# Patient Record
Sex: Female | Born: 1950
Health system: Southern US, Community
[De-identification: ages and names within clinical notes are randomized; demographics above are authoritative.]

## PROBLEM LIST (undated history)

## (undated) DIAGNOSIS — D126 Benign neoplasm of colon, unspecified: Secondary | ICD-10-CM

## (undated) DIAGNOSIS — K449 Diaphragmatic hernia without obstruction or gangrene: Secondary | ICD-10-CM

## (undated) DIAGNOSIS — R200 Anesthesia of skin: Secondary | ICD-10-CM

## (undated) DIAGNOSIS — M25559 Pain in unspecified hip: Secondary | ICD-10-CM

## (undated) DIAGNOSIS — M797 Fibromyalgia: Secondary | ICD-10-CM

## (undated) DIAGNOSIS — M79606 Pain in leg, unspecified: Secondary | ICD-10-CM

## (undated) DIAGNOSIS — G4730 Sleep apnea, unspecified: Secondary | ICD-10-CM

## (undated) DIAGNOSIS — I4891 Unspecified atrial fibrillation: Secondary | ICD-10-CM

## (undated) DIAGNOSIS — K5900 Constipation, unspecified: Secondary | ICD-10-CM

## (undated) DIAGNOSIS — K219 Gastro-esophageal reflux disease without esophagitis: Secondary | ICD-10-CM

## (undated) DIAGNOSIS — K829 Disease of gallbladder, unspecified: Secondary | ICD-10-CM

## (undated) DIAGNOSIS — D509 Iron deficiency anemia, unspecified: Principal | ICD-10-CM

## (undated) DIAGNOSIS — K5790 Diverticulosis of intestine, part unspecified, without perforation or abscess without bleeding: Secondary | ICD-10-CM

## (undated) DIAGNOSIS — H401290 Low-tension glaucoma, unspecified eye, stage unspecified: Secondary | ICD-10-CM

## (undated) DIAGNOSIS — N83209 Unspecified ovarian cyst, unspecified side: Secondary | ICD-10-CM

## (undated) DIAGNOSIS — K644 Residual hemorrhoidal skin tags: Secondary | ICD-10-CM

## (undated) DIAGNOSIS — R7303 Prediabetes: Secondary | ICD-10-CM

## (undated) DIAGNOSIS — K649 Unspecified hemorrhoids: Secondary | ICD-10-CM

## (undated) DIAGNOSIS — M1990 Unspecified osteoarthritis, unspecified site: Secondary | ICD-10-CM

## (undated) DIAGNOSIS — G5790 Unspecified mononeuropathy of unspecified lower limb: Secondary | ICD-10-CM

## (undated) DIAGNOSIS — H409 Unspecified glaucoma: Secondary | ICD-10-CM

## (undated) DIAGNOSIS — M549 Dorsalgia, unspecified: Secondary | ICD-10-CM

## (undated) DIAGNOSIS — I499 Cardiac arrhythmia, unspecified: Secondary | ICD-10-CM

## (undated) DIAGNOSIS — K589 Irritable bowel syndrome without diarrhea: Secondary | ICD-10-CM

## (undated) DIAGNOSIS — R600 Localized edema: Secondary | ICD-10-CM

## (undated) HISTORY — DX: Unspecified ovarian cyst, unspecified side: N83.209

## (undated) HISTORY — PX: PARTIAL HYSTERECTOMY: SHX80

## (undated) HISTORY — DX: Unspecified hemorrhoids: K64.9

## (undated) HISTORY — DX: Constipation, unspecified: K59.00

## (undated) HISTORY — DX: Iron deficiency anemia, unspecified: D50.9

## (undated) HISTORY — DX: Unspecified mononeuropathy of unspecified lower limb: G57.90

## (undated) HISTORY — DX: Disease of gallbladder, unspecified: K82.9

## (undated) HISTORY — PX: OTHER SURGICAL HISTORY: SHX169

## (undated) HISTORY — PX: OOPHORECTOMY: SHX86

## (undated) HISTORY — DX: Cardiac arrhythmia, unspecified: I49.9

## (undated) HISTORY — DX: Dorsalgia, unspecified: M54.9

## (undated) HISTORY — DX: Diverticulosis of intestine, part unspecified, without perforation or abscess without bleeding: K57.90

## (undated) HISTORY — PX: KNEE SURGERY: SHX244

## (undated) HISTORY — DX: Pain in unspecified hip: M25.559

## (undated) HISTORY — DX: Pain in leg, unspecified: M79.606

## (undated) HISTORY — DX: Gastro-esophageal reflux disease without esophagitis: K21.9

## (undated) HISTORY — DX: Unspecified atrial fibrillation: I48.91

## (undated) HISTORY — DX: Diaphragmatic hernia without obstruction or gangrene: K44.9

## (undated) HISTORY — DX: Benign neoplasm of colon, unspecified: D12.6

## (undated) HISTORY — DX: Irritable bowel syndrome, unspecified: K58.9

## (undated) HISTORY — DX: Unspecified osteoarthritis, unspecified site: M19.90

## (undated) HISTORY — DX: Localized edema: R60.0

## (undated) HISTORY — DX: Anesthesia of skin: R20.0

## (undated) HISTORY — DX: Prediabetes: R73.03

## (undated) HISTORY — PX: CARPAL TUNNEL RELEASE: SHX101

## (undated) HISTORY — PX: CHOLECYSTECTOMY: SHX55

## (undated) HISTORY — DX: Unspecified glaucoma: H40.9

## (undated) HISTORY — DX: Residual hemorrhoidal skin tags: K64.4

## (undated) HISTORY — DX: Fibromyalgia: M79.7

## (undated) HISTORY — PX: CATARACT EXTRACTION: SUR2

---

## 1997-07-11 ENCOUNTER — Ambulatory Visit (HOSPITAL_COMMUNITY): Admission: RE | Admit: 1997-07-11 | Discharge: 1997-07-11 | Payer: Self-pay | Admitting: Neurosurgery

## 1997-10-05 ENCOUNTER — Ambulatory Visit (HOSPITAL_COMMUNITY): Admission: RE | Admit: 1997-10-05 | Discharge: 1997-10-05 | Payer: Self-pay | Admitting: Neurosurgery

## 2000-08-24 ENCOUNTER — Ambulatory Visit (HOSPITAL_COMMUNITY): Admission: RE | Admit: 2000-08-24 | Discharge: 2000-08-24 | Payer: Self-pay | Admitting: Internal Medicine

## 2000-11-19 ENCOUNTER — Emergency Department (HOSPITAL_COMMUNITY): Admission: EM | Admit: 2000-11-19 | Discharge: 2000-11-20 | Payer: Self-pay | Admitting: Emergency Medicine

## 2000-11-20 ENCOUNTER — Encounter: Payer: Self-pay | Admitting: Emergency Medicine

## 2000-11-20 DIAGNOSIS — N83209 Unspecified ovarian cyst, unspecified side: Secondary | ICD-10-CM

## 2000-11-20 HISTORY — DX: Unspecified ovarian cyst, unspecified side: N83.209

## 2001-03-06 ENCOUNTER — Ambulatory Visit: Admission: RE | Admit: 2001-03-06 | Discharge: 2001-03-06 | Payer: Self-pay | Admitting: Gynecology

## 2001-03-08 ENCOUNTER — Encounter: Payer: Self-pay | Admitting: Gynecology

## 2001-03-12 ENCOUNTER — Ambulatory Visit (HOSPITAL_COMMUNITY): Admission: RE | Admit: 2001-03-12 | Discharge: 2001-03-12 | Payer: Self-pay | Admitting: Gynecology

## 2001-03-12 ENCOUNTER — Encounter (INDEPENDENT_AMBULATORY_CARE_PROVIDER_SITE_OTHER): Payer: Self-pay | Admitting: Specialist

## 2001-08-15 ENCOUNTER — Encounter: Payer: Self-pay | Admitting: Internal Medicine

## 2001-08-15 ENCOUNTER — Ambulatory Visit (HOSPITAL_COMMUNITY): Admission: RE | Admit: 2001-08-15 | Discharge: 2001-08-15 | Payer: Self-pay | Admitting: Internal Medicine

## 2001-08-28 ENCOUNTER — Ambulatory Visit: Admission: RE | Admit: 2001-08-28 | Discharge: 2001-08-28 | Payer: Self-pay | Admitting: Internal Medicine

## 2001-12-02 ENCOUNTER — Ambulatory Visit: Admission: RE | Admit: 2001-12-02 | Discharge: 2001-12-02 | Payer: Self-pay | Admitting: Internal Medicine

## 2003-02-28 HISTORY — PX: COLONOSCOPY: SHX174

## 2003-03-13 ENCOUNTER — Ambulatory Visit (HOSPITAL_COMMUNITY): Admission: RE | Admit: 2003-03-13 | Discharge: 2003-03-13 | Payer: Self-pay | Admitting: Internal Medicine

## 2003-03-17 ENCOUNTER — Ambulatory Visit (HOSPITAL_COMMUNITY): Admission: RE | Admit: 2003-03-17 | Discharge: 2003-03-17 | Payer: Self-pay | Admitting: Otolaryngology

## 2003-06-26 ENCOUNTER — Emergency Department (HOSPITAL_COMMUNITY): Admission: EM | Admit: 2003-06-26 | Discharge: 2003-06-27 | Payer: Self-pay | Admitting: *Deleted

## 2003-08-14 ENCOUNTER — Ambulatory Visit (HOSPITAL_COMMUNITY)
Admission: RE | Admit: 2003-08-14 | Discharge: 2003-08-14 | Payer: Self-pay | Admitting: Physical Medicine and Rehabilitation

## 2003-10-21 ENCOUNTER — Ambulatory Visit (HOSPITAL_COMMUNITY): Admission: RE | Admit: 2003-10-21 | Discharge: 2003-10-21 | Payer: Self-pay | Admitting: Internal Medicine

## 2004-10-13 ENCOUNTER — Other Ambulatory Visit: Admission: RE | Admit: 2004-10-13 | Discharge: 2004-10-13 | Payer: Self-pay | Admitting: Dermatology

## 2005-01-16 ENCOUNTER — Other Ambulatory Visit: Admission: RE | Admit: 2005-01-16 | Discharge: 2005-01-16 | Payer: Self-pay | Admitting: Dermatology

## 2005-07-21 ENCOUNTER — Ambulatory Visit (HOSPITAL_COMMUNITY): Admission: RE | Admit: 2005-07-21 | Discharge: 2005-07-21 | Payer: Self-pay | Admitting: Internal Medicine

## 2005-10-09 ENCOUNTER — Ambulatory Visit (HOSPITAL_COMMUNITY): Admission: RE | Admit: 2005-10-09 | Discharge: 2005-10-09 | Payer: Self-pay | Admitting: *Deleted

## 2006-02-06 ENCOUNTER — Ambulatory Visit (HOSPITAL_COMMUNITY): Admission: RE | Admit: 2006-02-06 | Discharge: 2006-02-06 | Payer: Self-pay | Admitting: Internal Medicine

## 2006-04-16 ENCOUNTER — Ambulatory Visit (HOSPITAL_COMMUNITY): Admission: RE | Admit: 2006-04-16 | Discharge: 2006-04-16 | Payer: Self-pay | Admitting: Neurology

## 2008-02-28 HISTORY — PX: ESOPHAGOGASTRODUODENOSCOPY: SHX1529

## 2008-02-28 HISTORY — PX: COLONOSCOPY: SHX174

## 2008-03-04 ENCOUNTER — Ambulatory Visit (HOSPITAL_COMMUNITY): Admission: RE | Admit: 2008-03-04 | Discharge: 2008-03-04 | Payer: Self-pay | Admitting: Internal Medicine

## 2008-09-17 ENCOUNTER — Ambulatory Visit (HOSPITAL_COMMUNITY): Admission: RE | Admit: 2008-09-17 | Discharge: 2008-09-17 | Payer: Self-pay | Admitting: Internal Medicine

## 2008-12-25 DIAGNOSIS — Z8711 Personal history of peptic ulcer disease: Secondary | ICD-10-CM | POA: Insufficient documentation

## 2008-12-25 DIAGNOSIS — M129 Arthropathy, unspecified: Secondary | ICD-10-CM | POA: Insufficient documentation

## 2008-12-25 DIAGNOSIS — K219 Gastro-esophageal reflux disease without esophagitis: Secondary | ICD-10-CM | POA: Insufficient documentation

## 2008-12-25 DIAGNOSIS — M545 Low back pain, unspecified: Secondary | ICD-10-CM | POA: Insufficient documentation

## 2008-12-25 DIAGNOSIS — R1310 Dysphagia, unspecified: Secondary | ICD-10-CM | POA: Insufficient documentation

## 2008-12-28 ENCOUNTER — Ambulatory Visit: Payer: Self-pay | Admitting: Gastroenterology

## 2008-12-28 ENCOUNTER — Encounter: Payer: Self-pay | Admitting: Internal Medicine

## 2008-12-28 DIAGNOSIS — Z860100 Personal history of colon polyps, unspecified: Secondary | ICD-10-CM | POA: Insufficient documentation

## 2008-12-28 DIAGNOSIS — K921 Melena: Secondary | ICD-10-CM | POA: Insufficient documentation

## 2008-12-28 DIAGNOSIS — R1032 Left lower quadrant pain: Secondary | ICD-10-CM | POA: Insufficient documentation

## 2009-01-08 ENCOUNTER — Ambulatory Visit: Payer: Self-pay | Admitting: Internal Medicine

## 2009-01-08 ENCOUNTER — Ambulatory Visit (HOSPITAL_COMMUNITY): Admission: RE | Admit: 2009-01-08 | Discharge: 2009-01-08 | Payer: Self-pay | Admitting: Internal Medicine

## 2009-01-11 ENCOUNTER — Ambulatory Visit (HOSPITAL_COMMUNITY): Admission: RE | Admit: 2009-01-11 | Discharge: 2009-01-11 | Payer: Self-pay | Admitting: Internal Medicine

## 2009-03-03 ENCOUNTER — Encounter: Payer: Self-pay | Admitting: Internal Medicine

## 2009-10-21 ENCOUNTER — Ambulatory Visit: Payer: Self-pay | Admitting: Otolaryngology

## 2009-12-02 ENCOUNTER — Ambulatory Visit: Payer: Self-pay | Admitting: Otolaryngology

## 2010-03-20 ENCOUNTER — Encounter: Payer: Self-pay | Admitting: Internal Medicine

## 2010-03-29 NOTE — Miscellaneous (Signed)
Summary: Orders Update  Clinical Lists Changes  Problems: Added new problem of SCREENING FOR GENETIC DISEASE CARRIER STATUS (ICD-V82.71) Orders: Added new Test order of T-Hemochromatosis [phone]) - Signed  Appended Document: Orders Update Pt aware, order faxed to [facility].

## 2010-04-12 ENCOUNTER — Encounter (HOSPITAL_COMMUNITY): Payer: 59

## 2010-04-12 ENCOUNTER — Other Ambulatory Visit: Payer: Self-pay | Admitting: Ophthalmology

## 2010-04-12 DIAGNOSIS — Z01812 Encounter for preprocedural laboratory examination: Secondary | ICD-10-CM | POA: Insufficient documentation

## 2010-04-12 DIAGNOSIS — Z01810 Encounter for preprocedural cardiovascular examination: Secondary | ICD-10-CM | POA: Insufficient documentation

## 2010-04-12 LAB — BASIC METABOLIC PANEL
BUN: 11 mg/dL (ref 6–23)
CO2: 28 mEq/L (ref 19–32)
Chloride: 102 mEq/L (ref 96–112)
Creatinine, Ser: 0.55 mg/dL (ref 0.4–1.2)

## 2010-04-12 LAB — HEMOGLOBIN AND HEMATOCRIT, BLOOD: HCT: 39.5 % (ref 36.0–46.0)

## 2010-04-18 ENCOUNTER — Ambulatory Visit (HOSPITAL_COMMUNITY)
Admission: RE | Admit: 2010-04-18 | Discharge: 2010-04-18 | Disposition: A | Discharge status: Home / Self Care | Payer: 59 | Source: Ambulatory Visit | Attending: Ophthalmology | Admitting: Ophthalmology

## 2010-04-18 DIAGNOSIS — H2510 Age-related nuclear cataract, unspecified eye: Secondary | ICD-10-CM | POA: Insufficient documentation

## 2010-04-18 DIAGNOSIS — E119 Type 2 diabetes mellitus without complications: Secondary | ICD-10-CM | POA: Insufficient documentation

## 2010-04-18 DIAGNOSIS — Z01812 Encounter for preprocedural laboratory examination: Secondary | ICD-10-CM | POA: Insufficient documentation

## 2010-04-18 LAB — GLUCOSE, CAPILLARY: Glucose-Capillary: 112 mg/dL — ABNORMAL HIGH (ref 70–99)

## 2010-06-01 LAB — POCT I-STAT, CHEM 8
BUN: 9 mg/dL (ref 6–23)
Calcium, Ion: 0.86 mmol/L — ABNORMAL LOW (ref 1.12–1.32)
Creatinine, Ser: 0.7 mg/dL (ref 0.4–1.2)
TCO2: 20 mmol/L (ref 0–100)

## 2010-06-28 ENCOUNTER — Encounter (HOSPITAL_COMMUNITY): Payer: 59

## 2010-06-28 ENCOUNTER — Other Ambulatory Visit: Payer: Self-pay | Admitting: Ophthalmology

## 2010-06-28 LAB — BASIC METABOLIC PANEL
BUN: 12 mg/dL (ref 6–23)
Chloride: 104 mEq/L (ref 96–112)
Glucose, Bld: 125 mg/dL — ABNORMAL HIGH (ref 70–99)
Potassium: 4.1 mEq/L (ref 3.5–5.1)

## 2010-07-04 ENCOUNTER — Ambulatory Visit (HOSPITAL_COMMUNITY)
Admission: RE | Admit: 2010-07-04 | Discharge: 2010-07-04 | Disposition: A | Discharge status: Home / Self Care | Payer: 59 | Source: Ambulatory Visit | Attending: Ophthalmology | Admitting: Ophthalmology

## 2010-07-04 DIAGNOSIS — H2510 Age-related nuclear cataract, unspecified eye: Secondary | ICD-10-CM | POA: Insufficient documentation

## 2010-07-04 DIAGNOSIS — E119 Type 2 diabetes mellitus without complications: Secondary | ICD-10-CM | POA: Insufficient documentation

## 2010-07-15 NOTE — Op Note (Signed)
[facility]  Patient:    Debra Harris, Debra Harris Visit Number: [id number] MRN: [id number]          Service Type: DSU Location: DAY Attending Physician:  Jeannette Corpus Dictated by:   Rande Brunt. Clarke-Pearson, M.D. Proc. Date: 03/12/01 Admit Date:  03/12/2001   CC:         Telford Nab, R.N.  Roseanna Rainbow, M.D.  Christin Bach, M.D., [address], [address], [place], [place]   Operative Report  PREOPERATIVE DIAGNOSIS:  Complex left adnexal mass.  POSTOPERATIVE DIAGNOSIS:  Benign ovarian cyst.  SURGICAL PROCEDURE:  Laparoscopic bilateral salpingo-oophorectomy.  SURGEON:  Daniel L. Clarke-Pearson, M.D.  ASSISTANT:  Roseanna Rainbow, M.D.  ANESTHESIA:  General with orotracheal tube.  ESTIMATED BLOOD LOSS:  10 cc.  SURGICAL FINDINGS:  At diagnostic laparoscopic, the patient had a cystic lesion arising from the left ovary.  There were adhesions of the left ovary to the pelvic sidewall and sigmoid colon.  The right ovary appeared relatively normal except for some adhesions around it, too.  The upper abdomen was normal, including the liver, diaphragm, spleen, and stomach.  DESCRIPTION OF PROCEDURE:  The patient was brought to the operating room and after satisfactory attainment of general anesthesia, was placed in a modified lithotomy position in Huntley stirrups.  The anterior abdominal wall, perineum, and vagina were prepped with Betadine.  A Foley catheter was placed, and a sponge stick was placed in the vagina.  The patient was draped.  The abdomen was entered through an open technique using a subumbilical incision.  A Hasson cannula was placed in the peritoneal cavity and the abdominal cavity insufflated with carbon dioxide.  Additional ports were placed.  A 5 mm port was placed just lateral to the inferior epigastric vessels, and a suprapubic port was also placed.  The patient was placed in steep Trendelenburg.  The upper  abdomen and pelvis were explored with the above-noted findings.  The retroperitoneal space on the left side of the pelvis was opened, identifying the external iliac artery, vein, and ureter.  The adhesions were lysed from the colon adherent to the ovary and sidewall.  The infundibulopelvic ligament was isolated and divided with staples.  The peritoneal attachments of the ovary were freed up further at the same time, protecting the ureter.  The ovary was then placed in an EndoCatch bag, deflated within the bag, and removed from the peritoneal cavity.  This was submitted to frozen section, revealing benign ovarian cyst.  Attention was turned to the right side of the pelvis where a similar procedure was carried out.  With both ovaries having been removed, the pelvis was irrigated and hemostasis achieved with additional cautery.  A Grice needle with a #0 Vicryl suture was used to close the fascia and peritoneum of the suprapubic port site.  The other ports were removed under direct visualization.  Subcuticular sutures were taken to close the skin.  The umbilical incision was closed in layers, including the fascia, with figure-of-eight sutures using 0 PDS.  Steri-Strips were applied.  Dressing was applied.  The patient was awakened from anesthesia and taken to the recovery room in satisfactory condition.  Sponge, needle, and instrument counts correct x 2. Dictated by:   Rande Brunt. Clarke-Pearson, M.D. Attending Physician:  Jeannette Corpus DD:  03/12/01 TD:  03/12/01 Job: [id number]
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
## 2010-07-15 NOTE — Op Note (Signed)
Debra Harris, Debra Harris                        ACCOUNT NO.:  [id number]   MEDICAL RECORD NO.:  [id number]                   PATIENT TYPE:  AMB   LOCATION:  DAY                                  FACILITY:  [facility]   PHYSICIAN:  Lionel December, M.D.                 DATE OF BIRTH:  January 12, 1951   DATE OF PROCEDURE:  10/21/2003  DATE OF DISCHARGE:                                 OPERATIVE REPORT   PROCEDURE:  Total colonoscopy.   INDICATIONS:  Ms. Olkowski is a 60 year old Caucasian female who had an  episode of rectal bleeding a few months ago when she passed lots of dark  blood. Since then, she has had intermittent hematochezia which is small in  amount. She has irregular bowel movements but does not get constipated. At  times, she has urgency. Family history is negative for colorectal carcinoma.  Her last colonoscopy was in June 1999. Procedure and risks were reviewed  with the patient and informed consent was obtained.   PREOPERATIVE MEDICATIONS:  Demerol 50 mg IV, Versed 5 mg IV in divided  doses.   FINDINGS:  Procedure performed in endoscopy suite. The patient's vital signs  and O2 saturations were monitored during procedure and remained stable. The  patient was placed in the left lateral position and rectal examination  performed. No abnormality noted on external or digital exam. Olympus video  scope was placed in the rectum and advanced into sigmoid colon and beyond.  Preparation was satisfactory. She had some thick liquid stool coating the  mucosa at the ascending colon which was washed away. Cecal landmarks,  appendiceal orifice and ileocecal valve, were well seen and photographed for  the record. As the cope was withdrawn, colonic mucosa was carefully  examined. There was a 3-mm polyp at hepatic flexure which was ablated by  cold biopsy. There was a tiny single diverticulum at the sigmoid colon.  Rectal mucosa was normal. Scope was retroflexed to examine anorectal  junction, and  small hemorrhoids were noted above the dentate line. The scope  was straightened and withdrawn. The patient tolerated the procedure well.   FINAL DIAGNOSES:  1. Small external hemorrhoids felt to be the source of intermittent     hematochezia.  2. Single tiny diverticulum at sigmoid colon.  3. Small polyp ablated by cold biopsy from hepatic flexure.   RECOMMENDATIONS:  1. High fiber diet.  2. I will be contacting the patient with biopsy results and further     recommendations. I would like to keep written record as to the frequency     of these episodes. If she has another episode where she passes a large     amount of blood per rectum, may consider urgent sigmoidoscopy. Hopefully,     this would not be needed.      ___________________________________________  Lionel December, M.D.   NR/MEDQ  D:  10/21/2003  T:  10/21/2003  Job:  [id number]   cc:   Kingsley Callander. Ouida Sills, M.D.  [address]  [place]  [place] [address]  Fax: [phone]

## 2010-11-24 ENCOUNTER — Ambulatory Visit: Payer: 59 | Attending: Internal Medicine | Admitting: Sleep Medicine

## 2010-11-24 DIAGNOSIS — Z6831 Body mass index (BMI) 31.0-31.9, adult: Secondary | ICD-10-CM | POA: Insufficient documentation

## 2010-11-24 DIAGNOSIS — G4733 Obstructive sleep apnea (adult) (pediatric): Secondary | ICD-10-CM | POA: Insufficient documentation

## 2010-11-24 DIAGNOSIS — G4730 Sleep apnea, unspecified: Secondary | ICD-10-CM

## 2010-11-25 ENCOUNTER — Encounter: Payer: Self-pay | Admitting: Sleep Medicine

## 2010-11-28 NOTE — Procedures (Signed)
NAMECHRISSY, Debra Harris              ACCOUNT NO.:  [id number]  MEDICAL RECORD NO.:  [id number]          PATIENT TYPE:  OUT  LOCATION:  SLEEP LAB                     FACILITY:  [facility]  PHYSICIAN:  Raiza Kiesel A. Gerilyn Pilgrim, M.D. DATE OF BIRTH:  1950-12-26  DATE OF STUDY:  11/28/2010                           NOCTURNAL POLYSOMNOGRAM  REFERRING PHYSICIAN:  Kingsley Callander. Ouida Sills, MD  REFERRING PHYSICIAN:  Kingsley Callander. Ouida Sills, MD  INDICATION FOR STUDY:  A 60 year old who has a known history of sleep apnea diagnosed many years ago who presents with witnessed apnea, fatigue, and fragmented sleep.  EPWORTH SLEEPINESS SCORE: 1. BMI 31.  MEDICATIONS:  Metformin.  SLEEP ARCHITECTURE:  This is a split-night recording with initial portion being diagnostic and the second portion a titration recording. The total recording time is 404 minutes, sleep efficiency 78.1%, sleep latency 7 minutes, REM latency 97 minutes.  RESPIRATORY DATA:  Baseline oxygen saturation is 97, lowest saturation 81 during the REM sleep.  Diagnostic AHI is 68.6.  The patient was started on positive pressure of sleep time between 5 and 13.  Optimal pressure is 13 with resolution of obstructive events and good tolerance.  MOVEMENT-PARASOMNIA:  PLM index 0.  ELECTROCARDIOGRAM SUMMARY:  Average heart rate is 69 with no significant dysrhythmias observed.  IMPRESSIONS-RECOMMENDATIONS:  Severe obstructive sleep apnea syndrome, which responds well to a continuous positive airway pressure of 13.     Sharief Wainwright A. Gerilyn Pilgrim, M.D.    KAD/MEDQ  D:  11/28/2010 08:54:49  T:  11/28/2010 11:17:34  Job:  [id number]

## 2010-12-28 ENCOUNTER — Other Ambulatory Visit: Payer: Self-pay | Admitting: General Surgery

## 2010-12-28 ENCOUNTER — Other Ambulatory Visit (HOSPITAL_COMMUNITY)
Admission: RE | Admit: 2010-12-28 | Discharge: 2010-12-28 | Disposition: A | Discharge status: Home / Self Care | Payer: 59 | Source: Ambulatory Visit | Attending: General Surgery | Admitting: General Surgery

## 2010-12-28 DIAGNOSIS — D235 Other benign neoplasm of skin of trunk: Secondary | ICD-10-CM | POA: Insufficient documentation

## 2010-12-29 NOTE — Consult Note (Signed)
NAME:  Debra Harris, Debra Harris                   ACCOUNT NO.:  MEDICAL RECORD NO.:  [id number]  LOCATION:                                 FACILITY:  PHYSICIAN:  Barbaraann Barthel, M.D. DATE OF BIRTH:  07/23/1950  DATE OF CONSULTATION:  12/28/2010 DATE OF DISCHARGE:                                CONSULTATION   ADDRESS:  Teola Bradley, [place] [address], [place] [address].  BODY:  Dear Deniece Portela:  I saw your wife in my office on December 28, 2010.  As you know, she gave the history of having for the last couple of days some bright red rectal bleeding.  This was noted mostly on the toilet tissue and some dripping on the periphery of her anal rim.  Past medical history is unremarkable for any bleeding problems per se. She remembers her last colonoscopy to be by Dr. Jena Gauss sometime in she believes 2007.  She has no history of liver disease or any history of inflammatory bowel disease, or carcinoma of the colon in her family.  Examination of her anal rectal area showed that she had no perianal dermatitis or any thrombosed external hemorrhoids.  She did have some external tags which were not significant.  Digital examination, there was moderate sphincter tone.  There were no masses palpated.  Stool was actually guaiac-negative.  On anoscopy, there were some external hemorrhoids.  These were not bleeding.  I see areas of serpiginous small vessels that may have been bleeding in the past and she had some prominent internal hemorrhoids grade 2.  No fissures and no fistulas were identified.  Rest of her examination was grossly within normal limits.  She did have a pigmented lesion on her back which I removed with an excisional biopsy and I will make sure you get a copy of the pathology reports for your records.  Suggestion.  I suggested that she stay off of her Aleve and aspirin at this point, and we can probably take care of her internal hemorrhoids at our clinic in [place] and she can  schedule that at her convenience.  I also referred to our web page for more information on regard to that.  Several things she needs is to continue to drink more water and not spend a great deal of time sitting on the toilet and reading as this leads to a venous dilatation and problems with internal hemorrhoids. She also gave a history of some straining at times and I suggest a stool softener on a p.r.n. basis essentially.  I have also told her to keep up with her schedule with the GI service regarding her regular colonoscopy exams.  My anoscopy exam revealed the area of bleeding is in the most likely internal hemorrhoids.  There was no sign of any blood coming above the dentate line.  Thank you for the opportunity of taking care of Debra Harris as always it is an honor to care for a colleagues family.     Barbaraann Barthel, M.D.     WB/MEDQ  D:  12/28/2010  T:  12/28/2010  Job:  [id number]

## 2010-12-30 ENCOUNTER — Encounter: Payer: Self-pay | Admitting: *Deleted

## 2011-01-02 ENCOUNTER — Encounter: Payer: Self-pay | Admitting: Pulmonary Disease

## 2011-01-02 ENCOUNTER — Ambulatory Visit (INDEPENDENT_AMBULATORY_CARE_PROVIDER_SITE_OTHER): Payer: 59 | Admitting: Pulmonary Disease

## 2011-01-02 VITALS — BP 126/72 | HR 73 | Temp 98.4°F | Ht 66.5 in | Wt 205.0 lb

## 2011-01-02 DIAGNOSIS — G4733 Obstructive sleep apnea (adult) (pediatric): Secondary | ICD-10-CM

## 2011-01-02 NOTE — Patient Instructions (Signed)
Will set you up on cpap at moderate pressure, and see you back in 6weeks. Please call if having tolerance issues Work on weight loss

## 2011-01-02 NOTE — Progress Notes (Signed)
  Subjective:    Patient ID: Debra Harris, female    DOB: 01/19/1951, 60 y.o.   MRN: [id number]  HPI The patient is a 60 year old female who I've been asked to see for management of obstructive sleep apnea.  She was initially diagnosed approximately 10 years ago, and quit using CPAP after a period of time.  She recently underwent a followup study in September of this year which showed severe sleep apnea with an AHI of 69 events per hour.  She was titrated on CPAP to an optimal level 13 cm of water pressure.  The patient states that she does have loud snoring, but no one has ever commented on an abnormal breathing pattern during sleep.  She has not rested in the mornings upon arising, and does note significant inappropriate daytime sleepiness with periods of inactivity.  She also notes sleepiness with driving home in the late afternoon at times.  The patient states that her weight is neutral over the last few years.  Sleep Questionnaire: What time do you typically go to bed?( Between what hours) 11 to 11:30 How long does it take you to fall asleep? 15 to 30 mins How many times during the night do you wake up? 6 What time do you get out of bed to start your day? 0630 Do you drive or operate heavy machinery in your occupation? No How much has your weight changed (up or down) over the past two years? (In pounds) 0 oz (0 kg) Have you ever had a sleep study before? Yes If yes, location of study? Dr. Ronal Fear office If yes, date of study? 10/2010 Do you currently use CPAP? No Do you wear oxygen at any time?     Review of Systems  Constitutional: Negative for fever and unexpected weight change.  HENT: Positive for congestion, trouble swallowing and dental problem. Negative for ear pain, nosebleeds, sore throat, rhinorrhea, sneezing, postnasal drip and sinus pressure.   Eyes: Negative for redness and itching.  Respiratory: Positive for cough and shortness of breath. Negative for chest tightness and wheezing.    Cardiovascular: Positive for leg swelling. Negative for palpitations.  Gastrointestinal: Positive for abdominal pain. Negative for nausea and vomiting.  Genitourinary: Negative for dysuria.  Musculoskeletal: Positive for joint swelling.  Skin: Negative for rash.  Neurological: Negative for headaches.  Hematological: Does not bruise/bleed easily.  Psychiatric/Behavioral: Negative for dysphoric mood. The patient is not nervous/anxious.        Objective:   Physical Exam Constitutional:  Obese female, no acute distress  HENT:  Nares patent without discharge  Oropharynx without exudate, palate elongated, uvula normal  Eyes:  Perrla, eomi, no scleral icterus  Neck:  No JVD, no TMG  Cardiovascular:  Normal rate, regular rhythm, no rubs or gallops.  No murmurs        Intact distal pulses  Pulmonary :  Normal breath sounds, no stridor or respiratory distress   No rales, rhonchi, or wheezing  Abdominal:  Soft, nondistended, bowel sounds present.  No tenderness noted.   Musculoskeletal:  1+ lower extremity edema noted.  Lymph Nodes:  No cervical lymphadenopathy noted  Skin:  No cyanosis noted  Neurologic:  Alert, appropriate, moves all 4 extremities without obvious deficit.         Assessment & Plan:

## 2011-01-02 NOTE — Assessment & Plan Note (Signed)
The patient has documented severe obstructive sleep apnea by a recent sleep study, and had an excellent response to CPAP.  Will go ahead and restart her CPAP at a moderate pressure initially, and gradually increase as she becomes desensitized.  I have also encouraged her to work aggressively on weight loss.  She will followup in 6 weeks to check on her progress.

## 2011-02-13 ENCOUNTER — Ambulatory Visit (INDEPENDENT_AMBULATORY_CARE_PROVIDER_SITE_OTHER): Payer: 59 | Admitting: Pulmonary Disease

## 2011-02-13 ENCOUNTER — Encounter: Payer: Self-pay | Admitting: Pulmonary Disease

## 2011-02-13 VITALS — BP 122/72 | HR 62 | Temp 98.4°F | Ht 66.5 in

## 2011-02-13 DIAGNOSIS — G4733 Obstructive sleep apnea (adult) (pediatric): Secondary | ICD-10-CM

## 2011-02-13 NOTE — Assessment & Plan Note (Signed)
The patient is much improved since starting on CPAP.  She is sleeping much better, is not having breakthrough snoring, and is much more rested and alert during the day.  She is not having mask or pressure issues, but she is having dryness issues.  It is unclear whether this is related to humidity, medications, or whether the pressure may be going into her eyes via the tear duct.  I have asked her to play with the humidity level, and to try eyedrops and nasal saline at h.s. To see if this helps.  She is to call me if she continues to have issues.  We'll need to increase her pressure level to be optimized pressure of 13 cm, and I've encouraged her to work aggressively on weight loss.

## 2011-02-13 NOTE — Patient Instructions (Addendum)
Will increase pressure to 13cm which is your optimal pressure Work on weight loss Try moisturizing drops for eyes at night, and saline spray for nose.  If you continue to have dryness issues, let me know.  followup with me in 6mos

## 2011-02-13 NOTE — Progress Notes (Signed)
  Subjective:    Patient ID: Debra Harris, female    DOB: 01/08/1951, 60 y.o.   MRN: [id number]  HPI The patient comes in today for followup of her severe obstructive sleep apnea.  She was started on CPAP at the last visit, he has done very well with the device.  She denies any issues with the mask fit or pressure, and feels that she is sleeping much better.  She is also seen significant improvement in her daytime alertness.  Her only complaint today is that of nasal congestion when she first awakens in the morning, and also dry eyes.  She is trying to adjust the heater settings on her humidifier to vary the moisture level.  Her download today off the CPAP machine she has fantastic compliance, no significant mask leaks, and good control of her AHI.   Review of Systems  Constitutional: Negative for fever and unexpected weight change.  HENT: Positive for congestion, sore throat and postnasal drip. Negative for ear pain, nosebleeds, rhinorrhea, sneezing, trouble swallowing, dental problem and sinus pressure.   Eyes: Negative for redness and itching.  Respiratory: Positive for cough. Negative for chest tightness, shortness of breath and wheezing.   Cardiovascular: Negative for palpitations and leg swelling.  Gastrointestinal: Negative for nausea and vomiting.  Genitourinary: Negative for dysuria.  Musculoskeletal: Negative for joint swelling.  Skin: Negative for rash.  Neurological: Positive for headaches.  Hematological: Does not bruise/bleed easily.  Psychiatric/Behavioral: Negative for dysphoric mood. The patient is not nervous/anxious.        Objective:   Physical Exam Ow female in nad No skin breakdown or pressure necrosis from cpap mask LE without edema, no cyanosis noted. Alert, does not appear sleepy, moves all 4        Assessment & Plan:

## 2011-06-20 ENCOUNTER — Telehealth: Payer: Self-pay | Admitting: Pulmonary Disease

## 2011-06-20 DIAGNOSIS — G4733 Obstructive sleep apnea (adult) (pediatric): Secondary | ICD-10-CM

## 2011-06-20 NOTE — Telephone Encounter (Signed)
[id number] on both home and cell number.  Carron Curie, CMA

## 2011-06-20 NOTE — Telephone Encounter (Signed)
I spoke with the pt and she states she does not feel this is allergy related and states she is already on allergy meds. She requests that order be sent to DME to decrease pressure. Order placed. Pt is  Aware.Carron Curie, CMA

## 2011-06-20 NOTE — Telephone Encounter (Signed)
From what she is describing, it sounds like she is having a lot of allergies with probable postnasal drip.  That may be what is causing the strangling??  Would recommend zyrtec 10mg  at bedtime each night, and can increase heat on humidifier if she thinks she needs more moisture. If she wants to decrease the pressure, can do so, but that may not be the issue. Can send an order to dme to decrease pressure to 11-12 if she wishes to do so.

## 2011-06-20 NOTE — Telephone Encounter (Signed)
I spoke with the pt and she states she has been having problems with her cpap gradually since her pressure was changed to 13 in December. She states she has been having a lot of nasal congestion, eyes are swollen in AM, wakes up feeling like she is strangling, and is having a lot of daytime sleepiness again. Pt states she feels 13 is too much. She also states that she is leaving for [place] on Sunday and wants this taken care well before then so she can take cpap with her. Please advise. Carron Curie, CMA

## 2011-06-20 NOTE — Telephone Encounter (Signed)
LMOM for pt TCB 

## 2011-08-14 ENCOUNTER — Ambulatory Visit: Payer: 59 | Admitting: Pulmonary Disease

## 2011-08-21 ENCOUNTER — Ambulatory Visit: Payer: 59 | Admitting: Pulmonary Disease

## 2011-09-05 ENCOUNTER — Ambulatory Visit (INDEPENDENT_AMBULATORY_CARE_PROVIDER_SITE_OTHER): Payer: BC Managed Care – PPO | Admitting: Pulmonary Disease

## 2011-09-05 ENCOUNTER — Encounter: Payer: Self-pay | Admitting: Pulmonary Disease

## 2011-09-05 VITALS — BP 122/80 | HR 61 | Temp 98.7°F | Ht 63.0 in | Wt 196.6 lb

## 2011-09-05 DIAGNOSIS — G4733 Obstructive sleep apnea (adult) (pediatric): Secondary | ICD-10-CM

## 2011-09-05 NOTE — Assessment & Plan Note (Signed)
The patient has severe obstructive sleep apnea, and would greatly benefit from compliant CPAP use.  She is having ongoing issues with CPAP pressure and also the humidity, as well as significant nasal symptoms that have been a chronic problem for her over the years.  I have asked her to try Zyrtec instead of Benadryl for her allergy medication since it is not as drying, and also suggested trying Lloyd Huger med rinses.  I have also encouraged her to keep working on dehumidifier adjustments.  If she continues to have nasal symptoms, I would recommend seeing her otolaryngologist again.  Regarding her pressure, I had suggested that she go back on the automatic noted since it will lower her overall average pressure during the night.  However, she wishes to hold off on doing this.  I've also encouraged her to work aggressively on weight loss.

## 2011-09-05 NOTE — Progress Notes (Signed)
  Subjective:    Patient ID: Debra Harris, female    DOB: [birth year], 61 y.o.   MRN: [id number]  HPI Patient comes in today for followup of her known severe obstructive sleep apnea.  She continues to have issues with nasal congestion, "mucous membrane swelling ", as well as ongoing humidity issues.  She complains of dryness, but has been taking Benadryl as her allergy medication.  She states that she does turn up the heater settings on her humidifier, but then has to much moisture.  She is then unable to find a happy medium.  She is not willing to try nasal sprays because they "irritate her passages".  She is followed by otolaryngology, but has not seen them recently.  She is also concerned about the pressure, and feels that she cannot tolerate the level of 13 which has been optimized.  She currently is on 12, but still feels at times the level is too high.  She does note that when she is able to wear her machine compliantly, that her symptoms are greatly improved.   Review of Systems  Constitutional: Negative for fever and unexpected weight change.  HENT: Positive for congestion, rhinorrhea and postnasal drip. Negative for ear pain, nosebleeds, sore throat, sneezing, trouble swallowing, dental problem and sinus pressure.   Eyes: Positive for redness. Negative for itching.  Respiratory: Negative for cough, chest tightness, shortness of breath and wheezing.   Cardiovascular: Negative for palpitations and leg swelling.  Gastrointestinal: Negative for nausea and vomiting.  Genitourinary: Negative for dysuria.  Musculoskeletal: Negative for joint swelling.  Skin: Negative for rash.  Neurological: Negative for headaches.  Hematological: Does not bruise/bleed easily.  Psychiatric/Behavioral: Negative for dysphoric mood. The patient is not nervous/anxious.   All other systems reviewed and are negative.       Objective:   Physical Exam Overweight female in no acute distress Nose with no  purulence noted, deviated septum to the right with mild inflammatory changes, left fairly clear No skin breakdown or pressure necrosis from the CPAP mask Oropharynx clear Chest clear to auscultation Cardiac exam regular rate and rhythm Lower extremities without edema, no cyanosis Alert and oriented, moves all 4 extremities.       Assessment & Plan:

## 2011-09-05 NOTE — Patient Instructions (Addendum)
Stop your allergy EX, and try zyrtec 10mg  once a day (not as drying) Recommend having your cpap machine put back on automatic mode 5-12, but if you wish to stay on current pressure to see how you do, we can keep there.  Adjust your humidifier as tolerated for best level Try neilmed sinus rinses in am and pm to see if helps nasal symptoms.  If they continue, consider ENT followup Work on weight loss followup with me in one year if doing well, but call if you continue to have tolerance issues.

## 2012-02-28 HISTORY — PX: COLONOSCOPY: SHX174

## 2012-08-06 ENCOUNTER — Encounter: Payer: Self-pay | Admitting: Internal Medicine

## 2012-08-26 ENCOUNTER — Encounter: Payer: Self-pay | Admitting: *Deleted

## 2012-09-05 ENCOUNTER — Ambulatory Visit (INDEPENDENT_AMBULATORY_CARE_PROVIDER_SITE_OTHER): Payer: BC Managed Care – PPO | Admitting: Pulmonary Disease

## 2012-09-05 ENCOUNTER — Encounter: Payer: Self-pay | Admitting: Pulmonary Disease

## 2012-09-05 VITALS — BP 116/70 | HR 66 | Temp 98.0°F | Ht 66.0 in | Wt 221.4 lb

## 2012-09-05 DIAGNOSIS — G4733 Obstructive sleep apnea (adult) (pediatric): Secondary | ICD-10-CM

## 2012-09-05 NOTE — Patient Instructions (Addendum)
Will have your cpap machine set on 5-15cm on auto. Discuss with your DME or the manufacturer about getting a replacement mask. Work on weight loss followup with me in one year.

## 2012-09-05 NOTE — Assessment & Plan Note (Signed)
The patient is wearing CPAP compliantly, and states that she sleeps the whole night without disruption.  Despite this, she does not feel rested in the mornings upon arising, and I suspect this has nothing to do with her sleep apnea.  Her family does not show any significant breakthrough events.  I would like to change her pressure to a hot or range, and would also increase the highest pressure to 15 cm given her recent weight gain.  She is also using an old mass, and I stressed to her the importance of changing, and the issue with leaks.

## 2012-09-05 NOTE — Progress Notes (Signed)
  Subjective:    Patient ID: Debra Harris, female    DOB: 10-08-1950, 62 y.o.   MRN: [id number]  HPI The patient comes in today for followup of her obstructive sleep apnea.  She is wearing CPAP.  Currently by her download, however she does not feel rested in the mornings upon arising.  By her recent download her pressure is set on automatic at 11-12, and it is supposed to be on a broader range.  It should be noted the patient has gained significant weight since the last visit.  She does not have significant leaks on her download.   Review of Systems  Constitutional: Negative for fever and unexpected weight change.  HENT: Positive for congestion. Negative for ear pain, nosebleeds, sore throat, rhinorrhea, sneezing, trouble swallowing, dental problem, postnasal drip and sinus pressure.   Eyes: Negative for redness and itching.  Respiratory: Positive for shortness of breath. Negative for cough, chest tightness and wheezing.   Cardiovascular: Negative for palpitations and leg swelling.  Gastrointestinal: Negative for nausea and vomiting.  Genitourinary: Negative for dysuria.  Musculoskeletal: Negative for joint swelling.  Skin: Negative for rash.  Neurological: Negative for headaches.  Hematological: Does not bruise/bleed easily.  Psychiatric/Behavioral: Negative for dysphoric mood. The patient is not nervous/anxious.        Objective:   Physical Exam Obese female in no acute distress Nose without purulence or discharge noted No skin breakdown or pressure necrosis from the CPAP mask Neck without lymphadenopathy or thyromegaly Lower extremities without edema, no cyanosis Alert, does not appear to be sleepy, moves all 4 extremities.       Assessment & Plan:

## 2012-09-13 ENCOUNTER — Other Ambulatory Visit (HOSPITAL_COMMUNITY): Payer: Self-pay | Admitting: Internal Medicine

## 2012-09-13 DIAGNOSIS — Z139 Encounter for screening, unspecified: Secondary | ICD-10-CM

## 2012-09-17 ENCOUNTER — Ambulatory Visit (HOSPITAL_COMMUNITY): Payer: BC Managed Care – PPO

## 2012-09-26 ENCOUNTER — Ambulatory Visit (HOSPITAL_COMMUNITY)
Admission: RE | Admit: 2012-09-26 | Discharge: 2012-09-26 | Disposition: A | Discharge status: Home / Self Care | Payer: BC Managed Care – PPO | Source: Ambulatory Visit | Attending: Internal Medicine | Admitting: Internal Medicine

## 2012-09-26 DIAGNOSIS — Z139 Encounter for screening, unspecified: Secondary | ICD-10-CM

## 2012-09-26 DIAGNOSIS — Z1231 Encounter for screening mammogram for malignant neoplasm of breast: Secondary | ICD-10-CM | POA: Insufficient documentation

## 2012-10-08 ENCOUNTER — Other Ambulatory Visit (HOSPITAL_COMMUNITY): Payer: Self-pay | Admitting: Internal Medicine

## 2012-10-08 DIAGNOSIS — M5416 Radiculopathy, lumbar region: Secondary | ICD-10-CM

## 2012-10-11 ENCOUNTER — Ambulatory Visit (HOSPITAL_COMMUNITY)
Admission: RE | Admit: 2012-10-11 | Discharge: 2012-10-11 | Disposition: A | Discharge status: Home / Self Care | Payer: BC Managed Care – PPO | Source: Ambulatory Visit | Attending: Internal Medicine | Admitting: Internal Medicine

## 2012-10-11 DIAGNOSIS — M79609 Pain in unspecified limb: Secondary | ICD-10-CM | POA: Insufficient documentation

## 2012-10-11 DIAGNOSIS — M5416 Radiculopathy, lumbar region: Secondary | ICD-10-CM

## 2012-10-11 DIAGNOSIS — M51379 Other intervertebral disc degeneration, lumbosacral region without mention of lumbar back pain or lower extremity pain: Secondary | ICD-10-CM | POA: Insufficient documentation

## 2012-10-11 DIAGNOSIS — M5126 Other intervertebral disc displacement, lumbar region: Secondary | ICD-10-CM | POA: Insufficient documentation

## 2012-10-11 DIAGNOSIS — IMO0002 Reserved for concepts with insufficient information to code with codable children: Secondary | ICD-10-CM | POA: Insufficient documentation

## 2012-10-11 DIAGNOSIS — M5137 Other intervertebral disc degeneration, lumbosacral region: Secondary | ICD-10-CM | POA: Insufficient documentation

## 2012-10-11 LAB — POCT I-STAT, CHEM 8
Calcium, Ion: 1.17 mmol/L (ref 1.13–1.30)
Chloride: 105 mEq/L (ref 96–112)
HCT: 37 % (ref 36.0–46.0)
Hemoglobin: 12.6 g/dL (ref 12.0–15.0)
TCO2: 24 mmol/L (ref 0–100)

## 2012-10-11 MED ORDER — GADOBENATE DIMEGLUMINE 529 MG/ML IV SOLN
20.0000 mL | Freq: Once | INTRAVENOUS | Status: AC | PRN
  Administered 2012-10-11: 20 mL via INTRAVENOUS

## 2012-10-22 ENCOUNTER — Encounter: Payer: Self-pay | Admitting: Internal Medicine

## 2012-10-22 ENCOUNTER — Ambulatory Visit (INDEPENDENT_AMBULATORY_CARE_PROVIDER_SITE_OTHER): Payer: BC Managed Care – PPO | Admitting: Internal Medicine

## 2012-10-22 ENCOUNTER — Other Ambulatory Visit (INDEPENDENT_AMBULATORY_CARE_PROVIDER_SITE_OTHER): Payer: BC Managed Care – PPO

## 2012-10-22 VITALS — BP 128/80 | HR 80 | Ht 66.0 in | Wt 216.8 lb

## 2012-10-22 DIAGNOSIS — R197 Diarrhea, unspecified: Secondary | ICD-10-CM

## 2012-10-22 DIAGNOSIS — R1032 Left lower quadrant pain: Secondary | ICD-10-CM

## 2012-10-22 LAB — TSH: TSH: 0.71 u[IU]/mL (ref 0.35–5.50)

## 2012-10-22 LAB — VITAMIN B12: Vitamin B-12: >1500 pg/mL — ABNORMAL HIGH (ref 211–911)

## 2012-10-22 LAB — IBC PANEL
Iron: 39 ug/dL — ABNORMAL LOW (ref 42–145)
Saturation Ratios: 8.4 % — ABNORMAL LOW (ref 20.0–50.0)
Transferrin: 331.1 mg/dL (ref 212.0–360.0)

## 2012-10-22 MED ORDER — MOVIPREP 100 G PO SOLR: 1.0000 | Freq: Once | ORAL | Status: DC

## 2012-10-22 MED ORDER — BENEFIBER PO POWD: ORAL | Status: DC

## 2012-10-22 MED ORDER — COLESEVELAM HCL 625 MG PO TABS: 1250.0000 mg | ORAL_TABLET | Freq: Every day | ORAL | Status: DC

## 2012-10-22 NOTE — Progress Notes (Signed)
Debra Harris September 26, 1950 MRN [id number]  History of Present Illness:  This is a 62 year old white female wife of Dr. Hilda Harris, orthopedist in [place], [place]. She is here today because of urgent bowel movements, not diarrhea, but rather frequent formed stools which occur in the mornings as well as during the day but not at night. There has been no blood associated with these bowel movements. She also has chronic intermittent left lower quadrant abdominal pain which occurs at least 2 or 3 times a week. It has been getting more frequent. She is unable to take antispasmotics because of a diagnosis of glaucoma. She had a laparoscopic cholecystectomy more than 20 years ago for cholelithiasis. She had a colonoscopy in November 2010  which was an essentially normal exam. She has external hemorrhoids. A prior colonoscopy in 2005 showed an adenomatous polyp. Her weight has been stable or slightly gradually increased. Her eating habits have been healthy and she is on a high fiber diet. There is no family history of inflammatory bowel disease or colon cancer. She takes metformin 1000 mg daily. She thinks her diarrhea preceeded her taking metformin.   Past Medical History  Diagnosis Date  . Diabetes mellitus   . Hyperlipemia   . Allergic rhinitis   . Neuropathy, leg   . Ovarian cyst 11/20/00  . External hemorrhoids   . Adenomatous colon polyp   . GERD (gastroesophageal reflux disease)   . Sliding hiatal hernia   . Arthritis   . Glaucoma   . Diverticulosis    Past Surgical History  Procedure Laterality Date  . Partial hysterectomy  age 37s  . Oophorectomy  age 1s  . Cholecystectomy    . Carpal tunnel release Bilateral   . Thumb surger Bilateral   . Knee surgery Bilateral     reports that she has quit smoking. She has never used smokeless tobacco. She reports that  drinks alcohol. She reports that she does not use illicit drugs. family history includes Ulcers in her mother. There is no history of  Colon cancer. No Known Allergies      Review of Systems: Denies dysphagia chest pain. Occasional reflux  The remainder of the 10 point ROS is negative except as outlined in H&P   Physical Exam: General appearance  Well developed, in no distress. Overweight Eyes- non icteric. HEENT nontraumatic, normocephalic. Mouth no lesions, tongue papillated, no cheilosis. Neck supple without adenopathy, thyroid not enlarged, no carotid bruits, no JVD. Lungs Clear to auscultation bilaterally. Cor normal S1, normal S2, regular rhythm, no murmur,  quiet precordium. Abdomen: Obese soft with tenderness in left lower quadrant. No palpable mass or fullness. Normal active bowel sounds. No distention or tympany. Rectal: Normal rectal sphincter tone. Small amount of Hemoccult negative stool. Extremities no pedal edema. Skin no lesions. Neurological alert and oriented x 3. Psychological normal mood and affect.  Assessment and Plan:  Problem #85 62 year old, white female with irritable bowel syndrome and predominant left lower quadrant abdominal discomfort as well as colon spasm. She has a history of glaucoma and therefore the use of antispasmodics is contraindicated. A prior cholecystectomy may be contributing to the diarrhea on the basis of bile overflow. Metformin may be also contributing to diarrhea. She takes 1000 mg daily. We will plan to repeat her colonoscopy to rule out microscopic colitis. We will obtain random biopsies. We will also check on her TSH level and sprue profile today as well as B12 and iron studies. We will start her on WelChol 625  mg, 2 tablets daily and Benefiber one teaspoon twice a day. Depending on the results of the above colonoscopy and random biopsies, we may also need to add an antimotility agent such as Imodium or Lomotil. She will hold her metformin at this time for 2-4 weeks to see if there is any difference.   10/22/2012 Debra Harris

## 2012-10-22 NOTE — Patient Instructions (Addendum)
You have been scheduled for a colonoscopy with propofol. Please follow written instructions given to you at your visit today.  Please pick up your prep kit at the pharmacy within the next 1-3 days. If you use inhalers (even only as needed), please bring them with you on the day of your procedure. Your physician has requested that you go to www.startemmi.com and enter the access code given to you at your visit today. This web site gives a general overview about your procedure. However, you should still follow specific instructions given to you by our office regarding your preparation for the procedure.  Please discontinue Metformin. This could be contributing to your diarrhea.  Please purchase the following medications over the counter and take as directed: Benefiber-2 heaping teaspoons daily in liquid or on food.  We have sent the following medications to your pharmacy for you to pick up at your convenience: [facility]  Your physician has requested that you go to the basement for the following lab work before leaving today: TSH, Celiac 10, B12, IBC  CC: Dr Carylon Perches

## 2012-10-30 ENCOUNTER — Encounter: Payer: BC Managed Care – PPO | Admitting: Internal Medicine

## 2012-11-01 ENCOUNTER — Ambulatory Visit (AMBULATORY_SURGERY_CENTER): Payer: BC Managed Care – PPO | Admitting: Internal Medicine

## 2012-11-01 ENCOUNTER — Encounter: Payer: Self-pay | Admitting: Internal Medicine

## 2012-11-01 VITALS — BP 149/82 | HR 64 | Temp 97.3°F | Resp 18 | Ht 66.0 in | Wt 212.0 lb

## 2012-11-01 DIAGNOSIS — R1032 Left lower quadrant pain: Secondary | ICD-10-CM

## 2012-11-01 DIAGNOSIS — R197 Diarrhea, unspecified: Secondary | ICD-10-CM

## 2012-11-01 DIAGNOSIS — D126 Benign neoplasm of colon, unspecified: Secondary | ICD-10-CM

## 2012-11-01 DIAGNOSIS — K921 Melena: Secondary | ICD-10-CM

## 2012-11-01 MED ORDER — DIPHENOXYLATE-ATROPINE 2.5-0.025 MG PO TABS: 1.0000 | ORAL_TABLET | Freq: Every day | ORAL | Status: DC | PRN

## 2012-11-01 MED ORDER — DIPHENOXYLATE-ATROPINE 2.5-0.025 MG PO TABS: 1.0000 | ORAL_TABLET | ORAL | Status: DC

## 2012-11-01 MED ORDER — SODIUM CHLORIDE 0.9 % IV SOLN: 500.0000 mL | INTRAVENOUS | Status: DC

## 2012-11-01 NOTE — Progress Notes (Signed)
A/ox3 pleased with MAC, report to Penny RN 

## 2012-11-01 NOTE — Progress Notes (Signed)
Called to room to assist during endoscopic procedure.  Patient ID and intended procedure confirmed with present staff. Received instructions for my participation in the procedure from the performing physician.  

## 2012-11-01 NOTE — Progress Notes (Signed)
Patient did not experience any of the following events: a burn prior to discharge; a fall within the facility; wrong site/side/patient/procedure/implant event; or a hospital transfer or hospital admission upon discharge from the facility. (G8907) Patient did not have preoperative order for IV antibiotic SSI prophylaxis. (G8918)  

## 2012-11-01 NOTE — Patient Instructions (Addendum)

## 2012-11-01 NOTE — Op Note (Signed)
[facility] [address] [place], [address]   COLONOSCOPY PROCEDURE REPORT  PATIENT: Debra Harris, Debra Harris  MR#: [id number] BIRTHDATE: 10-04-1950 , 62  yrs. old GENDER: Female ENDOSCOPIST: Hart Carwin, MD REFERRED BY:Roy Ouida Sills, M.D. PROCEDURE DATE:  11/01/2012 PROCEDURE:   Colonoscopy, diagnostic and Colonoscopy with biopsy First Screening Colonoscopy - Avg.  risk and is 50 yrs.  old or older - No.  Prior Negative Screening - Now for repeat screening. N/A  History of Adenoma - Now for follow-up colonoscopy & has been > or = to 3 yrs.  Yes hx of adenoma.  Has been 3 or more years since last colonoscopy.  Polyps Removed Today? No.  Recommend repeat exam, <10 yrs? No. ASA CLASS:   Class II INDICATIONS:Rectal Bleeding and prior colonoscopy in 2005 and 2010,, on Metformin, Hx of lap chole. MEDICATIONS: MAC sedation, administered by CRNA and Propofol (Diprivan) 120 mg IV  DESCRIPTION OF PROCEDURE:   After the risks benefits and alternatives of the procedure were thoroughly explained, informed consent was obtained.  A digital rectal exam revealed no abnormalities of the rectum.   The LB [id number]-[id number]  endoscope was introduced through the anus and advanced to the cecum, which was identified by both the appendix and ileocecal valve. No adverse events experienced.   The quality of the prep was good, using MoviPrep  The instrument was then slowly withdrawn as the colon was fully examined.      COLON FINDINGS: Mild diverticulosis was noted in the sigmoid colon. Random colon biopsies taken Retroflexed views revealed no abnormalities. The time to cecum=3 minutes 12 seconds.  Withdrawal time=6 minutes 08 seconds.  The scope was withdrawn and the procedure completed. Colon did not appear to be spastic or tortuous COMPLICATIONS: There were no complications.  ENDOSCOPIC IMPRESSION: Mild diverticulosis was noted in the sigmoid colon Random biopsies of normal appearing  colon mucosa to r/o micrscopic colitis Ethiology of diarrhea?? post cholecystectomy and IBS  RECOMMENDATIONS: Await biopsy results Welchol 625 mg , 2 po qd Lomotil 2x/week   eSigned:  Hart Carwin, MD 11/01/2012 12:38 PM   cc:

## 2012-11-04 ENCOUNTER — Telehealth: Payer: Self-pay | Admitting: *Deleted

## 2012-11-04 NOTE — Telephone Encounter (Signed)
No answer, left message to call is questions or concerns. 

## 2012-11-04 NOTE — Telephone Encounter (Signed)
Pt. Advised to monitor bleeding and call back if needed per advice of Dr. Russella Dar.  She verbalized understanding.

## 2012-11-04 NOTE — Telephone Encounter (Signed)
Pt. States that she had no bleeding on Saturday after colonoscopy on Friday.  She passed a "large tablespoon sized clot" on Sunday.  Noted some dark blood smears When cleaning herself after this.  States she feels"fine".

## 2012-11-04 NOTE — Telephone Encounter (Signed)
She can monitor and call back if she has more bleeding. Dr. Juanda Chance patient.

## 2012-11-07 ENCOUNTER — Encounter: Payer: Self-pay | Admitting: Internal Medicine

## 2012-12-24 ENCOUNTER — Encounter: Payer: Self-pay | Admitting: Internal Medicine

## 2012-12-24 ENCOUNTER — Ambulatory Visit (INDEPENDENT_AMBULATORY_CARE_PROVIDER_SITE_OTHER): Payer: BC Managed Care – PPO | Admitting: Internal Medicine

## 2012-12-24 VITALS — BP 110/70 | HR 88 | Ht 64.0 in | Wt 222.4 lb

## 2012-12-24 DIAGNOSIS — K589 Irritable bowel syndrome without diarrhea: Secondary | ICD-10-CM

## 2012-12-24 NOTE — Progress Notes (Addendum)
Debra Harris 02-09-1951 MRN [id number]  History of Present Illness:  This is a 62 year old white female with fecal urgency and occasional accidents. She had a full colonoscopy on 11/01/2012 with findings of serrated adenomatous polyps. There was no evidence of microscopic colitis. She was started on WelChol 625 mg, 2 a day and Lomotil but discontinued the medications because she became constipated. She also discontinued metformin which was contributing to the diarrhea. She has a history of glaucoma (followed by Dr Laure Kidney in [place], [place]) and is unable to take anti-cholinergic medications. Her sprue profile was negative.   Past Medical History  Diagnosis Date  . Diabetes mellitus   . Hyperlipemia   . Allergic rhinitis   . Neuropathy, leg   . Ovarian cyst 11/20/00  . External hemorrhoids   . Adenomatous colon polyp   . GERD (gastroesophageal reflux disease)   . Sliding hiatal hernia   . Arthritis   . Glaucoma   . Diverticulosis    Past Surgical History  Procedure Laterality Date  . Partial hysterectomy  age 50s  . Oophorectomy  age 17s  . Cholecystectomy    . Carpal tunnel release Bilateral   . Thumb surger Bilateral   . Knee surgery Bilateral     reports that she has quit smoking. She has never used smokeless tobacco. She reports that she drinks about 0.6 ounces of alcohol per week. She reports that she does not use illicit drugs. family history includes Ulcers in her mother. There is no history of Colon cancer. No Known Allergies      Review of Systems: Negative for rectal bleeding weight loss. Positive for left lower quadrant abdominal pain  The remainder of the 10 point ROS is negative except as outlined in H&P   Physical Exam: General appearance  Well developed, in no distress. Eyes- non icteric. HEENT nontraumatic, normocephalic. Mouth no lesions, tongue papillated, no cheilosis. Neck supple without adenopathy, thyroid not enlarged, no carotid bruits, no  JVD. Lungs Clear to auscultation bilaterally. Cor normal S1, normal S2, regular rhythm, no murmur,  quiet precordium. Abdomen: Tender left lower quadrant. Rectal: Not repeated. Extremities no pedal edema. Skin no lesions. Neurological alert and oriented x 3. Psychological normal mood and affect.  Assessment and Plan:  Problem #47 62 year old white female with irritable bowel syndrome, fecal urgency and incomplete evacuation. This could possibly be a rectocele. She has a history of cystocele repair in the past. She is unable to take anti-cholinergic medications because of glaucoma. I will ask her ophthalmologist, Dr Laure Kidney if there is any preparation such as dicyclomine which she could take safely. We will also send her for consultation to evaluate her fecal incontinence. We will use probiotics and fiber supplements. She may take glycerin suppositories when necessary as well.   12/24/2012 [name]

## 2012-12-24 NOTE — Patient Instructions (Addendum)
We will schedule an appointment with Debra Harris, PT at [facility] for fecal urgency and incontinence. We will contact you once we receive appointment information.  Please purchase glycerin suppositories over the counter. Use per rectum as needed for better evacuation.  We have given you samples of Florastor. This puts good bacteria back into your colon. You should take 1 capsule by mouth once daily. If this works well for you, it can be purchased over the counter.  CC: Dr Carylon Perches, Dr Tomasa Rand, Ophth, [place]

## 2013-01-03 ENCOUNTER — Telehealth: Payer: Self-pay | Admitting: *Deleted

## 2013-01-03 MED ORDER — DICYCLOMINE HCL 10 MG PO CAPS: ORAL_CAPSULE | ORAL | Status: DC

## 2013-01-03 NOTE — Telephone Encounter (Signed)
Message copied by Daphine Deutscher on Fri Jan 03, 2013  9:32 AM ------      Message from: Hart Carwin      Created: Thu Jan 02, 2013 10:16 PM      Regarding: dicyclomine       Rene Kocher, I have spoken to Dr Lita Mains, pt's ophthalmologist, who approves for me to use  An antispasmodic for her IBS. Please start Bentyl 10 mg, #90, 1 po tid ac, 1 refill. Also call pt about her medication being OK'ed by Dr Lita Mains who suggests that she sees him about a month after starting the medication ,to recheck the intraoccular pressure ( she has a wide angle glaucoma)Thanx ------

## 2013-01-03 NOTE — Telephone Encounter (Signed)
Spoke with patient and gave her Dr. Regino Schultze recommendations. Rx sent to pharmacy. She will scheduled OV with Dr. Lita Mains in one month after starting Bentyl.

## 2013-01-03 NOTE — Telephone Encounter (Signed)
Left a message at home number 

## 2013-01-09 ENCOUNTER — Telehealth: Payer: Self-pay | Admitting: *Deleted

## 2013-01-09 NOTE — Telephone Encounter (Signed)
Patient was scheduled for an appointment with Ruben Gottron, PT @ [facility] for fecal incontinence on 01/08/13. Per [facility], patient cancelled this appointment and did not reschedule.

## 2013-01-09 NOTE — Telephone Encounter (Signed)
OK 

## 2013-10-29 ENCOUNTER — Ambulatory Visit (INDEPENDENT_AMBULATORY_CARE_PROVIDER_SITE_OTHER): Payer: BC Managed Care – PPO | Admitting: Pulmonary Disease

## 2013-10-29 ENCOUNTER — Encounter: Payer: Self-pay | Admitting: Pulmonary Disease

## 2013-10-29 VITALS — BP 90/60 | HR 76 | Temp 98.2°F | Ht 65.0 in | Wt 215.2 lb

## 2013-10-29 DIAGNOSIS — Z23 Encounter for immunization: Secondary | ICD-10-CM

## 2013-10-29 DIAGNOSIS — G4733 Obstructive sleep apnea (adult) (pediatric): Secondary | ICD-10-CM

## 2013-10-29 NOTE — Progress Notes (Signed)
   Subjective:    Patient ID: Debra Harris, female    DOB: 03-17-1950, 63 y.o.   MRN: [id number]  HPI The patient comes in today for followup of her obstructive sleep apnea. Her download shows excellent compliance, no significant mask leak, and very good control of her obstructive sleep apnea. The patient is also lost weight since the last visit, and I have commended her on this. However, despite doing well with CPAP and good control of her sleep apnea, she continues to feel that she does not sleep well. She also does not feel rested in the mornings upon arising.    Review of Systems  Constitutional: Negative for fever and unexpected weight change.  HENT: Negative for congestion, dental problem, ear pain, nosebleeds, postnasal drip, rhinorrhea, sinus pressure, sneezing, sore throat and trouble swallowing.   Eyes: Negative for redness and itching.  Respiratory: Positive for shortness of breath and wheezing. Negative for cough and chest tightness.   Cardiovascular: Negative for palpitations and leg swelling.  Gastrointestinal: Negative for nausea and vomiting.  Genitourinary: Negative for dysuria.  Musculoskeletal: Negative for joint swelling.  Skin: Negative for rash.  Neurological: Negative for headaches.  Hematological: Does not bruise/bleed easily.  Psychiatric/Behavioral: Negative for dysphoric mood. The patient is not nervous/anxious.        Objective:   Physical Exam Overweight female in no acute distress Nose without purulence or discharge noted Neck without lymphadenopathy or thyromegaly No skin breakdown or pressure necrosis from the CPAP mass Lower extremities with 1+ edema bilaterally, no cyanosis Alert and oriented, moves all 4 extremities.       Assessment & Plan:

## 2013-10-29 NOTE — Assessment & Plan Note (Signed)
The patient is doing very well with CPAP, and her downloaded shows excellent control of her events. She is also lost weight since the last visit. I have asked her to continue on her CPAP device, keep up with mask changes and supplies, and to keep working on weight loss. With regard to her nonrestorative sleep, I have reviewed the various issues that can contribute to this, including environmental issues, anxiety/depression, chronic depression/anxiety, and finally other medical issues that can intervene. I have asked her to consider these possibilities.

## 2013-10-29 NOTE — Patient Instructions (Signed)
Continue with cpap.  Your download looks excellent. Think about environmental issues, chronic pain, depression/anxiety, medical issues as other causes of disruptive sleep. Keep working on weight loss, you are doing well.  followup with me again in one year.

## 2013-11-19 ENCOUNTER — Other Ambulatory Visit (HOSPITAL_COMMUNITY): Payer: Self-pay | Admitting: Internal Medicine

## 2013-11-19 DIAGNOSIS — R109 Unspecified abdominal pain: Secondary | ICD-10-CM

## 2013-12-03 ENCOUNTER — Ambulatory Visit (HOSPITAL_COMMUNITY)
Admission: RE | Admit: 2013-12-03 | Discharge: 2013-12-03 | Disposition: A | Discharge status: Home / Self Care | Payer: BC Managed Care – PPO | Source: Ambulatory Visit | Attending: Internal Medicine | Admitting: Internal Medicine

## 2013-12-03 ENCOUNTER — Encounter (HOSPITAL_COMMUNITY): Payer: Self-pay

## 2013-12-03 DIAGNOSIS — K5730 Diverticulosis of large intestine without perforation or abscess without bleeding: Secondary | ICD-10-CM | POA: Diagnosis not present

## 2013-12-03 DIAGNOSIS — R102 Pelvic and perineal pain: Secondary | ICD-10-CM | POA: Insufficient documentation

## 2013-12-03 DIAGNOSIS — R109 Unspecified abdominal pain: Secondary | ICD-10-CM | POA: Insufficient documentation

## 2013-12-03 DIAGNOSIS — K760 Fatty (change of) liver, not elsewhere classified: Secondary | ICD-10-CM | POA: Insufficient documentation

## 2013-12-03 LAB — POCT I-STAT CREATININE: Creatinine, Ser: 0.6 mg/dL (ref 0.50–1.10)

## 2013-12-03 MED ORDER — IOHEXOL 300 MG/ML  SOLN
100.0000 mL | Freq: Once | INTRAMUSCULAR | Status: AC | PRN
  Administered 2013-12-03: 100 mL via INTRAVENOUS

## 2014-01-20 DIAGNOSIS — Z8669 Personal history of other diseases of the nervous system and sense organs: Secondary | ICD-10-CM | POA: Insufficient documentation

## 2014-01-20 DIAGNOSIS — Z961 Presence of intraocular lens: Secondary | ICD-10-CM | POA: Insufficient documentation

## 2014-01-20 DIAGNOSIS — H0230 Blepharochalasis unspecified eye, unspecified eyelid: Secondary | ICD-10-CM | POA: Insufficient documentation

## 2014-01-20 DIAGNOSIS — H04123 Dry eye syndrome of bilateral lacrimal glands: Secondary | ICD-10-CM | POA: Insufficient documentation

## 2014-03-30 ENCOUNTER — Encounter: Payer: Self-pay | Admitting: Pulmonary Disease

## 2014-03-30 ENCOUNTER — Telehealth: Payer: Self-pay | Admitting: Pulmonary Disease

## 2014-03-30 NOTE — Telephone Encounter (Signed)
Called spoke with pt. She reports she never has smoked but tried once in the 7th grade and that was it. I have changed this in chart. Nothing further needed

## 2014-03-31 DIAGNOSIS — J302 Other seasonal allergic rhinitis: Secondary | ICD-10-CM | POA: Insufficient documentation

## 2014-03-31 DIAGNOSIS — E119 Type 2 diabetes mellitus without complications: Secondary | ICD-10-CM | POA: Insufficient documentation

## 2014-06-03 ENCOUNTER — Ambulatory Visit (HOSPITAL_COMMUNITY): Payer: Self-pay | Admitting: Hematology & Oncology

## 2014-06-03 ENCOUNTER — Encounter (HOSPITAL_COMMUNITY): Payer: Self-pay | Admitting: Hematology & Oncology

## 2014-06-03 ENCOUNTER — Encounter (HOSPITAL_COMMUNITY): Payer: BLUE CROSS/BLUE SHIELD | Attending: Hematology & Oncology | Admitting: Hematology & Oncology

## 2014-06-03 VITALS — BP 149/64 | HR 71 | Temp 97.9°F | Resp 18 | Ht 63.0 in | Wt 217.5 lb

## 2014-06-03 DIAGNOSIS — K922 Gastrointestinal hemorrhage, unspecified: Secondary | ICD-10-CM

## 2014-06-03 DIAGNOSIS — K648 Other hemorrhoids: Secondary | ICD-10-CM

## 2014-06-03 DIAGNOSIS — K909 Intestinal malabsorption, unspecified: Secondary | ICD-10-CM

## 2014-06-03 DIAGNOSIS — D509 Iron deficiency anemia, unspecified: Secondary | ICD-10-CM | POA: Diagnosis not present

## 2014-06-03 DIAGNOSIS — R5383 Other fatigue: Secondary | ICD-10-CM

## 2014-06-03 DIAGNOSIS — K921 Melena: Secondary | ICD-10-CM | POA: Diagnosis not present

## 2014-06-03 LAB — COMPREHENSIVE METABOLIC PANEL
ALK PHOS: 71 U/L (ref 39–117)
ALT: 23 U/L (ref 0–35)
ANION GAP: 9 (ref 5–15)
AST: 25 U/L (ref 0–37)
Albumin: 4.4 g/dL (ref 3.5–5.2)
BILIRUBIN TOTAL: 0.6 mg/dL (ref 0.3–1.2)
BUN: 10 mg/dL (ref 6–23)
CHLORIDE: 105 mmol/L (ref 96–112)
CO2: 26 mmol/L (ref 19–32)
Calcium: 9.1 mg/dL (ref 8.4–10.5)
Creatinine, Ser: 0.55 mg/dL (ref 0.50–1.10)
GLUCOSE: 94 mg/dL (ref 70–99)
POTASSIUM: 3.7 mmol/L (ref 3.5–5.1)
SODIUM: 140 mmol/L (ref 135–145)
Total Protein: 7.9 g/dL (ref 6.0–8.3)

## 2014-06-03 LAB — CBC WITH DIFFERENTIAL/PLATELET
BASOS PCT: 1 % (ref 0–1)
Basophils Absolute: 0 10*3/uL (ref 0.0–0.1)
EOS PCT: 4 % (ref 0–5)
Eosinophils Absolute: 0.2 10*3/uL (ref 0.0–0.7)
HCT: 35.5 % — ABNORMAL LOW (ref 36.0–46.0)
Hemoglobin: 10.8 g/dL — ABNORMAL LOW (ref 12.0–15.0)
LYMPHS PCT: 34 % (ref 12–46)
Lymphs Abs: 1.9 10*3/uL (ref 0.7–4.0)
MCH: 25.2 pg — ABNORMAL LOW (ref 26.0–34.0)
MCHC: 30.4 g/dL (ref 30.0–36.0)
MCV: 82.9 fL (ref 78.0–100.0)
MONO ABS: 0.4 10*3/uL (ref 0.1–1.0)
Monocytes Relative: 8 % (ref 3–12)
NEUTROS ABS: 3 10*3/uL (ref 1.7–7.7)
Neutrophils Relative %: 53 % (ref 43–77)
PLATELETS: 287 10*3/uL (ref 150–400)
RBC: 4.28 MIL/uL (ref 3.87–5.11)
RDW: 17.4 % — ABNORMAL HIGH (ref 11.5–15.5)
WBC: 5.6 10*3/uL (ref 4.0–10.5)

## 2014-06-03 NOTE — Progress Notes (Signed)
Debra Harris's reason for visit today is for labs as scheduled per MD orders.  Venipuncture performed with a 23 gauge butterfly needle to R Antecubital.  Debra Harris tolerated procedure well and without incident; questions were answered and patient was discharged.

## 2014-06-03 NOTE — Patient Instructions (Signed)
[facility]  Discharge Instructions:  You saw Dr Whitney Muse today. Blood work today. IV iron next week. Return to see the doctor in 4 weeks. Please call the clinic if you have any questions or concerns.   _______________________________________________________________  Thank you for choosing [facility] to provide your oncology and hematology care.  To afford each patient quality time with our providers, please arrive at least 15 minutes before your scheduled appointment.  You need to re-schedule your appointment if you arrive 10 or more minutes late.  We strive to give you quality time with our providers, and arriving late affects you and other patients whose appointments are after yours.  Also, if you no show three or more times for appointments you may be dismissed from the clinic.  Again, thank you for choosing [facility] hope is that these requests will allow you access to exceptional care and in a timely manner. _______________________________________________________________  If you have questions after your visit, please contact our office at [phone] between the hours of 8:30 a.m. and 5:00 p.m. Voicemails left after 4:30 p.m. will not be returned until the following business day. _______________________________________________________________  For prescription refill requests, have your pharmacy contact our office. _______________________________________________________________  Recommendations made by the consultant and any test results will be sent to your referring physician. _______________________________________________________________

## 2014-06-03 NOTE — Progress Notes (Signed)
[place] NOTE  Patient Care Team: Asencion Noble, MD as PCP - General (Internal Medicine)  CHIEF COMPLAINTS/PURPOSE OF CONSULTATION:  Iron deficiency anemia Last Colonoscopy 11/01/2012 with diverticulosis CBC on 04/28/2013 shows a hemoglobin of 11.3, hematocrit of 35.9, and MCV of 75.7. RDW is elevated at 19.3 CBC dated 03/19/2013 shows a hemoglobin of 9.7, hematocrit of 30.2, MCV of 73.3. Ferritin level of 13 ng/ml on 04/2014  HISTORY OF PRESENTING ILLNESS:  Debra Harris 64 y.o. female is here because of recurrent iron deficiency. She notes she has had multiple colonoscopies, approximately 3 in the past 9 years. She has been diagnosed with internal hemorrhoids and has rare blood in the toilet. She has not done any stool cards recently. She had seen Dr. Romona Curls at his hemorrhoid clinic in [place] many years ago.  She has tried iron tablets but thinks she must not absorb iron well. She took them for over a year twice daily but her iron levels never seemed to completely improve. She continues to remain mildly anemic. Her last colonoscopy was in [place]. She has seen GI physicians here in [place] as well. He denies any pagophagia.    MEDICAL HISTORY:  Past Medical History  Diagnosis Date  . Diabetes mellitus   . Hyperlipemia   . Allergic rhinitis   . Neuropathy, leg   . Ovarian cyst 11/20/00  . External hemorrhoids   . Adenomatous colon polyp   . GERD (gastroesophageal reflux disease)   . Sliding hiatal hernia   . Arthritis   . Glaucoma   . Diverticulosis     SURGICAL HISTORY: Past Surgical History  Procedure Laterality Date  . Partial hysterectomy  age 79s  . Oophorectomy  age 21s  . Cholecystectomy    . Carpal tunnel release Bilateral   . Thumb surger Bilateral   . Knee surgery Bilateral     SOCIAL HISTORY: History   Social History  . Marital Status: Married    Spouse Name: J. Orland Dec  . Number of Children: Y  . Years of  Education: N/A   Occupational History  . AUDIOLOGIST    Social History Main Topics  . Smoking status: Never Smoker   . Smokeless tobacco: Never Used     Comment: Pt reports tried once in 7th grade//ms  . Alcohol Use: 0.6 oz/week    1 Glasses of wine per week     Comment: rare  . Drug Use: No  . Sexual Activity: Not on file   Other Topics Concern  . Not on file   Social History Narrative   3 children and one grandchild. She has been married for over 40 years. She is a never smoker. She does not drink alcohol. She still is very active and works outside the home.  FAMILY HISTORY: Family History  Problem Relation Age of Onset  . Ulcers Mother   . Colon cancer Neg Hx    has no family status information on file.  Father died when the patient was 22 from suicide. Her mother died at 65 from an MI. She had glaucoma that caused blindness. She has no siblings.   ALLERGIES:  has No Known Allergies.  MEDICATIONS:  Current Outpatient Prescriptions  Medication Sig Dispense Refill  . amitriptyline (ELAVIL) 25 MG tablet Take 50 mg by mouth.    Marland Kitchen atorvastatin (LIPITOR) 40 MG tablet Take by mouth.    . cetirizine (ZYRTEC) 10 MG tablet Take 10 mg by mouth.    Marland Kitchen  docusate sodium (COLACE) 100 MG capsule Take 100 mg by mouth.    . ferrous sulfate 325 (65 FE) MG tablet Take 325 mg by mouth QID. 2 times in am and 2 times in evening    . guaifenesin (HUMIBID E) 400 MG TABS tablet Take 400 mg by mouth.    . metFORMIN (GLUCOPHAGE) 500 MG tablet Take 1,000 mg by mouth daily.    . naproxen sodium (ANAPROX) 220 MG tablet Take 220 mg by mouth.    Marland Kitchen UNKNOWN TO PATIENT CPAP machine    . UNKNOWN TO PATIENT Thera tears gel- apply to eyes at bedtime    . traMADol (ULTRAM) 50 MG tablet Take 50 mg by mouth.    . Wheat Dextrin (BENEFIBER) POWD 2 heaping teaspoons daily (Patient not taking: Reported on 06/03/2014) 1 g 0   No current facility-administered medications for this visit.    Review of Systems    Constitutional: Positive for malaise/fatigue.  HENT: Negative.   Eyes: Negative.   Respiratory: Negative.   Cardiovascular: Negative.   Gastrointestinal: Negative.   Genitourinary: Negative.   Musculoskeletal: Positive for myalgias.       Stabbing pain in feet and legs R knee and hip pain  Skin: Negative.   Neurological: Positive for dizziness. Negative for tingling, tremors, sensory change, speech change, focal weakness, seizures and loss of consciousness.  Endo/Heme/Allergies: Negative.   Psychiatric/Behavioral: The patient has insomnia.     PHYSICAL EXAMINATION: ECOG PERFORMANCE STATUS: 0 - Asymptomatic  Filed Vitals:   06/03/14 1400  BP: 149/64  Pulse: 71  Temp: 97.9 F (36.6 C)  Resp: 18   Filed Weights   06/03/14 1400  Weight: 217 lb 8 oz (98.657 kg)     Physical Exam  Constitutional: She is oriented to person, place, and time and well-developed, well-nourished, and in no distress.  HENT:  Head: Normocephalic and atraumatic.  Nose: Nose normal.  Mouth/Throat: Oropharynx is clear and moist. No oropharyngeal exudate.  Eyes: Conjunctivae and EOM are normal. Pupils are equal, round, and reactive to light. Right eye exhibits no discharge. Left eye exhibits no discharge. No scleral icterus.  Neck: Normal range of motion. Neck supple. No tracheal deviation present. No thyromegaly present.  Cardiovascular: Normal rate, regular rhythm and normal heart sounds.  Exam reveals no gallop and no friction rub.   No murmur heard. Pulmonary/Chest: Effort normal and breath sounds normal. She has no wheezes. She has no rales.  Abdominal: Soft. Bowel sounds are normal. She exhibits no distension and no mass. There is no tenderness. There is no rebound and no guarding.  Musculoskeletal: Normal range of motion. She exhibits no edema.  Lymphadenopathy:    She has no cervical adenopathy.  Neurological: She is alert and oriented to person, place, and time. She has normal reflexes. No  cranial nerve deficit. Gait normal. Coordination normal.  Skin: Skin is warm and dry. No rash noted.  Psychiatric: Mood, memory, affect and judgment normal.  Nursing note and vitals reviewed.    LABORATORY DATA:  I have reviewed the data as listed Lab Results  Component Value Date   WBC 5.6 06/03/2014   HGB 10.8* 06/03/2014   HCT 35.5* 06/03/2014   MCV 82.9 06/03/2014   PLT 287 06/03/2014     Chemistry      Component Value Date/Time   NA 140 06/03/2014 1530   K 3.7 06/03/2014 1530   CL 105 06/03/2014 1530   CO2 26 06/03/2014 1530   BUN 10 06/03/2014  1530   CREATININE 0.55 06/03/2014 1530      Component Value Date/Time   CALCIUM 9.1 06/03/2014 1530   ALKPHOS 71 06/03/2014 1530   AST 25 06/03/2014 1530   ALT 23 06/03/2014 1530   BILITOT 0.6 06/03/2014 1530      ASSESSMENT & PLAN:  Iron deficiency anemia Iron malabsorption Chronic GI related blood loss questionable secondary to internal hemorrhoids Fatigue secondary to iron deficiency  I discussed IV iron replacement with the patient in detail. The most likely cause of her anemia is due to chronic blood loss/malabsorption syndrome. We discussed some of the risks, benefits, and alternatives of intravenous iron infusions. The patient is symptomatic from anemia and the iron level is critically low. She tolerated oral iron supplement poorly and desires to achieved higher levels of iron faster for adequate hematopoesis. Some of the side-effects to be expected including risks of infusion reactions, phlebitis, headaches, nausea and fatigue.  The patient is willing to proceed. Patient education material was dispensed.   Goal is to keep ferritin level greater than or equal to 100 ng/ml  I will see her back in one month to assess her tolerance to IV iron. We will calculated her total iron deficit and replace her accordingly. I advised him moving forward we will then continue to follow her iron levels and blood counts and get her on  an iron replacement schedule is reasonable as possible.   No problem-specific assessment & plan notes found for this encounter.  Orders Placed This Encounter  Procedures  . CBC with Differential    Standing Status: Future     Number of Occurrences: 1     Standing Expiration Date: 06/03/2015  . Comprehensive metabolic panel    Standing Status: Future     Number of Occurrences: 1     Standing Expiration Date: 06/03/2015  . Ferritin    Standing Status: Future     Number of Occurrences: 1     Standing Expiration Date: 06/03/2015  . Iron and TIBC    Standing Status: Future     Number of Occurrences: 1     Standing Expiration Date: 06/03/2015  . Hemoglobinopathy evaluation    Standing Status: Future     Number of Occurrences: 1     Standing Expiration Date: 06/03/2015    All questions were answered. The patient knows to call the clinic with any problems, questions or concerns.   This note was electronically signed.    Molli Hazard, MD  06/29/2014 9:02 AM

## 2014-06-04 LAB — IRON AND TIBC
Iron: 55 ug/dL (ref 42–145)
Saturation Ratios: 11 % — ABNORMAL LOW (ref 20–55)
TIBC: 496 ug/dL — ABNORMAL HIGH (ref 250–470)
UIBC: 441 ug/dL — ABNORMAL HIGH (ref 125–400)

## 2014-06-04 LAB — FERRITIN: Ferritin: 12 ng/mL (ref 10–291)

## 2014-06-05 ENCOUNTER — Other Ambulatory Visit (HOSPITAL_COMMUNITY): Payer: Self-pay | Admitting: Hematology & Oncology

## 2014-06-05 ENCOUNTER — Ambulatory Visit (HOSPITAL_COMMUNITY): Payer: Self-pay | Admitting: Hematology & Oncology

## 2014-06-05 DIAGNOSIS — D509 Iron deficiency anemia, unspecified: Secondary | ICD-10-CM

## 2014-06-05 HISTORY — DX: Iron deficiency anemia, unspecified: D50.9

## 2014-06-08 LAB — HEMOGLOBINOPATHY EVALUATION
HGB A2 QUANT: 2 % (ref 0.7–3.1)
Hgb A: 98 % (ref 94.0–98.0)
Hgb C: 0 %
Hgb F Quant: 0 % (ref 0.0–2.0)
Hgb S Quant: 0 %

## 2014-06-09 ENCOUNTER — Encounter (HOSPITAL_BASED_OUTPATIENT_CLINIC_OR_DEPARTMENT_OTHER): Payer: BLUE CROSS/BLUE SHIELD

## 2014-06-09 DIAGNOSIS — D509 Iron deficiency anemia, unspecified: Secondary | ICD-10-CM

## 2014-06-09 DIAGNOSIS — K921 Melena: Secondary | ICD-10-CM

## 2014-06-09 MED ORDER — SODIUM CHLORIDE 0.9 % IV SOLN
510.0000 mg | Freq: Once | INTRAVENOUS | Status: AC
  Administered 2014-06-09: 510 mg via INTRAVENOUS
  Filled 2014-06-09: qty 17

## 2014-06-09 MED ORDER — SODIUM CHLORIDE 0.9 % IV SOLN
Freq: Once | INTRAVENOUS | Status: AC
Start: 2014-06-09 — End: 2014-06-09
  Administered 2014-06-09: 14:00:00 via INTRAVENOUS

## 2014-06-09 NOTE — Patient Instructions (Signed)
[place] at [facility] Discharge Instructions  RECOMMENDATIONS MADE BY THE CONSULTANT AND ANY TEST RESULTS WILL BE SENT TO YOUR REFERRING PHYSICIAN.  Iron infusion (feraheme) today as ordered. Return as scheduled.  Thank you for choosing [place] at [facility] to provide your oncology and hematology care.  To afford each patient quality time with our provider, please arrive at least 15 minutes before your scheduled appointment time.    You need to re-schedule your appointment should you arrive 10 or more minutes late.  We strive to give you quality time with our providers, and arriving late affects you and other patients whose appointments are after yours.  Also, if you no show three or more times for appointments you may be dismissed from the clinic at the providers discretion.     Again, thank you for choosing [facility].  Our hope is that these requests will decrease the amount of time that you wait before being seen by our physicians.       _____________________________________________________________  Should you have questions after your visit to [facility], please contact our office at [phone] between the hours of 8:30 a.m. and 4:30 p.m.  Voicemails left after 4:30 p.m. will not be returned until the following business day.  For prescription refill requests, have your pharmacy contact our office.

## 2014-06-09 NOTE — Progress Notes (Signed)
Tolerated iron infusion well. 

## 2014-06-29 ENCOUNTER — Encounter (HOSPITAL_COMMUNITY): Payer: Self-pay | Admitting: Hematology & Oncology

## 2014-07-08 ENCOUNTER — Encounter (HOSPITAL_COMMUNITY): Payer: Self-pay | Admitting: Hematology & Oncology

## 2014-07-08 ENCOUNTER — Encounter (HOSPITAL_COMMUNITY): Payer: BLUE CROSS/BLUE SHIELD | Attending: Hematology & Oncology | Admitting: Hematology & Oncology

## 2014-07-08 VITALS — BP 133/62 | HR 76 | Temp 98.5°F | Resp 16 | Wt 217.7 lb

## 2014-07-08 DIAGNOSIS — K921 Melena: Secondary | ICD-10-CM | POA: Diagnosis present

## 2014-07-08 DIAGNOSIS — K909 Intestinal malabsorption, unspecified: Secondary | ICD-10-CM | POA: Diagnosis not present

## 2014-07-08 DIAGNOSIS — R5383 Other fatigue: Secondary | ICD-10-CM | POA: Diagnosis not present

## 2014-07-08 DIAGNOSIS — D509 Iron deficiency anemia, unspecified: Secondary | ICD-10-CM | POA: Diagnosis not present

## 2014-07-08 DIAGNOSIS — K9089 Other intestinal malabsorption: Secondary | ICD-10-CM | POA: Diagnosis not present

## 2014-07-08 DIAGNOSIS — K922 Gastrointestinal hemorrhage, unspecified: Secondary | ICD-10-CM | POA: Diagnosis not present

## 2014-07-08 LAB — CBC WITH DIFFERENTIAL/PLATELET
BASOS ABS: 0 10*3/uL (ref 0.0–0.1)
Basophils Relative: 0 % (ref 0–1)
Eosinophils Absolute: 0.2 10*3/uL (ref 0.0–0.7)
Eosinophils Relative: 2 % (ref 0–5)
HEMATOCRIT: 37.5 % (ref 36.0–46.0)
Hemoglobin: 11.8 g/dL — ABNORMAL LOW (ref 12.0–15.0)
Lymphocytes Relative: 27 % (ref 12–46)
Lymphs Abs: 1.9 10*3/uL (ref 0.7–4.0)
MCH: 26.2 pg (ref 26.0–34.0)
MCHC: 31.5 g/dL (ref 30.0–36.0)
MCV: 83.3 fL (ref 78.0–100.0)
MONOS PCT: 6 % (ref 3–12)
Monocytes Absolute: 0.4 10*3/uL (ref 0.1–1.0)
NEUTROS ABS: 4.4 10*3/uL (ref 1.7–7.7)
NEUTROS PCT: 65 % (ref 43–77)
Platelets: 210 10*3/uL (ref 150–400)
RBC: 4.5 MIL/uL (ref 3.87–5.11)
RDW: 16.9 % — AB (ref 11.5–15.5)
WBC: 6.9 10*3/uL (ref 4.0–10.5)

## 2014-07-08 LAB — IRON AND TIBC
IRON: 78 ug/dL (ref 28–170)
Saturation Ratios: 17 % (ref 10.4–31.8)
TIBC: 447 ug/dL (ref 250–450)
UIBC: 369 ug/dL

## 2014-07-08 LAB — FERRITIN: Ferritin: 57 ng/mL (ref 11–307)

## 2014-07-08 NOTE — Patient Instructions (Signed)
[place] at [facility] Discharge Instructions  RECOMMENDATIONS MADE BY THE CONSULTANT AND ANY TEST RESULTS WILL BE SENT TO YOUR REFERRING PHYSICIAN.  Exam and discussion by Dr. Whitney Muse. [facility] [phone], you can call to schedule.  Referral is not needed Will check some labs today and if any thing else needs to be done we will contact you.  Follow-up in 2 months.   Thank you for choosing [place] at [facility] to provide your oncology and hematology care.  To afford each patient quality time with our provider, please arrive at least 15 minutes before your scheduled appointment time.    You need to re-schedule your appointment should you arrive 10 or more minutes late.  We strive to give you quality time with our providers, and arriving late affects you and other patients whose appointments are after yours.  Also, if you no show three or more times for appointments you may be dismissed from the clinic at the providers discretion.     Again, thank you for choosing [facility].  Our hope is that these requests will decrease the amount of time that you wait before being seen by our physicians.       _____________________________________________________________  Should you have questions after your visit to [facility], please contact our office at [phone] between the hours of 8:30 a.m. and 4:30 p.m.  Voicemails left after 4:30 p.m. will not be returned until the following business day.  For prescription refill requests, have your pharmacy contact our office.

## 2014-07-08 NOTE — Progress Notes (Signed)
[place] PROGRESS NOTE  Patient Care Team: Asencion Noble, MD as PCP - General (Internal Medicine)  CHIEF COMPLAINTS/PURPOSE OF CONSULTATION:  Iron deficiency anemia Last Colonoscopy 11/01/2012 with diverticulosis CBC on 04/28/2013 shows a hemoglobin of 11.3, hematocrit of 35.9, and MCV of 75.7. RDW is elevated at 19.3 CBC dated 03/19/2013 shows a hemoglobin of 9.7, hematocrit of 30.2, MCV of 73.3. Ferritin level of 13 ng/ml on 04/2014  HISTORY OF PRESENTING ILLNESS:  Debra Harris 64 y.o. female is here because of recurrent iron deficiency. She notes she has had multiple colonoscopies, approximately 3 in the past 9 years. She has been diagnosed with internal hemorrhoids and has rare blood in the toilet. She has not done any stool cards recently. She had seen Dr. Romona Curls at his hemorrhoid clinic in [place] many years ago.  Her fatigue has improved. She continues to report bleeding from her hemorrhoids. She reports sharp chest pain when she lays flat at night. She denies SOB or CP with exertion. The pain radiates to her left side. Continues to have BRB with bowel movements. She is enquiring who she can see in regards to her hemorrhoids.   MEDICAL HISTORY:  Past Medical History  Diagnosis Date  . Diabetes mellitus   . Hyperlipemia   . Allergic rhinitis   . Neuropathy, leg   . Ovarian cyst 11/20/00  . External hemorrhoids   . Adenomatous colon polyp   . GERD (gastroesophageal reflux disease)   . Sliding hiatal hernia   . Arthritis   . Glaucoma   . Diverticulosis     SURGICAL HISTORY: Past Surgical History  Procedure Laterality Date  . Partial hysterectomy  age 28s  . Oophorectomy  age 72s  . Cholecystectomy    . Carpal tunnel release Bilateral   . Thumb surger Bilateral   . Knee surgery Bilateral     SOCIAL HISTORY: History   Social History  . Marital Status: Married    Spouse Name: J. Orland Dec  . Number of Children: Y  . Years of Education: N/A     Occupational History  . AUDIOLOGIST    Social History Main Topics  . Smoking status: Never Smoker   . Smokeless tobacco: Never Used     Comment: Pt reports tried once in 7th grade//ms  . Alcohol Use: 0.6 oz/week    1 Glasses of wine per week     Comment: rare  . Drug Use: No  . Sexual Activity: Not on file   Other Topics Concern  . Not on file   Social History Narrative   3 children and one grandchild. She has been married for over 40 years. She is a never smoker. She does not drink alcohol. She still is very active and works outside the home.  FAMILY HISTORY: Family History  Problem Relation Age of Onset  . Ulcers Mother   . Colon cancer Neg Hx    has no family status information on file.  Father died when the patient was 71 from suicide. Her mother died at 68 from an MI. She had glaucoma that caused blindness. She has no siblings.   ALLERGIES:  has No Known Allergies.  MEDICATIONS:  Current Outpatient Prescriptions  Medication Sig Dispense Refill  . amitriptyline (ELAVIL) 25 MG tablet Take 50 mg by mouth.    Marland Kitchen atorvastatin (LIPITOR) 40 MG tablet Take by mouth.    . cetirizine (ZYRTEC) 10 MG tablet Take 10 mg by mouth.    Marland Kitchen  docusate sodium (COLACE) 100 MG capsule Take 100 mg by mouth.    . ferrous sulfate 325 (65 FE) MG tablet Take 325 mg by mouth QID. 2 times in am and 2 times in evening    . guaifenesin (HUMIBID E) 400 MG TABS tablet Take 400 mg by mouth.    . metFORMIN (GLUCOPHAGE) 500 MG tablet Take 1,000 mg by mouth daily.    . naproxen sodium (ANAPROX) 220 MG tablet Take 220 mg by mouth.    . traMADol (ULTRAM) 50 MG tablet Take 50 mg by mouth.    Marland Kitchen UNKNOWN TO PATIENT CPAP machine    . UNKNOWN TO PATIENT Thera tears gel- apply to eyes at bedtime    . Wheat Dextrin (BENEFIBER) POWD 2 heaping teaspoons daily (Patient not taking: Reported on 06/03/2014) 1 g 0   No current facility-administered medications for this visit.    Review of Systems  Constitutional:  Positive for malaise/fatigue.  HENT: Negative.   Eyes: Negative.   Respiratory: Negative.   Cardiovascular: Negative.   Gastrointestinal: Negative.   Genitourinary: Negative.   Musculoskeletal: Negative Skin: Negative.   Neurological: Negative for tingling, tremors, sensory change, speech change, focal weakness, seizures and loss of consciousness.  Endo/Heme/Allergies: Negative.     PHYSICAL EXAMINATION: ECOG PERFORMANCE STATUS: 0 - Asymptomatic  Filed Vitals:   07/08/14 1324  BP: 133/62  Pulse: 76  Temp: 98.5 F (36.9 C)  Resp: 16   Filed Weights   07/08/14 1324  Weight: 217 lb 11.2 oz (98.748 kg)    Physical Exam  Constitutional: She is oriented to person, place, and time and well-developed, well-nourished, and in no distress.  HENT:  Head: Normocephalic and atraumatic.  Nose: Nose normal.  Mouth/Throat: Oropharynx is clear and moist. No oropharyngeal exudate.  Eyes: Conjunctivae and EOM are normal. Pupils are equal, round, and reactive to light. Right eye exhibits no discharge. Left eye exhibits no discharge. No scleral icterus.  Neck: Normal range of motion. Neck supple. No tracheal deviation present. No thyromegaly present.  Cardiovascular: Normal rate, regular rhythm and normal heart sounds.  Exam reveals no gallop and no friction rub.   No murmur heard. Pulmonary/Chest: Effort normal and breath sounds normal. She has no wheezes. She has no rales.  Abdominal: Soft. Bowel sounds are normal. She exhibits no distension and no mass. There is no tenderness. There is no rebound and no guarding.  Musculoskeletal: Normal range of motion. She exhibits no edema.  Lymphadenopathy:    She has no cervical adenopathy.  Neurological: She is alert and oriented to person, place, and time. She has normal reflexes. No cranial nerve deficit. Gait normal. Coordination normal.  Skin: Skin is warm and dry. No rash noted.  Psychiatric: Mood, memory, affect and judgment normal.  Nursing  note and vitals reviewed.   LABORATORY DATA:  I have reviewed the data as listed Lab Results  Component Value Date   WBC 5.6 06/03/2014   HGB 10.8* 06/03/2014   HCT 35.5* 06/03/2014   MCV 82.9 06/03/2014   PLT 287 06/03/2014     Chemistry      Component Value Date/Time   NA 140 06/03/2014 1530   K 3.7 06/03/2014 1530   CL 105 06/03/2014 1530   CO2 26 06/03/2014 1530   BUN 10 06/03/2014 1530   CREATININE 0.55 06/03/2014 1530      Component Value Date/Time   CALCIUM 9.1 06/03/2014 1530   ALKPHOS 71 06/03/2014 1530   AST 25 06/03/2014 1530  ALT 23 06/03/2014 1530   BILITOT 0.6 06/03/2014 1530      ASSESSMENT & PLAN:  Iron deficiency anemia Iron malabsorption Chronic GI related blood loss questionable secondary to internal hemorrhoids Fatigue secondary to iron deficiency  She noticed some improvement in her fatigue in the week after her iron replacement. I recommended obtaining another CBC and ferritin level today. Again the patient does not tolerate oral iron well.  Goal is to keep ferritin level greater than or equal to 100 ng/ml  We will call [facility] [place] [facility] to see if there is anyone there who treats internal hemorrhoids. But we will help the patient find a physician that can help her with this ongoing problem.  Plan on seeing her back in several months with repeat labs. I advised her additional laboratory studies may be ordered sooner depending on how her labs come back today.  Orders Placed This Encounter  Procedures  . CBC with Differential    Standing Status: Future     Number of Occurrences: 1     Standing Expiration Date: 07/08/2015  . Ferritin    Standing Status: Future     Number of Occurrences: 1     Standing Expiration Date: 07/08/2015  . Iron and TIBC    Standing Status: Future     Number of Occurrences: 1     Standing Expiration Date: 07/08/2015    All questions were answered. The patient knows to call the clinic with any problems,  questions or concerns.  This note was electronically signed.    This document serves as a record of services personally performed by Ancil Linsey, MD. It was created on her behalf by Pearlie Oyster, a trained medical scribe. The creation of this record is based on the scribe's personal observations and the provider's statements to them. This document has been checked and approved by the attending provider.    I have reviewed the above documentation for accuracy and completeness, and I agree with the above.  Kelby Fam. Penland MD

## 2014-07-09 ENCOUNTER — Other Ambulatory Visit (HOSPITAL_COMMUNITY): Payer: Self-pay

## 2014-07-09 ENCOUNTER — Other Ambulatory Visit (HOSPITAL_COMMUNITY): Payer: Self-pay | Admitting: Hematology & Oncology

## 2014-07-09 DIAGNOSIS — D509 Iron deficiency anemia, unspecified: Secondary | ICD-10-CM

## 2014-07-16 ENCOUNTER — Encounter (HOSPITAL_BASED_OUTPATIENT_CLINIC_OR_DEPARTMENT_OTHER): Payer: BLUE CROSS/BLUE SHIELD

## 2014-07-16 ENCOUNTER — Encounter (HOSPITAL_COMMUNITY): Payer: Self-pay

## 2014-07-16 VITALS — BP 140/63 | HR 18 | Temp 98.2°F | Resp 16

## 2014-07-16 DIAGNOSIS — K921 Melena: Secondary | ICD-10-CM

## 2014-07-16 DIAGNOSIS — D509 Iron deficiency anemia, unspecified: Secondary | ICD-10-CM

## 2014-07-16 MED ORDER — SODIUM CHLORIDE 0.9 % IJ SOLN: 10.0000 mL | Freq: Once | INTRAMUSCULAR | Status: DC

## 2014-07-16 MED ORDER — SODIUM CHLORIDE 0.9 % IV SOLN
510.0000 mg | Freq: Once | INTRAVENOUS | Status: AC
  Administered 2014-07-16: 510 mg via INTRAVENOUS
  Filled 2014-07-16: qty 17

## 2014-07-16 MED ORDER — SODIUM CHLORIDE 0.9 % IV SOLN
Freq: Once | INTRAVENOUS | Status: AC
  Administered 2014-07-16: 12:00:00 via INTRAVENOUS

## 2014-07-16 NOTE — Progress Notes (Signed)
Tolerated iron infusion well. Reported some lightheadedness at end of infusion which subsided within 20 minutes.

## 2014-07-16 NOTE — Patient Instructions (Signed)
IV iron today

## 2014-08-06 ENCOUNTER — Other Ambulatory Visit (HOSPITAL_COMMUNITY): Payer: Self-pay | Admitting: Internal Medicine

## 2014-08-06 DIAGNOSIS — M810 Age-related osteoporosis without current pathological fracture: Secondary | ICD-10-CM

## 2014-08-12 ENCOUNTER — Ambulatory Visit (HOSPITAL_COMMUNITY)
Admission: RE | Admit: 2014-08-12 | Discharge: 2014-08-12 | Disposition: A | Discharge status: Home / Self Care | Payer: BLUE CROSS/BLUE SHIELD | Source: Ambulatory Visit | Attending: Internal Medicine | Admitting: Internal Medicine

## 2014-08-12 DIAGNOSIS — M810 Age-related osteoporosis without current pathological fracture: Secondary | ICD-10-CM | POA: Insufficient documentation

## 2014-08-12 DIAGNOSIS — Z780 Asymptomatic menopausal state: Secondary | ICD-10-CM | POA: Insufficient documentation

## 2014-08-13 ENCOUNTER — Encounter (HOSPITAL_COMMUNITY): Payer: BLUE CROSS/BLUE SHIELD | Attending: Hematology & Oncology

## 2014-08-13 DIAGNOSIS — D509 Iron deficiency anemia, unspecified: Secondary | ICD-10-CM | POA: Diagnosis present

## 2014-08-13 DIAGNOSIS — K921 Melena: Secondary | ICD-10-CM | POA: Insufficient documentation

## 2014-08-13 LAB — CBC WITH DIFFERENTIAL/PLATELET
BASOS PCT: 1 % (ref 0–1)
Basophils Absolute: 0 10*3/uL (ref 0.0–0.1)
EOS PCT: 4 % (ref 0–5)
Eosinophils Absolute: 0.2 10*3/uL (ref 0.0–0.7)
HEMATOCRIT: 33.7 % — AB (ref 36.0–46.0)
Hemoglobin: 10.7 g/dL — ABNORMAL LOW (ref 12.0–15.0)
LYMPHS ABS: 1.4 10*3/uL (ref 0.7–4.0)
Lymphocytes Relative: 30 % (ref 12–46)
MCH: 27 pg (ref 26.0–34.0)
MCHC: 31.8 g/dL (ref 30.0–36.0)
MCV: 85.1 fL (ref 78.0–100.0)
MONO ABS: 0.3 10*3/uL (ref 0.1–1.0)
MONOS PCT: 7 % (ref 3–12)
NEUTROS PCT: 58 % (ref 43–77)
Neutro Abs: 2.7 10*3/uL (ref 1.7–7.7)
Platelets: 203 10*3/uL (ref 150–400)
RBC: 3.96 MIL/uL (ref 3.87–5.11)
RDW: 16.7 % — ABNORMAL HIGH (ref 11.5–15.5)
WBC: 4.6 10*3/uL (ref 4.0–10.5)

## 2014-08-13 LAB — FERRITIN: FERRITIN: 59 ng/mL (ref 11–307)

## 2014-08-14 ENCOUNTER — Other Ambulatory Visit (HOSPITAL_COMMUNITY): Payer: Self-pay | Admitting: Hematology & Oncology

## 2014-08-14 NOTE — Progress Notes (Signed)
Lab draw

## 2014-08-27 ENCOUNTER — Encounter (HOSPITAL_BASED_OUTPATIENT_CLINIC_OR_DEPARTMENT_OTHER): Payer: BLUE CROSS/BLUE SHIELD

## 2014-08-27 VITALS — BP 108/50 | HR 70 | Temp 97.7°F | Resp 16

## 2014-08-27 DIAGNOSIS — K9089 Other intestinal malabsorption: Secondary | ICD-10-CM

## 2014-08-27 DIAGNOSIS — D509 Iron deficiency anemia, unspecified: Secondary | ICD-10-CM

## 2014-08-27 MED ORDER — SODIUM CHLORIDE 0.9 % IV SOLN
510.0000 mg | Freq: Once | INTRAVENOUS | Status: AC
  Administered 2014-08-27: 510 mg via INTRAVENOUS
  Filled 2014-08-27: qty 17

## 2014-08-27 MED ORDER — SODIUM CHLORIDE 0.9 % IV SOLN
INTRAVENOUS | Status: DC
  Administered 2014-08-27: 13:00:00 via INTRAVENOUS

## 2014-09-07 ENCOUNTER — Encounter (HOSPITAL_COMMUNITY): Payer: BLUE CROSS/BLUE SHIELD | Attending: Hematology & Oncology | Admitting: Hematology & Oncology

## 2014-09-07 ENCOUNTER — Encounter (HOSPITAL_COMMUNITY): Payer: Self-pay | Admitting: Hematology & Oncology

## 2014-09-07 VITALS — BP 123/51 | HR 74 | Temp 98.2°F | Resp 16 | Wt 221.6 lb

## 2014-09-07 DIAGNOSIS — D509 Iron deficiency anemia, unspecified: Secondary | ICD-10-CM | POA: Diagnosis present

## 2014-09-07 DIAGNOSIS — K921 Melena: Secondary | ICD-10-CM | POA: Insufficient documentation

## 2014-09-07 LAB — CBC WITH DIFFERENTIAL/PLATELET
BASOS PCT: 0 % (ref 0–1)
Basophils Absolute: 0 10*3/uL (ref 0.0–0.1)
Eosinophils Absolute: 0.3 10*3/uL (ref 0.0–0.7)
Eosinophils Relative: 4 % (ref 0–5)
HCT: 32.4 % — ABNORMAL LOW (ref 36.0–46.0)
Hemoglobin: 10.6 g/dL — ABNORMAL LOW (ref 12.0–15.0)
LYMPHS ABS: 1.4 10*3/uL (ref 0.7–4.0)
Lymphocytes Relative: 22 % (ref 12–46)
MCH: 28.3 pg (ref 26.0–34.0)
MCHC: 32.7 g/dL (ref 30.0–36.0)
MCV: 86.4 fL (ref 78.0–100.0)
MONOS PCT: 6 % (ref 3–12)
Monocytes Absolute: 0.4 10*3/uL (ref 0.1–1.0)
Neutro Abs: 4.1 10*3/uL (ref 1.7–7.7)
Neutrophils Relative %: 68 % (ref 43–77)
Platelets: 221 10*3/uL (ref 150–400)
RBC: 3.75 MIL/uL — ABNORMAL LOW (ref 3.87–5.11)
RDW: 16.3 % — AB (ref 11.5–15.5)
WBC: 6.1 10*3/uL (ref 4.0–10.5)

## 2014-09-07 LAB — COMPREHENSIVE METABOLIC PANEL
ALBUMIN: 3.8 g/dL (ref 3.5–5.0)
ALK PHOS: 65 U/L (ref 38–126)
ALT: 35 U/L (ref 14–54)
AST: 34 U/L (ref 15–41)
Anion gap: 5 (ref 5–15)
BILIRUBIN TOTAL: 0.5 mg/dL (ref 0.3–1.2)
BUN: 13 mg/dL (ref 6–20)
CO2: 27 mmol/L (ref 22–32)
CREATININE: 0.64 mg/dL (ref 0.44–1.00)
Calcium: 8.8 mg/dL — ABNORMAL LOW (ref 8.9–10.3)
Chloride: 108 mmol/L (ref 101–111)
GFR calc Af Amer: >60 mL/min (ref >60)
GLUCOSE: 129 mg/dL — AB (ref 65–99)
Potassium: 3.7 mmol/L (ref 3.5–5.1)
Sodium: 140 mmol/L (ref 135–145)
Total Protein: 6.8 g/dL (ref 6.5–8.1)

## 2014-09-07 LAB — FERRITIN: Ferritin: 148 ng/mL (ref 11–307)

## 2014-09-07 NOTE — Progress Notes (Signed)
[facility] PROGRESS NOTE  Patient Care Team: Asencion Noble, MD as PCP - General (Internal Medicine) Daneil Dolin, MD as Consulting Physician (Gastroenterology)  CHIEF COMPLAINTS/PURPOSE OF CONSULTATION:  Iron deficiency anemia Last Colonoscopy 11/01/2012 with diverticulosis CBC on 04/28/2013 shows a hemoglobin of 11.3, hematocrit of 35.9, and MCV of 75.7. RDW is elevated at 19.3 CBC dated 03/19/2013 shows a hemoglobin of 9.7, hematocrit of 30.2, MCV of 73.3. Ferritin level of 13 ng/ml on 04/2014  HISTORY OF PRESENTING ILLNESS:  Debra Harris 64 y.o. female is here because of recurrent iron deficiency. She notes she has had multiple colonoscopies, approximately 3 in the past 9 years. She has been diagnosed with internal hemorrhoids and has rare blood in the toilet. She has not done any stool cards recently. She had seen Dr. Romona Curls at his hemorrhoid clinic in [place] many years ago.  She was seen at [facility] and advised that there is no need to treat her hemorrhoids currently. Energy is improved.  She is agreeable to the pill cam procedure. She would rather have the procedure done in [place] with no preference to provider.She has black, tarry, sticky stool every few weeks.  Her left foot is swollen, she says she has given up on the compression stockings for the summer due to the heat.  MEDICAL HISTORY:  Past Medical History  Diagnosis Date  . Diabetes mellitus   . Hyperlipemia   . Allergic rhinitis   . Neuropathy, leg   . Ovarian cyst 11/20/00  . External hemorrhoids   . Adenomatous colon polyp   . GERD (gastroesophageal reflux disease)   . Sliding hiatal hernia   . Arthritis   . Glaucoma   . Diverticulosis     SURGICAL HISTORY: Past Surgical History  Procedure Laterality Date  . Partial hysterectomy  age 8s  . Oophorectomy  age 8s  . Cholecystectomy    . Carpal tunnel release Bilateral   . Thumb surger Bilateral   . Knee surgery Bilateral     SOCIAL  HISTORY: History   Social History  . Marital Status: Married    Spouse Name: J. Orland Dec  . Number of Children: Y  . Years of Education: N/A   Occupational History  . AUDIOLOGIST    Social History Main Topics  . Smoking status: Never Smoker   . Smokeless tobacco: Never Used     Comment: Pt reports tried once in 7th grade//ms  . Alcohol Use: 0.6 oz/week    1 Glasses of wine per week     Comment: rare  . Drug Use: No  . Sexual Activity: Not on file   Other Topics Concern  . Not on file   Social History Narrative   3 children and one grandchild. She has been married for over 40 years. She is a never smoker. She does not drink alcohol. She still is very active and works outside the home.  FAMILY HISTORY: Family History  Problem Relation Age of Onset  . Ulcers Mother   . Colon cancer Neg Hx    has no family status information on file.  Father died when the patient was 72 from suicide. Her mother died at 46 from an MI. She had glaucoma that caused blindness. She has no siblings.   ALLERGIES:  has No Known Allergies.  MEDICATIONS:  Current Outpatient Prescriptions  Medication Sig Dispense Refill  . amitriptyline (ELAVIL) 25 MG tablet Take 50 mg by mouth at bedtime.     Marland Kitchen  cetirizine (ZYRTEC) 10 MG tablet Take 10 mg by mouth daily.     Marland Kitchen DICLOFENAC SODIUM PO Take 1 tablet by mouth 2 (two) times daily.    Marland Kitchen docusate sodium (COLACE) 100 MG capsule Take 100 mg by mouth daily.     . ferrous sulfate 325 (65 FE) MG tablet Take 325 mg by mouth daily. 2 times in am and 2 times in evening    . metFORMIN (GLUCOPHAGE) 500 MG tablet Take 1,000 mg by mouth daily.    Marland Kitchen UNKNOWN TO PATIENT CPAP machine    . atorvastatin (LIPITOR) 40 MG tablet Take by mouth.    . guaifenesin (HUMIBID E) 400 MG TABS tablet Take 400 mg by mouth.    . traMADol (ULTRAM) 50 MG tablet Take 50 mg by mouth every 6 (six) hours as needed.     Marland Kitchen UNKNOWN TO PATIENT Thera tears gel- apply to eyes at bedtime    .  Wheat Dextrin (BENEFIBER) POWD 2 heaping teaspoons daily (Patient not taking: Reported on 06/03/2014) 1 g 0   No current facility-administered medications for this visit.    Review of Systems  Constitutional: Negative. HENT: Negative.   Eyes: Negative.   Respiratory: Negative.   Cardiovascular: Negative.   Gastrointestinal: Negative.   Genitourinary: Negative.   Musculoskeletal: Negative Left foot swollen. Skin: Negative.   Neurological: Negative for tingling, tremors, sensory change, speech change, focal weakness, seizures and loss of consciousness.  Endo/Heme/Allergies: Negative.   14 point review of systems was performed and is negative except as detailed under history of present illness and above   PHYSICAL EXAMINATION: ECOG PERFORMANCE STATUS: 0 - Asymptomatic  Filed Vitals:   09/07/14 1356  BP: 123/51  Pulse: 74  Temp: 98.2 F (36.8 C)  Resp: 16   Filed Weights   09/07/14 1356  Weight: 221 lb 9.6 oz (100.517 kg)    Physical Exam  Constitutional: She is oriented to person, place, and time and well-developed, well-nourished, and in no distress.  HENT:  Head: Normocephalic and atraumatic.  Nose: Nose normal.  Mouth/Throat: Oropharynx is clear and moist. No oropharyngeal exudate.  Eyes: Conjunctivae and EOM are normal. Pupils are equal, round, and reactive to light. Right eye exhibits no discharge. Left eye exhibits no discharge. No scleral icterus.  Neck: Normal range of motion. Neck supple. No tracheal deviation present. No thyromegaly present.  Cardiovascular: Normal rate, regular rhythm and normal heart sounds.  Exam reveals no gallop and no friction rub.   No murmur heard. Pulmonary/Chest: Effort normal and breath sounds normal. She has no wheezes. She has no rales.  Abdominal: Soft. Bowel sounds are normal. She exhibits no distension and no mass. There is no tenderness. There is no rebound and no guarding.  Musculoskeletal: Normal range of motion.  Left foot  edema. (chronic) Lymphadenopathy:    She has no cervical adenopathy.  Neurological: She is alert and oriented to person, place, and time. She has normal reflexes. No cranial nerve deficit. Gait normal. Coordination normal.  Skin: Skin is warm and dry. No rash noted.  Psychiatric: Mood, memory, affect and judgment normal.  Nursing note and vitals reviewed.   LABORATORY DATA:  I have reviewed the data as listed Lab Results  Component Value Date   WBC 6.1 09/07/2014   HGB 10.6* 09/07/2014   HCT 32.4* 09/07/2014   MCV 86.4 09/07/2014   PLT 221 09/07/2014     Chemistry      Component Value Date/Time   NA 140  09/07/2014 1420   K 3.7 09/07/2014 1420   CL 108 09/07/2014 1420   CO2 27 09/07/2014 1420   BUN 13 09/07/2014 1420   CREATININE 0.64 09/07/2014 1420      Component Value Date/Time   CALCIUM 8.8* 09/07/2014 1420   ALKPHOS 65 09/07/2014 1420   AST 34 09/07/2014 1420   ALT 35 09/07/2014 1420   BILITOT 0.5 09/07/2014 1420      ASSESSMENT & PLAN:  Iron deficiency anemia Iron malabsorption Chronic GI related blood loss questionable secondary to internal hemorrhoids Fatigue secondary to iron deficiency  She noticed some improvement in her fatigue in the week after her iron replacement. I recommended obtaining another CBC and ferritin level today. Again the patient does not tolerate oral iron well.  Goal is to keep ferritin level greater than or equal to 100 ng/ml  Her blood counts have fallen and pending her ferritin today, I am recommending a referral to GI for additional evaluation.  I discussed the possibility of AVM's or other potential sources of GI related blood loss aside from her hemorrhoids.   I will see her back again in one month with labs.   Orders Placed This Encounter  Procedures  . CBC with Differential  . Comprehensive metabolic panel  . Ferritin    All questions were answered. The patient knows to call the clinic with any problems, questions or  concerns.  This note was electronically signed.    This document serves as a record of services personally performed by Ancil Linsey, MD. It was created on her behalf by Arlyce Harman, a trained medical scribe. The creation of this record is based on the scribe's personal observations and the provider's statements to them. This document has been checked and approved by the attending provider.  I have reviewed the above documentation for accuracy and completeness, and I agree with the above.  Kelby Fam. Darrion Macaulay MD

## 2014-09-07 NOTE — Patient Instructions (Signed)
[place] at [facility] Discharge Instructions  RECOMMENDATIONS MADE BY THE CONSULTANT AND ANY TEST RESULTS WILL BE SENT TO YOUR REFERRING PHYSICIAN.  Exam and discussion by Dr. Whitney Muse Will make referral for GI consultation Labs today Follow-up in 1 month.  Thank you for choosing [place] at [facility] to provide your oncology and hematology care.  To afford each patient quality time with our provider, please arrive at least 15 minutes before your scheduled appointment time.    You need to re-schedule your appointment should you arrive 10 or more minutes late.  We strive to give you quality time with our providers, and arriving late affects you and other patients whose appointments are after yours.  Also, if you no show three or more times for appointments you may be dismissed from the clinic at the providers discretion.     Again, thank you for choosing [facility].  Our hope is that these requests will decrease the amount of time that you wait before being seen by our physicians.       _____________________________________________________________  Should you have questions after your visit to [facility], please contact our office at [phone] between the hours of 8:30 a.m. and 4:30 p.m.  Voicemails left after 4:30 p.m. will not be returned until the following business day.  For prescription refill requests, have your pharmacy contact our office.

## 2014-10-09 ENCOUNTER — Encounter (HOSPITAL_COMMUNITY): Payer: BLUE CROSS/BLUE SHIELD | Attending: Hematology & Oncology | Admitting: Hematology & Oncology

## 2014-10-09 VITALS — BP 128/54 | HR 78 | Temp 97.7°F | Resp 18 | Wt 212.5 lb

## 2014-10-09 DIAGNOSIS — D509 Iron deficiency anemia, unspecified: Secondary | ICD-10-CM | POA: Diagnosis not present

## 2014-10-09 DIAGNOSIS — K921 Melena: Secondary | ICD-10-CM

## 2014-10-09 LAB — COMPREHENSIVE METABOLIC PANEL
ALBUMIN: 4.2 g/dL (ref 3.5–5.0)
ALK PHOS: 70 U/L (ref 38–126)
ALT: 38 U/L (ref 14–54)
AST: 35 U/L (ref 15–41)
Anion gap: 8 (ref 5–15)
BUN: 14 mg/dL (ref 6–20)
CHLORIDE: 105 mmol/L (ref 101–111)
CO2: 27 mmol/L (ref 22–32)
Calcium: 9.2 mg/dL (ref 8.9–10.3)
Creatinine, Ser: 0.84 mg/dL (ref 0.44–1.00)
GFR calc Af Amer: >60 mL/min (ref >60)
GFR calc non Af Amer: >60 mL/min (ref >60)
Glucose, Bld: 110 mg/dL — ABNORMAL HIGH (ref 65–99)
Potassium: 4 mmol/L (ref 3.5–5.1)
Sodium: 140 mmol/L (ref 135–145)
Total Bilirubin: 0.6 mg/dL (ref 0.3–1.2)
Total Protein: 7.5 g/dL (ref 6.5–8.1)

## 2014-10-09 LAB — CBC WITH DIFFERENTIAL/PLATELET
Basophils Absolute: 0 10*3/uL (ref 0.0–0.1)
Basophils Relative: 1 % (ref 0–1)
Eosinophils Absolute: 0.2 10*3/uL (ref 0.0–0.7)
Eosinophils Relative: 3 % (ref 0–5)
HEMATOCRIT: 36.7 % (ref 36.0–46.0)
HEMOGLOBIN: 11.4 g/dL — AB (ref 12.0–15.0)
LYMPHS ABS: 1.6 10*3/uL (ref 0.7–4.0)
LYMPHS PCT: 26 % (ref 12–46)
MCH: 27.6 pg (ref 26.0–34.0)
MCHC: 31.1 g/dL (ref 30.0–36.0)
MCV: 88.9 fL (ref 78.0–100.0)
MONOS PCT: 7 % (ref 3–12)
Monocytes Absolute: 0.4 10*3/uL (ref 0.1–1.0)
NEUTROS ABS: 3.9 10*3/uL (ref 1.7–7.7)
Neutrophils Relative %: 63 % (ref 43–77)
Platelets: 290 10*3/uL (ref 150–400)
RBC: 4.13 MIL/uL (ref 3.87–5.11)
RDW: 14.7 % (ref 11.5–15.5)
WBC: 6 10*3/uL (ref 4.0–10.5)

## 2014-10-09 LAB — IRON AND TIBC
IRON: 45 ug/dL (ref 28–170)
SATURATION RATIOS: 10 % — AB (ref 10.4–31.8)
TIBC: 461 ug/dL — ABNORMAL HIGH (ref 250–450)
UIBC: 416 ug/dL

## 2014-10-09 LAB — FERRITIN
Ferritin: 24 ng/mL (ref 11–307)
Ferritin: 25 ng/mL (ref 11–307)

## 2014-10-09 NOTE — Patient Instructions (Signed)
[place] at [facility] Discharge Instructions  RECOMMENDATIONS MADE BY THE CONSULTANT AND ANY TEST RESULTS WILL BE SENT TO YOUR REFERRING PHYSICIAN.  Blood work every 6 weeks  Return to see Dr. Whitney Muse in 6 months (January)  Thank you for choosing [place] at [facility] to provide your oncology and hematology care.  To afford each patient quality time with our provider, please arrive at least 15 minutes before your scheduled appointment time.    You need to re-schedule your appointment should you arrive 10 or more minutes late.  We strive to give you quality time with our providers, and arriving late affects you and other patients whose appointments are after yours.  Also, if you no show three or more times for appointments you may be dismissed from the clinic at the providers discretion.     Again, thank you for choosing [facility].  Our hope is that these requests will decrease the amount of time that you wait before being seen by our physicians.       _____________________________________________________________  Should you have questions after your visit to [facility], please contact our office at [phone] between the hours of 8:30 a.m. and 4:30 p.m.  Voicemails left after 4:30 p.m. will not be returned until the following business day.  For prescription refill requests, have your pharmacy contact our office.

## 2014-10-09 NOTE — Progress Notes (Signed)
[place] PROGRESS NOTE  Patient Care Team: Asencion Noble, MD as PCP - General (Internal Medicine) Daneil Dolin, MD as Consulting Physician (Gastroenterology)  CHIEF COMPLAINTS/PURPOSE OF CONSULTATION:  Iron deficiency anemia Last Colonoscopy 11/01/2012 with diverticulosis CBC on 04/28/2013 shows a hemoglobin of 11.3, hematocrit of 35.9, and MCV of 75.7. RDW is elevated at 19.3 CBC dated 03/19/2013 shows a hemoglobin of 9.7, hematocrit of 30.2, MCV of 73.3. Ferritin level of 13 ng/ml on 04/2014  HISTORY OF PRESENTING ILLNESS:  Debra Harris 64 y.o. female is here because of recurrent iron deficiency. She notes she has had multiple colonoscopies, approximately 3 in the past 9 years. She has been diagnosed with internal hemorrhoids and has rare blood in the toilet. She has not done any stool cards recently. She had seen Dr. Romona Curls at his hemorrhoid clinic in [place] many years ago.  She was seen at [facility] and advised that there is no need to treat her hemorrhoids currently. Energy is improved.  The patient says that she has more energy and is doing a lot better.  She is having no problems with her IV's or infusions.  Her appetite and sleeping patterns are consistent.  Her legs are about the same.  She has an appointment for an EGD with Dr. Sydell Axon on Monday.   MEDICAL HISTORY:  Past Medical History  Diagnosis Date  . Diabetes mellitus   . Allergic rhinitis   . Neuropathy, leg   . Ovarian cyst 11/20/00  . External hemorrhoids   . Adenomatous colon polyp   . GERD (gastroesophageal reflux disease)   . Sliding hiatal hernia   . Arthritis   . Glaucoma   . Diverticulosis   . IDA (iron deficiency anemia)   . Low tension glaucoma   . Sleep apnea     CPAP    SURGICAL HISTORY: Past Surgical History  Procedure Laterality Date  . Partial hysterectomy  age 82s  . Oophorectomy  age 71s  . Cholecystectomy    . Carpal tunnel release Bilateral   . Thumb surger Bilateral     trigger thumb  . Knee surgery      right-arthroscopy  . Cataract extraction      bilateral  . Colonoscopy  2005    Small external hemorrhoids, single sigmoid colon diverticulum, small polyp at the hepatic flexure focally adenomatous.  . Colonoscopy  2010    Dr. Gala Romney, Versed 4/Demerol 75. Pancolonic diverticula, external hemorrhoids. Colonoscopy every 5 years for history of adenomatous polyps  . Colonoscopy  2014    Dr. Olevia Perches: Propofol. mild diverticulosis in the sigmoid colon, random colon biopsies benign.  . Esophagogastroduodenoscopy  2010    Dr. Gala Romney, distal esophageal erosions, [facility] dilator passed. Comment small hiatal hernia, patulous EG junction.  . Esophagogastroduodenoscopy (egd) with propofol N/A 10/26/2014    Procedure: ESOPHAGOGASTRODUODENOSCOPY (EGD) WITH PROPOFOL;  Surgeon: Daneil Dolin, MD;  Location: [facility];  Service: Endoscopy;  Laterality: N/A;  . Esophageal dilation N/A 10/26/2014    Procedure: ESOPHAGEAL DILATION [place];  Surgeon: Daneil Dolin, MD;  Location: [facility];  Service: Endoscopy;  Laterality: N/A;  . Givens capsule study N/A 10/26/2014    Procedure: GIVENS CAPSULE STUDY INSERTED AT 9924;  Surgeon: Daneil Dolin, MD;  Location: [facility];  Service: Endoscopy;  Laterality: N/A;  . Rectal exam under anesthesia N/A 10/26/2014    Procedure: RECTAL EXAM UNDER ANESTHESIA;  Surgeon: Daneil Dolin, MD;  Location: [facility];  Service:  Endoscopy;  Laterality: N/A;    SOCIAL HISTORY: Social History   Social History  . Marital Status: Married    Spouse Name: J. Orland Dec  . Number of Children: Y  . Years of Education: N/A   Occupational History  . AUDIOLOGIST    Social History Main Topics  . Smoking status: Never Smoker   . Smokeless tobacco: Never Used     Comment: Pt reports tried once in 7th grade//ms  . Alcohol Use: 0.6 oz/week    1 Glasses of wine per week     Comment: rare  . Drug Use: No  . Sexual Activity: No   Other Topics Concern  . Not  on file   Social History Narrative   3 children      3 children and one grandchild. She has been married for over 40 years. She is a never smoker. She does not drink alcohol. She still is very active and works outside the home.  FAMILY HISTORY: Family History  Problem Relation Age of Onset  . Ulcers Mother   . Colon cancer Neg Hx   . Glaucoma Mother   . Glaucoma Maternal Grandmother    has no family status information on file.  Father died when the patient was 11 from suicide. Her mother died at 67 from an MI. She had glaucoma that caused blindness. She has no siblings.   ALLERGIES:  has No Known Allergies.  MEDICATIONS:  Current Outpatient Prescriptions  Medication Sig Dispense Refill  . amitriptyline (ELAVIL) 25 MG tablet Take 50 mg by mouth at bedtime.     . cetirizine (ZYRTEC) 10 MG tablet Take 10 mg by mouth daily.     Marland Kitchen DICLOFENAC SODIUM PO Take 1 tablet by mouth 2 (two) times daily.    Marland Kitchen docusate sodium (COLACE) 100 MG capsule Take 100 mg by mouth daily.     . ferrous sulfate 325 (65 FE) MG tablet Take 325 mg by mouth daily. 1 time in am and 1 time in evening    . metFORMIN (GLUCOPHAGE) 500 MG tablet Take 1,000 mg by mouth daily.    Marland Kitchen UNKNOWN TO PATIENT CPAP machine    . UNKNOWN TO PATIENT Thera tears gel- apply to eyes at bedtime    . Wheat Dextrin (BENEFIBER) POWD 2 heaping teaspoons daily 1 g 0  . hydrocortisone (ANUSOL-HC) 25 MG suppository Place 25 mg rectally 2 (two) times daily.    . Probiotic Product (TRUNATURE DIGESTIVE PROBIOTIC PO) Take 1 capsule by mouth daily.     No current facility-administered medications for this visit.    Review of Systems  Constitutional: Negative. HENT: Negative.   Eyes: Negative.   Respiratory: Negative.   Cardiovascular: Negative.   Gastrointestinal: Negative.   Genitourinary: Negative.   Musculoskeletal: Negative Left foot swollen. Skin: Negative.   Neurological: Negative for tingling, tremors, sensory change, speech  change, focal weakness, seizures and loss of consciousness.  Endo/Heme/Allergies: Negative.   14 point review of systems was performed and is negative except as detailed under history of present illness and above   PHYSICAL EXAMINATION: ECOG PERFORMANCE STATUS: 0 - Asymptomatic  Filed Vitals:   10/09/14 1339  BP: 128/54  Pulse: 78  Temp: 97.7 F (36.5 C)  Resp: 18   Filed Weights   10/09/14 1339  Weight: 212 lb 8 oz (96.389 kg)    Physical Exam  Constitutional: She is oriented to person, place, and time and well-developed, well-nourished, and in no distress.  HENT:  Head: Normocephalic and atraumatic.  Nose: Nose normal.  Mouth/Throat: Oropharynx is clear and moist. No oropharyngeal exudate.  Eyes: Conjunctivae and EOM are normal. Pupils are equal, round, and reactive to light. Right eye exhibits no discharge. Left eye exhibits no discharge. No scleral icterus.  Neck: Normal range of motion. Neck supple. No tracheal deviation present. No thyromegaly present.  Cardiovascular: Normal rate, regular rhythm and normal heart sounds.  Exam reveals no gallop and no friction rub.   No murmur heard. Pulmonary/Chest: Effort normal and breath sounds normal. She has no wheezes. She has no rales.  Abdominal: Soft. Bowel sounds are normal. She exhibits no distension and no mass. There is no tenderness. There is no rebound and no guarding.  Musculoskeletal: Normal range of motion.  Left foot edema. (chronic) Lymphadenopathy:    She has no cervical adenopathy.  Neurological: She is alert and oriented to person, place, and time. She has normal reflexes. No cranial nerve deficit. Gait normal. Coordination normal.  Skin: Skin is warm and dry. No rash noted.  Psychiatric: Mood, memory, affect and judgment normal.  Nursing note and vitals reviewed.   LABORATORY DATA:  I have reviewed the data as listed Lab Results  Component Value Date   WBC 6.0 10/09/2014   HGB 11.4* 10/09/2014   HCT 36.7  10/09/2014   MCV 88.9 10/09/2014   PLT 290 10/09/2014     Chemistry      Component Value Date/Time   NA 140 10/09/2014 1435   K 4.0 10/09/2014 1435   CL 105 10/09/2014 1435   CO2 27 10/09/2014 1435   BUN 14 10/09/2014 1435   CREATININE 0.84 10/09/2014 1435      Component Value Date/Time   CALCIUM 9.2 10/09/2014 1435   ALKPHOS 70 10/09/2014 1435   AST 35 10/09/2014 1435   ALT 38 10/09/2014 1435   BILITOT 0.6 10/09/2014 1435      ASSESSMENT & PLAN:  Iron deficiency anemia Iron malabsorption Chronic GI related blood loss questionable secondary to internal hemorrhoids Fatigue secondary to iron deficiency  She noticed some improvement in her fatigue in the week after her iron replacement. I recommended obtaining another CBC and ferritin level today. Again the patient does not tolerate oral iron well.  Goal is to keep ferritin level greater than or equal to 100 ng/ml  She has an upcoming EGD with Dr. Gala Romney. I have recommended doing close surveillance labs. We will check a CBC and ferritin every 6 weeks. She is currently needing iron quite frequently and I suspect some chronic GI related blood loss. I will keep her apprised of her ferritin level and it is available. We will continue to replace her iron IV accordingly.  Orders Placed This Encounter  Procedures  . CBC with Differential  . Comprehensive metabolic panel  . Ferritin  . Iron and TIBC  . CBC with Differential    Standing Status: Standing     Number of Occurrences: 18     Standing Expiration Date: 10/08/2016  . Ferritin    Standing Status: Standing     Number of Occurrences: 18     Standing Expiration Date: 10/08/2016    All questions were answered. The patient knows to call the clinic with any problems, questions or concerns.   This note was electronically signed.    This document serves as a record of services personally performed by Ancil Linsey, MD. It was created on her behalf by Janace Hoard, a  trained medical scribe. The  creation of this record is based on the scribe's personal observations and the provider's statements to them. This document has been checked and approved by the attending provider.  I have reviewed the above documentation for accuracy and completeness, and I agree with the above.  Kelby Fam. Perina Salvaggio MD

## 2014-10-11 ENCOUNTER — Other Ambulatory Visit (HOSPITAL_COMMUNITY): Payer: Self-pay | Admitting: Hematology & Oncology

## 2014-10-12 ENCOUNTER — Encounter: Payer: Self-pay | Admitting: Gastroenterology

## 2014-10-12 ENCOUNTER — Other Ambulatory Visit: Payer: Self-pay

## 2014-10-12 ENCOUNTER — Telehealth: Payer: Self-pay

## 2014-10-12 ENCOUNTER — Ambulatory Visit (INDEPENDENT_AMBULATORY_CARE_PROVIDER_SITE_OTHER): Payer: BLUE CROSS/BLUE SHIELD | Admitting: Gastroenterology

## 2014-10-12 ENCOUNTER — Telehealth: Payer: Self-pay | Admitting: Gastroenterology

## 2014-10-12 VITALS — BP 140/81 | HR 72 | Temp 97.4°F | Ht 63.0 in | Wt 213.2 lb

## 2014-10-12 DIAGNOSIS — R1310 Dysphagia, unspecified: Secondary | ICD-10-CM

## 2014-10-12 DIAGNOSIS — K921 Melena: Secondary | ICD-10-CM | POA: Diagnosis not present

## 2014-10-12 DIAGNOSIS — D509 Iron deficiency anemia, unspecified: Secondary | ICD-10-CM

## 2014-10-12 DIAGNOSIS — D126 Benign neoplasm of colon, unspecified: Secondary | ICD-10-CM

## 2014-10-12 DIAGNOSIS — K219 Gastro-esophageal reflux disease without esophagitis: Secondary | ICD-10-CM

## 2014-10-12 DIAGNOSIS — R1032 Left lower quadrant pain: Secondary | ICD-10-CM

## 2014-10-12 NOTE — Telephone Encounter (Signed)
Pt records faxed to [facility].  Pending reference number is [id number]

## 2014-10-12 NOTE — Telephone Encounter (Signed)
PATIENT FORGOT TO TELL LSL THAT SHE BELIEVES THE ARTHRITIS MEDICINE CAUSED SOME OF THE BLEEDING, SHE STOPPED TAKING THE ARTHRITIS BUT SHE WAS STILL BLEEDING.  ABOUT 2 MONTHS AGO.

## 2014-10-12 NOTE — Patient Instructions (Signed)
1. Upper endoscopy and small bowel capsule endoscopy with Dr. Gala Romney. See separate instructions.

## 2014-10-12 NOTE — Progress Notes (Signed)
Primary Care Physician:  Asencion Noble, MD  Primary Gastroenterologist:  Garfield Cornea, MD   Chief Complaint  Patient presents with  . Anemia    HPI:  Debra Harris is a 64 y.o. female here for further evaluation of iron deficiency anemia. Noted to have microcytic anemia back in 2014. Iron was 39 at the time. Ferritin not performed. Hemoglobin 9.2, MCV 76 in 03/04/2013. Patient reportedly took oral iron for over a year without any significant benefit. Was seen by hematology in April. Ferritin 12. Iron indices consistent with IDA. Hemoglobin 10.8 at the time. She subsequently received at least 3 iron infusions per her report. Hemoccults status unknown. She sees frequent rectal bleeding, she feels like is a moderate amount, occasionally with blood clots. Anucort seemed to improve the bleeding. Stools are very dark. She's not sure whether it's melena or iron. She has chronic left lower quadrant pain sometimes stabbing in quality. Often improved with a bowel movement. Seen here locally and by Dr. Olevia Perches for this symptom. Thought to be IBS related. Symptoms present for years. Last CT imaging in October 2015 with fatty liver but no other abnormalities. Generally has a bowel movement most days. Takes Colace to keep her stool soft. Takes omeprazole as needed for heartburn. Does not need daily. Denies vomiting, unintentional weight loss. Recently she has noticed some dysphagia to solid foods. Patient reminds me that she has a history of growing benign tumors. She questions if this may be the etiology of her left lower quadrant pain. Nothing seen on CT in 2015.  EGD with colonoscopy November 2010 by Dr. Gala Romney (Versed 4 mg/Demerol 75 mg). Distal esophageal erosions, patulous EG junction, Maloney dilator empirically, small hiatal hernia. Pancolonic diverticula, external hemorrhoids, otherwise colon appeared normal.  Colonoscopy September 2014 by Dr. Delfin Edis. Mild diverticulosis in the sigmoid colon, random  colon biopsies negative.      Current Outpatient Prescriptions  Medication Sig Dispense Refill  . amitriptyline (ELAVIL) 25 MG tablet Take 50 mg by mouth at bedtime.     . cetirizine (ZYRTEC) 10 MG tablet Take 10 mg by mouth daily.     Marland Kitchen DICLOFENAC SODIUM PO Take 1 tablet by mouth 2 (two) times daily.    Marland Kitchen docusate sodium (COLACE) 100 MG capsule Take 100 mg by mouth daily.     . ferrous sulfate 325 (65 FE) MG tablet Take 325 mg by mouth daily. 1 time in am and 1 time in evening    . hydrocortisone (ANUSOL-HC) 25 MG suppository Place 25 mg rectally 2 (two) times daily.    . metFORMIN (GLUCOPHAGE) 500 MG tablet Take 1,000 mg by mouth daily.    . Probiotic Product (TRUNATURE DIGESTIVE PROBIOTIC PO) Take 1 capsule by mouth daily.    Marland Kitchen UNKNOWN TO PATIENT CPAP machine    . UNKNOWN TO PATIENT Thera tears gel- apply to eyes at bedtime    . Wheat Dextrin (BENEFIBER) POWD 2 heaping teaspoons daily 1 g 0   No current facility-administered medications for this visit.    Allergies as of 10/12/2014  . (No Known Allergies)    Past Medical History  Diagnosis Date  . Diabetes mellitus   . Allergic rhinitis   . Neuropathy, leg   . Ovarian cyst 11/20/00  . External hemorrhoids   . Adenomatous colon polyp   . GERD (gastroesophageal reflux disease)   . Sliding hiatal hernia   . Arthritis   . Glaucoma   . Diverticulosis   . IDA (iron deficiency  anemia)     Past Surgical History  Procedure Laterality Date  . Partial hysterectomy  age 82s  . Oophorectomy  age 29s  . Cholecystectomy    . Carpal tunnel release Bilateral   . Thumb surger Bilateral   . Knee surgery      right  . Cataract extraction      bilateral  . Colonoscopy  2005    Small external hemorrhoids, single sigmoid colon diverticulum, small polyp at the hepatic flexure focally adenomatous.  . Colonoscopy  2010    Dr. Gala Romney, Versed 4/Demerol 75. Pancolonic diverticula, external hemorrhoids. Colonoscopy every 5 years for  history of adenomatous polyps  . Colonoscopy  2014    Dr. Olevia Perches: Propofol. mild diverticulosis in the sigmoid colon, random colon biopsies benign.  . Esophagogastroduodenoscopy  2010    Dr. Gala Romney, distal esophageal erosions, [facility] dilator passed. Comment small hiatal hernia, patulous EG junction.    Family History  Problem Relation Age of Onset  . Ulcers Mother   . Colon cancer Neg Hx   . Glaucoma Mother   . Glaucoma Maternal Grandmother     Social History   Social History  . Marital Status: Married    Spouse Name: J. Orland Dec  . Number of Children: Y  . Years of Education: N/A   Occupational History  . AUDIOLOGIST    Social History Main Topics  . Smoking status: Never Smoker   . Smokeless tobacco: Never Used     Comment: Pt reports tried once in 7th grade//ms  . Alcohol Use: 0.6 oz/week    1 Glasses of wine per week     Comment: rare  . Drug Use: No  . Sexual Activity: Not on file   Other Topics Concern  . Not on file   Social History Narrative   3 children         ROS:  General: Negative for anorexia, weight loss, fever, chills, fatigue, weakness. Eyes: Negative for vision changes.  ENT: Negative for hoarseness,   nasal congestion. CV: Negative for chest pain, angina, palpitations, dyspnea on exertion, peripheral edema.  Respiratory: Negative for dyspnea at rest, dyspnea on exertion, cough, sputum, wheezing.  GI: See history of present illness. GU:  Negative for dysuria, hematuria, urinary incontinence, urinary frequency, nocturnal urination.  MS: Negative for joint pain, low back pain.  Derm: Negative for rash or itching.  Neuro: Negative for weakness, abnormal sensation, seizure, frequent headaches, memory loss, confusion.  Psych: Negative for anxiety, depression, suicidal ideation, hallucinations.  Endo: Negative for unusual weight change.  Heme: Negative for bruising or bleeding. Allergy: Negative for rash or hives.    Physical  Examination:  BP 140/81 mmHg  Pulse 72  Temp(Src) 97.4 F (36.3 C)  Ht 5\' 3"  (1.6 m)  Wt 213 lb 3.2 oz (96.707 kg)  BMI 37.78 kg/m2   General: Well-nourished, well-developed in no acute distress.  Head: Normocephalic, atraumatic.   Eyes: Conjunctiva pink, no icterus. Mouth: Oropharyngeal mucosa moist and pink , no lesions erythema or exudate. Neck: Supple without thyromegaly, masses, or lymphadenopathy.  Lungs: Clear to auscultation bilaterally.  Heart: Regular rate and rhythm, no murmurs rubs or gallops.  Abdomen: Bowel sounds are normal, LLQ mild tenderness, nondistended, no hepatosplenomegaly or masses, no abdominal bruits or    hernia , no rebound or guarding.   Rectal: not performed Extremities: No lower extremity edema. No clubbing or deformities.  Neuro: Alert and oriented x 4 , grossly normal neurologically.  Skin: Warm  and dry, no rash or jaundice.   Psych: Alert and cooperative, normal mood and affect.  Labs: Lab Results  Component Value Date   WBC 6.0 10/09/2014   HGB 11.4* 10/09/2014   HCT 36.7 10/09/2014   MCV 88.9 10/09/2014   PLT 290 10/09/2014   Lab Results  Component Value Date   CREATININE 0.84 10/09/2014   BUN 14 10/09/2014   NA 140 10/09/2014   K 4.0 10/09/2014   CL 105 10/09/2014   CO2 27 10/09/2014   Lab Results  Component Value Date   ALT 38 10/09/2014   AST 35 10/09/2014   ALKPHOS 70 10/09/2014   BILITOT 0.6 10/09/2014   Lab Results  Component Value Date   IRON 45 10/09/2014   TIBC 461* 10/09/2014   FERRITIN 25 10/09/2014   FERRITIN 24 10/09/2014     Imaging Studies: No results found.

## 2014-10-13 ENCOUNTER — Telehealth: Payer: Self-pay | Admitting: Gastroenterology

## 2014-10-13 ENCOUNTER — Encounter: Payer: Self-pay | Admitting: Gastroenterology

## 2014-10-13 ENCOUNTER — Other Ambulatory Visit: Payer: Self-pay

## 2014-10-13 NOTE — Telephone Encounter (Signed)
Pt is on for the same day but the time did change to 9:00. I have mailed her the prep-op appointment.

## 2014-10-13 NOTE — Telephone Encounter (Signed)
Noted  

## 2014-10-13 NOTE — Telephone Encounter (Signed)
Please let patient know that I discussed her case with Dr. Gala Romney. He agrees with need for upper endoscopy with possible dilation (for dysphagia) and dropping capsule down at same time.   He wants to have this done with deep sedation in OR rather than endo as scheduled. Reason: recommendation since capsule deployment planned.   Can we keep same date as scheduled but move her to OR? Can you ask patient if esophageal dilation helped her back in 2010?

## 2014-10-13 NOTE — Telephone Encounter (Signed)
Also, she uses CPAP for sleep apnea.

## 2014-10-14 ENCOUNTER — Encounter (HOSPITAL_COMMUNITY): Payer: Self-pay

## 2014-10-14 ENCOUNTER — Encounter (HOSPITAL_BASED_OUTPATIENT_CLINIC_OR_DEPARTMENT_OTHER): Payer: BLUE CROSS/BLUE SHIELD

## 2014-10-14 DIAGNOSIS — D509 Iron deficiency anemia, unspecified: Secondary | ICD-10-CM

## 2014-10-14 MED ORDER — SODIUM CHLORIDE 0.9 % IV SOLN
INTRAVENOUS | Status: DC
  Administered 2014-10-14: 14:00:00 via INTRAVENOUS

## 2014-10-14 MED ORDER — FERUMOXYTOL INJECTION 510 MG/17 ML
510.0000 mg | Freq: Once | INTRAVENOUS | Status: AC
  Administered 2014-10-14: 510 mg via INTRAVENOUS
  Filled 2014-10-14: qty 17

## 2014-10-14 NOTE — Progress Notes (Signed)
Debra Harris Tolerated iron infusion well discharged ambulatory

## 2014-10-14 NOTE — Patient Instructions (Signed)
[facility] at [facility] Discharge Instructions  RECOMMENDATIONS MADE BY THE CONSULTANT AND ANY TEST RESULTS WILL BE SENT TO YOUR REFERRING PHYSICIAN.  IV iron infusion today Return as scheduled Please call the clinic if you have any questions or concerns  Thank you for choosing [facility] at [facility] to provide your oncology and hematology care.  To afford each patient quality time with our provider, please arrive at least 15 minutes before your scheduled appointment time.    You need to re-schedule your appointment should you arrive 10 or more minutes late.  We strive to give you quality time with our providers, and arriving late affects you and other patients whose appointments are after yours.  Also, if you no show three or more times for appointments you may be dismissed from the clinic at the providers discretion.     Again, thank you for choosing [facility].  Our hope is that these requests will decrease the amount of time that you wait before being seen by our physicians.       _____________________________________________________________  Should you have questions after your visit to [facility], please contact our office at [phone] between the hours of 8:30 a.m. and 4:30 p.m.  Voicemails left after 4:30 p.m. will not be returned until the following business day.  For prescription refill requests, have your pharmacy contact our office.     

## 2014-10-16 DIAGNOSIS — D126 Benign neoplasm of colon, unspecified: Secondary | ICD-10-CM | POA: Insufficient documentation

## 2014-10-16 NOTE — Assessment & Plan Note (Signed)
Patient not interested in pursuing colonoscopy at this time. Next colonoscopy is due in 2019.

## 2014-10-16 NOTE — Assessment & Plan Note (Signed)
Chronic left lower quadrant pain evaluated here and by Dr. Olevia Perches. Etiology unclear, questionable IBS related. Anti-spasmodics have been on hold because of her history of glaucoma. Await pending EGD and capsule study. Further workup as needed based on findings.

## 2014-10-16 NOTE — Assessment & Plan Note (Signed)
Iron deficiency anemia requiring iron infusions. Last colonoscopy September 2014 by Dr. Delfin Edis. Last EGD in 2010 with distal esophageal erosions, Maloney dilator. Good. Patient complains of dark stool, questionable melena versus related to oral iron. Complains of solid food dysphagia. She is on mobic regularly. Cannot exclude upper GI source as needed etiology for her iron deficiency. Of note, patient has intermittent rectal bleeding colonoscopy up-to-date at this point. She believes is from hemorrhoids. She is not interested in colonoscopy at this time. I discussed the above case with Dr. Gala Romney. Plans for upper endoscopy with possible esophageal dilation and Givens capsule placement. I have discussed the risks, alternatives, benefits with regards to but not limited to the risk of reaction to medication, bleeding, infection, perforation and the patient is agreeable to proceed. Written consent to be obtained.

## 2014-10-19 NOTE — Progress Notes (Signed)
cc'ed to pcp °

## 2014-10-19 NOTE — Telephone Encounter (Signed)
[employer] approved PA# [id number]

## 2014-10-19 NOTE — Telephone Encounter (Signed)
[employer] approved.

## 2014-10-20 NOTE — Patient Instructions (Signed)
Debra Harris  10/20/2014     @PREFPERIOPPHARMACY @   Your procedure is scheduled on  10/26/2014  Report to [facility] at  730  A.M.  Call this number if you have problems the morning of surgery:  [phone]   Remember:  Do not eat food or drink liquids after midnight.  Take these medicines the morning of surgery with A SIP OF WATER  Zyrtec, diclofenac. DO NOT take any Glucophage the morning of surgery.   Do not wear jewelry, make-up or nail polish.  Do not wear lotions, powders, or perfumes.    Do not shave 48 hours prior to surgery.  Men may shave face and neck.  Do not bring valuables to the hospital.  [facility] is not responsible for any belongings or valuables.  Contacts, dentures or bridgework may not be worn into surgery.  Leave your suitcase in the car.  After surgery it may be brought to your room.  For patients admitted to the hospital, discharge time will be determined by your treatment team.  Patients discharged the day of surgery will not be allowed to drive home.   Name and phone number of your driver:   family Special instructions:  Follow the diet instructions given to you by Dr Roseanne Kaufman office.  Please read over the following fact sheets that you were given. Pain Booklet, Coughing and Deep Breathing, Surgical Site Infection Prevention, Anesthesia Post-op Instructions and Care and Recovery After Surgery      Esophageal Dilatation The esophagus is the long, narrow tube which carries food and liquid from the mouth to the stomach. Esophageal dilatation is the technique used to stretch a blocked or narrowed portion of the esophagus. This procedure is used when a part of the esophagus has become so narrow that it becomes difficult, painful or even impossible to swallow. This is generally an uncomplicated form of treatment. When this is not successful, chest surgery may be required. This is a much more extensive form of treatment with a longer  recovery time. CAUSES  Some of the more common causes of blockage or strictures of the esophagus are:  Narrowing from longstanding inflammation (soreness and redness) of the lower esophagus. This comes from the constant exposure of the lower esophagus to the acid which bubbles up from the stomach. Over time this causes scarring and narrowing of the lower esophagus.  Hiatal hernia in which a small part of the stomach bulges (herniates) up through the diaphragm. This can cause a gradual narrowing of the end of the esophagus.  Schatzki ring is a narrow ring of benign (non-cancerous) fibrous tissue which constricts the lower esophagus. The reason for this is not known.  Scleroderma is a connective tissue disorder that affects the esophagus and makes swallowing difficult.  Achalasia is an absence of nerves to the lower esophagus and to the esophageal sphincter. This is the circular muscle between the stomach and esophagus that relaxes to allow food into the stomach. After swallowing, it contracts to keep food in the stomach. This absence of nerves may be congenital (present since birth). This can cause irregular spasms of the lower esophageal muscle. This spasm does not open up to allow food and fluid through. The result is a persistent blockage with subsequent slow trickling of the esophageal contents into the stomach.  Strictures may develop from swallowing materials which damage the esophagus. Some examples are strong acids or alkalis such as lye.  Growths  such as benign (non-cancerous) and malignant (cancerous) tumors can block the esophagus.  Hereditary (present since birth) causes. DIAGNOSIS  Your caregiver often suspects this problem by taking a medical history. They will also do a physical exam. They can then prove their suspicions using X-rays and endoscopy. Endoscopy is an exam in which a tube like a small, flexible telescope is used to look at your esophagus.  TREATMENT There are  different stretching (dilating) techniques that can be used. Simple bougie dilatation may be done in the office. This usually takes only a couple minutes. A numbing (anesthetic) spray of the throat is used. Endoscopy, when done, is done in an endoscopy suite under mild sedation. When fluoroscopy is used, the procedure is performed in X-ray. Other techniques require a little longer time. Recovery is usually quick. There is no waiting time to begin eating and drinking to test success of the treatment. Following are some of the methods used. Narrowing of the esophagus is treated by making it bigger. Commonly this is a mechanical problem which can be treated with stretching. This can be done in different ways. Your caregiver will discuss these with you. Some of the means used are:  A series of graduated (increasing thickness) flexible dilators can be used. These are weighted tubes passed through the esophagus into the stomach. The tubes used become progressively larger until the desired stretched size is reached. Graduated dilators are a simple and quick way of opening the esophagus. No visualization is required.  Another method is the use of endoscopy to place a flexible wire across the stricture. The endoscope is removed and the wire left in place. A dilator with a hole through it from end to end is guided down the esophagus and across the stricture. One or more of these dilators are passed over the wire. At the end of the exam, the wire is removed. This type of treatment may be performed in the X-ray department under fluoroscopy. An advantage of this procedure is the examiner is visualizing the end opening in the esophagus.  Stretching of the esophagus may be done using balloons. Deflated balloons are placed through the endoscope and across the stricture. This type of balloon dilatation is often done at the time of endoscopy or fluoroscopy. Flexible endoscopy allows the examiner to directly view the  stricture. A balloon is inserted in the deflated form into the area of narrowing. It is then inflated with air to a certain pressure that is preset for a given circumference. When inflated, it becomes sausage shaped, stretched, and makes the stricture larger.  Achalasia requires a longer, larger balloon-type dilator. This is frequently done under X-ray control. In this situation, the spastic muscle fibers in the lower esophagus are stretched. All of the above procedures make the passage of food and water into the stomach easier. They also make it easier for stomach contents to reflux back into the esophagus. Special medications may be used following the procedure to help prevent further stricturing. Proton-pump inhibitor medications are good at decreasing the amount of acid in the stomach juice. When stomach juice refluxes into the esophagus, the juice is no longer as acidic and is less likely to burn or scar the esophagus. RISKS AND COMPLICATIONS Esophageal dilatation is usually performed effectively and without problems. Some complications that can occur are:  A small amount of bleeding almost always happens where the stretching takes place. If this is too excessive it may require more aggressive treatment.  An uncommon complication is perforation (  making a hole) of the esophagus. The esophagus is thin. It is easy to make a hole in it. If this happens, an operation may be necessary to repair this.  A small, undetected perforation could lead to an infection in the chest. This can be very serious. HOME CARE INSTRUCTIONS   If you received sedation for your procedure, do not drive, make important decisions, or perform any activities requiring your full coordination. Do not drink alcohol, take sedatives, or use any mind altering chemicals unless instructed by your caregiver.  You may use throat lozenges or warm salt water gargles if you have throat discomfort.  You can begin eating and drinking  normally on return home unless instructed otherwise. Do not purposely try to force large chunks of food down to test the benefits of your procedure.  Mild discomfort can be eased with sips of ice water.  Medications for discomfort may or may not be needed. SEEK IMMEDIATE MEDICAL CARE IF:   You begin vomiting up blood.  You develop black, tarry stools.  You develop chills or an unexplained temperature of over 101F (38.3C)  You develop chest or abdominal pain.  You develop shortness of breath, or feel light-headed or faint.  Your swallowing is becoming more painful, difficult, or you are unable to swallow. MAKE SURE YOU:   Understand these instructions.  Will watch your condition.  Will get help right away if you are not doing well or get worse. Document Released: 04/06/2005 Document Revised: 06/30/2013 Document Reviewed: 05/24/2005 [facility] Patient Information 2015 [place], [place]. This information is not intended to replace advice given to you by your health care provider. Make sure you discuss any questions you have with your health care provider. Esophagogastroduodenoscopy Esophagogastroduodenoscopy (EGD) is a procedure to examine the lining of the esophagus, stomach, and first part of the small intestine (duodenum). A long, flexible, lighted tube with a camera attached (endoscope) is inserted down the throat to view these organs. This procedure is done to detect problems or abnormalities, such as inflammation, bleeding, ulcers, or growths, in order to treat them. The procedure lasts about 5-20 minutes. It is usually an outpatient procedure, but it may need to be performed in emergency cases in the hospital. LET YOUR CAREGIVER KNOW ABOUT:   Allergies to food or medicine.  All medicines you are taking, including vitamins, herbs, eyedrops, and over-the-counter medicines and creams.  Use of steroids (by mouth or creams).  Previous problems you or members of your family have had  with the use of anesthetics.  Any blood disorders you have.  Previous surgeries you have had.  Other health problems you have.  Possibility of pregnancy, if this applies. RISKS AND COMPLICATIONS  Generally, EGD is a safe procedure. However, as with any procedure, complications can occur. Possible complications include:  Infection.  Bleeding.  Tearing (perforation) of the esophagus, stomach, or duodenum.  Difficulty breathing or not being able to breath.  Excessive sweating.  Spasms of the larynx.  Slowed heartbeat.  Low blood pressure. BEFORE THE PROCEDURE  Do not eat or drink anything for 6-8 hours before the procedure or as directed by your caregiver.  Ask your caregiver about changing or stopping your regular medicines.  If you wear dentures, be prepared to remove them before the procedure.  Arrange for someone to drive you home after the procedure. PROCEDURE   A vein will be accessed to give medicines and fluids. A medicine to relax you (sedative) and a pain reliever will be given  through that access into the vein.  A numbing medicine (local anesthetic) may be sprayed on your throat for comfort and to stop you from gagging or coughing.  A mouth guard may be placed in your mouth to protect your teeth and to keep you from biting on the endoscope.  You will be asked to lie on your left side.  The endoscope is inserted down your throat and into the esophagus, stomach, and duodenum.  Air is put through the endoscope to allow your caregiver to view the lining of your esophagus clearly.  The esophagus, stomach, and duodenum is then examined. During the exam, your caregiver may:  Remove tissue to be examined under a microscope (biopsy) for inflammation, infection, or other medical problems.  Remove growths.  Remove objects (foreign bodies) that are stuck.  Treat any bleeding with medicines or other devices that stop tissues from bleeding (hot cautery, clipping  devices).  Widen (dilate) or stretch narrowed areas of the esophagus and stomach.  The endoscope will then be withdrawn. AFTER THE PROCEDURE  You will be taken to a recovery area to be monitored. You will be able to go home once you are stable and alert.  Do not eat or drink anything until the local anesthetic and numbing medicines have worn off. You may choke.  It is normal to feel bloated, have pain with swallowing, or have a sore throat for a short time. This will wear off.  Your caregiver should be able to discuss his or her findings with you. It will take longer to discuss the test results if any biopsies were taken. Document Released: 06/16/2004 Document Revised: 06/30/2013 Document Reviewed: 01/17/2012 [facility] Patient Information 2015 [place], [place]. This information is not intended to replace advice given to you by your health care provider. Make sure you discuss any questions you have with your health care provider. PATIENT INSTRUCTIONS POST-ANESTHESIA  IMMEDIATELY FOLLOWING SURGERY:  Do not drive or operate machinery for the first twenty four hours after surgery.  Do not make any important decisions for twenty four hours after surgery or while taking narcotic pain medications or sedatives.  If you develop intractable nausea and vomiting or a severe headache please notify your doctor immediately.  FOLLOW-UP:  Please make an appointment with your surgeon as instructed. You do not need to follow up with anesthesia unless specifically instructed to do so.  WOUND CARE INSTRUCTIONS (if applicable):  Keep a dry clean dressing on the anesthesia/puncture wound site if there is drainage.  Once the wound has quit draining you may leave it open to air.  Generally you should leave the bandage intact for twenty four hours unless there is drainage.  If the epidural site drains for more than 36-48 hours please call the anesthesia department.  QUESTIONS?:  Please feel free to call your physician  or the hospital operator if you have any questions, and they will be happy to assist you.

## 2014-10-21 ENCOUNTER — Other Ambulatory Visit: Payer: Self-pay

## 2014-10-21 ENCOUNTER — Encounter (HOSPITAL_COMMUNITY)
Admission: RE | Admit: 2014-10-21 | Discharge: 2014-10-21 | Disposition: A | Discharge status: Home / Self Care | Payer: BLUE CROSS/BLUE SHIELD | Source: Ambulatory Visit | Attending: Internal Medicine | Admitting: Internal Medicine

## 2014-10-21 ENCOUNTER — Encounter (HOSPITAL_COMMUNITY): Payer: Self-pay

## 2014-10-21 DIAGNOSIS — Z01818 Encounter for other preprocedural examination: Secondary | ICD-10-CM | POA: Diagnosis present

## 2014-10-21 DIAGNOSIS — K921 Melena: Secondary | ICD-10-CM | POA: Insufficient documentation

## 2014-10-21 DIAGNOSIS — R1310 Dysphagia, unspecified: Secondary | ICD-10-CM | POA: Insufficient documentation

## 2014-10-21 HISTORY — DX: Low-tension glaucoma, unspecified eye, stage unspecified: H40.1290

## 2014-10-21 HISTORY — DX: Sleep apnea, unspecified: G47.30

## 2014-10-26 ENCOUNTER — Ambulatory Visit (HOSPITAL_COMMUNITY): Payer: BLUE CROSS/BLUE SHIELD | Admitting: Anesthesiology

## 2014-10-26 ENCOUNTER — Ambulatory Visit (HOSPITAL_COMMUNITY)
Admission: RE | Admit: 2014-10-26 | Discharge: 2014-10-26 | Disposition: A | Discharge status: Home / Self Care | Payer: BLUE CROSS/BLUE SHIELD | Source: Ambulatory Visit | Attending: Internal Medicine | Admitting: Internal Medicine

## 2014-10-26 ENCOUNTER — Ambulatory Visit: Admit: 2014-10-26 | Payer: BLUE CROSS/BLUE SHIELD | Admitting: Internal Medicine

## 2014-10-26 ENCOUNTER — Encounter (HOSPITAL_COMMUNITY)
Admission: RE | Disposition: A | Discharge status: Home / Self Care | Payer: Self-pay | Source: Ambulatory Visit | Attending: Internal Medicine

## 2014-10-26 ENCOUNTER — Encounter (HOSPITAL_COMMUNITY): Payer: Self-pay | Admitting: *Deleted

## 2014-10-26 DIAGNOSIS — K649 Unspecified hemorrhoids: Secondary | ICD-10-CM | POA: Diagnosis not present

## 2014-10-26 DIAGNOSIS — K2210 Ulcer of esophagus without bleeding: Secondary | ICD-10-CM | POA: Insufficient documentation

## 2014-10-26 DIAGNOSIS — K449 Diaphragmatic hernia without obstruction or gangrene: Secondary | ICD-10-CM | POA: Diagnosis not present

## 2014-10-26 DIAGNOSIS — E119 Type 2 diabetes mellitus without complications: Secondary | ICD-10-CM | POA: Insufficient documentation

## 2014-10-26 DIAGNOSIS — R1310 Dysphagia, unspecified: Secondary | ICD-10-CM | POA: Insufficient documentation

## 2014-10-26 DIAGNOSIS — K642 Third degree hemorrhoids: Secondary | ICD-10-CM | POA: Insufficient documentation

## 2014-10-26 DIAGNOSIS — K2100 Gastro-esophageal reflux disease with esophagitis, without bleeding: Secondary | ICD-10-CM | POA: Insufficient documentation

## 2014-10-26 DIAGNOSIS — Z8719 Personal history of other diseases of the digestive system: Secondary | ICD-10-CM | POA: Diagnosis not present

## 2014-10-26 DIAGNOSIS — K625 Hemorrhage of anus and rectum: Secondary | ICD-10-CM | POA: Diagnosis not present

## 2014-10-26 DIAGNOSIS — D509 Iron deficiency anemia, unspecified: Secondary | ICD-10-CM | POA: Diagnosis not present

## 2014-10-26 DIAGNOSIS — Z79899 Other long term (current) drug therapy: Secondary | ICD-10-CM | POA: Diagnosis not present

## 2014-10-26 DIAGNOSIS — Z86010 Personal history of colonic polyps: Secondary | ICD-10-CM | POA: Insufficient documentation

## 2014-10-26 DIAGNOSIS — R1314 Dysphagia, pharyngoesophageal phase: Secondary | ICD-10-CM | POA: Insufficient documentation

## 2014-10-26 HISTORY — PX: GIVENS CAPSULE STUDY: SHX5432

## 2014-10-26 HISTORY — PX: RECTAL EXAM UNDER ANESTHESIA: SHX6399

## 2014-10-26 HISTORY — PX: ESOPHAGEAL DILATION: SHX303

## 2014-10-26 HISTORY — PX: ESOPHAGOGASTRODUODENOSCOPY (EGD) WITH PROPOFOL: SHX5813

## 2014-10-26 LAB — GLUCOSE, CAPILLARY: GLUCOSE-CAPILLARY: 112 mg/dL — AB (ref 65–99)

## 2014-10-26 SURGERY — ESOPHAGOGASTRODUODENOSCOPY (EGD) WITH PROPOFOL
Anesthesia: Monitor Anesthesia Care

## 2014-10-26 SURGERY — EGD (ESOPHAGOGASTRODUODENOSCOPY)
Anesthesia: Moderate Sedation

## 2014-10-26 MED ORDER — MIDAZOLAM HCL 5 MG/5ML IJ SOLN
INTRAMUSCULAR | Status: DC | PRN
Start: 2014-10-26 — End: 2014-10-26
  Administered 2014-10-26: 2 mg via INTRAVENOUS

## 2014-10-26 MED ORDER — PROPOFOL INFUSION 10 MG/ML OPTIME
INTRAVENOUS | Status: DC | PRN
  Administered 2014-10-26: 125 ug/kg/min via INTRAVENOUS

## 2014-10-26 MED ORDER — ONDANSETRON HCL 4 MG/2ML IJ SOLN
INTRAMUSCULAR | Status: AC
  Filled 2014-10-26: qty 2

## 2014-10-26 MED ORDER — MIDAZOLAM HCL 2 MG/2ML IJ SOLN
INTRAMUSCULAR | Status: AC
  Filled 2014-10-26: qty 4

## 2014-10-26 MED ORDER — PROPOFOL 10 MG/ML IV BOLUS
INTRAVENOUS | Status: AC
  Filled 2014-10-26: qty 20

## 2014-10-26 MED ORDER — MIDAZOLAM HCL 2 MG/2ML IJ SOLN
INTRAMUSCULAR | Status: AC
  Filled 2014-10-26: qty 2

## 2014-10-26 MED ORDER — STERILE WATER FOR IRRIGATION IR SOLN
Status: DC | PRN
  Administered 2014-10-26: 1000 mL

## 2014-10-26 MED ORDER — LIDOCAINE VISCOUS 2 % MT SOLN
OROMUCOSAL | Status: AC
  Filled 2014-10-26: qty 15

## 2014-10-26 MED ORDER — FENTANYL CITRATE (PF) 100 MCG/2ML IJ SOLN: 25.0000 ug | INTRAMUSCULAR | Status: DC | PRN

## 2014-10-26 MED ORDER — LACTATED RINGERS IV SOLN
INTRAVENOUS | Status: DC
  Administered 2014-10-26: 08:00:00 via INTRAVENOUS

## 2014-10-26 MED ORDER — LIDOCAINE HCL (CARDIAC) 10 MG/ML IV SOLN
INTRAVENOUS | Status: DC | PRN
  Administered 2014-10-26: 50 mg via INTRAVENOUS

## 2014-10-26 MED ORDER — LIDOCAINE VISCOUS 2 % MT SOLN
15.0000 mL | Freq: Once | OROMUCOSAL | Status: AC | PRN
  Administered 2014-10-26 (×2): 15 mL via OROMUCOSAL

## 2014-10-26 MED ORDER — ONDANSETRON HCL 4 MG/2ML IJ SOLN
4.0000 mg | Freq: Once | INTRAMUSCULAR | Status: AC
  Administered 2014-10-26: 4 mg via INTRAVENOUS

## 2014-10-26 MED ORDER — MIDAZOLAM HCL 2 MG/2ML IJ SOLN
1.0000 mg | INTRAMUSCULAR | Status: DC | PRN
  Administered 2014-10-26: 2 mg via INTRAVENOUS

## 2014-10-26 MED ORDER — ONDANSETRON HCL 4 MG/2ML IJ SOLN: 4.0000 mg | Freq: Once | INTRAMUSCULAR | Status: DC | PRN

## 2014-10-26 MED ORDER — FENTANYL CITRATE (PF) 100 MCG/2ML IJ SOLN
INTRAMUSCULAR | Status: AC
  Filled 2014-10-26: qty 2

## 2014-10-26 MED ORDER — FENTANYL CITRATE (PF) 100 MCG/2ML IJ SOLN
25.0000 ug | INTRAMUSCULAR | Status: AC
  Administered 2014-10-26 (×2): 25 ug via INTRAVENOUS

## 2014-10-26 SURGICAL SUPPLY — 31 items
BALLN CRE LF 10-12 240X5.5 (BALLOONS)
BALLN DILATOR CRE 12-15 240 (BALLOONS)
BALLN DILATOR CRE 15-18 240 (BALLOONS) IMPLANT
BALLN DILATOR CRE 18-20 240 (BALLOONS) IMPLANT
BALLN DILATOR CRE WIREGUIDE (BALLOONS)
BALLOON CRE LF 10-12 240X5.5 (BALLOONS) IMPLANT
BALLOON DILATOR CRE 12-15 240 (BALLOONS) IMPLANT
BALLOON DILATOR CRE WIREGUIDE (BALLOONS) IMPLANT
BLOCK BITE 60FR ADLT L/F BLUE (MISCELLANEOUS) ×1 IMPLANT
CAPSULE 12 HR EXT SB3 GIVENS (MISCELLANEOUS) ×2 IMPLANT
DEVICE ADV CAPSULE ENDO DELIV (MISCELLANEOUS) ×2 IMPLANT
DEVICE CLIP HEMOSTAT 235CM (CLIP) IMPLANT
ELECT REM PT RETURN 9FT ADLT (ELECTROSURGICAL)
ELECTRODE REM PT RTRN 9FT ADLT (ELECTROSURGICAL) IMPLANT
FLOOR PAD 36X40 (MISCELLANEOUS)
FORCEPS BIOP RAD 4 LRG CAP 4 (CUTTING FORCEPS) IMPLANT
FORMALIN 10 PREFIL 20ML (MISCELLANEOUS) IMPLANT
KIT ENDO PROCEDURE PEN (KITS) ×2 IMPLANT
MANIFOLD NEPTUNE II (INSTRUMENTS) ×1 IMPLANT
NDL SCLEROTHERAPY 25GX240 (NEEDLE) IMPLANT
NEEDLE SCLEROTHERAPY 25GX240 (NEEDLE) IMPLANT
PAD FLOOR 36X40 (MISCELLANEOUS) IMPLANT
PROBE APC STR FIRE (PROBE) IMPLANT
PROBE INJECTION GOLD (MISCELLANEOUS)
PROBE INJECTION GOLD 7FR (MISCELLANEOUS) IMPLANT
SNARE ROTATE MED OVAL 20MM (MISCELLANEOUS) IMPLANT
SNARE SHORT THROW 13M SML OVAL (MISCELLANEOUS) ×1 IMPLANT
SYR INFLATE BILIARY GAUGE (MISCELLANEOUS) IMPLANT
SYR INFLATION 60ML (SYRINGE) ×2 IMPLANT
TUBING IRRIGATION ENDOGATOR (MISCELLANEOUS) ×1 IMPLANT
WATER STERILE IRR 1000ML POUR (IV SOLUTION) ×1 IMPLANT

## 2014-10-26 NOTE — H&P (View-Only) (Signed)
Primary Care Physician:  FAGAN,ROY, MD  Primary Gastroenterologist:  Michael Rourk, MD   Chief Complaint  Patient presents with  . Anemia    HPI:  Debra Harris is a 64 y.o. female here for further evaluation of iron deficiency anemia. Noted to have microcytic anemia back in 2014. Iron was 39 at the time. Ferritin not performed. Hemoglobin 9.2, MCV 76 in 03/04/2013. Patient reportedly took oral iron for over a year without any significant benefit. Was seen by hematology in April. Ferritin 12. Iron indices consistent with IDA. Hemoglobin 10.8 at the time. She subsequently received at least 3 iron infusions per her report. Hemoccults status unknown. She sees frequent rectal bleeding, she feels like is a moderate amount, occasionally with blood clots. Anucort seemed to improve the bleeding. Stools are very dark. She's not sure whether it's melena or iron. She has chronic left lower quadrant pain sometimes stabbing in quality. Often improved with a bowel movement. Seen here locally and by Dr. Brodie for this symptom. Thought to be IBS related. Symptoms present for years. Last CT imaging in October 2015 with fatty liver but no other abnormalities. Generally has a bowel movement most days. Takes Colace to keep her stool soft. Takes omeprazole as needed for heartburn. Does not need daily. Denies vomiting, unintentional weight loss. Recently she has noticed some dysphagia to solid foods. Patient reminds me that she has a history of growing benign tumors. She questions if this may be the etiology of her left lower quadrant pain. Nothing seen on CT in 2015.  EGD with colonoscopy November 2010 by Dr. Rourk (Versed 4 mg/Demerol 75 mg). Distal esophageal erosions, patulous EG junction, Maloney dilator empirically, small hiatal hernia. Pancolonic diverticula, external hemorrhoids, otherwise colon appeared normal.  Colonoscopy September 2014 by Dr. Dora Brodie. Mild diverticulosis in the sigmoid colon, random  colon biopsies negative.      Current Outpatient Prescriptions  Medication Sig Dispense Refill  . amitriptyline (ELAVIL) 25 MG tablet Take 50 mg by mouth at bedtime.     . cetirizine (ZYRTEC) 10 MG tablet Take 10 mg by mouth daily.     . DICLOFENAC SODIUM PO Take 1 tablet by mouth 2 (two) times daily.    . docusate sodium (COLACE) 100 MG capsule Take 100 mg by mouth daily.     . ferrous sulfate 325 (65 FE) MG tablet Take 325 mg by mouth daily. 1 time in am and 1 time in evening    . hydrocortisone (ANUSOL-HC) 25 MG suppository Place 25 mg rectally 2 (two) times daily.    . metFORMIN (GLUCOPHAGE) 500 MG tablet Take 1,000 mg by mouth daily.    . Probiotic Product (TRUNATURE DIGESTIVE PROBIOTIC PO) Take 1 capsule by mouth daily.    . UNKNOWN TO PATIENT CPAP machine    . UNKNOWN TO PATIENT Thera tears gel- apply to eyes at bedtime    . Wheat Dextrin (BENEFIBER) POWD 2 heaping teaspoons daily 1 g 0   No current facility-administered medications for this visit.    Allergies as of 10/12/2014  . (No Known Allergies)    Past Medical History  Diagnosis Date  . Diabetes mellitus   . Allergic rhinitis   . Neuropathy, leg   . Ovarian cyst 11/20/00  . External hemorrhoids   . Adenomatous colon polyp   . GERD (gastroesophageal reflux disease)   . Sliding hiatal hernia   . Arthritis   . Glaucoma   . Diverticulosis   . IDA (iron deficiency   anemia)     Past Surgical History  Procedure Laterality Date  . Partial hysterectomy  age 40s  . Oophorectomy  age 50s  . Cholecystectomy    . Carpal tunnel release Bilateral   . Thumb surger Bilateral   . Knee surgery      right  . Cataract extraction      bilateral  . Colonoscopy  2005    Small external hemorrhoids, single sigmoid colon diverticulum, small polyp at the hepatic flexure focally adenomatous.  . Colonoscopy  2010    Dr. Rourk, Versed 4/Demerol 75. Pancolonic diverticula, external hemorrhoids. Colonoscopy every 5 years for  history of adenomatous polyps  . Colonoscopy  2014    Dr. Brodie: Propofol. mild diverticulosis in the sigmoid colon, random colon biopsies benign.  . Esophagogastroduodenoscopy  2010    Dr. Rourk, distal esophageal erosions, Maloney dilator passed. Comment small hiatal hernia, patulous EG junction.    Family History  Problem Relation Age of Onset  . Ulcers Mother   . Colon cancer Neg Hx   . Glaucoma Mother   . Glaucoma Maternal Grandmother     Social History   Social History  . Marital Status: Married    Spouse Name: J. Wayne Keelings  . Number of Children: Y  . Years of Education: N/A   Occupational History  . AUDIOLOGIST    Social History Main Topics  . Smoking status: Never Smoker   . Smokeless tobacco: Never Used     Comment: Pt reports tried once in 7th grade//ms  . Alcohol Use: 0.6 oz/week    1 Glasses of wine per week     Comment: rare  . Drug Use: No  . Sexual Activity: Not on file   Other Topics Concern  . Not on file   Social History Narrative   3 children         ROS:  General: Negative for anorexia, weight loss, fever, chills, fatigue, weakness. Eyes: Negative for vision changes.  ENT: Negative for hoarseness,   nasal congestion. CV: Negative for chest pain, angina, palpitations, dyspnea on exertion, peripheral edema.  Respiratory: Negative for dyspnea at rest, dyspnea on exertion, cough, sputum, wheezing.  GI: See history of present illness. GU:  Negative for dysuria, hematuria, urinary incontinence, urinary frequency, nocturnal urination.  MS: Negative for joint pain, low back pain.  Derm: Negative for rash or itching.  Neuro: Negative for weakness, abnormal sensation, seizure, frequent headaches, memory loss, confusion.  Psych: Negative for anxiety, depression, suicidal ideation, hallucinations.  Endo: Negative for unusual weight change.  Heme: Negative for bruising or bleeding. Allergy: Negative for rash or hives.    Physical  Examination:  BP 140/81 mmHg  Pulse 72  Temp(Src) 97.4 F (36.3 C)  Ht 5' 3" (1.6 m)  Wt 213 lb 3.2 oz (96.707 kg)  BMI 37.78 kg/m2   General: Well-nourished, well-developed in no acute distress.  Head: Normocephalic, atraumatic.   Eyes: Conjunctiva pink, no icterus. Mouth: Oropharyngeal mucosa moist and pink , no lesions erythema or exudate. Neck: Supple without thyromegaly, masses, or lymphadenopathy.  Lungs: Clear to auscultation bilaterally.  Heart: Regular rate and rhythm, no murmurs rubs or gallops.  Abdomen: Bowel sounds are normal, LLQ mild tenderness, nondistended, no hepatosplenomegaly or masses, no abdominal bruits or    hernia , no rebound or guarding.   Rectal: not performed Extremities: No lower extremity edema. No clubbing or deformities.  Neuro: Alert and oriented x 4 , grossly normal neurologically.  Skin: Warm   and dry, no rash or jaundice.   Psych: Alert and cooperative, normal mood and affect.  Labs: Lab Results  Component Value Date   WBC 6.0 10/09/2014   HGB 11.4* 10/09/2014   HCT 36.7 10/09/2014   MCV 88.9 10/09/2014   PLT 290 10/09/2014   Lab Results  Component Value Date   CREATININE 0.84 10/09/2014   BUN 14 10/09/2014   NA 140 10/09/2014   K 4.0 10/09/2014   CL 105 10/09/2014   CO2 27 10/09/2014   Lab Results  Component Value Date   ALT 38 10/09/2014   AST 35 10/09/2014   ALKPHOS 70 10/09/2014   BILITOT 0.6 10/09/2014   Lab Results  Component Value Date   IRON 45 10/09/2014   TIBC 461* 10/09/2014   FERRITIN 25 10/09/2014   FERRITIN 24 10/09/2014     Imaging Studies: No results found.    

## 2014-10-26 NOTE — Discharge Instructions (Signed)
EGD Discharge instructions Please read the instructions outlined below and refer to this sheet in the next few weeks. These discharge instructions provide you with general information on caring for yourself after you leave the hospital. Your doctor may also give you specific instructions. While your treatment has been planned according to the most current medical practices available, unavoidable complications occasionally occur. If you have any problems or questions after discharge, please call your doctor. ACTIVITY  You may resume your regular activity but move at a slower pace for the next 24 hours.   Take frequent rest periods for the next 24 hours.   Walking will help expel (get rid of) the air and reduce the bloated feeling in your abdomen.   No driving for 24 hours (because of the anesthesia (medicine) used during the test).   You may shower.   Do not sign any important legal documents or operate any machinery for 24 hours (because of the anesthesia used during the test).  NUTRITION  Drink plenty of fluids.   You may resume your normal diet.   Begin with a light meal and progress to your normal diet.   Avoid alcoholic beverages for 24 hours or as instructed by your caregiver.  MEDICATIONS  You may resume your normal medications unless your caregiver tells you otherwise.  WHAT YOU CAN EXPECT TODAY  You may experience abdominal discomfort such as a feeling of fullness or gas pains.  FOLLOW-UP  Your doctor will discuss the results of your test with you.  SEEK IMMEDIATE MEDICAL ATTENTION IF ANY OF THE FOLLOWING OCCUR:  Excessive nausea (feeling sick to your stomach) and/or vomiting.   Severe abdominal pain and distention (swelling).   Trouble swallowing.   Temperature over 101 F (37.8 C).   Rectal bleeding or vomiting of blood.     GERD information provided  Hemorrhoid information provided  Begin Protonix 40 mg daily; stop omeprazole  Avoid  straining.  Benefiber 2 teaspoons twice daily  Limit toilet time to 2-5 minutes  Call with any interim problems  Schedule followup appointment for possible hemorrhoid banding in 4 weeks

## 2014-10-26 NOTE — Transfer of Care (Signed)
Immediate Anesthesia Transfer of Care Note  Patient: Debra Harris  Procedure(s) Performed: Procedure(s): ESOPHAGOGASTRODUODENOSCOPY (EGD) WITH PROPOFOL (N/A) ESOPHAGEAL DILATION 42 FRENCH (N/A) GIVENS CAPSULE STUDY INSERTED AT 0944 (N/A) RECTAL EXAM UNDER ANESTHESIA (N/A)  Patient Location: PACU  Anesthesia Type:MAC  Level of Consciousness: awake, oriented and patient cooperative  Airway & Oxygen Therapy: Patient Spontanous Breathing and Patient connected to face mask oxygen  Post-op Assessment: Report given to RN and Post -op Vital signs reviewed and stable  Post vital signs: Reviewed and stable  Last Vitals:  Filed Vitals:   10/26/14 0920  BP: 129/69  Pulse:   Temp:   Resp: 24    Complications: No apparent anesthesia complications

## 2014-10-26 NOTE — Anesthesia Postprocedure Evaluation (Signed)
  Anesthesia Post-op Note  Patient: Debra Harris  Procedure(s) Performed: Procedure(s): ESOPHAGOGASTRODUODENOSCOPY (EGD) WITH PROPOFOL (N/A) ESOPHAGEAL DILATION 71 FRENCH (N/A) GIVENS CAPSULE STUDY INSERTED AT [id number] (N/A) RECTAL EXAM UNDER ANESTHESIA (N/A)  Patient Location: PACU  Anesthesia Type:MAC  Level of Consciousness: awake, alert , oriented and patient cooperative  Airway and Oxygen Therapy: Patient Spontanous Breathing and Patient connected to nasal cannula oxygen  Post-op Pain: none  Post-op Assessment: Post-op Vital signs reviewed, Patient's Cardiovascular Status Stable, Respiratory Function Stable, Patent Airway and No signs of Nausea or vomiting              Post-op Vital Signs: Reviewed and stable  Last Vitals:  Filed Vitals:   10/26/14 0920  BP: 129/69  Pulse:   Temp:   Resp: 24    Complications: No apparent anesthesia complications

## 2014-10-26 NOTE — Anesthesia Procedure Notes (Signed)
Procedure Name: MAC Date/Time: 10/26/2014 9:23 AM Performed by: Andree Elk, AMY A Pre-anesthesia Checklist: Patient identified, Timeout performed, Emergency Drugs available, Suction available and Patient being monitored Oxygen Delivery Method: Simple face mask

## 2014-10-26 NOTE — Anesthesia Preprocedure Evaluation (Addendum)
Anesthesia Evaluation  Patient identified by MRN, date of birth, ID band Patient awake    Reviewed: Allergy & Precautions, NPO status , Patient's Chart, lab work & pertinent test results  Airway Mallampati: II  TM Distance: >3 FB     Dental  (+) Teeth Intact   Pulmonary sleep apnea and Continuous Positive Airway Pressure Ventilation ,  breath sounds clear to auscultation        Cardiovascular negative cardio ROS  Rhythm:Regular Rate:Normal     Neuro/Psych  Neuromuscular disease    GI/Hepatic hiatal hernia, GERD-  Controlled and Medicated,  Endo/Other  diabetes, Well Controlled, Type 2  Renal/GU      Musculoskeletal  (+) Arthritis -,   Abdominal   Peds  Hematology  (+) anemia ,   Anesthesia Other Findings   Reproductive/Obstetrics                            Anesthesia Physical Anesthesia Plan  ASA: II  Anesthesia Plan: MAC   Post-op Pain Management:    Induction: Intravenous  Airway Management Planned: Simple Face Mask  Additional Equipment:   Intra-op Plan:   Post-operative Plan:   Informed Consent: I have reviewed the patients History and Physical, chart, labs and discussed the procedure including the risks, benefits and alternatives for the proposed anesthesia with the patient or authorized representative who has indicated his/her understanding and acceptance.     Plan Discussed with:   Anesthesia Plan Comments:         Anesthesia Quick Evaluation

## 2014-10-26 NOTE — Op Note (Signed)
[facility] [address] [place], [address]   2ENDOSCOPY PROCEDURE REPORT  PATIENT: Debra, Harris  MR#: [id number] BIRTHDATE: 1951-01-19 , 81  yrs. old GENDER: female ENDOSCOPIST: R.  Garfield Cornea, MD FACP FACG REFERRED BY:  Asencion Noble, M.D. PROCEDURE DATE:  11/10/14 PROCEDURE:  EGD, diagnostic and Maloney dilation of esophagus  and small bowel video capsule placement INDICATIONS:  Esophageal dysphagia; possible melena; iron deficiency anemia. MEDICATIONS: Deep sedation per Dr.  [facility] ASA CLASS:      Class II  CONSENT: The risks, benefits, limitations, alternatives and imponderables have been discussed.  The potential for biopsy, esophogeal dilation, etc. have also been reviewed.  Questions have been answered.  All parties agreeable.  Please see the history and physical in the medical record for more information.  DESCRIPTION OF PROCEDURE: After the risks benefits and alternatives of the procedure were thoroughly explained, informed consent was obtained.  The    endoscope was introduced through the mouth and advanced to the second portion of the duodenum , limited by Without limitations. The instrument was slowly withdrawn as the mucosa was fully examined. Estimated blood loss is zero unless otherwise noted in this procedure report.    Patent appearing tubular esophagus throughout its course.  Mucosal break/esophageal erosions within 5 mm the GE junction.  No Barrett's esophagus.  No nodularity seen.  Stomach empty.  2 cm hiatal hernia.  Normal-appearing gastric mucosa.  Patent pylorus. Normal-appearing first and second portion of the duodenum.  The scope was withdrawn and a 6 [place] Maloney dilator was passed a full insertion easily.  Subsequently, the video capsule deployment device was loaded through the scopecapsule was attached.  The scope was reintroduced into the esophagus, stomach; I easily passed the scope into the  duodenum and the capsule was launched into the second portion of the duodenum without difficulty.  There was no apparent complication and no blood seen after passage of the dilator.  Retroflexed views revealed a hiatal hernia.     The scope was then withdrawn from the patient and the procedure completed.  COMPLICATIONS: There were no immediate complications.  ENDOSCOPIC IMPRESSION: Distal esophageal erosions consistent with erosive reflux esophagitis (mild). 2 cm hiatal hernia; otherwise normal EGD. Status post appointment of small bowel video capsule as described. Please note I examined her anus per planned following the procedure. She had circumferential grade 3 hemorrhoids easily reducible.  It is noted by both patient and husband that she dwells in the bathroom daily spending 30-45 minutes up to 5 times daily trying to have a bowel movement.  She is not taking a fiber supplement. She takes Colace daily. Since being on iron, her bowel function has slowed. Husband states she may lose a pint of blood every several weeks. Discussed colonoscopy findings none elsewhere at length.  RECOMMENDATIONS: Begin Protonix 40 mg daily. Review capsule data as it becomes Available. Add Benefiber 2 teaspoons twice daily to regimen. Limit total to 2- 5 minutes; avoid straining.   Office visit with [name] in 4 weeks. May be of reasonably good hemorrhoid banding candidate.  REPEAT EXAM:  eSigned:  R. Garfield Cornea, MD Rosalita Chessman [facility] 11-10-14 10:20 AM    CC:  CPT CODES: ICD CODES:  The ICD and CPT codes recommended by this software are interpretations from the data that the clinical staff has captured with the software.  The verification of the translation of this report to the ICD and CPT codes and modifiers is the sole  responsibility of the health care institution and practicing physician where this report was generated.  [place]. will not be held responsible for the validity  of the ICD and CPT codes included on this report.  AMA assumes no liability for data contained or not contained herein. CPT is a Designer, television/film set of the [employer].  PATIENT NAME:  Debra, Harris MR#: [id number]

## 2014-10-26 NOTE — Interval H&P Note (Signed)
History and Physical Interval Note:  10/26/2014 9:09 AM  Debra Harris  has presented today for surgery, with the diagnosis of IDA/DYSPHAGIA/MELENA  The various methods of treatment have been discussed with the patient and family. After consideration of risks, benefits and other options for treatment, the patient has consented to  Procedure(s) with comments: ESOPHAGOGASTRODUODENOSCOPY (EGD) WITH PROPOFOL (N/A) - 0900 ESOPHAGEAL DILATION (N/A) GIVENS CAPSULE STUDY (N/A) as a surgical intervention .  The patient's history has been reviewed, patient examined, no change in status, stable for surgery.  I have reviewed the patient's chart and labs.  Questions were answered to the patient's satisfaction.     Manus Rudd  Patient with vague esophageal dysphagia. Desires dilation. If diagnostic EGD unrevealing as to the cause of IDA/bleeding, will attempt capsule placement via scope. Patient and husband understand may not be feasible to pass capsule with scope and she may have to come back. Of note, patient spends 30-45 minutes in the bathroom trying to have a bowel movement 4-5 times daily. Reads magazines in the bathroom on the commode for prolonged periods of time. Takes Colace but no stool softener. Physical straining outside the bathroom and passed a large amount of bright red blood recently.The risks, benefits, limitations, alternatives and imponderables have been reviewed with the patient. Potential for esophageal dilation, biopsy, etc. have also been reviewed.  Questions have been answered. All parties agreeable.

## 2014-10-27 ENCOUNTER — Encounter (HOSPITAL_COMMUNITY): Payer: Self-pay | Admitting: Internal Medicine

## 2014-10-29 ENCOUNTER — Ambulatory Visit: Payer: BC Managed Care – PPO | Admitting: Pulmonary Disease

## 2014-10-30 ENCOUNTER — Telehealth: Payer: Self-pay | Admitting: Gastroenterology

## 2014-10-30 DIAGNOSIS — D509 Iron deficiency anemia, unspecified: Secondary | ICD-10-CM | POA: Insufficient documentation

## 2014-10-30 DIAGNOSIS — Z8719 Personal history of other diseases of the digestive system: Secondary | ICD-10-CM | POA: Insufficient documentation

## 2014-10-30 DIAGNOSIS — K625 Hemorrhage of anus and rectum: Secondary | ICD-10-CM | POA: Insufficient documentation

## 2014-10-30 NOTE — Telephone Encounter (Signed)
1. Please let patient know her small bowel capsule endoscopy looked good. She had couple of erosions but nothing significant.  2. She can continue her Diclofenac if needed. No need to stop given recent EGD/capsule findings.  3. She should continue her Protonix that Dr. Gala Romney recently started (for erosive reflux esophagitis).  4. Need to check TTG IgA and IgA level to rule out celiac as cause of her IDA BUT likely due to hemorrhoid bleeding.  5. Return to see Dr. Gala Romney in 4 weeks, consider hemorrhoid banding at that time if she is agreeable. If not agreeable, then she should still see him for follow up of other GI issues.

## 2014-10-30 NOTE — Op Note (Signed)
Small Bowel Debra Harris Capsule Study Procedure date:  10/26/2014  Referring Provider:  Dr. Ancil Linsey PCP:  Dr. Asencion Noble, MD  Indication for procedure:  Iron deficiency anemia, possible melena, rectal bleeding. Patient with a history of iron deficiency anemia dating back to 2014. Requiring iron infusions this year. History of moderate rectal bleeding associate with blood clots in the setting of prolonged bathroom time and attempts to have a bowel movement. Husband reports at least a pint of blood passed rectally every few weeks. EGD 10/26/2014 showed distal esophageal erosions. Esophagus empirically dilated for history of dysphagia. Capsule was deployed at time of procedure. Rectal exam done under sedation per patient request showed circumferential grade 3 hemorrhoids easily reducible. Colonoscopy September 2014 by Dr. Delfin Edis showed mild diverticulosis in the sigmoid colon, random colon biopsies unremarkable.  Patient data:  Wt: 213 pounds  Findings:  Aside from a couple of mucosal breaks/erosions at the 1 minute 16 seconds, 56 minutes 9 seconds, 3 hours 54 minutes 19 seconds, essentially unremarkable small bowel study.  First Gastric image:  N/A First Duodenal image: 2 minutes 38 seconds First Ileo-Cecal Valve image: 4 hours 57 minutes 37 seconds First Cecal image: 4 hours 58 minutes 5 seconds Gastric Passage time: N/A  Small Bowel Passage time:  4 hours 55 minutes   Summary & Recommendations: Occasional small bowel mucosal break/erosion otherwise unremarkable study. Patient is on diclofenac daily. Based on recent EGD and current small bowel capsule endoscopy, patient can continue NSAID therapy. She was recently started on protonix given mild erosive reflux esophagitis which she should continue.  Patient's iron deficiency anemia is likely related to significant hemorrhoidal bleeding as described above. We will check celiac serologies for completeness. She will return to the office in a  few weeks to see Dr. Gala Romney. Consider hemorrhoid banding if patient agrees.  Laureen Ochs. Bernarda Caffey [facility] [phone] 9/2/201612:40 PM  Attending note:  Pertinent images reviewed. Agree with above assessment and recommendations as outlined

## 2014-11-03 NOTE — Telephone Encounter (Signed)
Pt is aware of results and recommendations. She said she was already tested for celiac dz and it was negative. Per epic she had this done in 09/2012. Erline Levine made the pt an appt to come in but she only wants to talk to RMR about a banding, she said the medicine has helped a lot and she is not having as much bleeding now.  She was also concerned about the protonix, she said the pharmacist told her that it blocks iron absorption and she wanted to talk to LSL about it.   She also said that she had another concern but she didn't know how to explain it to me. She said if she could explain it to LSL that she would know what she is talking about. She is aware that LSL is out of the office. She wanted to know if LSL could call her when she got a chance to discuss it.

## 2014-11-04 NOTE — Telephone Encounter (Signed)
Spoke with patient. Addressed concerns.   She has ERE, recommend PPI for now, at least 3 months. She states she is feeling better with regards to GERD. She is concerned about PPI affecting iron absorption. Chronically it can be a problem, will have to monitor.   She recalls having rectocele repair and wanted to make sure we were aware in case it had anything to do with her issues.   Patient will keep appointment with Dr. Gala Romney in near future. Call with any questions or concerns in the interim.  FYI Dr. Gala Romney.

## 2014-11-08 ENCOUNTER — Encounter (HOSPITAL_COMMUNITY): Payer: Self-pay | Admitting: Hematology & Oncology

## 2014-11-19 ENCOUNTER — Other Ambulatory Visit (HOSPITAL_COMMUNITY): Payer: Self-pay | Admitting: Hematology & Oncology

## 2014-11-19 ENCOUNTER — Encounter (HOSPITAL_COMMUNITY): Payer: BLUE CROSS/BLUE SHIELD | Attending: Hematology & Oncology

## 2014-11-19 DIAGNOSIS — D509 Iron deficiency anemia, unspecified: Secondary | ICD-10-CM | POA: Diagnosis present

## 2014-11-19 DIAGNOSIS — K921 Melena: Secondary | ICD-10-CM | POA: Diagnosis not present

## 2014-11-19 LAB — CBC WITH DIFFERENTIAL/PLATELET
BASOS PCT: 1 %
Basophils Absolute: 0.1 10*3/uL (ref 0.0–0.1)
Eosinophils Absolute: 0.3 10*3/uL (ref 0.0–0.7)
Eosinophils Relative: 5 %
HEMATOCRIT: 35.4 % — AB (ref 36.0–46.0)
HEMOGLOBIN: 11.4 g/dL — AB (ref 12.0–15.0)
LYMPHS ABS: 1.9 10*3/uL (ref 0.7–4.0)
Lymphocytes Relative: 33 %
MCH: 28.1 pg (ref 26.0–34.0)
MCHC: 32.2 g/dL (ref 30.0–36.0)
MCV: 87.2 fL (ref 78.0–100.0)
MONOS PCT: 8 %
Monocytes Absolute: 0.4 10*3/uL (ref 0.1–1.0)
NEUTROS ABS: 3.1 10*3/uL (ref 1.7–7.7)
NEUTROS PCT: 53 %
Platelets: 273 10*3/uL (ref 150–400)
RBC: 4.06 MIL/uL (ref 3.87–5.11)
RDW: 13.7 % (ref 11.5–15.5)
WBC: 5.8 10*3/uL (ref 4.0–10.5)

## 2014-11-19 LAB — FERRITIN: Ferritin: 46 ng/mL (ref 11–307)

## 2014-11-19 NOTE — Progress Notes (Signed)
Labs drawn

## 2014-11-20 ENCOUNTER — Encounter (HOSPITAL_COMMUNITY): Payer: BLUE CROSS/BLUE SHIELD

## 2014-11-26 ENCOUNTER — Other Ambulatory Visit: Payer: Self-pay

## 2014-11-26 NOTE — Telephone Encounter (Signed)
PT IS GOING OUT OF TOWN AND WOULD LIKE A REFILL

## 2014-11-27 MED ORDER — HYDROCORTISONE ACETATE 25 MG RE SUPP: 25.0000 mg | Freq: Two times a day (BID) | RECTAL | Status: DC

## 2014-12-11 ENCOUNTER — Ambulatory Visit (INDEPENDENT_AMBULATORY_CARE_PROVIDER_SITE_OTHER): Payer: BLUE CROSS/BLUE SHIELD | Admitting: Internal Medicine

## 2014-12-11 ENCOUNTER — Encounter: Payer: Self-pay | Admitting: Internal Medicine

## 2014-12-11 VITALS — BP 121/71 | HR 68 | Temp 97.4°F | Ht 63.0 in | Wt 210.0 lb

## 2014-12-11 DIAGNOSIS — R1310 Dysphagia, unspecified: Secondary | ICD-10-CM | POA: Diagnosis not present

## 2014-12-11 DIAGNOSIS — K2100 Gastro-esophageal reflux disease with esophagitis, without bleeding: Secondary | ICD-10-CM

## 2014-12-11 DIAGNOSIS — K642 Third degree hemorrhoids: Secondary | ICD-10-CM | POA: Diagnosis not present

## 2014-12-11 NOTE — Progress Notes (Signed)
Primary Care Physician:  Asencion Noble, MD Primary Gastroenterologist:  Dr. Gala Romney  Pre-Procedure History & Physical: HPI:  Debra Harris is a 64 y.o. female here for follow-up of GERD, iron deficiency anemia, rectal bleeding and hemorrhoids. Normal esophagus on recent EGD. Maloney dilator passed.  No dysphagia now. Capsule study demonstrated a couple of erosions likely related to diclofenac. However, diclofenac not felt to be a major culprit here. Colonoscopy Dr. Maurene Capes 2 years ago -  essentially negative. I have recommended a colonoscopy now. She has declined.  Continues to pass a lot of gross blood when having a bowel movement. Spends too much time on the toilet-15 minutes. Not taking a fiber supplement. Some burning and itching as well. Patient tells me she's had multiple hemorrhoid procedures by Dr. Romona Curls in [place] and locally with transient improvement in bleeding symptoms. She wants something else done. We discussed the pros and cons and hemorrhoid banding. She has known grade 3 hemorrhoids. She is a read the literature we provided her on hemorrhoid banding and is interested in proceeding    Past Medical History  Diagnosis Date  . Diabetes mellitus   . Allergic rhinitis   . Neuropathy, leg   . Ovarian cyst 11/20/00  . External hemorrhoids   . Adenomatous colon polyp   . GERD (gastroesophageal reflux disease)   . Sliding hiatal hernia   . Arthritis   . Glaucoma   . Diverticulosis   . IDA (iron deficiency anemia)   . Low tension glaucoma   . Sleep apnea     CPAP    Past Surgical History  Procedure Laterality Date  . Partial hysterectomy  age 71s  . Oophorectomy  age 43s  . Cholecystectomy    . Carpal tunnel release Bilateral   . Thumb surger Bilateral     trigger thumb  . Knee surgery      right-arthroscopy  . Cataract extraction      bilateral  . Colonoscopy  2005    Small external hemorrhoids, single sigmoid colon diverticulum, small polyp at the hepatic flexure  focally adenomatous.  . Colonoscopy  2010    Dr. Gala Romney, Versed 4/Demerol 75. Pancolonic diverticula, external hemorrhoids. Colonoscopy every 5 years for history of adenomatous polyps  . Colonoscopy  2014    Dr. Olevia Perches: Propofol. mild diverticulosis in the sigmoid colon, random colon biopsies benign.  . Esophagogastroduodenoscopy  2010    Dr. Gala Romney, distal esophageal erosions, [facility] dilator passed. Comment small hiatal hernia, patulous EG junction.  . Esophagogastroduodenoscopy (egd) with propofol N/A 10/26/2014    Dr.Jontavious Commons- distal esophageal erosions c/w mild erosive reflux esophagitis. hiatal hernia, o/w normal EGD  . Esophageal dilation N/A 10/26/2014    Procedure: ESOPHAGEAL DILATION [place];  Surgeon: Daneil Dolin, MD;  Location: [facility];  Service: Endoscopy;  Laterality: N/A;  . [place] capsule study N/A 10/26/2014    couple of mucosal breaks/erosions but essentially unremarkable small bowel study  . Rectal exam under anesthesia N/A 10/26/2014    circumferential grade 3 hemoorrhoids easily reducible    Prior to Admission medications   Medication Sig Start Date End Date Taking? Authorizing Provider  cetirizine (ZYRTEC) 10 MG tablet Take 10 mg by mouth daily.    Yes Historical Provider, MD  DICLOFENAC SODIUM PO Take 1 tablet by mouth 2 (two) times daily.   Yes Historical Provider, MD  docusate sodium (COLACE) 100 MG capsule Take 200 mg by mouth 2 (two) times daily.    Yes Historical Provider,  MD  hydrocortisone (ANUSOL-HC) 25 MG suppository Place 1 suppository (25 mg total) rectally 2 (two) times daily. 11/27/14  Yes Orvil Feil, NP  metFORMIN (GLUCOPHAGE) 500 MG tablet Take 1,000 mg by mouth daily.   Yes Historical Provider, MD  pantoprazole (PROTONIX) 40 MG tablet Take 40 mg by mouth daily.   Yes Historical Provider, MD  Probiotic Product (TRUNATURE DIGESTIVE PROBIOTIC PO) Take 1 capsule by mouth daily.   Yes Historical Provider, MD  UNKNOWN TO PATIENT CPAP machine   Yes Historical  Provider, MD  UNKNOWN TO PATIENT Thera tears gel- apply to eyes at bedtime   Yes Historical Provider, MD    Allergies as of 12/11/2014  . (No Known Allergies)    Family History  Problem Relation Age of Onset  . Ulcers Mother   . Colon cancer Neg Hx   . Glaucoma Mother   . Glaucoma Maternal Grandmother     Social History   Social History  . Marital Status: Married    Spouse Name: J. Orland Dec  . Number of Children: Y  . Years of Education: N/A   Occupational History  . AUDIOLOGIST    Social History Main Topics  . Smoking status: Never Smoker   . Smokeless tobacco: Never Used     Comment: Pt reports tried once in 7th grade//ms  . Alcohol Use: 0.6 oz/week    1 Glasses of wine per week     Comment: rare  . Drug Use: No  . Sexual Activity: No   Other Topics Concern  . Not on file   Social History Narrative   3 children       Review of Systems: See HPI, otherwise negative ROS  Physical Exam: BP 121/71 mmHg  Pulse 68  Temp(Src) 97.4 F (36.3 C) (Oral)  Ht 5\' 3"  (1.6 m)  Wt 210 lb (95.255 kg)  BMI 37.21 kg/m2 General:   Alert,   well-nourished, pleasant and cooperative in NAD Neck:  Supple; no masses or thyromegaly. No significant cervical adenopathy. Abdomen: Non-distended, normal bowel sounds.  Soft and nontender without appreciable mass or hepatosplenomegaly.  Pulses:  Normal pulses noted. Extremities:  Without clubbing or edema.  Impression:  GERD/erosive reflux esophagitis well controlled on Protonix 40 mg daily. Dysphagia resolved  - status post passage of a Maloney dilator.  We talked about the multipronged approach to gastroesophageal reflux disease.  Deficiency anemia and hematochezia-separate issue. Ongoing iron deficiency anemia. No significant lesion to account for bleeding found on EGD or capsule study.  Last colonoscopy  -  negative by Dr. Maurene Capes 2 years ago. I've discussed the possibility of there being a rapidly growing occult lesion in  her rectum or colon causing bleeding. I recommended a colonoscopy previously and again today just to cover all bases to make sure she doesn't have an occult lesion producing bleeding. Not identifying such a lesion early on could negatively affect her health. This was specifically discussed.  She is not interested in a colonoscopy at this time. She is convinced she is bleeding from her hemorrhoids. Her husband has made the observation she does have significant hemorrhoid disease and this may will be the case. I have seen hemorrhoid bleeding producing a significant iron deficiency anemia due to blood loss.  She still spends too much time on the toilet. She takes Colace daily., However, she does not take a fiber supplement as previously recommended. For now, she wants something done about her symptomatic hemorrhoids. She's read the pamphlet on  [facility] banding. I have reviewed the risk, benefits, limitations alternatives. She understands that I could not guarantee her 100% resolution of all her some hemorrhoid symptoms.  [place] banding procedure note:  The patient presents with symptomatic grade 3 hemorrhoids unresponsive to edical therapy requesting rubber band ligation of her hemorrhoidal disease. All risks, benefits, and alternative forms of therapy were described and informed consent was obtained.  In the left lateral decubitus position, DRE performed revealing fairly tight sphincter tone;  anoscopic examination revealed grade 3 hemorrhoids all 3 positions. Right anterior hemorrhoid appears angry.  A pea-sized amount of NTG/Xylocaine cream placed in the anal canal.  The decision was made to band the right anterior internal hemorrhoid; the [place] was used to perform band ligation without complication. Digital anorectal examination was then performed to assure proper positioning of the band and to adjust the banded tissue as required. The patient was discharged home without pain or other  issues. Dietary and behavioral recommendations were given;  Prescription for nitroglycerin ointment 0.125% applied to the anorectum twice a day provided. 30 g dispensed.atient will return in 3 for followup and possible additional banding as required.  No complications were encountered and the patient tolerated the procedure well.   Recommendations: Avoid straining.  Nitroglycerin ointment 0.125%-prescription. Dispense 30 g.-Apply pea-sized amount to internally 2-3 times a day for 2 weeks  Benefiber 2 teaspoons twice daily  Continue Colace 100 mg twice daily  Limit toilet time to 2-3 minutes  Call with any interim problems  Schedule followup appointment in 3 weeks from now  As discussed, colonoscopy has been recommended today and previously.  If you experience significant improvement in bleeding / iron deficiency anemia over the short run with hemorrhoid banding it may not be needed.      Notice: This dictation was prepared with Dragon dictation along with smaller phrase technology. Any transcriptional errors that result from this process are unintentional and may not be corrected upon review.

## 2014-12-11 NOTE — Patient Instructions (Signed)
GERD information provided  Continue Protonix 40 mg daily  Avoid straining.  Nitroglycerin ointment 0.125%-prescription. Dispense 30 g.-Apply pea-sized amount to internally 2-3 times a day for 2 weeks  Benefiber 2 teaspoons twice daily  Continue Colace 100 mg twice daily  Limit toilet time to 2-3 minutes  Call with any interim problems  Schedule followup appointment in 3 weeks from now  As discussed, colonoscopy has been recommended today and previously.  If you experience significant improvement in bleeding / iron deficiency anemia over the short run with hemorrhoid banding it may not be needed.

## 2014-12-18 ENCOUNTER — Encounter (HOSPITAL_COMMUNITY): Payer: Self-pay

## 2014-12-18 ENCOUNTER — Encounter (HOSPITAL_COMMUNITY): Payer: BLUE CROSS/BLUE SHIELD | Attending: Hematology & Oncology

## 2014-12-18 DIAGNOSIS — D509 Iron deficiency anemia, unspecified: Secondary | ICD-10-CM | POA: Diagnosis not present

## 2014-12-18 DIAGNOSIS — K921 Melena: Secondary | ICD-10-CM | POA: Insufficient documentation

## 2014-12-18 MED ORDER — FERUMOXYTOL INJECTION 510 MG/17 ML
510.0000 mg | Freq: Once | INTRAVENOUS | Status: AC
  Administered 2014-12-18: 510 mg via INTRAVENOUS
  Filled 2014-12-18: qty 17

## 2014-12-18 MED ORDER — SODIUM CHLORIDE 0.9 % IV SOLN
INTRAVENOUS | Status: DC
  Administered 2014-12-18: 12:00:00 via INTRAVENOUS

## 2014-12-18 NOTE — Progress Notes (Signed)
1225:  Tolerated infusion w/o adverse reaction.  VSS.  In no distress.  Discharged ambulatory.

## 2014-12-18 NOTE — Patient Instructions (Signed)
[facility] at [facility] Discharge Instructions  RECOMMENDATIONS MADE BY THE CONSULTANT AND ANY TEST RESULTS WILL BE SENT TO YOUR REFERRING PHYSICIAN.  Iron infusion today. Return as scheduled for lab work and office visit.    Thank you for choosing  [facility] at [facility] to provide your oncology and hematology care.  To afford each patient quality time with our provider, please arrive at least 15 minutes before your scheduled appointment time.    You need to re-schedule your appointment should you arrive 10 or more minutes late.  We strive to give you quality time with our providers, and arriving late affects you and other patients whose appointments are after yours.  Also, if you no show three or more times for appointments you may be dismissed from the clinic at the providers discretion.     Again, thank you for choosing [facility].  Our hope is that these requests will decrease the amount of time that you wait before being seen by our physicians.       _____________________________________________________________  Should you have questions after your visit to [facility], please contact our office at [phone] between the hours of 8:30 a.m. and 4:30 p.m.  Voicemails left after 4:30 p.m. will not be returned until the following business day.  For prescription refill requests, have your pharmacy contact our office.    

## 2015-01-01 ENCOUNTER — Encounter (HOSPITAL_COMMUNITY): Payer: BLUE CROSS/BLUE SHIELD | Attending: Hematology & Oncology

## 2015-01-01 DIAGNOSIS — K921 Melena: Secondary | ICD-10-CM | POA: Diagnosis not present

## 2015-01-01 DIAGNOSIS — D509 Iron deficiency anemia, unspecified: Secondary | ICD-10-CM | POA: Insufficient documentation

## 2015-01-01 LAB — CBC WITH DIFFERENTIAL/PLATELET
BASOS PCT: 1 %
Basophils Absolute: 0 10*3/uL (ref 0.0–0.1)
Eosinophils Absolute: 0.1 10*3/uL (ref 0.0–0.7)
Eosinophils Relative: 2 %
HEMATOCRIT: 38.7 % (ref 36.0–46.0)
HEMOGLOBIN: 12.3 g/dL (ref 12.0–15.0)
LYMPHS ABS: 1.5 10*3/uL (ref 0.7–4.0)
LYMPHS PCT: 25 %
MCH: 26.7 pg (ref 26.0–34.0)
MCHC: 31.8 g/dL (ref 30.0–36.0)
MCV: 83.9 fL (ref 78.0–100.0)
MONO ABS: 0.4 10*3/uL (ref 0.1–1.0)
MONOS PCT: 6 %
NEUTROS ABS: 4 10*3/uL (ref 1.7–7.7)
NEUTROS PCT: 66 %
Platelets: 240 10*3/uL (ref 150–400)
RBC: 4.61 MIL/uL (ref 3.87–5.11)
RDW: 14.7 % (ref 11.5–15.5)
WBC: 6.1 10*3/uL (ref 4.0–10.5)

## 2015-01-01 LAB — FERRITIN: Ferritin: 213 ng/mL (ref 11–307)

## 2015-01-04 ENCOUNTER — Other Ambulatory Visit (HOSPITAL_COMMUNITY): Payer: Self-pay | Admitting: Hematology & Oncology

## 2015-01-08 ENCOUNTER — Encounter: Payer: BLUE CROSS/BLUE SHIELD | Admitting: Internal Medicine

## 2015-01-12 ENCOUNTER — Encounter: Payer: Self-pay | Admitting: Pulmonary Disease

## 2015-01-12 ENCOUNTER — Ambulatory Visit (INDEPENDENT_AMBULATORY_CARE_PROVIDER_SITE_OTHER): Payer: BLUE CROSS/BLUE SHIELD | Admitting: Pulmonary Disease

## 2015-01-12 ENCOUNTER — Ambulatory Visit (INDEPENDENT_AMBULATORY_CARE_PROVIDER_SITE_OTHER): Payer: BLUE CROSS/BLUE SHIELD | Admitting: Internal Medicine

## 2015-01-12 ENCOUNTER — Encounter: Payer: Self-pay | Admitting: Internal Medicine

## 2015-01-12 VITALS — BP 122/74 | HR 67 | Temp 97.6°F | Ht 63.5 in | Wt 213.8 lb

## 2015-01-12 VITALS — BP 124/78 | HR 67 | Ht 63.5 in | Wt 214.8 lb

## 2015-01-12 DIAGNOSIS — K642 Third degree hemorrhoids: Secondary | ICD-10-CM

## 2015-01-12 DIAGNOSIS — D6489 Other specified anemias: Secondary | ICD-10-CM

## 2015-01-12 DIAGNOSIS — J302 Other seasonal allergic rhinitis: Secondary | ICD-10-CM | POA: Diagnosis not present

## 2015-01-12 DIAGNOSIS — Z9989 Dependence on other enabling machines and devices: Secondary | ICD-10-CM

## 2015-01-12 DIAGNOSIS — K648 Other hemorrhoids: Secondary | ICD-10-CM

## 2015-01-12 DIAGNOSIS — G4733 Obstructive sleep apnea (adult) (pediatric): Secondary | ICD-10-CM

## 2015-01-12 NOTE — Patient Instructions (Signed)
Avoid straining.  Benefiber 2 teaspoons twice daily  Limit toilet time to 5 minutes  Continue with nitroglycerin cream 2-3 times daily as directed  Call with any interim problems  Schedule followup appointment in 3 weeks from now

## 2015-01-12 NOTE — Progress Notes (Signed)
Subjective:    Patient ID: Debra Harris, female    DOB: 02-12-1951, 64 y.o.   MRN: [id number]  C.C.:  Follow-up for severe OSA, Anemia, & Allergic Rhinitis.  HPI Severe OSA: Patient underwent split-night sleep study in September 2012 showing severe OSA with excellent response on treatment with CPAP at 13 cm H2O. She reports that her machine is cutting off intermittently throughout the night & "parts are falling off". She reports her sleep quality has declined over the last 2-3 months. She does have headaches in the morning at times. She does wake up with dyspnea in the middle in the night. She uses a nasal mask and has only mild sinus congestion that is helped with Zyrtec.  Anemia:  Reports she is having repeated iron infusions. She is having banding of her internal hemorrhoids. She reports previously it was frank hematochezia. She has had some nausea.   Allergic Rhinitis:  She reports only mild seasonal allergies. She routinely uses Zyrtec. Denies any current sinus congestion or pressure.  Review of Systems No chest pressure but she does have infrequent chest pains. No fever, chills, or sweats.  No Known Allergies  Current Outpatient Prescriptions on File Prior to Visit  Medication Sig Dispense Refill  . cetirizine (ZYRTEC) 10 MG tablet Take 10 mg by mouth daily.     Marland Kitchen DICLOFENAC SODIUM PO Take 1 tablet by mouth 2 (two) times daily.    . metFORMIN (GLUCOPHAGE) 500 MG tablet Take 1,000 mg by mouth daily.    . pantoprazole (PROTONIX) 40 MG tablet Take 40 mg by mouth daily.    . Probiotic Product (TRUNATURE DIGESTIVE PROBIOTIC PO) Take 1 capsule by mouth daily.    Marland Kitchen UNKNOWN TO PATIENT CPAP machine    . UNKNOWN TO PATIENT Thera tears gel- apply to eyes at bedtime     No current facility-administered medications on file prior to visit.    Past Medical History  Diagnosis Date  . Diabetes mellitus   . Allergic rhinitis   . Neuropathy, leg   . Ovarian cyst 11/20/00  . External  hemorrhoids   . Adenomatous colon polyp   . GERD (gastroesophageal reflux disease)   . Sliding hiatal hernia   . Arthritis   . Glaucoma   . Diverticulosis   . IDA (iron deficiency anemia)   . Low tension glaucoma   . Sleep apnea     CPAP  . Hemorrhoids     grade 3   . Fibromyalgia     Past Surgical History  Procedure Laterality Date  . Partial hysterectomy  age 87s  . Oophorectomy  age 58s  . Cholecystectomy    . Carpal tunnel release Bilateral   . Thumb surger Bilateral     trigger thumb  . Knee surgery      right-arthroscopy  . Cataract extraction      bilateral  . Colonoscopy  2005    Small external hemorrhoids, single sigmoid colon diverticulum, small polyp at the hepatic flexure focally adenomatous.  . Colonoscopy  2010    Dr. Gala Romney, Versed 4/Demerol 75. Pancolonic diverticula, external hemorrhoids. Colonoscopy every 5 years for history of adenomatous polyps  . Colonoscopy  2014    Dr. Olevia Perches: Propofol. mild diverticulosis in the sigmoid colon, random colon biopsies benign.  . Esophagogastroduodenoscopy  2010    Dr. Gala Romney, distal esophageal erosions, [facility] dilator passed. Comment small hiatal hernia, patulous EG junction.  . Esophagogastroduodenoscopy (egd) with propofol N/A 10/26/2014    Dr.Rourk-  distal esophageal erosions c/w mild erosive reflux esophagitis. hiatal hernia, o/w normal EGD  . Esophageal dilation N/A 10/26/2014    Procedure: ESOPHAGEAL DILATION [place];  Surgeon: Daneil Dolin, MD;  Location: [facility];  Service: Endoscopy;  Laterality: N/A;  . Givens capsule study N/A 10/26/2014    couple of mucosal breaks/erosions but essentially unremarkable small bowel study  . Rectal exam under anesthesia N/A 10/26/2014    circumferential grade 3 hemoorrhoids easily reducible    Family History  Problem Relation Age of Onset  . Ulcers Mother   . Glaucoma Mother   . Heart disease Mother   . Colon cancer Neg Hx   . Glaucoma Maternal Grandmother   . Heart  disease Maternal Grandmother   . Heart disease Maternal Grandfather     Social History   Social History  . Marital Status: Married    Spouse Name: J. Orland Dec  . Number of Children: Y  . Years of Education: N/A   Occupational History  . AUDIOLOGIST    Social History Main Topics  . Smoking status: Never Smoker   . Smokeless tobacco: Never Used     Comment: Pt reports tried once in 7th grade//ms  . Alcohol Use: 0.6 oz/week    1 Glasses of wine per week     Comment: rare  . Drug Use: No  . Sexual Activity: No   Other Topics Concern  . None   Social History Narrative   3 children & married to orthopedist. 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         Objective:   Physical Exam BP 124/78 mmHg  Pulse 67  Ht 5' 3.5" (1.613 m)  Wt 214 lb 12.8 oz (97.433 kg)  BMI 37.45 kg/m2  SpO2 98% General:  Awake. Alert. No acute distress. Mild central obesity.  Integument:  Warm & dry. No rash on exposed skin.  Lymphatics:  No appreciated cervical or supraclavicular lymphadenoapthy. HEENT:  Moist mucus membranes. No oral ulcers. No scleral injection or icterus. Moderate bilateral nasal turbinate swelling Cardiovascular:  Regular rate & rhythm. No appreciable JVD.  Pulmonary:  Good aeration & clear to auscultation bilaterally. Symmetric chest wall expansion. No accessory muscle use on room air. Musculoskeletal:  Normal bulk and tone. No joint deformity or effusion appreciated.  POLYSOMNOGRAM 11/25/2010:  AHI 68.6 events/hr. low saturation 81% during REM sleep. Periodic limb movement index 0. Average heart rate 69 bpm with no significant dysrhythmias noted. Optimal CPAP 13cm H2O.  COMPLIANCE DOWNLOAD 10/15/14 - 01/12/15:  S9 [employer]oset CPAP. Maximum pressure of 15 cm H2O & minimum pressure 57 m H2O. Residual AHI 0.8 events/hour. 99% usage with greater than 4 hours. Used 89 out of 90 days.  LABS 01/01/15 CBC: 6.1/12.3/38.7/240 Ferritin: 213 (46 on 11/19/14)    Assessment &  Plan:  64 year old female with underlying severe OSA & allergic rhinitis. Allergic rhinitis seems to be well-controlled. Reviewing her compliance download she has excellent usage of her device with nearly negligible residual AHI. However, she is having ongoing issues mechanically with her home CPAP which is now slightly over 63 years old. The device may need to be replaced. We will have her DME company investigate her home CPAP further. She is continuing to undergo treatment for her anemia with reported improvement in her hematochezia. I instructed the patient contact my office if she had any new breathing problems before her next appointment. Patient requested that I forward this note to her husband for his  review.  1. Severe OSA: Home DME company to evaluate CPAP for potential repair versus replacement. Patient to continue home CPAP indefinitely. Plan for compliance download for 30 days immediately prior to next appointment. 2. Anemia: Improving with current therapies of hemorrhoidal banding & iron infusion. 3. Allergic rhinitis: Currently asymptomatic. Continuing Zyrtec. 4. Follow-up: Patient to return to clinic in 3 months or sooner if needed.

## 2015-01-12 NOTE — Progress Notes (Signed)
[place] banding procedure note:  The patient presents with symptomatic grade 3 hemorrhoids;  Status post banding of right anterior hemorrhoid, a few weeks ago; patient states bleeding has improved considerably. She spends less time on the toilet. She is using Benefiber regularly and nitroglycerin sporadically. Overall, she feels we are going in the right direction;  She desires another band placed today.. All risks, benefits, and alternative forms of therapy were described and informed consent was obtained.  In the left lateral decubitus position, digital rectal exam revealed no obvious hemorrhoid tissue.The decision was made to band the left internal hemorrhoid; A pea-sized amount of nitroglycerin 0.125% was placed in the anorectum;the [place] was used to perform band ligation without complication. Digital anorectal examination was then performed to assure proper positioning of the band;  band felt to be in excellent position; patient states no pain or pinching after procedure  -  just described as pressure.  Patient will be observed for 10 minutes prior to discharge.  Dietary and behavioral recommendations were given and (if necessary prescriptions were given), along with follow-up instructions. The patient will return in 3 for followup and possible additional banding as required.  No complications were encountered and the patient tolerated the procedure well.

## 2015-01-12 NOTE — Patient Instructions (Signed)
1. We will have your home CPAP investigated and if necessary replaced. 2. I will have a download performed prior to her next appointment to review. 3. Please call my office if you have any further issues or questions before your next appointment. 4. I will see you back in 3 months or sooner if needed.

## 2015-01-12 NOTE — Addendum Note (Signed)
Addended by: Inge Rise on: 01/12/2015 04:13 PM   Modules accepted: Orders

## 2015-02-02 ENCOUNTER — Encounter: Payer: BLUE CROSS/BLUE SHIELD | Admitting: Internal Medicine

## 2015-02-12 ENCOUNTER — Encounter (HOSPITAL_COMMUNITY): Payer: BLUE CROSS/BLUE SHIELD

## 2015-03-22 ENCOUNTER — Encounter (HOSPITAL_COMMUNITY): Payer: BLUE CROSS/BLUE SHIELD | Attending: Hematology & Oncology

## 2015-03-22 DIAGNOSIS — D509 Iron deficiency anemia, unspecified: Secondary | ICD-10-CM | POA: Diagnosis present

## 2015-03-22 DIAGNOSIS — K921 Melena: Secondary | ICD-10-CM | POA: Insufficient documentation

## 2015-03-22 LAB — CBC WITH DIFFERENTIAL/PLATELET
Basophils Absolute: 0 10*3/uL (ref 0.0–0.1)
Basophils Relative: 1 %
Eosinophils Absolute: 0.3 10*3/uL (ref 0.0–0.7)
Eosinophils Relative: 5 %
HEMATOCRIT: 35.4 % — AB (ref 36.0–46.0)
HEMOGLOBIN: 11.4 g/dL — AB (ref 12.0–15.0)
LYMPHS ABS: 1.3 10*3/uL (ref 0.7–4.0)
LYMPHS PCT: 23 %
MCH: 27.1 pg (ref 26.0–34.0)
MCHC: 32.2 g/dL (ref 30.0–36.0)
MCV: 84.3 fL (ref 78.0–100.0)
MONOS PCT: 9 %
Monocytes Absolute: 0.5 10*3/uL (ref 0.1–1.0)
NEUTROS ABS: 3.7 10*3/uL (ref 1.7–7.7)
NEUTROS PCT: 62 %
Platelets: 243 10*3/uL (ref 150–400)
RBC: 4.2 MIL/uL (ref 3.87–5.11)
RDW: 16 % — ABNORMAL HIGH (ref 11.5–15.5)
WBC: 5.9 10*3/uL (ref 4.0–10.5)

## 2015-03-22 LAB — FERRITIN: Ferritin: 13 ng/mL (ref 11–307)

## 2015-03-23 ENCOUNTER — Other Ambulatory Visit (HOSPITAL_COMMUNITY): Payer: Self-pay | Admitting: Hematology & Oncology

## 2015-03-24 ENCOUNTER — Encounter (HOSPITAL_COMMUNITY): Payer: Self-pay | Admitting: Hematology & Oncology

## 2015-03-24 ENCOUNTER — Encounter (HOSPITAL_BASED_OUTPATIENT_CLINIC_OR_DEPARTMENT_OTHER): Payer: BLUE CROSS/BLUE SHIELD | Admitting: Hematology & Oncology

## 2015-03-24 ENCOUNTER — Encounter (HOSPITAL_COMMUNITY): Payer: BLUE CROSS/BLUE SHIELD

## 2015-03-24 VITALS — BP 145/68 | HR 67 | Temp 98.1°F | Resp 18 | Wt 220.0 lb

## 2015-03-24 DIAGNOSIS — D509 Iron deficiency anemia, unspecified: Secondary | ICD-10-CM

## 2015-03-24 DIAGNOSIS — K921 Melena: Secondary | ICD-10-CM | POA: Diagnosis not present

## 2015-03-24 NOTE — Patient Instructions (Signed)
[place] at [facility] Discharge Instructions  RECOMMENDATIONS MADE BY THE CONSULTANT AND ANY TEST RESULTS WILL BE SENT TO YOUR REFERRING PHYSICIAN.    Exam and discussion completed by Dr Whitney Muse today Iron infusion as scheduled. Anytime in between your standing orders for labs you can call and we can check your labs. Labs every 6 weeks.   Return to see the doctor in 6 months. Please call the clinic if you have any questions or concerns     Thank you for choosing [place] at [facility] to provide your oncology and hematology care.  To afford each patient quality time with our provider, please arrive at least 15 minutes before your scheduled appointment time.   Beginning January 23rd 2017 lab work for the [facility] will be done in the  Main lab at [facility] on 1st floor. If you have a lab appointment with the [place] please come in thru the  Main Entrance and check in at the main information desk  You need to re-schedule your appointment should you arrive 10 or more minutes late.  We strive to give you quality time with our providers, and arriving late affects you and other patients whose appointments are after yours.  Also, if you no show three or more times for appointments you may be dismissed from the clinic at the providers discretion.     Again, thank you for choosing [facility].  Our hope is that these requests will decrease the amount of time that you wait before being seen by our physicians.       _____________________________________________________________  Should you have questions after your visit to [facility], please contact our office at [phone] between the hours of 8:30 a.m. and 4:30 p.m.  Voicemails left after 4:30 p.m. will not be returned until the following business day.  For prescription refill requests, have your pharmacy contact our office.

## 2015-03-24 NOTE — Progress Notes (Signed)
[place] PROGRESS NOTE  Patient Care Team: Asencion Noble, MD as PCP - General (Internal Medicine) Daneil Dolin, MD as Consulting Physician (Gastroenterology)  CHIEF COMPLAINTS/PURPOSE OF CONSULTATION:  Iron deficiency anemia Last Colonoscopy 11/01/2012 with diverticulosis CBC on 04/28/2013 shows a hemoglobin of 11.3, hematocrit of 35.9, and MCV of 75.7. RDW is elevated at 19.3 CBC dated 03/19/2013 shows a hemoglobin of 9.7, hematocrit of 30.2, MCV of 73.3. Ferritin level of 13 ng/ml on 04/2014 EGD 10/26/2014 with erosive esophagitis  HISTORY OF PRESENTING ILLNESS:  Debra Harris 65 y.o. female is here because of recurrent iron deficiency. She notes she has had multiple colonoscopies, approximately 3 in the past 9 years. She has been diagnosed with internal hemorrhoids and has rare blood in the toilet. She has not done any stool cards recently. She had seen Dr. Romona Curls at his hemorrhoid clinic in [place] many years ago.  She was seen at [place] and advised that there is no need to treat her hemorrhoids currently. Energy is improved.  Debra Harris returns to the [place] alone today.  She says she started bleeding again, and was consulted that any time she starts bleeding again, she can call in and get labs. She says that because her bleeding stopped again, she didn't call in, but understands now that she can call any time she needs iron. She says the holidays did her in because there was too much going on, "too much hard work." She says November she was excited because she saw the number 213 for her ferrigin, and was doing great. She says, "I really thought I had it." Then the holidays "started something up again," and she started bleeding and getting tired again.  She says she is set up for iron on Tuesday.  She currently denies nausea, vomiting or change in her bowels.  She has no questions regarding her blood work.  MEDICAL HISTORY:  Past Medical History  Diagnosis  Date  . Diabetes mellitus   . Allergic rhinitis   . Neuropathy, leg   . Ovarian cyst 11/20/00  . External hemorrhoids   . Adenomatous colon polyp   . GERD (gastroesophageal reflux disease)   . Sliding hiatal hernia   . Arthritis   . Glaucoma   . Diverticulosis   . IDA (iron deficiency anemia)   . Low tension glaucoma   . Sleep apnea     CPAP  . Hemorrhoids     grade 3   . Fibromyalgia     SURGICAL HISTORY: Past Surgical History  Procedure Laterality Date  . Partial hysterectomy  age 35s  . Oophorectomy  age 76s  . Cholecystectomy    . Carpal tunnel release Bilateral   . Thumb surger Bilateral     trigger thumb  . Knee surgery      right-arthroscopy  . Cataract extraction      bilateral  . Colonoscopy  2005    Small external hemorrhoids, single sigmoid colon diverticulum, small polyp at the hepatic flexure focally adenomatous.  . Colonoscopy  2010    Dr. Gala Romney, Versed 4/Demerol 75. Pancolonic diverticula, external hemorrhoids. Colonoscopy every 5 years for history of adenomatous polyps  . Colonoscopy  2014    Dr. Olevia Perches: Propofol. mild diverticulosis in the sigmoid colon, random colon biopsies benign.  . Esophagogastroduodenoscopy  2010    Dr. Gala Romney, distal esophageal erosions, [facility] dilator passed. Comment small hiatal hernia, patulous EG junction.  . Esophagogastroduodenoscopy (egd) with propofol N/A 10/26/2014  Dr.Rourk- distal esophageal erosions c/w mild erosive reflux esophagitis. hiatal hernia, o/w normal EGD  . Esophageal dilation N/A 10/26/2014    Procedure: ESOPHAGEAL DILATION [place];  Surgeon: Daneil Dolin, MD;  Location: [facility];  Service: Endoscopy;  Laterality: N/A;  . Givens capsule study N/A 10/26/2014    couple of mucosal breaks/erosions but essentially unremarkable small bowel study  . Rectal exam under anesthesia N/A 10/26/2014    circumferential grade 3 hemoorrhoids easily reducible    SOCIAL HISTORY: Social History   Social History  .  Marital Status: Married    Spouse Name: J. Orland Dec  . Number of Children: Y  . Years of Education: N/A   Occupational History  . AUDIOLOGIST    Social History Main Topics  . Smoking status: Never Smoker   . Smokeless tobacco: Never Used     Comment: Pt reports tried once in 7th grade//ms  . Alcohol Use: 0.6 oz/week    1 Glasses of wine per week     Comment: rare  . Drug Use: No  . Sexual Activity: No   Other Topics Concern  . Not on file   Social History Narrative   3 children & married to orthopedist. 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      3 children and one grandchild. She has been married for over 40 years. She is a never smoker. She does not drink alcohol. She still is very active and works outside the home.  FAMILY HISTORY: Family History  Problem Relation Age of Onset  . Ulcers Mother   . Glaucoma Mother   . Heart disease Mother   . Colon cancer Neg Hx   . Glaucoma Maternal Grandmother   . Heart disease Maternal Grandmother   . Heart disease Maternal Grandfather    has no family status information on file.  Father died when the patient was 49 from suicide. Her mother died at 19 from an MI. She had glaucoma that caused blindness. She has no siblings.   ALLERGIES:  has No Known Allergies.  MEDICATIONS:  Current Outpatient Prescriptions  Medication Sig Dispense Refill  . cetirizine (ZYRTEC) 10 MG tablet Take 10 mg by mouth daily.     Marland Kitchen DICLOFENAC SODIUM PO Take 1 tablet by mouth 2 (two) times daily.    . metFORMIN (GLUCOPHAGE) 500 MG tablet Take 1,000 mg by mouth daily.    . pantoprazole (PROTONIX) 40 MG tablet Take 40 mg by mouth daily.    . Probiotic Product (TRUNATURE DIGESTIVE PROBIOTIC PO) Take 1 capsule by mouth daily.    Marland Kitchen UNKNOWN TO PATIENT CPAP machine    . UNKNOWN TO PATIENT Thera tears gel- apply to eyes at bedtime     No current facility-administered medications for this visit.    Review of Systems  Constitutional:  Negative. HENT: Negative.   Eyes: Negative.   Respiratory: Negative.   Cardiovascular: Negative.   Gastrointestinal: Negative.   Genitourinary: Negative.   Musculoskeletal: Negative Left foot swollen. Skin: Negative.   Neurological: Negative for tingling, tremors, sensory change, speech change, focal weakness, seizures and loss of consciousness.  Endo/Heme/Allergies: Negative.   14 point review of systems was performed and is negative except as detailed under history of present illness and above    PHYSICAL EXAMINATION: ECOG PERFORMANCE STATUS: 0 - Asymptomatic  Filed Vitals:   03/24/15 1405  BP: 145/68  Pulse: 67  Temp: 98.1 F (36.7 C)  Resp: 18   Filed Weights  03/24/15 1405  Weight: 220 lb (99.791 kg)    Physical Exam  Constitutional: She is oriented to person, place, and time and well-developed, well-nourished, and in no distress.  HENT:  Head: Normocephalic and atraumatic.  Nose: Nose normal.  Mouth/Throat: Oropharynx is clear and moist. No oropharyngeal exudate.  Eyes: Conjunctivae and EOM are normal. Pupils are equal, round, and reactive to light. Right eye exhibits no discharge. Left eye exhibits no discharge. No scleral icterus.  Neck: Normal range of motion. Neck supple. No tracheal deviation present. No thyromegaly present.  Cardiovascular: Normal rate, regular rhythm and normal heart sounds.  Exam reveals no gallop and no friction rub.   No murmur heard. Pulmonary/Chest: Effort normal and breath sounds normal. She has no wheezes. She has no rales.  Abdominal: Soft. Bowel sounds are normal. She exhibits no distension and no mass. There is no tenderness. There is no rebound and no guarding.  Musculoskeletal: Normal range of motion.  Left foot edema. (chronic) Lymphadenopathy:    She has no cervical adenopathy.  Neurological: She is alert and oriented to person, place, and time. She has normal reflexes. No cranial nerve deficit. Gait normal. Coordination  normal.  Skin: Skin is warm and dry. No rash noted.  Psychiatric: Mood, memory, affect and judgment normal.  Nursing note and vitals reviewed.   LABORATORY DATA:  I have reviewed the data as listed Lab Results  Component Value Date   WBC 5.9 03/22/2015   HGB 11.4* 03/22/2015   HCT 35.4* 03/22/2015   MCV 84.3 03/22/2015   PLT 243 03/22/2015     Chemistry      Component Value Date/Time   NA 140 10/09/2014 1435   K 4.0 10/09/2014 1435   CL 105 10/09/2014 1435   CO2 27 10/09/2014 1435   BUN 14 10/09/2014 1435   CREATININE 0.84 10/09/2014 1435      Component Value Date/Time   CALCIUM 9.2 10/09/2014 1435   ALKPHOS 70 10/09/2014 1435   AST 35 10/09/2014 1435   ALT 38 10/09/2014 1435   BILITOT 0.6 10/09/2014 1435      ASSESSMENT & PLAN:  Iron deficiency anemia Iron malabsorption Chronic GI related blood loss questionable secondary to internal hemorrhoids Fatigue secondary to iron deficiency   Goal is to keep ferritin level greater than or equal to 100 ng/ml  Standing labs, ongoing. She technically doesn't have to see me until 6 months. I advised her that anytime she has questions about blood work I am of course glad to see her sooner.  She is set up for iron on Tuesday.   I reminded her that if she needs blood work in between standing intervals to call and let us know.  We will see her twice a year.  All questions were answered. The patient knows to call the clinic with any problems, questions or concerns.    This document serves as a record of services personally performed by Ancil Linsey, MD. It was created on her behalf by Toni Amend, a trained medical scribe. The creation of this record is based on the scribe's personal observations and the provider's statements to them. This document has been checked and approved by the attending provider.  I have reviewed the above documentation for accuracy and completeness, and I agree with the above.  This note was  electronically signed.  Kelby Fam. Penland MD

## 2015-03-30 ENCOUNTER — Encounter (HOSPITAL_BASED_OUTPATIENT_CLINIC_OR_DEPARTMENT_OTHER): Payer: BLUE CROSS/BLUE SHIELD

## 2015-03-30 ENCOUNTER — Encounter (HOSPITAL_COMMUNITY): Payer: Self-pay

## 2015-03-30 DIAGNOSIS — D509 Iron deficiency anemia, unspecified: Secondary | ICD-10-CM | POA: Diagnosis not present

## 2015-03-30 MED ORDER — SODIUM CHLORIDE 0.9 % IV SOLN
Freq: Once | INTRAVENOUS | Status: AC
Start: 2015-03-30 — End: 2015-03-30
  Administered 2015-03-30: 15:00:00 via INTRAVENOUS

## 2015-03-30 MED ORDER — SODIUM CHLORIDE 0.9 % IV SOLN
510.0000 mg | Freq: Once | INTRAVENOUS | Status: AC
  Administered 2015-03-30: 510 mg via INTRAVENOUS
  Filled 2015-03-30: qty 17

## 2015-03-30 MED ORDER — SODIUM CHLORIDE 0.9% FLUSH: 10.0000 mL | Freq: Once | INTRAVENOUS | Status: DC

## 2015-03-30 NOTE — Progress Notes (Signed)
..  Ambrose Pancoast tolerated well

## 2015-03-30 NOTE — Patient Instructions (Signed)
..  [place] at [facility] Discharge Instructions  RECOMMENDATIONS MADE BY THE CONSULTANT AND ANY TEST RESULTS WILL BE SENT TO YOUR REFERRING PHYSICIAN.  feraheme today (iron infusion) Return as scheduled  Thank you for choosing [place] at [facility] to provide your oncology and hematology care.  To afford each patient quality time with our provider, please arrive at least 15 minutes before your scheduled appointment time.   Beginning January 23rd 2017 lab work for the [facility] will be done in the  Main lab at [facility] on 1st floor. If you have a lab appointment with the [place] please come in thru the  Main Entrance and check in at the main information desk  You need to re-schedule your appointment should you arrive 10 or more minutes late.  We strive to give you quality time with our providers, and arriving late affects you and other patients whose appointments are after yours.  Also, if you no show three or more times for appointments you may be dismissed from the clinic at the providers discretion.     Again, thank you for choosing [facility].  Our hope is that these requests will decrease the amount of time that you wait before being seen by our physicians.       _____________________________________________________________  Should you have questions after your visit to [facility], please contact our office at [phone] between the hours of 8:30 a.m. and 4:30 p.m.  Voicemails left after 4:30 p.m. will not be returned until the following business day.  For prescription refill requests, have your pharmacy contact our office.

## 2015-04-14 ENCOUNTER — Ambulatory Visit: Payer: BLUE CROSS/BLUE SHIELD | Admitting: Pulmonary Disease

## 2015-05-05 ENCOUNTER — Encounter (HOSPITAL_COMMUNITY): Payer: Medicare Other

## 2015-06-16 ENCOUNTER — Encounter (HOSPITAL_COMMUNITY): Payer: Medicare Other

## 2015-07-28 ENCOUNTER — Other Ambulatory Visit (HOSPITAL_COMMUNITY): Payer: Self-pay | Admitting: Oncology

## 2015-07-28 ENCOUNTER — Encounter (HOSPITAL_COMMUNITY): Payer: Medicare Other | Attending: Hematology & Oncology

## 2015-07-28 DIAGNOSIS — K921 Melena: Secondary | ICD-10-CM | POA: Insufficient documentation

## 2015-07-28 DIAGNOSIS — D509 Iron deficiency anemia, unspecified: Secondary | ICD-10-CM | POA: Diagnosis not present

## 2015-07-28 LAB — CBC WITH DIFFERENTIAL/PLATELET
Basophils Absolute: 0 10*3/uL (ref 0.0–0.1)
Basophils Relative: 1 %
EOS PCT: 4 %
Eosinophils Absolute: 0.2 10*3/uL (ref 0.0–0.7)
HEMATOCRIT: 30.3 % — AB (ref 36.0–46.0)
HEMOGLOBIN: 9.2 g/dL — AB (ref 12.0–15.0)
LYMPHS PCT: 24 %
Lymphs Abs: 1.3 10*3/uL (ref 0.7–4.0)
MCH: 22.7 pg — AB (ref 26.0–34.0)
MCHC: 30.4 g/dL (ref 30.0–36.0)
MCV: 74.6 fL — AB (ref 78.0–100.0)
MONO ABS: 0.4 10*3/uL (ref 0.1–1.0)
MONOS PCT: 8 %
Neutro Abs: 3.6 10*3/uL (ref 1.7–7.7)
Neutrophils Relative %: 64 %
PLATELETS: 296 10*3/uL (ref 150–400)
RBC: 4.06 MIL/uL (ref 3.87–5.11)
RDW: 15.5 % (ref 11.5–15.5)
WBC: 5.6 10*3/uL (ref 4.0–10.5)

## 2015-07-28 LAB — FERRITIN: FERRITIN: 5 ng/mL — AB (ref 11–307)

## 2015-08-03 ENCOUNTER — Encounter (HOSPITAL_COMMUNITY): Payer: Self-pay

## 2015-08-03 ENCOUNTER — Encounter (HOSPITAL_COMMUNITY): Payer: Medicare Other | Attending: Hematology & Oncology

## 2015-08-03 ENCOUNTER — Other Ambulatory Visit: Payer: Self-pay

## 2015-08-03 DIAGNOSIS — K921 Melena: Secondary | ICD-10-CM | POA: Insufficient documentation

## 2015-08-03 DIAGNOSIS — D509 Iron deficiency anemia, unspecified: Secondary | ICD-10-CM

## 2015-08-03 MED ORDER — SODIUM CHLORIDE 0.9 % IV SOLN
510.0000 mg | Freq: Once | INTRAVENOUS | Status: AC
  Administered 2015-08-03: 510 mg via INTRAVENOUS
  Filled 2015-08-03: qty 17

## 2015-08-03 MED ORDER — SODIUM CHLORIDE 0.9 % IV SOLN
Freq: Once | INTRAVENOUS | Status: AC
  Administered 2015-08-03: 14:00:00 via INTRAVENOUS

## 2015-08-03 NOTE — Patient Instructions (Signed)
[place] at [facility] Discharge Instructions  RECOMMENDATIONS MADE BY THE CONSULTANT AND ANY TEST RESULTS WILL BE SENT TO YOUR REFERRING PHYSICIAN.  Feraheme infusion today. Return as scheduled for infusion next week. Return as scheduled for lab work and office visit.   Thank you for choosing [place] at [facility] to provide your oncology and hematology care.  To afford each patient quality time with our provider, please arrive at least 15 minutes before your scheduled appointment time.   Beginning January 23rd 2017 lab work for the [facility] will be done in the  Main lab at [facility] on 1st floor. If you have a lab appointment with the [place] please come in thru the  Main Entrance and check in at the main information desk  You need to re-schedule your appointment should you arrive 10 or more minutes late.  We strive to give you quality time with our providers, and arriving late affects you and other patients whose appointments are after yours.  Also, if you no show three or more times for appointments you may be dismissed from the clinic at the providers discretion.     Again, thank you for choosing [facility].  Our hope is that these requests will decrease the amount of time that you wait before being seen by our physicians.       _____________________________________________________________  Should you have questions after your visit to [facility], please contact our office at [phone] between the hours of 8:30 a.m. and 4:30 p.m.  Voicemails left after 4:30 p.m. will not be returned until the following business day.  For prescription refill requests, have your pharmacy contact our office.         Resources For Cancer Patients and their Caregivers ? American Cancer Society: Can assist with transportation, wigs, general needs, runs Look Good Feel Better.        419-878-3603 ? Cancer  Care: Provides financial assistance, online support groups, medication/co-pay assistance.  1-800-813-HOPE 337-107-7970) ? [place] Assists [place] cancer patients and their families through emotional , educational and financial support.  934-289-5309 ? [place] DSS Where to apply for food stamps, Medicaid and utility assistance. 530-145-8967 ? RCATS: Transportation to medical appointments. 267-314-5673 ? Social Security Administration: May apply for disability if have a Stage IV cancer. (769) 685-0226 351-324-2350 ? [employer], Disability and Transit Services: Assists with nutrition, care and transit needs. [place] Support Programs: @10RELATIVEDAYS @ > Cancer Support Group  2nd Tuesday of the month 1pm-2pm, Journey Room  > Creative Journey  3rd Tuesday of the month 1130am-1pm, Journey Room  > Look Good Feel Better  1st Wednesday of the month 10am-12 noon, Journey Room (Call [place] to register [phone])

## 2015-08-11 ENCOUNTER — Other Ambulatory Visit (HOSPITAL_COMMUNITY): Payer: Self-pay | Admitting: *Deleted

## 2015-08-11 DIAGNOSIS — K625 Hemorrhage of anus and rectum: Secondary | ICD-10-CM

## 2015-08-12 ENCOUNTER — Encounter (HOSPITAL_COMMUNITY): Payer: Self-pay

## 2015-08-12 ENCOUNTER — Encounter (HOSPITAL_BASED_OUTPATIENT_CLINIC_OR_DEPARTMENT_OTHER): Payer: Medicare Other

## 2015-08-12 VITALS — BP 143/57 | HR 75 | Temp 97.7°F | Resp 18

## 2015-08-12 DIAGNOSIS — K921 Melena: Secondary | ICD-10-CM | POA: Diagnosis not present

## 2015-08-12 DIAGNOSIS — D509 Iron deficiency anemia, unspecified: Secondary | ICD-10-CM | POA: Diagnosis present

## 2015-08-12 DIAGNOSIS — K625 Hemorrhage of anus and rectum: Secondary | ICD-10-CM

## 2015-08-12 LAB — CBC WITH DIFFERENTIAL/PLATELET
BASOS PCT: 1 %
Basophils Absolute: 0 10*3/uL (ref 0.0–0.1)
EOS ABS: 0.1 10*3/uL (ref 0.0–0.7)
EOS PCT: 2 %
HCT: 29 % — ABNORMAL LOW (ref 36.0–46.0)
Hemoglobin: 8.9 g/dL — ABNORMAL LOW (ref 12.0–15.0)
LYMPHS ABS: 1.8 10*3/uL (ref 0.7–4.0)
Lymphocytes Relative: 29 %
MCH: 23.5 pg — AB (ref 26.0–34.0)
MCHC: 30.7 g/dL (ref 30.0–36.0)
MCV: 76.5 fL — ABNORMAL LOW (ref 78.0–100.0)
MONO ABS: 0.4 10*3/uL (ref 0.1–1.0)
MONOS PCT: 6 %
Neutro Abs: 3.8 10*3/uL (ref 1.7–7.7)
Neutrophils Relative %: 62 %
PLATELETS: 264 10*3/uL (ref 150–400)
RBC: 3.79 MIL/uL — ABNORMAL LOW (ref 3.87–5.11)
RDW: 20.2 % — AB (ref 11.5–15.5)
WBC: 6.2 10*3/uL (ref 4.0–10.5)

## 2015-08-12 LAB — FERRITIN: FERRITIN: 160 ng/mL (ref 11–307)

## 2015-08-12 MED ORDER — SODIUM CHLORIDE 0.9 % IV SOLN
Freq: Once | INTRAVENOUS | Status: AC
Start: 2015-08-12 — End: 2015-08-12
  Administered 2015-08-12: 14:00:00 via INTRAVENOUS

## 2015-08-12 MED ORDER — SODIUM CHLORIDE 0.9 % IV SOLN
510.0000 mg | Freq: Once | INTRAVENOUS | Status: AC
  Administered 2015-08-12: 510 mg via INTRAVENOUS
  Filled 2015-08-12: qty 17

## 2015-08-12 NOTE — Patient Instructions (Signed)
[place] at [facility] Discharge Instructions  RECOMMENDATIONS MADE BY THE CONSULTANT AND ANY TEST RESULTS WILL BE SENT TO YOUR REFERRING PHYSICIAN.  IV iron infusion today Blood work today Follow up as scheduled     Thank you for choosing [place] at [facility] to provide your oncology and hematology care.  To afford each patient quality time with our provider, please arrive at least 15 minutes before your scheduled appointment time.   Beginning January 23rd 2017 lab work for the [facility] will be done in the  Main lab at [facility] on 1st floor. If you have a lab appointment with the [place] please come in thru the  Main Entrance and check in at the main information desk  You need to re-schedule your appointment should you arrive 10 or more minutes late.  We strive to give you quality time with our providers, and arriving late affects you and other patients whose appointments are after yours.  Also, if you no show three or more times for appointments you may be dismissed from the clinic at the providers discretion.     Again, thank you for choosing [facility].  Our hope is that these requests will decrease the amount of time that you wait before being seen by our physicians.       _____________________________________________________________  Should you have questions after your visit to [facility], please contact our office at [phone] between the hours of 8:30 a.m. and 4:30 p.m.  Voicemails left after 4:30 p.m. will not be returned until the following business day.  For prescription refill requests, have your pharmacy contact our office.         Resources For Cancer Patients and their Caregivers ? American Cancer Society: Can assist with transportation, wigs, general needs, runs Look Good Feel Better.        424-873-0260 ? Cancer Care: Provides financial assistance, online support  groups, medication/co-pay assistance.  1-800-813-HOPE 367-843-1279) ? [place] Assists [place] cancer patients and their families through emotional , educational and financial support.  5616609012 ? [place] DSS Where to apply for food stamps, Medicaid and utility assistance. 309 816 6913 ? RCATS: Transportation to medical appointments. 337-403-1045 ? Social Security Administration: May apply for disability if have a Stage IV cancer. (484) 454-5028 (424) 253-8992 ? [employer], Disability and Transit Services: Assists with nutrition, care and transit needs. [place] Support Programs: @10RELATIVEDAYS @ > Cancer Support Group  2nd Tuesday of the month 1pm-2pm, Journey Room  > Creative Journey  3rd Tuesday of the month 1130am-1pm, Journey Room  > Look Good Feel Better  1st Wednesday of the month 10am-12 noon, Journey Room (Call [place] to register [phone])

## 2015-08-12 NOTE — Progress Notes (Signed)
Debra Harris Tolerated iron infusion well today Discharged ambulatory after 30 minute wait period

## 2015-08-16 ENCOUNTER — Other Ambulatory Visit (HOSPITAL_COMMUNITY): Payer: Self-pay | Admitting: Oncology

## 2015-08-16 DIAGNOSIS — D509 Iron deficiency anemia, unspecified: Secondary | ICD-10-CM

## 2015-08-17 DIAGNOSIS — G609 Hereditary and idiopathic neuropathy, unspecified: Secondary | ICD-10-CM | POA: Diagnosis not present

## 2015-08-17 DIAGNOSIS — E785 Hyperlipidemia, unspecified: Secondary | ICD-10-CM | POA: Diagnosis not present

## 2015-08-17 DIAGNOSIS — Z79899 Other long term (current) drug therapy: Secondary | ICD-10-CM | POA: Diagnosis not present

## 2015-08-17 DIAGNOSIS — E119 Type 2 diabetes mellitus without complications: Secondary | ICD-10-CM | POA: Diagnosis not present

## 2015-08-20 ENCOUNTER — Ambulatory Visit (INDEPENDENT_AMBULATORY_CARE_PROVIDER_SITE_OTHER): Payer: Medicare Other | Admitting: Internal Medicine

## 2015-08-20 ENCOUNTER — Encounter: Payer: Self-pay | Admitting: Internal Medicine

## 2015-08-20 VITALS — BP 158/82 | HR 70 | Temp 97.4°F | Ht 63.0 in | Wt 216.0 lb

## 2015-08-20 DIAGNOSIS — K648 Other hemorrhoids: Secondary | ICD-10-CM | POA: Diagnosis not present

## 2015-08-20 NOTE — Progress Notes (Signed)
[place] banding procedure note:  The patient presents with symptomatic hemorrhoids;  Status post banding of the right anterior and left lateral hemorrhoid columns. States bleeding has gotten much better. Has bled quit a bit from hemorrhoids over the years. I reminded patient her last colonoscopy revealed only diverticulosis back in September 2014 by Dr. Maurene Capes. We talked about a follow-up colonoscopy since that time( with ongoing rectal bleeding); the patient has declined. Again, she states bleeding has improved although she feels like she has a flare every time she gets an iron infusion. No longer having any itching, burning or protrusion. She wishes a third banding placed today. She is doing better not spending as much time on the toilet. She is taking Benefiber 2 teaspoons twice daily., All risks, benefits, and alternative forms of therapy were described and informed consent was obtained.  In the left lateral decubitus position, a DRE revealed no abnormalities. No protruding hemorrhoid tissue seen. No mass.  I elected to place a third band on the right posterior hemorrhoid column.  The Lindsey was used to perform band ligation without complication. Digital anorectal examination was then performed to assure proper positioning of the band;  Band found to be in excellent position. No pinching or pain.The patient was discharged home without pain or other issues. Dietary and behavioral recommendations were given.  I have discussed updating her colonoscopy prior to the end of the year. We will see her back in 3 months and reassess. Clinically, she is improved.  I have asked the patient to increase Benefiber to 1 tablespoon twice daily.   No complications were encountered and the patient tolerated the procedure well.

## 2015-08-20 NOTE — Patient Instructions (Signed)
Avoid straining.  Benefiber 1 tablespoon  twice daily  Limit toilet time to 5-7 minutes  Call with any interim problems  Schedule followup appointment in 3 weeks

## 2015-08-24 DIAGNOSIS — E1140 Type 2 diabetes mellitus with diabetic neuropathy, unspecified: Secondary | ICD-10-CM | POA: Diagnosis not present

## 2015-08-24 DIAGNOSIS — E785 Hyperlipidemia, unspecified: Secondary | ICD-10-CM | POA: Diagnosis not present

## 2015-09-08 ENCOUNTER — Encounter (HOSPITAL_COMMUNITY): Payer: Medicare Other | Attending: Hematology & Oncology

## 2015-09-08 ENCOUNTER — Other Ambulatory Visit (HOSPITAL_COMMUNITY): Payer: Self-pay | Admitting: Hematology & Oncology

## 2015-09-08 DIAGNOSIS — K921 Melena: Secondary | ICD-10-CM | POA: Diagnosis not present

## 2015-09-08 DIAGNOSIS — D509 Iron deficiency anemia, unspecified: Secondary | ICD-10-CM | POA: Diagnosis not present

## 2015-09-08 LAB — CBC WITH DIFFERENTIAL/PLATELET
BASOS ABS: 0 10*3/uL (ref 0.0–0.1)
BASOS PCT: 1 %
Eosinophils Absolute: 0.1 10*3/uL (ref 0.0–0.7)
Eosinophils Relative: 3 %
HEMATOCRIT: 33.8 % — AB (ref 36.0–46.0)
HEMOGLOBIN: 10.4 g/dL — AB (ref 12.0–15.0)
LYMPHS PCT: 27 %
Lymphs Abs: 1.3 10*3/uL (ref 0.7–4.0)
MCH: 24.8 pg — ABNORMAL LOW (ref 26.0–34.0)
MCHC: 30.8 g/dL (ref 30.0–36.0)
MCV: 80.7 fL (ref 78.0–100.0)
Monocytes Absolute: 0.4 10*3/uL (ref 0.1–1.0)
Monocytes Relative: 8 %
NEUTROS ABS: 3 10*3/uL (ref 1.7–7.7)
NEUTROS PCT: 62 %
Platelets: 239 10*3/uL (ref 150–400)
RBC: 4.19 MIL/uL (ref 3.87–5.11)
RDW: 22.2 % — ABNORMAL HIGH (ref 11.5–15.5)
WBC: 4.8 10*3/uL (ref 4.0–10.5)

## 2015-09-08 LAB — FERRITIN: Ferritin: 43 ng/mL (ref 11–307)

## 2015-09-09 ENCOUNTER — Other Ambulatory Visit: Payer: Self-pay

## 2015-09-09 ENCOUNTER — Ambulatory Visit (INDEPENDENT_AMBULATORY_CARE_PROVIDER_SITE_OTHER): Payer: Medicare Other | Admitting: Gastroenterology

## 2015-09-09 ENCOUNTER — Encounter: Payer: Self-pay | Admitting: Gastroenterology

## 2015-09-09 VITALS — BP 114/58 | HR 72 | Temp 99.1°F | Ht 63.0 in | Wt 213.4 lb

## 2015-09-09 DIAGNOSIS — D509 Iron deficiency anemia, unspecified: Secondary | ICD-10-CM

## 2015-09-09 DIAGNOSIS — H401132 Primary open-angle glaucoma, bilateral, moderate stage: Secondary | ICD-10-CM | POA: Diagnosis not present

## 2015-09-09 DIAGNOSIS — K2100 Gastro-esophageal reflux disease with esophagitis, without bleeding: Secondary | ICD-10-CM

## 2015-09-09 DIAGNOSIS — E119 Type 2 diabetes mellitus without complications: Secondary | ICD-10-CM | POA: Diagnosis not present

## 2015-09-09 DIAGNOSIS — K625 Hemorrhage of anus and rectum: Secondary | ICD-10-CM

## 2015-09-09 DIAGNOSIS — R1310 Dysphagia, unspecified: Secondary | ICD-10-CM

## 2015-09-09 DIAGNOSIS — K921 Melena: Secondary | ICD-10-CM

## 2015-09-09 MED ORDER — PEG-KCL-NACL-NASULF-NA ASC-C 100 G PO SOLR: 1.0000 | ORAL | Status: DC

## 2015-09-09 MED ORDER — DEXLANSOPRAZOLE 60 MG PO CPDR: 60.0000 mg | DELAYED_RELEASE_CAPSULE | Freq: Every day | ORAL | Status: DC

## 2015-09-09 NOTE — Patient Instructions (Signed)
1. Please have your labs done at your convenience. 2. Plan for colonoscopy and possible upper endoscopy in near future.  3. Stop pantoprazole. Start Dexilant 60mg  daily, 30 minutes before first meal of your day. I have provided samples and sent a prescription to your pharmacy. If it is not covered, please let me know.

## 2015-09-09 NOTE — Progress Notes (Signed)
Primary Care Physician:  Asencion Noble, MD  Primary Gastroenterologist:  Garfield Cornea, MD   Chief Complaint  Patient presents with  . Rectal Bleeding    HPI:  Debra Harris is a 65 y.o. female here for further evaluation of rectal bleeding and IDA. Last colonoscopy in 2014 by Dr. Olevia Perches, diverticulosis, random colon bx negative. Previously has had colon polyps. EGD 2016 for dysphagia with mild distal esophageal erosions s/p empiric dilation. Capsule study at hte same time with couple of erosions likely related to diclofenac but not felt to be a major factor. She has required multiple iron infusions. Continue to pass brbpr, sometimes moderate in volume. Occurred this past week. Started bleeding while on the couch. Described significant bleeding. She has had 3 sessions of hemorrhoid banding and doesn't appreciate significant improvement in her bleeding or rectal pain. She reports soft to loose stools on colace. Denies straining. May have to have several BMs to complete evacuation. She reports her reflux is worse over the past one year on pantoprazole. Feels heartburn up into her throat and associated trouble swallowing at times. Continues with intermittent LLQ pain which she has had for several years.   09/08/15 ferritin 43, down from 160 one month ago.06/2015 was 5.Hgb 10.4 had been 8.9 one month ago.   Current Outpatient Prescriptions  Medication Sig Dispense Refill  . cetirizine (ZYRTEC) 10 MG tablet Take 10 mg by mouth daily.     Marland Kitchen docusate sodium (COLACE) 100 MG capsule Take 100 mg by mouth daily.     . metFORMIN (GLUCOPHAGE) 500 MG tablet Take 1,000 mg by mouth daily with breakfast.     . NON FORMULARY Tumeric    . pantoprazole (PROTONIX) 40 MG tablet Take 40 mg by mouth daily.    . Probiotic Product (TRUNATURE DIGESTIVE PROBIOTIC PO) Take 1 capsule by mouth daily. Reported on 08/20/2015    . UNKNOWN TO PATIENT CPAP machine    . Wheat Dextrin (BENEFIBER PO) Take by mouth.     No current  facility-administered medications for this visit.    Allergies as of 09/09/2015  . (No Known Allergies)    Past Medical History  Diagnosis Date  . Diabetes mellitus   . Allergic rhinitis   . Neuropathy, leg   . Ovarian cyst 11/20/00  . External hemorrhoids   . Adenomatous colon polyp   . GERD (gastroesophageal reflux disease)   . Sliding hiatal hernia   . Arthritis   . Glaucoma   . Diverticulosis   . IDA (iron deficiency anemia)   . Low tension glaucoma   . Sleep apnea     CPAP  . Hemorrhoids     grade 3   . Fibromyalgia     Past Surgical History  Procedure Laterality Date  . Partial hysterectomy  age 53s  . Oophorectomy  age 53s  . Cholecystectomy    . Carpal tunnel release Bilateral   . Thumb surger Bilateral     trigger thumb  . Knee surgery      right-arthroscopy  . Cataract extraction      bilateral  . Colonoscopy  2005    Small external hemorrhoids, single sigmoid colon diverticulum, small polyp at the hepatic flexure focally adenomatous.  . Colonoscopy  2010    Dr. Gala Romney, Versed 4/Demerol 75. Pancolonic diverticula, external hemorrhoids. Colonoscopy every 5 years for history of adenomatous polyps  . Colonoscopy  2014    Dr. Olevia Perches: Propofol. mild diverticulosis in the sigmoid colon, random colon biopsies  benign.  . Esophagogastroduodenoscopy  2010    Dr. Gala Romney, distal esophageal erosions, [facility] dilator passed. Comment small hiatal hernia, patulous EG junction.  . Esophagogastroduodenoscopy (egd) with propofol N/A 10/26/2014    Dr.Rourk- distal esophageal erosions c/w mild erosive reflux esophagitis. hiatal hernia, o/w normal EGD  . Esophageal dilation N/A 10/26/2014    Procedure: ESOPHAGEAL DILATION [place];  Surgeon: Daneil Dolin, MD;  Location: [facility];  Service: Endoscopy;  Laterality: N/A;  . [place] capsule study N/A 10/26/2014    couple of mucosal breaks/erosions but essentially unremarkable small bowel study  . Rectal exam under anesthesia N/A  10/26/2014    circumferential grade 3 hemoorrhoids easily reducible  . Hemorrhoid banding      Family History  Problem Relation Age of Onset  . Ulcers Mother   . Glaucoma Mother   . Heart disease Mother   . Colon cancer Neg Hx   . Glaucoma Maternal Grandmother   . Heart disease Maternal Grandmother   . Heart disease Maternal Grandfather     Social History   Social History  . Marital Status: Married    Spouse Name: J. Orland Dec  . Number of Children: Y  . Years of Education: N/A   Occupational History  . AUDIOLOGIST    Social History Main Topics  . Smoking status: Never Smoker   . Smokeless tobacco: Never Used     Comment: Pt reports tried once in 7th grade//ms  . Alcohol Use: 0.6 oz/week    1 Glasses of wine per week     Comment: rare  . Drug Use: No  . Sexual Activity: No   Other Topics Concern  . Not on file   Social History Narrative   3 children & married to orthopedist. 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         ROS:  General: Negative for anorexia, weight loss, fever, chills, weakness.+fatigue Eyes: Negative for vision changes.  ENT: Negative for hoarseness, nasal congestion. CV: Negative for chest pain, angina, palpitations, dyspnea on exertion, peripheral edema.  Respiratory: Negative for dyspnea at rest, dyspnea on exertion, cough, sputum, wheezing.  GI: See history of present illness. GU:  Negative for dysuria, hematuria, urinary incontinence, urinary frequency, nocturnal urination.  MS: Negative for joint pain, low back pain.  Derm: Negative for rash or itching.  Neuro: Negative for weakness, abnormal sensation, seizure, frequent headaches, memory loss, confusion.  Psych: Negative for anxiety, depression, suicidal ideation, hallucinations.  Endo: Negative for unusual weight change.  Heme: Negative for bruising or bleeding. Allergy: Negative for rash or hives.    Physical Examination:  BP 114/58 mmHg  Pulse 72  Temp(Src)  99.1 F (37.3 C) (Oral)  Ht 5\' 3"  (1.6 m)  Wt 213 lb 6.4 oz (96.798 kg)  BMI 37.81 kg/m2   General: Well-nourished, well-developed in no acute distress.  Head: Normocephalic, atraumatic.   Eyes: Conjunctiva pink, no icterus. Mouth: Oropharyngeal mucosa moist and pink , no lesions erythema or exudate. Neck: Supple without thyromegaly, masses, or lymphadenopathy.  Lungs: Clear to auscultation bilaterally.  Heart: Regular rate and rhythm, no murmurs rubs or gallops.  Abdomen: Bowel sounds are normal, nontender, nondistended, no hepatosplenomegaly or masses, no abdominal bruits or    hernia , no rebound or guarding.   Rectal: not performed Extremities: No lower extremity edema. No clubbing or deformities.  Neuro: Alert and oriented x 4 , grossly normal neurologically.  Skin: Warm and dry, no rash or jaundice.  Psych: Alert and cooperative, normal mood and affect.  Labs: See hpi  Imaging Studies: No results found.

## 2015-09-10 ENCOUNTER — Encounter (HOSPITAL_COMMUNITY)
Admission: RE | Admit: 2015-09-10 | Discharge: 2015-09-10 | Disposition: A | Discharge status: Home / Self Care | Payer: Medicare Other | Source: Ambulatory Visit | Attending: Internal Medicine | Admitting: Internal Medicine

## 2015-09-10 DIAGNOSIS — M1990 Unspecified osteoarthritis, unspecified site: Secondary | ICD-10-CM | POA: Diagnosis not present

## 2015-09-10 DIAGNOSIS — Z79899 Other long term (current) drug therapy: Secondary | ICD-10-CM | POA: Diagnosis not present

## 2015-09-10 DIAGNOSIS — K5730 Diverticulosis of large intestine without perforation or abscess without bleeding: Secondary | ICD-10-CM | POA: Diagnosis not present

## 2015-09-10 DIAGNOSIS — K219 Gastro-esophageal reflux disease without esophagitis: Secondary | ICD-10-CM | POA: Diagnosis not present

## 2015-09-10 DIAGNOSIS — Z86010 Personal history of colonic polyps: Secondary | ICD-10-CM | POA: Diagnosis not present

## 2015-09-10 DIAGNOSIS — K625 Hemorrhage of anus and rectum: Secondary | ICD-10-CM | POA: Diagnosis not present

## 2015-09-10 DIAGNOSIS — J3089 Other allergic rhinitis: Secondary | ICD-10-CM | POA: Diagnosis not present

## 2015-09-10 DIAGNOSIS — K644 Residual hemorrhoidal skin tags: Secondary | ICD-10-CM | POA: Diagnosis not present

## 2015-09-10 DIAGNOSIS — K449 Diaphragmatic hernia without obstruction or gangrene: Secondary | ICD-10-CM | POA: Diagnosis not present

## 2015-09-10 DIAGNOSIS — Z7984 Long term (current) use of oral hypoglycemic drugs: Secondary | ICD-10-CM | POA: Diagnosis not present

## 2015-09-10 DIAGNOSIS — G4730 Sleep apnea, unspecified: Secondary | ICD-10-CM | POA: Diagnosis not present

## 2015-09-10 DIAGNOSIS — K642 Third degree hemorrhoids: Secondary | ICD-10-CM | POA: Diagnosis not present

## 2015-09-10 DIAGNOSIS — E1140 Type 2 diabetes mellitus with diabetic neuropathy, unspecified: Secondary | ICD-10-CM | POA: Diagnosis not present

## 2015-09-10 DIAGNOSIS — Z9989 Dependence on other enabling machines and devices: Secondary | ICD-10-CM | POA: Diagnosis not present

## 2015-09-10 DIAGNOSIS — D509 Iron deficiency anemia, unspecified: Secondary | ICD-10-CM | POA: Diagnosis not present

## 2015-09-10 LAB — BASIC METABOLIC PANEL
Anion gap: 6 (ref 5–15)
BUN: 11 mg/dL (ref 6–20)
CHLORIDE: 106 mmol/L (ref 101–111)
CO2: 25 mmol/L (ref 22–32)
Calcium: 9 mg/dL (ref 8.9–10.3)
Creatinine, Ser: 0.57 mg/dL (ref 0.44–1.00)
GFR calc Af Amer: >60 mL/min (ref >60)
GFR calc non Af Amer: >60 mL/min (ref >60)
Glucose, Bld: 149 mg/dL — ABNORMAL HIGH (ref 65–99)
POTASSIUM: 3.9 mmol/L (ref 3.5–5.1)
SODIUM: 137 mmol/L (ref 135–145)

## 2015-09-10 LAB — CBC
HEMATOCRIT: 34.3 % — AB (ref 36.0–46.0)
HEMOGLOBIN: 10.6 g/dL — AB (ref 12.0–15.0)
MCH: 24.8 pg — AB (ref 26.0–34.0)
MCHC: 30.9 g/dL (ref 30.0–36.0)
MCV: 80.3 fL (ref 78.0–100.0)
Platelets: 274 10*3/uL (ref 150–400)
RBC: 4.27 MIL/uL (ref 3.87–5.11)
RDW: 22.1 % — ABNORMAL HIGH (ref 11.5–15.5)
WBC: 5.3 10*3/uL (ref 4.0–10.5)

## 2015-09-10 LAB — MAGNESIUM: MAGNESIUM: 1.9 mg/dL (ref 1.7–2.4)

## 2015-09-10 NOTE — Patient Instructions (Signed)
Your procedure is scheduled on: 09/13/15  Report to [facility] at  9:00   AM.  Call this number if you have problems the morning of surgery: [phone]   Remember:   Do not drink or eat food:After Midnight.  :  Take these medicines the morning of surgery with A SIP OF WATER: Dexilant and Zyrtec   Do not wear jewelry, make-up or nail polish.  Do not wear lotions, powders, or perfumes. You may wear deodorant.  Do not shave 48 hours prior to surgery. Men may shave face and neck.  Do not bring valuables to the hospital.  Contacts, dentures or bridgework may not be worn into surgery.  Leave suitcase in the car. After surgery it may be brought to your room.  For patients admitted to the hospital, checkout time is 11:00 AM the day of discharge.   Patients discharged the day of surgery will not be allowed to drive home.    Colonoscopy, Care After Refer to this sheet in the next few weeks. These instructions provide you with information on caring for yourself after your procedure. Your health care provider may also give you more specific instructions. Your treatment has been planned according to current medical practices, but problems sometimes occur. Call your health care provider if you have any problems or questions after your procedure. WHAT TO EXPECT AFTER THE PROCEDURE  After your procedure, it is typical to have the following:  A small amount of blood in your stool.  Moderate amounts of gas and mild abdominal cramping or bloating. HOME CARE INSTRUCTIONS  Do not drive, operate machinery, or sign important documents for 24 hours.  You may shower and resume your regular physical activities, but move at a slower pace for the first 24 hours.  Take frequent rest periods for the first 24 hours.  Walk around or put a warm pack on your abdomen to help reduce abdominal cramping and bloating.  Drink enough fluids to keep your urine clear or pale yellow.  You may resume your normal diet  as instructed by your health care provider. Avoid heavy or fried foods that are hard to digest.  Avoid drinking alcohol for 24 hours or as instructed by your health care provider.  Only take over-the-counter or prescription medicines as directed by your health care provider.  If a tissue sample (biopsy) was taken during your procedure:  Do not take aspirin or blood thinners for 7 days, or as instructed by your health care provider.  Do not drink alcohol for 7 days, or as instructed by your health care provider.  Eat soft foods for the first 24 hours. SEEK MEDICAL CARE IF: You have persistent spotting of blood in your stool 2-3 days after the procedure. SEEK IMMEDIATE MEDICAL CARE IF:  You have more than a small spotting of blood in your stool.  You pass large blood clots in your stool.  Your abdomen is swollen (distended).  You have nausea or vomiting.  You have a fever.  You have increasing abdominal pain that is not relieved with medicine.   This information is not intended to replace advice given to you by your health care provider. Make sure you discuss any questions you have with your health care provider.   Document Released: 09/28/2003 Document Revised: 12/04/2012 Document Reviewed: 10/21/2012 Elsevier Interactive Patient Education 2016 [place]. Gastrointestinal Endoscopy, Care After Refer to this sheet in the next few weeks. These instructions provide you with information on caring for yourself  after your procedure. Your caregiver may also give you more specific instructions. Your treatment has been planned according to current medical practices, but problems sometimes occur. Call your caregiver if you have any problems or questions after your procedure. HOME CARE INSTRUCTIONS  If you were given medicine to help you relax (sedative), do not drive, operate machinery, or sign important documents for 24 hours.  Avoid alcohol and hot or warm beverages for the first 24  hours after the procedure.  Only take over-the-counter or prescription medicines for pain, discomfort, or fever as directed by your caregiver. You may resume taking your normal medicines unless your caregiver tells you otherwise. Ask your caregiver when you may resume taking medicines that may cause bleeding, such as aspirin, clopidogrel, or warfarin.  You may return to your normal diet and activities on the day after your procedure, or as directed by your caregiver. Walking may help to reduce any bloated feeling in your abdomen.  Drink enough fluids to keep your urine clear or pale yellow.  You may gargle with salt water if you have a sore throat. SEEK IMMEDIATE MEDICAL CARE IF:  You have severe nausea or vomiting.  You have severe abdominal pain, abdominal cramps that last longer than 6 hours, or abdominal swelling (distention).  You have severe shoulder or back pain.  You have trouble swallowing.  You have shortness of breath, your breathing is shallow, or you are breathing faster than normal.  You have a fever or a rapid heartbeat.  You vomit blood or material that looks like coffee grounds.  You have bloody, black, or tarry stools. MAKE SURE YOU:  Understand these instructions.  Will watch your condition.  Will get help right away if you are not doing well or get worse.   This information is not intended to replace advice given to you by your health care provider. Make sure you discuss any questions you have with your health care provider.   Document Released: 09/28/2003 Document Revised: 03/06/2014 Document Reviewed: 05/16/2011 Elsevier Interactive Patient Education [employer].

## 2015-09-11 LAB — H. PYLORI ANTIBODY, IGG

## 2015-09-12 NOTE — Progress Notes (Signed)
Quick Note:  Please let patient know her Hgb is stable at 10.6, H.pylori serologies were negative. Her magnesium level was normal. Her food allergy testing is pending. ______

## 2015-09-12 NOTE — Assessment & Plan Note (Signed)
IDA requiring ongoing iron infusions. Ongoing rectal bleeding in setting of multiple hemorrhoid banding sessions. She has been offered colonoscopy on multiple occasions but declined. Patient in agreement with colonoscopy at this time. Plan on procedure with deep sedation in the OR as required in the past.  I have discussed the risks, alternatives, benefits with regards to but not limited to the risk of reaction to medication, bleeding, infection, perforation and the patient is agreeable to proceed. Written consent to be obtained.  Will update labs today.

## 2015-09-12 NOTE — Assessment & Plan Note (Signed)
Mild erosive reflux esophagitis on EGD last year. On Pantoprazole for past one year. Complains of worsened symptoms. Some intermittent dysphagia again. Switch PPI to Dexilant 60mg  daily. Plans for upcoming colonoscopy, consider repeat EGD if no significant improvement with change in PPI.  I have discussed the risks, alternatives, benefits with regards to but not limited to the risk of reaction to medication, bleeding, infection, perforation and the patient is agreeable to proceed. Written consent to be obtained.

## 2015-09-13 ENCOUNTER — Ambulatory Visit (HOSPITAL_COMMUNITY)
Admission: RE | Admit: 2015-09-13 | Discharge: 2015-09-13 | Disposition: A | Discharge status: Home Health | Payer: Medicare Other | Source: Ambulatory Visit | Attending: Internal Medicine | Admitting: Internal Medicine

## 2015-09-13 ENCOUNTER — Ambulatory Visit (HOSPITAL_COMMUNITY): Payer: Medicare Other | Admitting: Anesthesiology

## 2015-09-13 ENCOUNTER — Encounter (HOSPITAL_COMMUNITY): Payer: Self-pay | Admitting: *Deleted

## 2015-09-13 ENCOUNTER — Encounter (HOSPITAL_COMMUNITY)
Admission: RE | Disposition: A | Discharge status: Home Health | Payer: Self-pay | Source: Ambulatory Visit | Attending: Internal Medicine

## 2015-09-13 DIAGNOSIS — Z79899 Other long term (current) drug therapy: Secondary | ICD-10-CM | POA: Insufficient documentation

## 2015-09-13 DIAGNOSIS — K921 Melena: Secondary | ICD-10-CM

## 2015-09-13 DIAGNOSIS — K635 Polyp of colon: Secondary | ICD-10-CM | POA: Diagnosis not present

## 2015-09-13 DIAGNOSIS — R12 Heartburn: Secondary | ICD-10-CM

## 2015-09-13 DIAGNOSIS — K5730 Diverticulosis of large intestine without perforation or abscess without bleeding: Secondary | ICD-10-CM | POA: Insufficient documentation

## 2015-09-13 DIAGNOSIS — K219 Gastro-esophageal reflux disease without esophagitis: Secondary | ICD-10-CM | POA: Insufficient documentation

## 2015-09-13 DIAGNOSIS — R1310 Dysphagia, unspecified: Secondary | ICD-10-CM

## 2015-09-13 DIAGNOSIS — K642 Third degree hemorrhoids: Secondary | ICD-10-CM | POA: Diagnosis not present

## 2015-09-13 DIAGNOSIS — G4730 Sleep apnea, unspecified: Secondary | ICD-10-CM | POA: Insufficient documentation

## 2015-09-13 DIAGNOSIS — K625 Hemorrhage of anus and rectum: Secondary | ICD-10-CM | POA: Insufficient documentation

## 2015-09-13 DIAGNOSIS — D509 Iron deficiency anemia, unspecified: Secondary | ICD-10-CM | POA: Insufficient documentation

## 2015-09-13 DIAGNOSIS — K644 Residual hemorrhoidal skin tags: Secondary | ICD-10-CM | POA: Diagnosis not present

## 2015-09-13 DIAGNOSIS — Z7984 Long term (current) use of oral hypoglycemic drugs: Secondary | ICD-10-CM | POA: Insufficient documentation

## 2015-09-13 DIAGNOSIS — Z86010 Personal history of colonic polyps: Secondary | ICD-10-CM | POA: Insufficient documentation

## 2015-09-13 DIAGNOSIS — K449 Diaphragmatic hernia without obstruction or gangrene: Secondary | ICD-10-CM | POA: Insufficient documentation

## 2015-09-13 DIAGNOSIS — J3089 Other allergic rhinitis: Secondary | ICD-10-CM | POA: Diagnosis not present

## 2015-09-13 DIAGNOSIS — M1990 Unspecified osteoarthritis, unspecified site: Secondary | ICD-10-CM | POA: Insufficient documentation

## 2015-09-13 DIAGNOSIS — E1140 Type 2 diabetes mellitus with diabetic neuropathy, unspecified: Secondary | ICD-10-CM | POA: Diagnosis not present

## 2015-09-13 DIAGNOSIS — D649 Anemia, unspecified: Secondary | ICD-10-CM | POA: Diagnosis not present

## 2015-09-13 DIAGNOSIS — Z9989 Dependence on other enabling machines and devices: Secondary | ICD-10-CM | POA: Insufficient documentation

## 2015-09-13 HISTORY — PX: COLONOSCOPY WITH PROPOFOL: SHX5780

## 2015-09-13 HISTORY — PX: ESOPHAGOGASTRODUODENOSCOPY (EGD) WITH PROPOFOL: SHX5813

## 2015-09-13 LAB — ALLERGENS(10) FOODS
F045-IgE Yeast: <0.1 kU/L
F082-IgE Cheese, Mold Type: <0.1 kU/L
Malt: <0.1 kU/L
Milk IgE: <0.1 kU/L
Soybean IgE: <0.1 kU/L
Wheat IgE: <0.1 kU/L

## 2015-09-13 LAB — GLUCOSE, CAPILLARY
Glucose-Capillary: 120 mg/dL — ABNORMAL HIGH (ref 65–99)
Glucose-Capillary: 86 mg/dL (ref 65–99)

## 2015-09-13 SURGERY — COLONOSCOPY WITH PROPOFOL
Anesthesia: Monitor Anesthesia Care

## 2015-09-13 MED ORDER — ONDANSETRON HCL 4 MG/2ML IJ SOLN
INTRAMUSCULAR | Status: AC
  Filled 2015-09-13: qty 2

## 2015-09-13 MED ORDER — LIDOCAINE VISCOUS 2 % MT SOLN
5.0000 mL | OROMUCOSAL | Status: AC
  Administered 2015-09-13 (×2): 5 mL via OROMUCOSAL

## 2015-09-13 MED ORDER — MIDAZOLAM HCL 5 MG/5ML IJ SOLN
INTRAMUSCULAR | Status: DC | PRN
  Administered 2015-09-13: 1 mg via INTRAVENOUS

## 2015-09-13 MED ORDER — ONDANSETRON HCL 4 MG/2ML IJ SOLN: 4.0000 mg | Freq: Once | INTRAMUSCULAR | Status: DC | PRN

## 2015-09-13 MED ORDER — FENTANYL CITRATE (PF) 100 MCG/2ML IJ SOLN: 25.0000 ug | INTRAMUSCULAR | Status: DC | PRN

## 2015-09-13 MED ORDER — LACTATED RINGERS IV SOLN
INTRAVENOUS | Status: DC
  Administered 2015-09-13: 10:00:00 via INTRAVENOUS

## 2015-09-13 MED ORDER — LIDOCAINE VISCOUS 2 % MT SOLN
OROMUCOSAL | Status: AC
  Filled 2015-09-13: qty 15

## 2015-09-13 MED ORDER — ONDANSETRON HCL 4 MG/2ML IJ SOLN
4.0000 mg | Freq: Once | INTRAMUSCULAR | Status: AC
  Administered 2015-09-13: 4 mg via INTRAVENOUS

## 2015-09-13 MED ORDER — FENTANYL CITRATE (PF) 100 MCG/2ML IJ SOLN
INTRAMUSCULAR | Status: AC
  Filled 2015-09-13: qty 2

## 2015-09-13 MED ORDER — PROPOFOL 500 MG/50ML IV EMUL
INTRAVENOUS | Status: DC | PRN
  Administered 2015-09-13: 100 ug/kg/min via INTRAVENOUS

## 2015-09-13 MED ORDER — MIDAZOLAM HCL 2 MG/2ML IJ SOLN
INTRAMUSCULAR | Status: AC
  Filled 2015-09-13: qty 2

## 2015-09-13 MED ORDER — FENTANYL CITRATE (PF) 100 MCG/2ML IJ SOLN
25.0000 ug | INTRAMUSCULAR | Status: AC | PRN
  Administered 2015-09-13 (×2): 25 ug via INTRAVENOUS

## 2015-09-13 MED ORDER — MIDAZOLAM HCL 2 MG/2ML IJ SOLN
1.0000 mg | INTRAMUSCULAR | Status: DC | PRN
  Administered 2015-09-13: 2 mg via INTRAVENOUS

## 2015-09-13 NOTE — Transfer of Care (Signed)
Immediate Anesthesia Transfer of Care Note  Patient: Debra Harris  Procedure(s) Performed: Procedure(s) with comments: COLONOSCOPY WITH PROPOFOL (N/A) - 1030 ESOPHAGOGASTRODUODENOSCOPY (EGD) WITH PROPOFOL (N/A)  Patient Location: PACU  Anesthesia Type:MAC  Level of Consciousness: awake and alert   Airway & Oxygen Therapy: Patient Spontanous Breathing  Post-op Assessment: Report given to RN  Post vital signs: Reviewed  Last Vitals:  Filed Vitals:   09/13/15 1030 09/13/15 1035  BP: 116/59   Pulse:    Temp:    Resp: 17 15    Last Pain: There were no vitals filed for this visit.    Patients Stated Pain Goal: 5 ([id number])  Complications: No apparent anesthesia complications

## 2015-09-13 NOTE — Anesthesia Preprocedure Evaluation (Signed)
Anesthesia Evaluation  Patient identified by MRN, date of birth, ID band Patient awake    Reviewed: Allergy & Precautions, NPO status , Patient's Chart, lab work & pertinent test results  Airway Mallampati: II  TM Distance: >3 FB     Dental  (+) Teeth Intact   Pulmonary sleep apnea and Continuous Positive Airway Pressure Ventilation ,    breath sounds clear to auscultation       Cardiovascular negative cardio ROS   Rhythm:Regular Rate:Normal     Neuro/Psych  Neuromuscular disease    GI/Hepatic hiatal hernia, GERD  Controlled and Medicated,  Endo/Other  diabetes, Well Controlled, Type 2  Renal/GU      Musculoskeletal  (+) Arthritis , Fibromyalgia -  Abdominal   Peds  Hematology  (+) anemia ,   Anesthesia Other Findings   Reproductive/Obstetrics                             Anesthesia Physical Anesthesia Plan  ASA: II  Anesthesia Plan: MAC   Post-op Pain Management:    Induction: Intravenous  Airway Management Planned: Simple Face Mask  Additional Equipment:   Intra-op Plan:   Post-operative Plan:   Informed Consent: I have reviewed the patients History and Physical, chart, labs and discussed the procedure including the risks, benefits and alternatives for the proposed anesthesia with the patient or authorized representative who has indicated his/her understanding and acceptance.     Plan Discussed with:   Anesthesia Plan Comments:         Anesthesia Quick Evaluation

## 2015-09-13 NOTE — H&P (View-Only) (Signed)
Quick Note:  Please let patient know her Hgb is stable at 10.6, H.pylori serologies were negative. Her magnesium level was normal. Her food allergy testing is pending. ______

## 2015-09-13 NOTE — Op Note (Addendum)
[facility] Patient Name: Debra Harris Procedure Date: 09/13/2015 10:51 AM MRN: [id number] Date of Birth: 13-Oct-1950 Attending MD: Norvel Richards , MD CSN: [id number] Age: 65 Admit Type: Outpatient Procedure:                Ileo-colonoscopy - diagnostic Indications:              Hematochezia Providers:                Norvel Richards, MD, Gwenlyn Fudge RN, RN,                            Randa Spike, Technician Referring MD:              Medicines:                Ketamine per Anesthesia Complications:            No immediate complications. Estimated Blood Loss:     Estimated blood loss: none. Procedure:                Pre-Anesthesia Assessment:                           - Prior to the procedure, a History and Physical                            was performed, and patient medications and                            allergies were reviewed. The patient's tolerance of                            previous anesthesia was also reviewed. The risks                            and benefits of the procedure and the sedation                            options and risks were discussed with the patient.                            All questions were answered, and informed consent                            was obtained. Prior Anticoagulants: The patient has                            taken no previous anticoagulant or antiplatelet                            agents. ASA Grade Assessment: II - A patient with                            mild systemic disease. After reviewing the risks  and benefits, the patient was deemed in                            satisfactory condition to undergo the procedure.                           After obtaining informed consent, the colonoscope                            was passed under direct vision. Throughout the                            procedure, the patient's blood pressure, pulse, and                            oxygen  saturations were monitored continuously. The                            [id number] scope was introduced through                            the anus and advanced to the 5 cm into the ileum.                            The colonoscopy was performed without difficulty.                            The patient tolerated the procedure well. The                            quality of the bowel preparation was adequate. The                            entire colon was well visualized. The terminal                            ileum, ileocecal valve, appendiceal orifice, and                            rectum were photographed. Scope In: 10:56:11 AM Scope Out: 11:14:15 AM Scope Withdrawal Time: 0 hours 13 minutes 37 seconds  Total Procedure Duration: 0 hours 18 minutes 4 seconds  Findings:      The perianal and digital rectal examinations were normal.      Multiple small-mouthed diverticula were found in the sigmoid colon,       descending colon and transverse colon. Estimated blood loss: none.      Non-bleeding prolapsed external and internal hemorrhoids were found       during retroflexion. The hemorrhoids were moderate, medium-sized and       Grade III (internal hemorrhoids that prolapse but require manual       reduction).      The exam was otherwise without abnormality on direct and retroflexion       views.      The terminal ileum appeared normal (distal 5 cm). Impression:               -  Diverticulosis in the sigmoid colon, in the                            descending colon and in the transverse colon.                           - Non-bleeding prolapsed external and internal                            hemorrhoids.                           - The examination was otherwise normal on direct                            and retroflexion views.                           - The examined portion of the ileum was normal.                           - No specimens collected. I suspect rectal bleeding                             emanating from hemorrhoids. An element of iron                            malabsorption is not excluded. Celiac panel                            previously negative. Capsule study demonstrated                            only minimal erosions possibly NSAID related. EGD                            findings today as noted. Patient has had a [facility]                            banding by me and topical hemorrhoidal therapy                            performed Dr. Romona Curls for previously. Patient has                            residual significant hemorrhoid disease. This time,                            I feel that she would be best served by surgical                            referral for definitive hemorrhoidectomy. Moderate Sedation:      Moderate (conscious) sedation was personally administered by an       anesthesia professional. The following parameters were  monitored: oxygen       saturation, heart rate, blood pressure, respiratory rate, EKG, adequacy       of pulmonary ventilation, and response to care. Total physician       intraservice time was 35 minutes. Recommendation:           - Patient has a contact number available for                            emergencies. The signs and symptoms of potential                            delayed complications were discussed with the                            patient. Return to normal activities tomorrow.                            Written discharge instructions were provided to the                            patient.                           - Advance diet as tolerated today.                           - Continue present medications.                           - Repeat colonoscopy in 10 years for screening                            purposes. Surgical consultation for                            hemorrhoidectomy. See EGD report.                           - Return to GI office in 3 months. Procedure Code(s):        ---  Professional ---                           [phone], Colonoscopy, flexible; diagnostic, including                            collection of specimen(s) by brushing or washing,                            when performed (separate procedure) Diagnosis Code(s):        --- Professional ---                           [id number], Third degree hemorrhoids                           K92.1, Melena (includes Hematochezia)  K57.30, Diverticulosis of large intestine without                            perforation or abscess without bleeding CPT copyright 2016 [employer]. All rights reserved. The codes documented in this report are preliminary and upon coder review may  be revised to meet current compliance requirements. Cristopher Estimable. Rourk, MD Norvel Richards, MD 09/13/2015 11:33:19 AM This report has been signed electronically. Number of Addenda: 1 Addendum Number: 1   Addendum Date: 09/13/2015 11:34:18 AM      patient with superficial healing ulcer and circumferential scars just       inside the anal verge consistent with recent hemorrhoid banding. Aside       from hemorrhoids, the remainder of the rectal mucosa appeared normal. Cristopher Estimable. Rourk, MD Norvel Richards, MD 09/13/2015 11:35:12 AM This report has been signed electronically.

## 2015-09-13 NOTE — Anesthesia Postprocedure Evaluation (Signed)
Anesthesia Post Note  Patient: [name]  Procedure(s) Performed: Procedure(s) (LRB): COLONOSCOPY WITH PROPOFOL (N/A) ESOPHAGOGASTRODUODENOSCOPY (EGD) WITH PROPOFOL (N/A)  Patient location during evaluation: Short Stay Anesthesia Type: MAC Level of consciousness: awake and alert and oriented Pain management: pain level controlled Vital Signs Assessment: post-procedure vital signs reviewed and stable Respiratory status: spontaneous breathing Cardiovascular status: blood pressure returned to baseline Postop Assessment: no signs of nausea or vomiting Anesthetic complications: no    Last Vitals:  Filed Vitals:   09/13/15 1145 09/13/15 1154  BP: 120/69 127/70  Pulse: 52 70  Temp:    Resp: 16 17    Last Pain: There were no vitals filed for this visit.               Ocie Tino

## 2015-09-13 NOTE — Discharge Instructions (Signed)
Colonoscopy Discharge Instructions  Read the instructions outlined below and refer to this sheet in the next few weeks. These discharge instructions provide you with general information on caring for yourself after you leave the hospital. Your doctor may also give you specific instructions. While your treatment has been planned according to the most current medical practices available, unavoidable complications occasionally occur. If you have any problems or questions after discharge, call Dr. Gala Romney at [phone]. ACTIVITY  You may resume your regular activity, but move at a slower pace for the next 24 hours.   Take frequent rest periods for the next 24 hours.   Walking will help get rid of the air and reduce the bloated feeling in your belly (abdomen).   No driving for 24 hours (because of the medicine (anesthesia) used during the test).    Do not sign any important legal documents or operate any machinery for 24 hours (because of the anesthesia used during the test).  NUTRITION  Drink plenty of fluids.   You may resume your normal diet as instructed by your doctor.   Begin with a light meal and progress to your normal diet. Heavy or fried foods are harder to digest and may make you feel sick to your stomach (nauseated).   Avoid alcoholic beverages for 24 hours or as instructed.  MEDICATIONS  You may resume your normal medications unless your doctor tells you otherwise.  WHAT YOU CAN EXPECT TODAY  Some feelings of bloating in the abdomen.   Passage of more gas than usual.   Spotting of blood in your stool or on the toilet paper.  IF YOU HAD POLYPS REMOVED DURING THE COLONOSCOPY:  No aspirin products for 7 days or as instructed.   No alcohol for 7 days or as instructed.   Eat a soft diet for the next 24 hours.  FINDING OUT THE RESULTS OF YOUR TEST Not all test results are available during your visit. If your test results are not back during the visit, make an appointment  with your caregiver to find out the results. Do not assume everything is normal if you have not heard from your caregiver or the medical facility. It is important for you to follow up on all of your test results.  SEEK IMMEDIATE MEDICAL ATTENTION IF:  You have more than a spotting of blood in your stool.   Your belly is swollen (abdominal distention).   You are nauseated or vomiting.   You have a temperature over 101.  You have abdominal pain or discomfort that is severe or gets worse throughout the day. EGD Discharge instructions Please read the instructions outlined below and refer to this sheet in the next few weeks. These discharge instructions provide you with general information on caring for yourself after you leave the hospital. Your doctor may also give you specific instructions. While your treatment has been planned according to the most current medical practices available, unavoidable complications occasionally occur. If you have any problems or questions after discharge, please call your doctor. ACTIVITY You may resume your regular activity but move at a slower pace for the next 24 hours.  Take frequent rest periods for the next 24 hours.  Walking will help expel (get rid of) the air and reduce the bloated feeling in your abdomen.  No driving for 24 hours (because of the anesthesia (medicine) used during the test).  You may shower.  Do not sign any important legal documents or operate any machinery for 24  hours (because of the anesthesia used during the test).  NUTRITION Drink plenty of fluids.  You may resume your normal diet.  Begin with a light meal and progress to your normal diet.  Avoid alcoholic beverages for 24 hours or as instructed by your caregiver.  MEDICATIONS You may resume your normal medications unless your caregiver tells you otherwise.  WHAT YOU CAN EXPECT TODAY You may experience abdominal discomfort such as a feeling of fullness or gas pains.   FOLLOW-UP Your doctor will discuss the results of your test with you.  SEEK IMMEDIATE MEDICAL ATTENTION IF ANY OF THE FOLLOWING OCCUR: Excessive nausea (feeling sick to your stomach) and/or vomiting.  Severe abdominal pain and distention (swelling).  Trouble swallowing.  Temperature over 101 F (37.8 C).  Rectal bleeding or vomiting of blood.    GERD, hemorrhoid and diverticulosis information provided  Continue Dexilant 60 mg daily  Office visit with [name] in 3 months  Further recommendations regarding hemorrhoid management to follow.  Gastroesophageal Reflux Disease, Adult Normally, food travels down the esophagus and stays in the stomach to be digested. However, when a person has gastroesophageal reflux disease (GERD), food and stomach acid move back up into the esophagus. When this happens, the esophagus becomes sore and inflamed. Over time, GERD can create small holes (ulcers) in the lining of the esophagus.  CAUSES This condition is caused by a problem with the muscle between the esophagus and the stomach (lower esophageal sphincter, or LES). Normally, the LES muscle closes after food passes through the esophagus to the stomach. When the LES is weakened or abnormal, it does not close properly, and that allows food and stomach acid to go back up into the esophagus. The LES can be weakened by certain dietary substances, medicines, and medical conditions, including:  Tobacco use.  Pregnancy.  Having a hiatal hernia.  Heavy alcohol use.  Certain foods and beverages, such as coffee, chocolate, onions, and peppermint. RISK FACTORS This condition is more likely to develop in:  People who have an increased body weight.  People who have connective tissue disorders.  People who use NSAID medicines. SYMPTOMS Symptoms of this condition include:  Heartburn.  Difficult or painful swallowing.  The feeling of having a lump in the throat.  Abitter taste in the mouth.  Bad  breath.  Having a large amount of saliva.  Having an upset or bloated stomach.  Belching.  Chest pain.  Shortness of breath or wheezing.  Ongoing (chronic) cough or a night-time cough.  Wearing away of tooth enamel.  Weight loss. Different conditions can cause chest pain. Make sure to see your health care provider if you experience chest pain. DIAGNOSIS Your health care provider will take a medical history and perform a physical exam. To determine if you have mild or severe GERD, your health care provider may also monitor how you respond to treatment. You may also have other tests, including:  An endoscopy toexamine your stomach and esophagus with a small camera.  A test thatmeasures the acidity level in your esophagus.  A test thatmeasures how much pressure is on your esophagus.  A barium swallow or modified barium swallow to show the shape, size, and functioning of your esophagus. TREATMENT The goal of treatment is to help relieve your symptoms and to prevent complications. Treatment for this condition may vary depending on how severe your symptoms are. Your health care provider may recommend:  Changes to your diet.  Medicine.  Surgery. HOME CARE INSTRUCTIONS Diet  Follow a diet as recommended by your health care provider. This may involve avoiding foods and drinks such as:  Coffee and tea (with or without caffeine).  Drinks that containalcohol.  Energy drinks and sports drinks.  Carbonated drinks or sodas.  Chocolate and cocoa.  Peppermint and mint flavorings.  Garlic and onions.  Horseradish.  Spicy and acidic foods, including peppers, chili powder, curry powder, vinegar, hot sauces, and barbecue sauce.  Citrus fruit juices and citrus fruits, such as oranges, lemons, and limes.  Tomato-based foods, such as red sauce, chili, salsa, and pizza with red sauce.  Fried and fatty foods, such as donuts, french fries, potato chips, and high-fat  dressings.  High-fat meats, such as hot dogs and fatty cuts of red and white meats, such as rib eye steak, sausage, ham, and bacon.  High-fat dairy items, such as whole milk, butter, and cream cheese.  Eat small, frequent meals instead of large meals.  Avoid drinking large amounts of liquid with your meals.  Avoid eating meals during the 2-3 hours before bedtime.  Avoid lying down right after you eat.  Do not exercise right after you eat. General Instructions  Pay attention to any changes in your symptoms.  Take over-the-counter and prescription medicines only as told by your health care provider. Do not take aspirin, ibuprofen, or other NSAIDs unless your health care provider told you to do so.  Do not use any tobacco products, including cigarettes, chewing tobacco, and e-cigarettes. If you need help quitting, ask your health care provider.  Wear loose-fitting clothing. Do not wear anything tight around your waist that causes pressure on your abdomen.  Raise (elevate) the head of your bed 6 inches (15cm).  Try to reduce your stress, such as with yoga or meditation. If you need help reducing stress, ask your health care provider.  If you are overweight, reduce your weight to an amount that is healthy for you. Ask your health care provider for guidance about a safe weight loss goal.  Keep all follow-up visits as told by your health care provider. This is important. SEEK MEDICAL CARE IF:  You have new symptoms.  You have unexplained weight loss.  You have difficulty swallowing, or it hurts to swallow.  You have wheezing or a persistent cough.  Your symptoms do not improve with treatment.  You have a hoarse voice. SEEK IMMEDIATE MEDICAL CARE IF:  You have pain in your arms, neck, jaw, teeth, or back.  You feel sweaty, dizzy, or light-headed.  You have chest pain or shortness of breath.  You vomit and your vomit looks like blood or coffee grounds.  You  faint.  Your stool is bloody or black.  You cannot swallow, drink, or eat.   This information is not intended to replace advice given to you by your health care provider. Make sure you discuss any questions you have with your health care provider.   Hemorrhoids Hemorrhoids are swollen veins around the rectum or anus. There are two types of hemorrhoids:   Internal hemorrhoids. These occur in the veins just inside the rectum. They may poke through to the outside and become irritated and painful.  External hemorrhoids. These occur in the veins outside the anus and can be felt as a painful swelling or hard lump near the anus. CAUSES  Pregnancy.   Obesity.   Constipation or diarrhea.   Straining to have a bowel movement.   Sitting for long periods on the toilet.  Heavy lifting  or other activity that caused you to strain.  Anal intercourse. SYMPTOMS   Pain.   Anal itching or irritation.   Rectal bleeding.   Fecal leakage.   Anal swelling.   One or more lumps around the anus.  DIAGNOSIS  Your caregiver may be able to diagnose hemorrhoids by visual examination. Other examinations or tests that may be performed include:   Examination of the rectal area with a gloved hand (digital rectal exam).   Examination of anal canal using a small tube (scope).   A blood test if you have lost a significant amount of blood.  A test to look inside the colon (sigmoidoscopy or colonoscopy). TREATMENT Most hemorrhoids can be treated at home. However, if symptoms do not seem to be getting better or if you have a lot of rectal bleeding, your caregiver may perform a procedure to help make the hemorrhoids get smaller or remove them completely. Possible treatments include:   Placing a rubber band at the base of the hemorrhoid to cut off the circulation (rubber band ligation).   Injecting a chemical to shrink the hemorrhoid (sclerotherapy).   Using a tool to burn the  hemorrhoid (infrared light therapy).   Surgically removing the hemorrhoid (hemorrhoidectomy).   Stapling the hemorrhoid to block blood flow to the tissue (hemorrhoid stapling).  HOME CARE INSTRUCTIONS   Eat foods with fiber, such as whole grains, beans, nuts, fruits, and vegetables. Ask your doctor about taking products with added fiber in them (fibersupplements).  Increase fluid intake. Drink enough water and fluids to keep your urine clear or pale yellow.   Exercise regularly.   Go to the bathroom when you have the urge to have a bowel movement. Do not wait.   Avoid straining to have bowel movements.   Keep the anal area dry and clean. Use wet toilet paper or moist towelettes after a bowel movement.   Medicated creams and suppositories may be used or applied as directed.   Only take over-the-counter or prescription medicines as directed by your caregiver.   Take warm sitz baths for 15-20 minutes, 3-4 times a day to ease pain and discomfort.   Place ice packs on the hemorrhoids if they are tender and swollen. Using ice packs between sitz baths may be helpful.   Put ice in a plastic bag.   Place a towel between your skin and the bag.   Leave the ice on for 15-20 minutes, 3-4 times a day.   Do not use a donut-shaped pillow or sit on the toilet for long periods. This increases blood pooling and pain.  SEEK MEDICAL CARE IF:  You have increasing pain and swelling that is not controlled by treatment or medicine.  You have uncontrolled bleeding.  You have difficulty or you are unable to have a bowel movement.  You have pain or inflammation outside the area of the hemorrhoids. MAKE SURE YOU:  Understand these instructions.  Will watch your condition.  Will get help right away if you are not doing well or get worse.   This information is not intended to replace advice given to you by your health care provider. Make sure you discuss any questions you have  with your health care provider.   Diverticulosis Diverticulosis is the condition that develops when small pouches (diverticula) form in the wall of your colon. Your colon, or large intestine, is where water is absorbed and stool is formed. The pouches form when the inside layer of your colon pushes  through weak spots in the outer layers of your colon. CAUSES  No one knows exactly what causes diverticulosis. RISK FACTORS Being older than 48. Your risk for this condition increases with age. Diverticulosis is rare in people younger than 40 years. By age 34, almost everyone has it. Eating a low-fiber diet. Being frequently constipated. Being overweight. Not getting enough exercise. Smoking. Taking over-the-counter pain medicines, like aspirin and ibuprofen. SYMPTOMS  Most people with diverticulosis do not have symptoms. DIAGNOSIS  Because diverticulosis often has no symptoms, health care providers often discover the condition during an exam for other colon problems. In many cases, a health care provider will diagnose diverticulosis while using a flexible scope to examine the colon (colonoscopy). TREATMENT  If you have never developed an infection related to diverticulosis, you may not need treatment. If you have had an infection before, treatment may include: Eating more fruits, vegetables, and grains. Taking a fiber supplement. Taking a live bacteria supplement (probiotic). Taking medicine to relax your colon. HOME CARE INSTRUCTIONS  Drink at least 6-8 glasses of water each day to prevent constipation. Try not to strain when you have a bowel movement. Keep all follow-up appointments. If you have had an infection before: Increase the fiber in your diet as directed by your health care provider or dietitian. Take a dietary fiber supplement if your health care provider approves. Only take medicines as directed by your health care provider. SEEK MEDICAL CARE IF:  You have abdominal  pain. You have bloating. You have cramps. You have not gone to the bathroom in 3 days. SEEK IMMEDIATE MEDICAL CARE IF:  Your pain gets worse. Yourbloating becomes very bad. You have a fever or chills, and your symptoms suddenly get worse. You begin vomiting. You have bowel movements that are bloody or black. MAKE SURE YOU: Understand these instructions. Will watch your condition. Will get help right away if you are not doing well or get worse.   This information is not intended to replace advice given to you by your health care provider. Make sure you discuss any questions you have with your health care provider.   General Anesthesia, Adult, Care After Refer to this sheet in the next few weeks. These instructions provide you with information on caring for yourself after your procedure. Your health care provider may also give you more specific instructions. Your treatment has been planned according to current medical practices, but problems sometimes occur. Call your health care provider if you have any problems or questions after your procedure. WHAT TO EXPECT AFTER THE PROCEDURE After the procedure, it is typical to experience: Sleepiness. Nausea and vomiting. HOME CARE INSTRUCTIONS For the first 24 hours after general anesthesia: Have a responsible person with you. Do not drive a car. If you are alone, do not take public transportation. Do not drink alcohol. Do not take medicine that has not been prescribed by your health care provider. Do not sign important papers or make important decisions. You may resume a normal diet and activities as directed by your health care provider. Change bandages (dressings) as directed. If you have questions or problems that seem related to general anesthesia, call the hospital and ask for the anesthetist or anesthesiologist on call. SEEK MEDICAL CARE IF: You have nausea and vomiting that continue the day after anesthesia. You develop a  rash. SEEK IMMEDIATE MEDICAL CARE IF:  You have difficulty breathing. You have chest pain. You have any allergic problems.   This information is not intended to  replace advice given to you by your health care provider. Make sure you discuss any questions you have with your health care provider. °  °Document Released: 05/22/2000 Document Revised: 03/06/2014 Document Reviewed: 06/14/2011 °Elsevier Interactive Patient Education ©2016 [employer] ° °

## 2015-09-13 NOTE — Op Note (Signed)
[facility] Patient Name: Debra Harris Procedure Date: 09/13/2015 10:14 AM MRN: [id number] Date of Birth: September 29, 1950 Attending MD: Norvel Richards , MD CSN: [id number] Age: 65 Admit Type: Outpatient Procedure:                Upper GI endoscopy with [facility] dilation Indications:              Dysphagia, Heartburn Providers:                Norvel Richards, MD, Jeanann Lewandowsky. Sharon Seller, RN,                            Randa Spike, Technician Referring MD:              Medicines:                Propofol per Anesthesia Complications:            No immediate complications. Estimated Blood Loss:     Estimated blood loss: none. Procedure:                Pre-Anesthesia Assessment:                           - Prior to the procedure, a History and Physical                            was performed, and patient medications and                            allergies were reviewed. The patient's tolerance of                            previous anesthesia was also reviewed. The risks                            and benefits of the procedure and the sedation                            options and risks were discussed with the patient.                            All questions were answered, and informed consent                            was obtained. Prior Anticoagulants: The patient has                            taken no previous anticoagulant or antiplatelet                            agents. ASA Grade Assessment: II - A patient with                            mild systemic disease. After reviewing the risks  and benefits, the patient was deemed in                            satisfactory condition to undergo the procedure.                           After obtaining informed consent, the endoscope was                            passed under direct vision. Throughout the                            procedure, the patient's blood pressure, pulse, and           oxygen saturations were monitored continuously. The                            Endoscope was introduced through the mouth, and                            advanced to the second part of duodenum. The upper                            GI endoscopy was accomplished without difficulty.                            The patient tolerated the procedure well. Scope In: 10:45:00 AM Scope Out: 10:50:27 AM Total Procedure Duration: 0 hours 5 minutes 27 seconds  Findings:      The examined esophagus was normal.      A small hiatal hernia was present.      The exam was otherwise without abnormality.      The duodenal bulb and second portion of the duodenum were normal. The       scope was withdrawn. Dilation was performed with a Maloney dilator with       no resistance at 62 Fr. The dilation site was examined following       endoscope reinsertion and showed no change. Estimated blood loss: none. Impression:               - Normal esophagus. Dilated.                           - Small hiatal hernia.                           - The examination was otherwise normal.                           - Normal duodenal bulb and second portion of the                            duodenum.                           - No specimens collected. Moderate Sedation:      Moderate (conscious) sedation was personally administered by an  anesthesia professional. The following parameters were monitored: oxygen       saturation, heart rate, blood pressure, respiratory rate, EKG, adequacy       of pulmonary ventilation, and response to care. Total physician       intraservice time was 14 minutes. Recommendation:           - Patient has a contact number available for                            emergencies. The signs and symptoms of potential                            delayed complications were discussed with the                            patient. Return to normal activities tomorrow.                             Written discharge instructions were provided to the                            patient.                           - Resume regular diet today.                           - Continue present medications.                           - No repeat upper endoscopy.                           - Use Dexilant (dexlansoprazole) 60 mg PO daily                            today.                           - Return to GI clinic in 1 week.                           - Return to my office in 3 months. See colonoscopy                            report. Procedure Code(s):        --- Professional ---                           [phone], Esophagogastroduodenoscopy, flexible,                            transoral; diagnostic, including collection of                            specimen(s) by brushing or washing, when performed                            (  separate procedure)                           43450, Dilation of esophagus, by unguided sound or                            bougie, single or multiple passes Diagnosis Code(s):        --- Professional ---                           K44.9, Diaphragmatic hernia without obstruction or                            gangrene                           R13.10, Dysphagia, unspecified                           R12, Heartburn CPT copyright 2016 [employer]. All rights reserved. The codes documented in this report are preliminary and upon coder review may  be revised to meet current compliance requirements. Cristopher Estimable. Kaylla Cobos, MD Norvel Richards, MD 09/13/2015 11:18:03 AM This report has been signed electronically. Number of Addenda: 0

## 2015-09-13 NOTE — Interval H&P Note (Signed)
History and Physical Interval Note:  09/13/2015 10:14 AM  Debra Harris  has presented today for surgery, with the diagnosis of rectal bleeding/ida  The various methods of treatment have been discussed with the patient and family. After consideration of risks, benefits and other options for treatment, the patient has consented to  Procedure(s) with comments: COLONOSCOPY WITH PROPOFOL (N/A) - 1030 ESOPHAGOGASTRODUODENOSCOPY (EGD) WITH PROPOFOL (N/A) as a surgical intervention .  The patient's history has been reviewed, patient examined, no change in status, stable for surgery.  I have reviewed the patient's chart and labs.  Questions were answered to the patient's satisfaction.     Manus Rudd

## 2015-09-13 NOTE — Progress Notes (Signed)
CC'ED TO PCP 

## 2015-09-13 NOTE — Interval H&P Note (Signed)
History and Physical Interval Note:  09/13/2015 10:15 AM  Debra Harris  has presented today for surgery, with the diagnosis of rectal bleeding/ida  The various methods of treatment have been discussed with the patient and family. After consideration of risks, benefits and other options for treatment, the patient has consented to  Procedure(s) with comments: COLONOSCOPY WITH PROPOFOL (N/A) - 1030 ESOPHAGOGASTRODUODENOSCOPY (EGD) WITH PROPOFOL (N/A) as a surgical intervention .  The patient's history has been reviewed, patient examined, no change in status, stable for surgery.  I have reviewed the patient's chart and labs.  Questions were answered to the patient's satisfaction.     Debra Harris  No change. EGD/ED as feasible appropriate followed by diagnostic colonoscopy per plan.  The risks, benefits, limitations, imponderables and alternatives regarding both EGD and colonoscopy have been reviewed with the patient. Questions have been answered. All parties agreeable.

## 2015-09-14 ENCOUNTER — Other Ambulatory Visit: Payer: Self-pay

## 2015-09-14 ENCOUNTER — Telehealth: Payer: Self-pay | Admitting: Internal Medicine

## 2015-09-14 DIAGNOSIS — K649 Unspecified hemorrhoids: Secondary | ICD-10-CM

## 2015-09-14 NOTE — Telephone Encounter (Signed)
I discussed the patient's recurrent hemorrhoid bleeding with Dr. Aviva Signs. I suggested she needs a surgical hemorrhoidectomy as discussed with patient's husband yesterday. Dr. Arnoldo Morale has kindly agreed to see the patient. He has asked that we pass on his cell phone number to the patient so she can make contact with him directly  to arrange a consultation. Please provide patient with Dr. Arnoldo Morale cell phone [phone])

## 2015-09-14 NOTE — Telephone Encounter (Signed)
Referral has been made ans she is aware of the number to call Arnoldo Morale.

## 2015-09-16 DIAGNOSIS — K623 Rectal prolapse: Secondary | ICD-10-CM | POA: Diagnosis not present

## 2015-09-17 ENCOUNTER — Encounter (HOSPITAL_COMMUNITY): Payer: Self-pay | Admitting: Internal Medicine

## 2015-09-17 NOTE — Progress Notes (Signed)
Quick Note:  Looks like food allergy testing was tacked to Dr. Gala Romney therefore never sent to my box. Please let patient know that her food allergy testing was negative for the following foods   Milk IgE Class 0 kU/L <0.10  Wheat IgE Class 0 kU/L <0.10  Allergen Corn, IgE Class 0 kU/L <0.10  Peanut IgE Class 0 kU/L <0.10  Soybean IgE Class 0 kU/L <0.10  F045-IgE Yeast Class 0 kU/L <0.10  F081-IgE Cheese, Cheddar Type Class 0 kU/L <0.10  F082-IgE Cheese, Mold Type Class 0 kU/L <0.10  Malt Class 0 kU/L <0.10  F245-Ige Egg, Whole Class 0 kU/L <0.10    ______

## 2015-09-20 ENCOUNTER — Telehealth: Payer: Self-pay | Admitting: Pulmonary Disease

## 2015-09-20 ENCOUNTER — Encounter: Payer: Self-pay | Admitting: Pulmonary Disease

## 2015-09-20 ENCOUNTER — Ambulatory Visit (INDEPENDENT_AMBULATORY_CARE_PROVIDER_SITE_OTHER): Payer: Medicare Other | Admitting: Pulmonary Disease

## 2015-09-20 VITALS — BP 126/70 | HR 54 | Ht 63.0 in | Wt 208.6 lb

## 2015-09-20 DIAGNOSIS — G4733 Obstructive sleep apnea (adult) (pediatric): Secondary | ICD-10-CM | POA: Diagnosis not present

## 2015-09-20 DIAGNOSIS — K2100 Gastro-esophageal reflux disease with esophagitis, without bleeding: Secondary | ICD-10-CM

## 2015-09-20 DIAGNOSIS — K449 Diaphragmatic hernia without obstruction or gangrene: Secondary | ICD-10-CM

## 2015-09-20 DIAGNOSIS — Z9989 Dependence on other enabling machines and devices: Secondary | ICD-10-CM

## 2015-09-20 DIAGNOSIS — J302 Other seasonal allergic rhinitis: Secondary | ICD-10-CM

## 2015-09-20 NOTE — Progress Notes (Signed)
Subjective:    Patient ID: Debra Harris, female    DOB: 26-Dec-1950, 65 y.o.   MRN: [id number]  C.C.:  Follow-up for severe OSA, Allergic Rhinitis, & GERD/Hiatal Hernia.  HPI Severe OSA: Previously machine was cutting off on her at night before.  She reports she is still waking up intermittently "gasping" with the sensation that she's not able to breathe. She reports her sleep quality is better than it was but not as good as initially when she started the machine. She had her sleep study in 2012 the same year that she started on CPAP. She is using a full face mask.   Seasonal Allergic Rhinitis:  Previously taking Zyrtec & still does. Denies any significant sinus congestion, pressure, or drainage.   GERD/Hiatal Hernia:  Currently switched over to Dexilant from Prilosec about 1 week ago. Denies any morning brash water taste. She does reflux acid into the back of her throat at times. Found to have esophagitis on EGD.  Review of Systems No chest pain or pressure. Does report some discomfort from her reflux. No fever, chills, or sweats. Reports she has noticed an intermittent cough over the last couple of months and only occasional dyspnea otherwise.  No Known Allergies  Current Outpatient Prescriptions on File Prior to Visit  Medication Sig Dispense Refill  . cetirizine (ZYRTEC) 10 MG tablet Take 10 mg by mouth daily.     Marland Kitchen dexlansoprazole (DEXILANT) 60 MG capsule Take 1 capsule (60 mg total) by mouth daily. 30 capsule 3  . metFORMIN (GLUCOPHAGE) 500 MG tablet Take 1,000 mg by mouth daily with breakfast.     . peg 3350 powder (MOVIPREP) 100 g SOLR Take 1 kit (200 g total) by mouth as directed. 1 kit 0  . Probiotic Product (TRUNATURE DIGESTIVE PROBIOTIC PO) Take 1 capsule by mouth daily. Reported on 08/20/2015    . TURMERIC PO Take 1 tablet by mouth 2 (two) times daily.    Marland Kitchen UNKNOWN TO PATIENT CPAP machine    . Wheat Dextrin (BENEFIBER PO) Take by mouth.     No current facility-administered  medications on file prior to visit.     Past Medical History:  Diagnosis Date  . Adenomatous colon polyp   . Allergic rhinitis   . Arthritis   . Diabetes mellitus   . Diverticulosis   . External hemorrhoids   . Fibromyalgia   . GERD (gastroesophageal reflux disease)   . Glaucoma   . Hemorrhoids    grade 3   . IDA (iron deficiency anemia)   . Low tension glaucoma   . Neuropathy, leg   . Ovarian cyst 11/20/00  . Sleep apnea    CPAP  . Sliding hiatal hernia     Past Surgical History:  Procedure Laterality Date  . CARPAL TUNNEL RELEASE Bilateral   . CATARACT EXTRACTION     bilateral  . CHOLECYSTECTOMY    . COLONOSCOPY  2005   Small external hemorrhoids, single sigmoid colon diverticulum, small polyp at the hepatic flexure focally adenomatous.  . COLONOSCOPY  2010   Dr. Gala Romney, Versed 4/Demerol 75. Pancolonic diverticula, external hemorrhoids. Colonoscopy every 5 years for history of adenomatous polyps  . COLONOSCOPY  2014   Dr. Olevia Perches: Propofol. mild diverticulosis in the sigmoid colon, random colon biopsies benign.  . COLONOSCOPY WITH PROPOFOL N/A 09/13/2015   Procedure: COLONOSCOPY WITH PROPOFOL;  Surgeon: Daneil Dolin, MD;  [facility];  Service: Endoscopy;  Laterality: N/A;  1030  .  ESOPHAGEAL DILATION N/A 10/26/2014   Procedure: ESOPHAGEAL DILATION [place];  Surgeon: Daneil Dolin, MD;  Location: [facility];  Service: Endoscopy;  Laterality: N/A;  . ESOPHAGOGASTRODUODENOSCOPY  2010   Dr. Gala Romney, distal esophageal erosions, Venia Minks dilator passed. Comment small hiatal hernia, patulous EG junction.  . ESOPHAGOGASTRODUODENOSCOPY (EGD) WITH PROPOFOL N/A 10/26/2014   Dr.Rourk- distal esophageal erosions c/w mild erosive reflux esophagitis. hiatal hernia, o/w normal EGD  . ESOPHAGOGASTRODUODENOSCOPY (EGD) WITH PROPOFOL N/A 09/13/2015   Procedure: ESOPHAGOGASTRODUODENOSCOPY (EGD) WITH PROPOFOL;  Surgeon: Daneil Dolin, MD;  [facility];  Service: Endoscopy;   Laterality: N/A;  . GIVENS CAPSULE STUDY N/A 10/26/2014   couple of mucosal breaks/erosions but essentially unremarkable small bowel study  . HEMORRHOID BANDING    . KNEE SURGERY     right-arthroscopy  . OOPHORECTOMY  age 19s  . PARTIAL HYSTERECTOMY  age 36s  . RECTAL EXAM UNDER ANESTHESIA N/A 10/26/2014   circumferential grade 3 hemoorrhoids easily reducible  . thumb surger Bilateral    trigger thumb    Family History  Problem Relation Age of Onset  . Ulcers Mother   . Glaucoma Mother   . Heart disease Mother   . Colon cancer Neg Hx   . Glaucoma Maternal Grandmother   . Heart disease Maternal Grandmother   . Heart disease Maternal Grandfather     Social History   Social History  . Marital status: Married    Spouse name: J. Orland Dec  . Number of children: Y  . Years of education: N/A   Occupational History  . AUDIOLOGIST    Social History Main Topics  . Smoking status: Never Smoker  . Smokeless tobacco: Never Used     Comment: Pt reports tried once in 7th grade//ms  . Alcohol use 0.6 oz/week    1 Glasses of wine per week     Comment: rare  . Drug use: No  . Sexual activity: No   Other Topics Concern  . None   Social History Narrative   3 children & married to orthopedist. 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         Objective:   Physical Exam BP 126/70 (BP Location: Right Arm, Patient Position: Sitting, Cuff Size: Normal)   Pulse (!) 54   Ht _0  (1.6 m)   Wt 208 lb 9.6 oz (94.6 kg)   SpO2 100%   BMI 36.95 kg/m  General:  Awake. No distress. Mild central obesity.  Integument:  Warm & dry. No rash on exposed skin.  Lymphatics:  No appreciated cervical or supraclavicular lymphadenoapthy. HEENT:  Moist mucus membranes. No oral ulcers. No scleral injection. Minimal bilateral nasal turbinate swelling Cardiovascular:  Regular rate & rhythm. Lower extremity edema with compression stockings in place.  Pulmonary:  Clear bilaterally to  auscultation. Normal work of breathing on room air. Speaking in complete sentences. Musculoskeletal:  Normal bulk and tone. No joint deformity or effusion appreciated.  POLYSOMNOGRAM 11/25/2010:  AHI 68.6 events/hr. low saturation 81% during REM sleep. Periodic limb movement index 0. Average heart rate 69 bpm with no significant dysrhythmias noted. Optimal CPAP 13cm H2O.  COMPLIANCE DOWNLOAD 10/15/14 - 01/12/15:  S9 Autoset CPAP. Maximum pressure of 15 cm H2O & minimum pressure 57 m H2O. Residual AHI 0.8 events/hour. 99% usage with greater than 4 hours. Used 89 out of 90 days.  LABS 01/01/15 CBC: 6.1/12.3/38.7/240 Ferritin: 213 (46 on 11/19/14)    Assessment & Plan:  65 year old female  with underlying severe OSA. Suspect patient's nocturnal symptoms are due to  reflux with her underlying hiatal hernia. This is also likely contributing to her mild intermittent cough. Her allergic rhinitis is well controlled at this time and I feel not a factor. Spent a significant amount time today counseling the patient on appropriate dietary and lifestyle modifications with reflux and hiatal hernia. Instructed her to contact my office if she had any new breathing problems before her next appointment. Holding on download from her CPAP pending resolution of symptoms with dietary and lifestyle modifications.   1. Severe OSA: Continuing CPAP. Plan for download of device if symptoms persist despite reflux treatment. 2. Seasonal Allergic Rhinitis: Currently asymptomatic. Continuing Zyrtec. 3. GERD w/ Hiatal Hernia: Patient continuing on [place]. Follows with GI. Recommended avoiding eating and liquids within 2 hours of bedtime & elevation of the head of the bed 3-5 inches. May require treatment with Pepcid or Zantac if symptoms persist. 4. Health Maintenance: Plan for influenza vaccine in the fall.  5. Follow-up: Patient to return to clinic in 3 months or sooner if needed.   Sonia Baller Ashok Cordia, M.D. [facility] Pager:  [phone] After 3pm or if no response, call [phone] 10:43 AM 09/20/15

## 2015-09-20 NOTE — Telephone Encounter (Signed)
Pt seen in office today. She is requesting to get an order for a new CPAP machine. Please advise thanks

## 2015-09-20 NOTE — Telephone Encounter (Signed)
Order for CPAP download has been placed Nothing further needed at this time.

## 2015-09-20 NOTE — Patient Instructions (Signed)
   Continue with your current lifestyle changes to minimize any acid reflux & symptoms from your hiatal hernia.  Remember to elevate the head of your bed 3-5 inches.  If your nighttime awakenings don't resolve after 1-2 weeks then bring in your CPAP chip for my review.  Call me if you have any new questions or problems. I will see you back in 3 months or sooner if needed.

## 2015-09-20 NOTE — Telephone Encounter (Signed)
Let's get a download from her device to make sure her current settings are correct first. Then we will proceed once I have reviewed it. Thanks.

## 2015-09-23 ENCOUNTER — Ambulatory Visit (HOSPITAL_COMMUNITY): Payer: BLUE CROSS/BLUE SHIELD | Admitting: Oncology

## 2015-10-04 DIAGNOSIS — H401132 Primary open-angle glaucoma, bilateral, moderate stage: Secondary | ICD-10-CM | POA: Diagnosis not present

## 2015-10-11 DIAGNOSIS — H401132 Primary open-angle glaucoma, bilateral, moderate stage: Secondary | ICD-10-CM | POA: Diagnosis not present

## 2015-10-20 ENCOUNTER — Encounter (HOSPITAL_COMMUNITY): Payer: Medicare Other | Attending: Hematology & Oncology

## 2015-10-20 DIAGNOSIS — D509 Iron deficiency anemia, unspecified: Secondary | ICD-10-CM | POA: Diagnosis not present

## 2015-10-20 DIAGNOSIS — K921 Melena: Secondary | ICD-10-CM | POA: Insufficient documentation

## 2015-10-20 LAB — CBC WITH DIFFERENTIAL/PLATELET
BASOS ABS: 0.1 10*3/uL (ref 0.0–0.1)
BASOS PCT: 1 %
Eosinophils Absolute: 0.1 10*3/uL (ref 0.0–0.7)
Eosinophils Relative: 2 %
HEMATOCRIT: 34.6 % — AB (ref 36.0–46.0)
HEMOGLOBIN: 10.7 g/dL — AB (ref 12.0–15.0)
LYMPHS PCT: 26 %
Lymphs Abs: 1.6 10*3/uL (ref 0.7–4.0)
MCH: 23.8 pg — ABNORMAL LOW (ref 26.0–34.0)
MCHC: 30.9 g/dL (ref 30.0–36.0)
MCV: 77.1 fL — ABNORMAL LOW (ref 78.0–100.0)
MONOS PCT: 8 %
Monocytes Absolute: 0.5 10*3/uL (ref 0.1–1.0)
NEUTROS ABS: 4.1 10*3/uL (ref 1.7–7.7)
NEUTROS PCT: 63 %
Platelets: 312 10*3/uL (ref 150–400)
RBC: 4.49 MIL/uL (ref 3.87–5.11)
RDW: 18 % — ABNORMAL HIGH (ref 11.5–15.5)
WBC: 6.4 10*3/uL (ref 4.0–10.5)

## 2015-10-20 LAB — FERRITIN: Ferritin: 5 ng/mL — ABNORMAL LOW (ref 11–307)

## 2015-10-20 LAB — IRON AND TIBC
IRON: 24 ug/dL — AB (ref 28–170)
Saturation Ratios: 4 % — ABNORMAL LOW (ref 10.4–31.8)
TIBC: 547 ug/dL — AB (ref 250–450)
UIBC: 523 ug/dL

## 2015-10-26 ENCOUNTER — Encounter (HOSPITAL_BASED_OUTPATIENT_CLINIC_OR_DEPARTMENT_OTHER): Payer: Medicare Other

## 2015-10-26 VITALS — BP 130/66 | HR 59 | Temp 98.0°F | Resp 18

## 2015-10-26 DIAGNOSIS — K921 Melena: Secondary | ICD-10-CM | POA: Diagnosis not present

## 2015-10-26 DIAGNOSIS — D509 Iron deficiency anemia, unspecified: Secondary | ICD-10-CM | POA: Diagnosis not present

## 2015-10-26 MED ORDER — SODIUM CHLORIDE 0.9 % IV SOLN
510.0000 mg | Freq: Once | INTRAVENOUS | Status: AC
  Administered 2015-10-26: 510 mg via INTRAVENOUS
  Filled 2015-10-26: qty 17

## 2015-10-26 MED ORDER — SODIUM CHLORIDE 0.9 % IV SOLN
Freq: Once | INTRAVENOUS | Status: AC
  Administered 2015-10-26: 14:00:00 via INTRAVENOUS

## 2015-10-26 NOTE — Progress Notes (Signed)
Debra Harris tolerated Injectafer well but did report a mild headache after infusion completed. She verbalized understanding to call for unresolved headache or other issues. VSS upon discharge. Pt discharged self ambulatory in satisfactory condition

## 2015-10-26 NOTE — Patient Instructions (Signed)
[facility] at [facility] Discharge Instructions  RECOMMENDATIONS MADE BY THE CONSULTANT AND ANY TEST RESULTS WILL BE SENT TO YOUR REFERRING PHYSICIAN.  Received Injectafer today. Follow-up as scheduled. Call clinic for any questions or concerns  Thank you for choosing [facility] at [facility] to provide your oncology and hematology care.  To afford each patient quality time with our provider, please arrive at least 15 minutes before your scheduled appointment time.   Beginning January 23rd 2017 lab work for the Cancer Center will be done in the  Main lab at [facility] on 1st floor. If you have a lab appointment with the Cancer Center please come in thru the  Main Entrance and check in at the main information desk  You need to re-schedule your appointment should you arrive 10 or more minutes late.  We strive to give you quality time with our providers, and arriving late affects you and other patients whose appointments are after yours.  Also, if you no show three or more times for appointments you may be dismissed from the clinic at the providers discretion.     Again, thank you for choosing [facility].  Our hope is that these requests will decrease the amount of time that you wait before being seen by our physicians.       _____________________________________________________________  Should you have questions after your visit to  Cancer Center, please contact our office at [phone] between the hours of 8:30 a.m. and 4:30 p.m.  Voicemails left after 4:30 p.m. will not be returned until the following business day.  For prescription refill requests, have your pharmacy contact our office.         Resources For Cancer Patients and their Caregivers ? American Cancer Society: Can assist with transportation, wigs, general needs, runs Look Good Feel Better.        1-888-227-6333 ? Cancer Care: Provides financial  assistance, online support groups, medication/co-pay assistance.  1-800-813-HOPE (4673) ? [facility] Assists [place] cancer patients and their families through emotional , educational and financial support.  336-427-4357 ? [place] DSS Where to apply for food stamps, Medicaid and utility assistance. 336-342-1394 ? [facility]: Transportation to medical appointments. 336-347-2287 ? Social Security Administration: May apply for disability if have a Stage IV cancer. 336-342-7796 1-800-772-1213 ? [place] Aging, Disability and Transit Services: Assists with nutrition, care and transit needs. 336-349-2343  Cancer Center Support Programs: @10RELATIVEDAYS@ > Cancer Support Group  2nd Tuesday of the month 1pm-2pm, Journey Room  > Creative Journey  3rd Tuesday of the month 1130am-1pm, Journey Room  > Look Good Feel Better  1st Wednesday of the month 10am-12 noon, Journey Room (Call American Cancer Society to register 1-800-395-5775)   

## 2015-11-04 ENCOUNTER — Ambulatory Visit: Payer: Medicare Other | Admitting: Gastroenterology

## 2015-11-08 ENCOUNTER — Other Ambulatory Visit: Payer: Self-pay | Admitting: Internal Medicine

## 2015-11-09 ENCOUNTER — Ambulatory Visit (INDEPENDENT_AMBULATORY_CARE_PROVIDER_SITE_OTHER): Payer: Medicare Other

## 2015-11-09 ENCOUNTER — Encounter: Payer: Self-pay | Admitting: Orthopedic Surgery

## 2015-11-09 ENCOUNTER — Ambulatory Visit (INDEPENDENT_AMBULATORY_CARE_PROVIDER_SITE_OTHER): Payer: Medicare Other | Admitting: Orthopedic Surgery

## 2015-11-09 VITALS — BP 142/70 | HR 67 | Ht 64.0 in | Wt 205.0 lb

## 2015-11-09 DIAGNOSIS — S83241A Other tear of medial meniscus, current injury, right knee, initial encounter: Secondary | ICD-10-CM

## 2015-11-09 DIAGNOSIS — M5441 Lumbago with sciatica, right side: Secondary | ICD-10-CM

## 2015-11-09 DIAGNOSIS — M25561 Pain in right knee: Secondary | ICD-10-CM

## 2015-11-09 MED ORDER — HYDROCODONE-ACETAMINOPHEN 5-325 MG PO TABS: 1.0000 | ORAL_TABLET | Freq: Four times a day (QID) | ORAL | 0 refills | Status: DC | PRN

## 2015-11-09 NOTE — Progress Notes (Signed)
Chief Complaint  Patient presents with  . 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  Cardiovascular: Positive for leg swelling.  Gastrointestinal: Positive for blood in stool.  Musculoskeletal: Positive for back pain and joint pain.  Neurological: Positive for tingling and sensory change.  Endo/Heme/Allergies: Positive for environmental allergies.    Past Medical History:  Diagnosis Date  . Adenomatous colon polyp   . Allergic rhinitis   . Arthritis   . Diabetes mellitus   . Diverticulosis   . External hemorrhoids   . Fibromyalgia   . GERD (gastroesophageal reflux disease)   . Glaucoma   . Hemorrhoids    grade 3   . IDA (iron deficiency anemia)   . Low tension glaucoma   . Neuropathy, leg   . Ovarian cyst 11/20/00  . Sleep apnea    CPAP  . Sliding hiatal hernia     Past Surgical History:  Procedure Laterality Date  . CARPAL TUNNEL RELEASE Bilateral   . CATARACT EXTRACTION     bilateral  . CHOLECYSTECTOMY    . COLONOSCOPY  2005   Small external hemorrhoids, single sigmoid colon diverticulum, small polyp at the hepatic flexure focally adenomatous.  . COLONOSCOPY  2010   Dr. Gala Romney, Versed 4/Demerol 75. Pancolonic diverticula, external hemorrhoids. Colonoscopy every 5 years for history of adenomatous polyps  . COLONOSCOPY  2014   Dr. Olevia Perches: Propofol. mild diverticulosis in the sigmoid colon, random colon biopsies benign.  . COLONOSCOPY WITH PROPOFOL N/A 09/13/2015   Procedure: COLONOSCOPY WITH PROPOFOL;   Surgeon: Daneil Dolin, MD;  [facility];  Service: Endoscopy;  Laterality: N/A;  1030  . ESOPHAGEAL DILATION N/A 10/26/2014   Procedure: ESOPHAGEAL DILATION [place];  Surgeon: Daneil Dolin, MD;  Location: [facility];  Service: Endoscopy;  Laterality: N/A;  . ESOPHAGOGASTRODUODENOSCOPY  2010   Dr. Gala Romney, distal esophageal erosions, Venia Minks dilator passed. Comment small hiatal hernia, patulous EG junction.  . ESOPHAGOGASTRODUODENOSCOPY (EGD) WITH PROPOFOL N/A 10/26/2014   Dr.Rourk- distal esophageal erosions c/w mild erosive reflux esophagitis. hiatal hernia, o/w normal EGD  . ESOPHAGOGASTRODUODENOSCOPY (EGD) WITH PROPOFOL N/A 09/13/2015   Procedure: ESOPHAGOGASTRODUODENOSCOPY (EGD) WITH PROPOFOL;  Surgeon: Daneil Dolin, MD;  [facility];  Service: Endoscopy;  Laterality: N/A;  . GIVENS CAPSULE STUDY N/A 10/26/2014   couple of mucosal breaks/erosions but essentially unremarkable small bowel study  . HEMORRHOID BANDING    . KNEE SURGERY     right-arthroscopy  . OOPHORECTOMY  age 57s  . PARTIAL HYSTERECTOMY  age 46s  . RECTAL EXAM UNDER ANESTHESIA N/A 10/26/2014   circumferential grade 3 hemoorrhoids easily reducible  . thumb surger Bilateral    trigger thumb   Family History  Problem Relation Age of Onset  . Ulcers Mother   . Glaucoma Mother   . Heart disease Mother   . Colon cancer Neg Hx   . Glaucoma Maternal Grandmother   . Heart disease Maternal Grandmother   . Heart disease Maternal  Grandfather    Social History  Substance Use Topics  . Smoking status: Never Smoker  . Smokeless tobacco: Never Used     Comment: Pt reports tried once in 7th grade//ms  . Alcohol use 0.6 oz/week    1 Glasses of wine per week     Comment: rare   No outpatient prescriptions have been marked as taking for the 11/09/15 encounter (Office Visit) with Carole Civil, MD.    BP (!) 142/70   Pulse 67   Ht 5\' 4"  (1.626 m)   Wt 205 lb (93 kg)   BMI 35.19 kg/m   Physical  Exam  Constitutional: She is oriented to person, place, and time. She appears well-developed and well-nourished. No distress.  HENT:  Head: Normocephalic and atraumatic.  Eyes: Conjunctivae and EOM are normal. Right eye exhibits no discharge. Left eye exhibits no discharge. No scleral icterus.  Cardiovascular: Intact distal pulses.   Peripheral edema both legs   Musculoskeletal:       Right knee: She exhibits effusion.       Left knee: She exhibits no effusion.  Altered gait "dragging" right leg along;  Right lower back tenderness    Neurological: She is alert and oriented to person, place, and time.  Skin: Skin is warm and dry. She is not diaphoretic.  Psychiatric: She has a normal mood and affect. Her behavior is normal. Judgment and thought content normal.    Right Knee Exam   Tenderness  The patient is experiencing tenderness in the medial joint line, lateral retinaculum, medial retinaculum and patella.  Range of Motion  Extension: 0  Right knee flexion: 125.   Muscle Strength   The patient has normal right knee strength.  Tests  [place]:  Anterior - negative     Drawer:       Anterior - negative    Posterior - negative  Other  Erythema: absent Scars: absent Sensation: normal Swelling: none Other tests: effusion present  Comments:  Mild effusion    Left Knee Exam   Tenderness  The patient is experiencing no tenderness.     Range of Motion  The patient has normal left knee ROM.  Muscle Strength   The patient has normal left knee strength.  Tests  Drawer:       Anterior - negative     Posterior - negative  Other  Erythema: absent Sensation: normal Pulse: present Swelling: none Effusion: no effusion present       ASSESSMENT: My personal interpretation of the images:  L SPINE mild arthritis lower 2 segments lumbar spine RIGHT KNEE severe valgus arthritis right knee   Encounter Diagnoses  Name Primary?  . Right knee pain   .  Right-sided low back pain with right-sided sciatica   . Medial meniscus tear, right, initial encounter Yes    PLAN MRI 30 tablets of Norco 5 mg for pain  Arther Abbott, MD 11/09/2015 1:50 PM  .med

## 2015-11-16 ENCOUNTER — Ambulatory Visit (HOSPITAL_COMMUNITY)
Admission: RE | Admit: 2015-11-16 | Discharge: 2015-11-16 | Disposition: A | Discharge status: Home / Self Care | Payer: Medicare Other | Source: Ambulatory Visit | Attending: Orthopedic Surgery | Admitting: Orthopedic Surgery

## 2015-11-16 DIAGNOSIS — S83271A Complex tear of lateral meniscus, current injury, right knee, initial encounter: Secondary | ICD-10-CM | POA: Insufficient documentation

## 2015-11-16 DIAGNOSIS — M1711 Unilateral primary osteoarthritis, right knee: Secondary | ICD-10-CM | POA: Insufficient documentation

## 2015-11-16 DIAGNOSIS — M25461 Effusion, right knee: Secondary | ICD-10-CM | POA: Diagnosis not present

## 2015-11-16 DIAGNOSIS — S83241A Other tear of medial meniscus, current injury, right knee, initial encounter: Secondary | ICD-10-CM

## 2015-11-16 DIAGNOSIS — X58XXXA Exposure to other specified factors, initial encounter: Secondary | ICD-10-CM | POA: Diagnosis not present

## 2015-11-16 DIAGNOSIS — M25561 Pain in right knee: Secondary | ICD-10-CM | POA: Diagnosis not present

## 2015-11-17 ENCOUNTER — Ambulatory Visit (INDEPENDENT_AMBULATORY_CARE_PROVIDER_SITE_OTHER): Payer: Medicare Other | Admitting: Orthopedic Surgery

## 2015-11-17 VITALS — BP 121/74 | HR 59 | Ht 64.0 in | Wt 205.0 lb

## 2015-11-17 DIAGNOSIS — S83281D Other tear of lateral meniscus, current injury, right knee, subsequent encounter: Secondary | ICD-10-CM | POA: Diagnosis not present

## 2015-11-17 DIAGNOSIS — M1711 Unilateral primary osteoarthritis, right knee: Secondary | ICD-10-CM

## 2015-11-17 DIAGNOSIS — M25561 Pain in right knee: Secondary | ICD-10-CM

## 2015-11-17 DIAGNOSIS — M129 Arthropathy, unspecified: Secondary | ICD-10-CM | POA: Diagnosis not present

## 2015-11-17 DIAGNOSIS — M5441 Lumbago with sciatica, right side: Secondary | ICD-10-CM | POA: Diagnosis not present

## 2015-11-17 NOTE — Progress Notes (Signed)
Patient ID: Debra Harris, female   DOB: [birth year], 65 y.o.   MRN: [id number]  Chief Complaint  Patient presents with  . Follow-up    right knee pain here for MRI review    HPI Debra Harris is a 65 y.o. female.   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 appearance is normal well-groomed   Ortho Exam Left knee lateral joint line tenderness and patellofemoral pain and crepitance   Medical decision-making Diagnosis, Data, Plan (risk)  IMPRESSION: Tricompartmental osteoarthritis appears worst laterally where it is severe.   Nonvisualization of the anterior horn of the lateral meniscus consistent with degenerative maceration. The body of the lateral meniscus is extruded peripherally with complex tearing throughout.     Electronically Signed   By: Debra Harris M.D.   On: 11/16/2015 14:53 After review of the MRI agree that she has a severely macerated lateral meniscus with lateral compartment arthrosis she also has chondral thinning of the patellofemoral area  We decided to inject the knee. She will go on vacation in a week or so to [place]. Her pain medicine is working well she is using it sparingly  She is continue with exercises and heat and ice as needed  If the meniscus become symptomatic of course we will recommend arthroscopic surgery  We did  discuss recent literature suggesting that degenerative tears and arthritic compartment that are not symptomatic with mechanical symptoms do not improve with meniscectomy but symptomatic tears do  Follow-up 4 weeks Debra Abbott, MD 11/17/2015 11:48 AM

## 2015-11-17 NOTE — Patient Instructions (Signed)

## 2015-11-18 ENCOUNTER — Encounter (HOSPITAL_COMMUNITY): Payer: Medicare Other | Attending: Hematology & Oncology

## 2015-11-18 DIAGNOSIS — E119 Type 2 diabetes mellitus without complications: Secondary | ICD-10-CM | POA: Diagnosis not present

## 2015-11-18 DIAGNOSIS — K644 Residual hemorrhoidal skin tags: Secondary | ICD-10-CM | POA: Insufficient documentation

## 2015-11-18 DIAGNOSIS — K219 Gastro-esophageal reflux disease without esophagitis: Secondary | ICD-10-CM | POA: Insufficient documentation

## 2015-11-18 DIAGNOSIS — G629 Polyneuropathy, unspecified: Secondary | ICD-10-CM | POA: Insufficient documentation

## 2015-11-18 DIAGNOSIS — K909 Intestinal malabsorption, unspecified: Secondary | ICD-10-CM | POA: Insufficient documentation

## 2015-11-18 DIAGNOSIS — K449 Diaphragmatic hernia without obstruction or gangrene: Secondary | ICD-10-CM | POA: Insufficient documentation

## 2015-11-18 DIAGNOSIS — K5790 Diverticulosis of intestine, part unspecified, without perforation or abscess without bleeding: Secondary | ICD-10-CM | POA: Diagnosis not present

## 2015-11-18 DIAGNOSIS — G4730 Sleep apnea, unspecified: Secondary | ICD-10-CM | POA: Diagnosis not present

## 2015-11-18 DIAGNOSIS — H409 Unspecified glaucoma: Secondary | ICD-10-CM | POA: Insufficient documentation

## 2015-11-18 DIAGNOSIS — D509 Iron deficiency anemia, unspecified: Secondary | ICD-10-CM | POA: Diagnosis not present

## 2015-11-18 DIAGNOSIS — J309 Allergic rhinitis, unspecified: Secondary | ICD-10-CM | POA: Diagnosis not present

## 2015-11-18 DIAGNOSIS — K921 Melena: Secondary | ICD-10-CM

## 2015-11-18 DIAGNOSIS — M797 Fibromyalgia: Secondary | ICD-10-CM | POA: Diagnosis not present

## 2015-11-18 DIAGNOSIS — Z86010 Personal history of colonic polyps: Secondary | ICD-10-CM | POA: Diagnosis not present

## 2015-11-18 DIAGNOSIS — K648 Other hemorrhoids: Secondary | ICD-10-CM | POA: Diagnosis not present

## 2015-11-18 LAB — CBC WITH DIFFERENTIAL/PLATELET
BASOS ABS: 0 10*3/uL (ref 0.0–0.1)
BASOS PCT: 0 %
EOS PCT: 1 %
Eosinophils Absolute: 0.1 10*3/uL (ref 0.0–0.7)
HCT: 37.9 % (ref 36.0–46.0)
Hemoglobin: 11.8 g/dL — ABNORMAL LOW (ref 12.0–15.0)
Lymphocytes Relative: 22 %
Lymphs Abs: 2.5 10*3/uL (ref 0.7–4.0)
MCH: 24.8 pg — AB (ref 26.0–34.0)
MCHC: 31.1 g/dL (ref 30.0–36.0)
MCV: 79.8 fL (ref 78.0–100.0)
MONO ABS: 0.8 10*3/uL (ref 0.1–1.0)
Monocytes Relative: 8 %
NEUTROS ABS: 7.7 10*3/uL (ref 1.7–7.7)
Neutrophils Relative %: 69 %
Platelets: 269 10*3/uL (ref 150–400)
RBC: 4.75 MIL/uL (ref 3.87–5.11)
RDW: 19.7 % — ABNORMAL HIGH (ref 11.5–15.5)
WBC: 11.1 10*3/uL — AB (ref 4.0–10.5)

## 2015-11-18 LAB — FERRITIN: FERRITIN: 60 ng/mL (ref 11–307)

## 2015-11-22 DIAGNOSIS — M545 Low back pain: Secondary | ICD-10-CM | POA: Diagnosis not present

## 2015-11-22 DIAGNOSIS — M9903 Segmental and somatic dysfunction of lumbar region: Secondary | ICD-10-CM | POA: Diagnosis not present

## 2015-11-23 ENCOUNTER — Encounter (HOSPITAL_COMMUNITY): Payer: Self-pay | Admitting: Oncology

## 2015-11-23 ENCOUNTER — Ambulatory Visit (INDEPENDENT_AMBULATORY_CARE_PROVIDER_SITE_OTHER): Payer: Medicare Other

## 2015-11-23 ENCOUNTER — Encounter: Payer: Self-pay | Admitting: Orthopedic Surgery

## 2015-11-23 ENCOUNTER — Encounter (HOSPITAL_BASED_OUTPATIENT_CLINIC_OR_DEPARTMENT_OTHER): Payer: Medicare Other | Admitting: Oncology

## 2015-11-23 ENCOUNTER — Ambulatory Visit (INDEPENDENT_AMBULATORY_CARE_PROVIDER_SITE_OTHER): Payer: Medicare Other | Admitting: Orthopedic Surgery

## 2015-11-23 VITALS — BP 104/67 | HR 64 | Ht 64.0 in | Wt 203.2 lb

## 2015-11-23 DIAGNOSIS — M25561 Pain in right knee: Secondary | ICD-10-CM | POA: Diagnosis not present

## 2015-11-23 DIAGNOSIS — M1712 Unilateral primary osteoarthritis, left knee: Secondary | ICD-10-CM

## 2015-11-23 DIAGNOSIS — M25562 Pain in left knee: Secondary | ICD-10-CM | POA: Diagnosis not present

## 2015-11-23 DIAGNOSIS — K909 Intestinal malabsorption, unspecified: Secondary | ICD-10-CM | POA: Diagnosis not present

## 2015-11-23 DIAGNOSIS — S83241A Other tear of medial meniscus, current injury, right knee, initial encounter: Secondary | ICD-10-CM | POA: Diagnosis not present

## 2015-11-23 DIAGNOSIS — M9903 Segmental and somatic dysfunction of lumbar region: Secondary | ICD-10-CM | POA: Diagnosis not present

## 2015-11-23 DIAGNOSIS — D509 Iron deficiency anemia, unspecified: Secondary | ICD-10-CM

## 2015-11-23 DIAGNOSIS — M23322 Other meniscus derangements, posterior horn of medial meniscus, left knee: Secondary | ICD-10-CM

## 2015-11-23 DIAGNOSIS — M545 Low back pain: Secondary | ICD-10-CM | POA: Diagnosis not present

## 2015-11-23 MED ORDER — HYDROCODONE-ACETAMINOPHEN 5-325 MG PO TABS: 1.0000 | ORAL_TABLET | Freq: Four times a day (QID) | ORAL | 0 refills | Status: DC | PRN

## 2015-11-23 NOTE — Patient Instructions (Signed)
[place] at [facility] Discharge Instructions  RECOMMENDATIONS MADE BY THE CONSULTANT AND ANY TEST RESULTS WILL BE SENT TO YOUR REFERRING PHYSICIAN.  You were seen today by Kirby Crigler PA-C.  Feraheme infusion this week.  Return in 6 weeks for labs  Return in 6 months for follow up.   Thank you for choosing [place] at [facility] to provide your oncology and hematology care.  To afford each patient quality time with our provider, please arrive at least 15 minutes before your scheduled appointment time.   Beginning January 23rd 2017 lab work for the [facility] will be done in the  Main lab at [facility] on 1st floor. If you have a lab appointment with the [facility] please come in thru the  Main Entrance and check in at the main information desk  You need to re-schedule your appointment should you arrive 10 or more minutes late.  We strive to give you quality time with our providers, and arriving late affects you and other patients whose appointments are after yours.  Also, if you no show three or more times for appointments you may be dismissed from the clinic at the providers discretion.     Again, thank you for choosing [facility].  Our hope is that these requests will decrease the amount of time that you wait before being seen by our physicians.       _____________________________________________________________  Should you have questions after your visit to [facility], please contact our office at [phone] between the hours of 8:30 a.m. and 4:30 p.m.  Voicemails left after 4:30 p.m. will not be returned until the following business day.  For prescription refill requests, have your pharmacy contact our office.         Resources For Cancer Patients and their Caregivers ? American Cancer Society: Can assist with transportation, wigs, general needs, runs Look Good Feel Better.         [phone] ? Cancer Care: Provides financial assistance, online support groups, medication/co-pay assistance.  1-800-813-HOPE 314-214-6491) ? [place] Assists [place] cancer patients and their families through emotional , educational and financial support.  ([phone] ? [place] DSS Where to apply for food stamps, Medicaid and utility assistance. 931-193-6409 ? RCATS: Transportation to medical appointments. 3321057085 ? Social Security Administration: May apply for disability if have a Stage IV cancer. 618-173-8492 7745277511 ? [employer], Disability and Transit Services: Assists with nutrition, care and transit needs. [place] Support Programs: @10RELATIVEDAYS @ > Cancer Support Group  2nd Tuesday of the month 1pm-2pm, Journey Room  > Creative Journey  3rd Tuesday of the month 1130am-1pm, Journey Room  > Look Good Feel Better  1st Wednesday of the month 10am-12 noon, Journey Room (Call [place] to register [phone])

## 2015-11-23 NOTE — Progress Notes (Signed)
Patient ID: Debra Harris, female   DOB: 02-Mar-1950, 65 y.o.   MRN: [id number]  Chief Complaint  Patient presents with  . Knee Pain    Left knee pain    HPI Debra Harris is a 65 y.o. female.  Recently seen for pain in her right knee after falling at school guided injection was improved in terms of oral pain but then twisted her left knee about 2 days ago complains of suprapatellar pain and medial pain which is constant sharp stabbing associated with some giving way. Comes in for evaluation and treatment.  Review of Systems Review of Systems  Constitutional: Positive for fatigue.  Cardiovascular: Positive for leg swelling.    Past Medical History:  Diagnosis Date  . Adenomatous colon polyp   . Allergic rhinitis   . Arthritis   . Diabetes mellitus   . Diverticulosis   . External hemorrhoids   . Fibromyalgia   . GERD (gastroesophageal reflux disease)   . Glaucoma   . Hemorrhoids    grade 3   . IDA (iron deficiency anemia)   . Iron deficiency anemia 06/05/2014  . Low tension glaucoma   . Neuropathy, leg   . Ovarian cyst 11/20/00  . Sleep apnea    CPAP  . Sliding hiatal hernia     Past Surgical History:  Procedure Laterality Date  . CARPAL TUNNEL RELEASE Bilateral   . CATARACT EXTRACTION     bilateral  . CHOLECYSTECTOMY    . COLONOSCOPY  2005   Small external hemorrhoids, single sigmoid colon diverticulum, small polyp at the hepatic flexure focally adenomatous.  . COLONOSCOPY  2010   Dr. Gala Romney, Versed 4/Demerol 75. Pancolonic diverticula, external hemorrhoids. Colonoscopy every 5 years for history of adenomatous polyps  . COLONOSCOPY  2014   Dr. Olevia Perches: Propofol. mild diverticulosis in the sigmoid colon, random colon biopsies benign.  . COLONOSCOPY WITH PROPOFOL N/A 09/13/2015   Procedure: COLONOSCOPY WITH PROPOFOL;  Surgeon: Daneil Dolin, MD;  [facility];  Service: Endoscopy;  Laterality: N/A;  1030  . ESOPHAGEAL DILATION N/A 10/26/2014   Procedure:  ESOPHAGEAL DILATION [place];  Surgeon: Daneil Dolin, MD;  Location: [facility] ORS;  Service: Endoscopy;  Laterality: N/A;  . ESOPHAGOGASTRODUODENOSCOPY  2010   Dr. Gala Romney, distal esophageal erosions, Venia Minks dilator passed. Comment small hiatal hernia, patulous EG junction.  . ESOPHAGOGASTRODUODENOSCOPY (EGD) WITH PROPOFOL N/A 10/26/2014   Dr.Rourk- distal esophageal erosions c/w mild erosive reflux esophagitis. hiatal hernia, o/w normal EGD  . ESOPHAGOGASTRODUODENOSCOPY (EGD) WITH PROPOFOL N/A 09/13/2015   Procedure: ESOPHAGOGASTRODUODENOSCOPY (EGD) WITH PROPOFOL;  Surgeon: Daneil Dolin, MD;  [facility];  Service: Endoscopy;  Laterality: N/A;  . GIVENS CAPSULE STUDY N/A 10/26/2014   couple of mucosal breaks/erosions but essentially unremarkable small bowel study  . HEMORRHOID BANDING    . KNEE SURGERY     right-arthroscopy  . OOPHORECTOMY  age 77s  . PARTIAL HYSTERECTOMY  age 89s  . RECTAL EXAM UNDER ANESTHESIA N/A 10/26/2014   circumferential grade 3 hemoorrhoids easily reducible  . thumb surger Bilateral    trigger thumb    Social History Social History  Substance Use Topics  . Smoking status: Never Smoker  . Smokeless tobacco: Never Used     Comment: Pt reports tried once in 7th grade//ms  . Alcohol use 0.6 oz/week    1 Glasses of wine per week     Comment: rare    No Known Allergies  No outpatient prescriptions  have been marked as taking for the 11/23/15 encounter (Office Visit) with Carole Civil, MD.      Physical Exam Physical Exam BP 104/67   Pulse 64   Ht 5\' 4"  (1.626 m)   Wt 203 lb 3.2 oz (92.2 kg)   BMI 34.88 kg/m   Gen. appearance. The patient is well-developed and well-nourished, grooming and hygiene are normal. There are no gross congenital abnormalities  The patient is alert and oriented to person place and time  Mood and affect are normal  Ambulation Slight limp  Examination reveals the following: On inspection we find medial joint  line tenderness and tenderness at the superior lateral patella  With the range of motion of  125  Stability tests were normal  for the collateral ligaments and cruciate ligaments  Strength tests revealed grade 5 motor strength  Skin we find no rash ulceration or erythema  Sensation remains intact  Impression vascular system shows no peripheral edema  Data Reviewed Plain films show valgus alignment small effusion moderate where slightly asymmetric lateral side dominating with mild patellofemoral arthritis as well osteoarthritis with possible torn medial meniscus  Assessment    Encounter Diagnoses  Name Primary?  . Left knee pain Yes  . Right knee pain   . Medial meniscus tear, right, initial encounter   . Medial meniscus, posterior horn derangement, left   . Primary osteoarthritis of left knee        Plan    Injection left knee  Meds ordered this encounter  Medications  . HYDROcodone-acetaminophen (NORCO) 5-325 MG tablet    Sig: Take 1 tablet by mouth every 6 (six) hours as needed for moderate pain.    Dispense:  30 tablet    Refill:  0          Arther Abbott 11/23/2015, 2:23 PM

## 2015-11-23 NOTE — Progress Notes (Signed)
Debra Noble, MD [address] [name] [address]  Iron deficiency anemia  CURRENT THERAPY: IV iron replacement when indicated.  INTERVAL HISTORY: Debra Harris 65 y.o. female returns for followup of Iron deficiency anemia secondary to iron malabsorption and chronic GI blood loss from internal hemorrhoids requiring IV iron replacement when indicated.  Last EGD/Colonoscopy by Dr. Gala Romney was on 09/13/2015: - Diverticulosis in the sigmoid colon, in the descending colon and in the transverse colon. - Non-bleeding prolapsed external and internal hemorrhoids. - The examination was otherwise normal on direct and retroflexion views. - The examined portion of the ileum was normal. - No specimens collected. I suspect rectal bleeding emanating from hemorrhoids. An element of iron malabsorption is not excluded. Celiac panel previously negative. Capsule study demonstrated only minimal erosions possibly NSAID related. EGD findings today as noted. Patient has had a CRH banding by me and topical hemorrhoidal therapy performed Dr. Romona Curls for previously. Patient has residual significant hemorrhoid disease. This time, I feel that she would be best served by surgical referral for definitive hemorrhoidectomy. Normal esophagus. Dilated. - Small hiatal hernia. - The examination was otherwise normal. - Normal duodenal bulb and second portion of the duodenum. - No specimens collected.  She denies any recent blood loss in stool or dark stool.  "I think I have that under control now."  She is going to [facility] with her granchild.  Review of Systems  Constitutional: Negative for chills, fever and weight loss.  HENT: Negative.   Eyes: Negative.   Respiratory: Negative.   Cardiovascular: Negative.   Gastrointestinal: Negative.  Negative for blood in stool and melena.  Genitourinary: Negative.  Negative for dysuria and hematuria.  Musculoskeletal: Negative.   Skin: Negative.     Neurological: Negative.  Negative for weakness.  Endo/Heme/Allergies: Negative.   Psychiatric/Behavioral: Negative.     Past Medical History:  Diagnosis Date  . Adenomatous colon polyp   . Allergic rhinitis   . Arthritis   . Diabetes mellitus   . Diverticulosis   . External hemorrhoids   . Fibromyalgia   . GERD (gastroesophageal reflux disease)   . Glaucoma   . Hemorrhoids    grade 3   . IDA (iron deficiency anemia)   . Iron deficiency anemia 06/05/2014  . Low tension glaucoma   . Neuropathy, leg   . Ovarian cyst 11/20/00  . Sleep apnea    CPAP  . Sliding hiatal hernia     Past Surgical History:  Procedure Laterality Date  . CARPAL TUNNEL RELEASE Bilateral   . CATARACT EXTRACTION     bilateral  . CHOLECYSTECTOMY    . COLONOSCOPY  2005   Small external hemorrhoids, single sigmoid colon diverticulum, small polyp at the hepatic flexure focally adenomatous.  . COLONOSCOPY  2010   Dr. Gala Romney, Versed 4/Demerol 75. Pancolonic diverticula, external hemorrhoids. Colonoscopy every 5 years for history of adenomatous polyps  . COLONOSCOPY  2014   Dr. Olevia Perches: Propofol. mild diverticulosis in the sigmoid colon, random colon biopsies benign.  . COLONOSCOPY WITH PROPOFOL N/A 09/13/2015   Procedure: COLONOSCOPY WITH PROPOFOL;  Surgeon: Daneil Dolin, MD;  [facility];  Service: Endoscopy;  Laterality: N/A;  1030  . ESOPHAGEAL DILATION N/A 10/26/2014   Procedure: ESOPHAGEAL DILATION [place];  Surgeon: Daneil Dolin, MD;  Location: [facility]S;  Service: Endoscopy;  Laterality: N/A;  . ESOPHAGOGASTRODUODENOSCOPY  2010   Dr. Gala Romney, distal esophageal erosions, Venia Minks dilator passed. Comment small  hiatal hernia, patulous EG junction.  . ESOPHAGOGASTRODUODENOSCOPY (EGD) WITH PROPOFOL N/A 10/26/2014   Dr.Rourk- distal esophageal erosions c/w mild erosive reflux esophagitis. hiatal hernia, o/w normal EGD  . ESOPHAGOGASTRODUODENOSCOPY (EGD) WITH PROPOFOL N/A 09/13/2015   Procedure:  ESOPHAGOGASTRODUODENOSCOPY (EGD) WITH PROPOFOL;  Surgeon: Daneil Dolin, MD;  [facility];  Service: Endoscopy;  Laterality: N/A;  . GIVENS CAPSULE STUDY N/A 10/26/2014   couple of mucosal breaks/erosions but essentially unremarkable small bowel study  . HEMORRHOID BANDING    . KNEE SURGERY     right-arthroscopy  . OOPHORECTOMY  age 83s  . PARTIAL HYSTERECTOMY  age 26s  . RECTAL EXAM UNDER ANESTHESIA N/A 10/26/2014   circumferential grade 3 hemoorrhoids easily reducible  . thumb surger Bilateral    trigger thumb    Family History  Problem Relation Age of Onset  . Ulcers Mother   . Glaucoma Mother   . Heart disease Mother   . Colon cancer Neg Hx   . Glaucoma Maternal Grandmother   . Heart disease Maternal Grandmother   . Heart disease Maternal Grandfather     Social History   Social History  . Marital status: Married    Spouse name: J. Orland Dec  . Number of children: Y  . Years of education: N/A   Occupational History  . AUDIOLOGIST    Social History Main Topics  . Smoking status: Never Smoker  . Smokeless tobacco: Never Used     Comment: Pt reports tried once in 7th grade//ms  . Alcohol use 0.6 oz/week    1 Glasses of wine per week     Comment: rare  . Drug use: No  . Sexual activity: No   Other Topics Concern  . None   Social History Narrative   3 children & married to orthopedist. 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        PHYSICAL EXAMINATION  ECOG PERFORMANCE STATUS: 0 - Asymptomatic  Vitals:   11/23/15 0910  BP: (!) 157/66  Pulse: 69  Resp: 18  Temp: 98.5 F (36.9 C)    GENERAL:alert, no distress, well nourished, well developed, comfortable, cooperative, smiling and unaccompanied SKIN: skin color, texture, turgor are normal, no rashes or significant lesions HEAD: Normocephalic, No masses, lesions, tenderness or abnormalities EYES: normal, EOMI, Conjunctiva are pink and non-injected EARS: External ears  normal OROPHARYNX:mucous membranes are moist  NECK: supple, trachea midline LYMPH:  not examined BREAST:not examined LUNGS: clear to auscultation  HEART: regular rate & rhythm ABDOMEN:non-tender and normal bowel sounds BACK: Back symmetric, no curvature. EXTREMITIES:less then 2 second capillary refill, no joint deformities, effusion, or inflammation, no skin discoloration, no cyanosis  NEURO: alert & oriented x 3 with fluent speech, no focal motor/sensory deficits, gait normal   LABORATORY DATA: CBC    Component Value Date/Time   WBC 11.1 (H) 11/18/2015 0835   RBC 4.75 11/18/2015 0835   HGB 11.8 (L) 11/18/2015 0835   HCT 37.9 11/18/2015 0835   PLT 269 11/18/2015 0835   MCV 79.8 11/18/2015 0835   MCH 24.8 (L) 11/18/2015 0835   MCHC 31.1 11/18/2015 0835   RDW 19.7 (H) 11/18/2015 0835   LYMPHSABS 2.5 11/18/2015 0835   MONOABS 0.8 11/18/2015 0835   EOSABS 0.1 11/18/2015 0835   BASOSABS 0.0 11/18/2015 0835      Chemistry      Component Value Date/Time   NA 137 09/10/2015 0847   K 3.9 09/10/2015 0847   CL 106 09/10/2015 0847  CO2 25 09/10/2015 0847   BUN 11 09/10/2015 0847   CREATININE 0.57 09/10/2015 0847      Component Value Date/Time   CALCIUM 9.0 09/10/2015 0847   ALKPHOS 70 10/09/2014 1435   AST 35 10/09/2014 1435   ALT 38 10/09/2014 1435   BILITOT 0.6 10/09/2014 1435        PENDING LABS:   RADIOGRAPHIC STUDIES:  Dg Lumbar Spine 2-3 Views  Result Date: 11/09/2015 Lumbar spine 3 views Right lower back pain with some right leg and knee pain Mild coronal plane malalignment. T12-L1 disc space narrowing mild loss of normal lumbar lordosis possible spondylolysis at L5 Facet joint arthritis at L4 and 5 and L5-S1 Impression mild spondylosis  Mr Knee Right Wo Contrast  Result Date: 11/16/2015 CLINICAL DATA:  Status post fall downstairs in August, 2017 with a right knee injury. Continued pain. EXAM: MRI OF THE RIGHT KNEE WITHOUT CONTRAST TECHNIQUE: Multiplanar,  multisequence MR imaging of the knee was performed. No intravenous contrast was administered. COMPARISON:  Plain films right knee 11/09/2015. FINDINGS: MENISCI Medial meniscus: Degenerative signal is seen in the posterior horn but there is no tear. Lateral meniscus: The anterior horn is not visualized consistent with degenerative maceration. Complex tearing is seen throughout the body which is extruded peripherally out of the joint. LIGAMENTS Cruciates: Intact with some mucoid degeneration of both ligaments identified. Collaterals:  Intact. CARTILAGE Patellofemoral:  Mildly thinned and irregular throughout. Medial: Thinning and irregularity are most notable along the weight-bearing surface of the medial femoral condyle. Lateral: Appears completely denuded with associated joint space narrowing. Joint:  Small joint effusion. Popliteal Fossa:  No Baker's cyst. Extensor Mechanism:  Intact. Bones: No fracture or worrisome marrow lesion. Bulky tricompartmental osteophytosis is worst laterally. Subchondral edema is present about the lateral compartment. Other: None. IMPRESSION: Tricompartmental osteoarthritis appears worst laterally where it is severe. Nonvisualization of the anterior horn of the lateral meniscus consistent with degenerative maceration. The body of the lateral meniscus is extruded peripherally with complex tearing throughout. Electronically Signed   By: Inge Rise M.D.   On: 11/16/2015 14:53   Dg Knee Ap/lat W/sunrise Right  Result Date: 11/09/2015 3 views right knee status post fall Mild effusion right knee Lateral compartment shows no joint space mild spurs mild sclerosis. Valgus alignment to the knee tibiofemoral angle is 16 We see some mild spurs at the posterior patellar region near the femoral articulation as well as an enthesophyte at the quadriceps insertion Spur noted lateral patellar facet and lateral femoral condyle as well as medial femoral condyle Impression primarily lateral  compartment arthrosis severe with no evidence of fracture     PATHOLOGY:    ASSESSMENT AND PLAN:  Iron deficiency anemia Iron deficiency anemia secondary to iron malabsorption and chronic GI blood loss from internal hemorrhoids requiring IV iron replacement when indicated.  Oncology Flowsheet 10/26/2015  ferumoxytol [facility]) IV 510 mg   Labs on 11/18/2015: CBC diff, ferritin.  I personally reviewed and went over laboratory results with the patient.  The results are noted within this dictation.  Ferritin is less than 100 and HGB is still mildly anemic.  Calculated iron deficit is ~ 440 mg.  As a result, we will set her up for another dose of Feraheme 510 mg.   Supportive therapy plan is built.  She is leaving for [facility] on Thursday for a trip to [place].  Her flight leaves over the weekend.  We will try to get her set-up for IV iron infusion tomorrow  or Thursday AM.  Continue with labs every 6 weeks: CBC diff, ferritin.  Return in ~ 6 months for follow-up.    ORDERS PLACED FOR THIS ENCOUNTER: No orders of the defined types were placed in this encounter.   MEDICATIONS PRESCRIBED THIS ENCOUNTER: Meds ordered this encounter  Medications  . naproxen sodium (ANAPROX) 220 MG tablet    Sig: Take 220 mg by mouth.    THERAPY PLAN:  Ongoing observation of blood counts and iron studies with replacement when indicated.  All questions were answered. The patient knows to call the clinic with any problems, questions or concerns. We can certainly see the patient much sooner if necessary.  Patient and plan discussed with Dr. Ancil Linsey and she is in agreement with the aforementioned.   This note is electronically signed by: Robynn Pane, PA-C 11/23/2015 10:00 AM

## 2015-11-23 NOTE — Patient Instructions (Signed)

## 2015-11-23 NOTE — Assessment & Plan Note (Addendum)
Iron deficiency anemia secondary to iron malabsorption and chronic GI blood loss from internal hemorrhoids requiring IV iron replacement when indicated.  Oncology Flowsheet 10/26/2015  ferumoxytol [facility]) IV 510 mg   Labs on 11/18/2015: CBC diff, ferritin.  I personally reviewed and went over laboratory results with the patient.  The results are noted within this dictation.  Ferritin is less than 100 and HGB is still mildly anemic.  Calculated iron deficit is ~ 440 mg.  As a result, we will set her up for another dose of Feraheme 510 mg.   Supportive therapy plan is built.  She is leaving for [facility] on Thursday for a trip to [place].  Her flight leaves over the weekend.  We will try to get her set-up for IV iron infusion tomorrow or Thursday AM.  Continue with labs every 6 weeks: CBC diff, ferritin.  Return in ~ 6 months for follow-up.

## 2015-11-24 ENCOUNTER — Encounter (HOSPITAL_BASED_OUTPATIENT_CLINIC_OR_DEPARTMENT_OTHER): Payer: Medicare Other

## 2015-11-24 VITALS — BP 118/62 | HR 70 | Temp 98.5°F | Resp 18

## 2015-11-24 DIAGNOSIS — D509 Iron deficiency anemia, unspecified: Secondary | ICD-10-CM | POA: Diagnosis not present

## 2015-11-24 DIAGNOSIS — M545 Low back pain: Secondary | ICD-10-CM | POA: Diagnosis not present

## 2015-11-24 DIAGNOSIS — K921 Melena: Secondary | ICD-10-CM

## 2015-11-24 DIAGNOSIS — M9903 Segmental and somatic dysfunction of lumbar region: Secondary | ICD-10-CM | POA: Diagnosis not present

## 2015-11-24 MED ORDER — SODIUM CHLORIDE 0.9 % IV SOLN
Freq: Once | INTRAVENOUS | Status: AC
  Administered 2015-11-24: 14:00:00 via INTRAVENOUS

## 2015-11-24 MED ORDER — SODIUM CHLORIDE 0.9 % IV SOLN
510.0000 mg | Freq: Once | INTRAVENOUS | Status: AC
  Administered 2015-11-24: 510 mg via INTRAVENOUS
  Filled 2015-11-24: qty 17

## 2015-11-24 NOTE — Progress Notes (Signed)
Tolerated iron infusion well. Ambulatory on discharge home to self. 

## 2015-11-24 NOTE — Patient Instructions (Signed)
[place] at [facility] Discharge Instructions  RECOMMENDATIONS MADE BY THE CONSULTANT AND ANY TEST RESULTS WILL BE SENT TO YOUR REFERRING PHYSICIAN.  Feraheme 510 mg iron infusion given today as ordered.  Thank you for choosing [place] at [facility] to provide your oncology and hematology care.  To afford each patient quality time with our provider, please arrive at least 15 minutes before your scheduled appointment time.   Beginning January 23rd 2017 lab work for the [facility] will be done in the  Main lab at [facility] on 1st floor. If you have a lab appointment with the [facility] please come in thru the  Main Entrance and check in at the main information desk  You need to re-schedule your appointment should you arrive 10 or more minutes late.  We strive to give you quality time with our providers, and arriving late affects you and other patients whose appointments are after yours.  Also, if you no show three or more times for appointments you may be dismissed from the clinic at the providers discretion.     Again, thank you for choosing [facility].  Our hope is that these requests will decrease the amount of time that you wait before being seen by our physicians.       _____________________________________________________________  Should you have questions after your visit to [facility], please contact our office at [phone] between the hours of 8:30 a.m. and 4:30 p.m.  Voicemails left after 4:30 p.m. will not be returned until the following business day.  For prescription refill requests, have your pharmacy contact our office.         Resources For Cancer Patients and their Caregivers ? American Cancer Society: Can assist with transportation, wigs, general needs, runs Look Good Feel Better.        858-786-8682 ? Cancer Care: Provides financial assistance, online support groups,  medication/co-pay assistance.  1-800-813-HOPE 913-070-7626) ? [place] Assists [place] cancer patients and their families through emotional , educational and financial support.  4136023092 ? [place] DSS Where to apply for food stamps, Medicaid and utility assistance. (709)766-5616 ? RCATS: Transportation to medical appointments. 785-049-1225 ? Social Security Administration: May apply for disability if have a Stage IV cancer. ([phone] 063-7815 (717) 757-0733 ? [employer], Disability and Transit Services: Assists with nutrition, care and transit needs. [place] Support Programs: @10RELATIVEDAYS @ > Cancer Support Group  2nd Tuesday of the month 1pm-2pm, Journey Room  > Creative Journey  3rd Tuesday of the month 1130am-1pm, Journey Room  > Look Good Feel Better  1st Wednesday of the month 10am-12 noon, Journey Room (Call [place] to register [phone])

## 2015-12-14 ENCOUNTER — Ambulatory Visit (INDEPENDENT_AMBULATORY_CARE_PROVIDER_SITE_OTHER): Payer: Medicare Other | Admitting: Gastroenterology

## 2015-12-14 ENCOUNTER — Encounter: Payer: Self-pay | Admitting: Gastroenterology

## 2015-12-14 VITALS — BP 139/79 | HR 73 | Temp 97.8°F | Ht 65.0 in | Wt 206.4 lb

## 2015-12-14 DIAGNOSIS — D500 Iron deficiency anemia secondary to blood loss (chronic): Secondary | ICD-10-CM

## 2015-12-14 DIAGNOSIS — K219 Gastro-esophageal reflux disease without esophagitis: Secondary | ICD-10-CM

## 2015-12-14 NOTE — Assessment & Plan Note (Signed)
Clinically she is doing well at this time, she strictly adhering to antireflux measures. She would like to come off pantoprazole. Would like to consider this at this time given potential to be contributing to iron malabsorption. As long as she is able to control her symptoms with anti-reflex measures and as needed antacids, I would go along with her being off of PPI therapy. Office visit as needed.

## 2015-12-14 NOTE — Patient Instructions (Signed)
1. Trial off pantoprazole. If you can control your reflux symptoms through dietary changes, then you can stay off the pantoprazole and use TUMS or Zantac as needed.  2. Return to the office as needed.

## 2015-12-14 NOTE — Progress Notes (Signed)
CC'ED TO PCP 

## 2015-12-14 NOTE — Assessment & Plan Note (Signed)
IDA requiring ongoing iron infusions. Extensive GI evaluation as outlined above. Rectal bleeding is a minimum at this time. Trial off of PPI as outlined above. She'll continue to follow with hematology.

## 2015-12-14 NOTE — Progress Notes (Signed)
Primary Care Physician: Asencion Noble, MD  Primary Gastroenterologist:  Garfield Cornea, MD   Chief Complaint  Patient presents with  . Follow-up    HPI: Debra Harris is a 65 y.o. female here for follow-up. She has a history of rectal bleeding and iron deficiency anemia. She has a history of passing significant amount of blood per rectum related to hemorrhoids. She has had 3 sessions of hemorrhoid banding but did not noted significant improvement in her rectal bleeding. Also difficulty maintaining iron despite iron infusions. When we saw her back in July we updated her colonoscopy. Noted to have multiple small mouth diverticula in the sigmoid, descending, transverse colon. Nonbleeding prolapsed external and internal hemorrhoids. Moderate sized, and grade 3. Dr. Gala Romney suspected ongoing rectal bleeding from hemorrhoids. She was referred to Dr. Arnoldo Morale for consideration of definitive hemorrhoidectomy. Notable patient also had EGD in July empirical esophageal dilation for history of dysphagia but esophagus appeared normal. She has small hiatal hernia but no evidence of erosions or reflux esophagitis. Previous capsule study showed a couple of erosions likely related to diclofenac back in August 2016.  Back in May her ferritin was down to 5. Her infusion prior to that was in January. She subsequently received 2 infusions in June. Her ferritin was up to 160. The following month was back to 43. August her ferritin was down to 5 again. She received 2 additional iron infusions and her postinfusion iron has not been obtained yet.  Patient states over the past 3 months she's had much less bleeding. She is having episodes of couple times per month. No longer bleeding heavily. Usually just a little staining. If she sits for a prolonged period of time she is more prone to have bleeding. She states she saw Dr. Arnoldo Morale. He stopped all of her NSAIDs. Weaning her off of Colace. Did this because he states that she  is at risk of rectal prolapse. Advised Kegel exercises which she is doing. She decreased her Benefiber to twice daily. She states her bowel function is regular. She does not strain. She's been really careful with antireflux measures. She feels better along that respect as well. She would like to come off her pantoprazole. Please note previously she was on Dexilant but she had stopped it due to headaches.    Current Outpatient Prescriptions  Medication Sig Dispense Refill  . cetirizine (ZYRTEC) 10 MG tablet Take 10 mg by mouth daily.     Marland Kitchen HYDROcodone-acetaminophen (NORCO) 5-325 MG tablet Take 1 tablet by mouth every 6 (six) hours as needed for moderate pain. 30 tablet 0  . metFORMIN (GLUCOPHAGE) 500 MG tablet Take 1,000 mg by mouth daily with breakfast.     . pantoprazole (PROTONIX) 40 MG tablet TAKE ONE (1) TABLET EACH DAY 30 tablet 11  . Probiotic Product (TRUNATURE DIGESTIVE PROBIOTIC PO) Take 1 capsule by mouth daily. Reported on 08/20/2015    . TURMERIC PO Take 1 tablet by mouth 2 (two) times daily.    Marland Kitchen UNKNOWN TO PATIENT CPAP machine    . Wheat Dextrin (BENEFIBER PO) Take by mouth.     No current facility-administered medications for this visit.     Allergies as of 12/14/2015  . (No Known Allergies)    ROS:  General: Negative for anorexia, weight loss, fever, chills, fatigue, weakness. ENT: Negative for hoarseness, difficulty swallowing , nasal congestion. CV: Negative for chest pain, angina, palpitations, dyspnea on exertion, peripheral edema.  Respiratory: Negative for dyspnea at rest,  dyspnea on exertion, cough, sputum, wheezing.  GI: See history of present illness. GU:  Negative for dysuria, hematuria, urinary incontinence, urinary frequency, nocturnal urination.  Endo: Negative for unusual weight change.    Physical Examination:   BP 139/79   Pulse 73   Temp 97.8 F (36.6 C) (Oral)   Ht 5\' 5"  (1.651 m)   Wt 206 lb 6.4 oz (93.6 kg)   BMI 34.35 kg/m   General:  Well-nourished, well-developed in no acute distress.  Eyes: No icterus. Mouth: Oropharyngeal mucosa moist and pink , no lesions erythema or exudate. Lungs: Clear to auscultation bilaterally.  Heart: Regular rate and rhythm, no murmurs rubs or gallops.  Abdomen: Bowel sounds are normal, nontender, nondistended, no hepatosplenomegaly or masses, no abdominal bruits or hernia , no rebound or guarding.   Extremities: No lower extremity edema. No clubbing or deformities. Neuro: Alert and oriented x 4   Skin: Warm and dry, no jaundice.   Psych: Alert and cooperative, normal mood and affect.  Labs:  Lab Results  Component Value Date   WBC 11.1 (H) 11/18/2015   HGB 11.8 (L) 11/18/2015   HCT 37.9 11/18/2015   MCV 79.8 11/18/2015   PLT 269 11/18/2015   Lab Results  Component Value Date   IRON 24 (L) 10/20/2015   TIBC 547 (H) 10/20/2015   FERRITIN 60 11/18/2015    Imaging Studies: Mr Knee Right Wo Contrast  Result Date: 11/16/2015 CLINICAL DATA:  Status post fall downstairs in August, 2017 with a right knee injury. Continued pain. EXAM: MRI OF THE RIGHT KNEE WITHOUT CONTRAST TECHNIQUE: Multiplanar, multisequence MR imaging of the knee was performed. No intravenous contrast was administered. COMPARISON:  Plain films right knee 11/09/2015. FINDINGS: MENISCI Medial meniscus: Degenerative signal is seen in the posterior horn but there is no tear. Lateral meniscus: The anterior horn is not visualized consistent with degenerative maceration. Complex tearing is seen throughout the body which is extruded peripherally out of the joint. LIGAMENTS Cruciates: Intact with some mucoid degeneration of both ligaments identified. Collaterals:  Intact. CARTILAGE Patellofemoral:  Mildly thinned and irregular throughout. Medial: Thinning and irregularity are most notable along the weight-bearing surface of the medial femoral condyle. Lateral: Appears completely denuded with associated joint space narrowing. Joint:   Small joint effusion. Popliteal Fossa:  No Baker's cyst. Extensor Mechanism:  Intact. Bones: No fracture or worrisome marrow lesion. Bulky tricompartmental osteophytosis is worst laterally. Subchondral edema is present about the lateral compartment. Other: None. IMPRESSION: Tricompartmental osteoarthritis appears worst laterally where it is severe. Nonvisualization of the anterior horn of the lateral meniscus consistent with degenerative maceration. The body of the lateral meniscus is extruded peripherally with complex tearing throughout. Electronically Signed   By: Inge Rise M.D.   On: 11/16/2015 14:53   Dg Knee Ap/lat W/sunrise Left  Result Date: 11/23/2015 DG KNEE AP/LAT W/ SUNRISE LEFT Twisted left knee , pain Valgus knee Marginal osteophytes Symmetric joint space narrowing moderate Patellofemoral osteoarthritis moderate / secondary changes = osteophytes Impression: moderate OA left knee with tibio femorla valgus

## 2015-12-15 ENCOUNTER — Ambulatory Visit (INDEPENDENT_AMBULATORY_CARE_PROVIDER_SITE_OTHER): Payer: Medicare Other | Admitting: Orthopedic Surgery

## 2015-12-15 ENCOUNTER — Encounter: Payer: Self-pay | Admitting: Orthopedic Surgery

## 2015-12-15 DIAGNOSIS — G8929 Other chronic pain: Secondary | ICD-10-CM | POA: Diagnosis not present

## 2015-12-15 DIAGNOSIS — M25561 Pain in right knee: Secondary | ICD-10-CM

## 2015-12-15 NOTE — Progress Notes (Signed)
Patient ID: Debra Harris, female   DOB: [birth year], 65 y.o.   MRN: [id number]  Chief Complaint  Patient presents with  . Follow-up    left knee    HPI Debra Harris is a 65 y.o. female.   HPI  Status post injection right knee. Patient did well in [place]. Only had to take minimal hydrocodone.  Doing well at this time except sizing on a stationary bike Review of Systems Review of Systems  No mechanical symptoms at this time Physical Exam There were no vitals taken for this visit.   Physical Exam  She is ambulating with no assistive devices and no limp  Her range of motion is now returned to normal. The knee feels stable. She has good quadriceps strength with straight leg extension. No tenderness or swelling  Normal sensation in the right leg  Encounter Diagnosis  Name Primary?  . Chronic pain of right knee Yes    Return as needed

## 2015-12-29 DIAGNOSIS — E119 Type 2 diabetes mellitus without complications: Secondary | ICD-10-CM | POA: Diagnosis not present

## 2015-12-29 DIAGNOSIS — Z79899 Other long term (current) drug therapy: Secondary | ICD-10-CM | POA: Diagnosis not present

## 2016-01-04 ENCOUNTER — Encounter (HOSPITAL_COMMUNITY): Payer: Medicare Other | Attending: Internal Medicine

## 2016-01-06 DIAGNOSIS — E1149 Type 2 diabetes mellitus with other diabetic neurological complication: Secondary | ICD-10-CM | POA: Diagnosis not present

## 2016-01-06 DIAGNOSIS — Z23 Encounter for immunization: Secondary | ICD-10-CM | POA: Diagnosis not present

## 2016-01-17 ENCOUNTER — Telehealth: Payer: Self-pay | Admitting: Orthopedic Surgery

## 2016-01-17 ENCOUNTER — Other Ambulatory Visit: Payer: Self-pay | Admitting: *Deleted

## 2016-01-17 DIAGNOSIS — G8929 Other chronic pain: Secondary | ICD-10-CM

## 2016-01-17 DIAGNOSIS — M25561 Pain in right knee: Principal | ICD-10-CM

## 2016-01-17 MED ORDER — HYDROCODONE-ACETAMINOPHEN 5-325 MG PO TABS: 1.0000 | ORAL_TABLET | Freq: Four times a day (QID) | ORAL | 0 refills | Status: DC | PRN

## 2016-01-17 NOTE — Telephone Encounter (Signed)
OK 

## 2016-01-17 NOTE — Telephone Encounter (Signed)
Patient requests a refill on Hydrocodone/Acetaminophen (Norco)  5-325 mgs.   Qty  30  Sig: Take 1 tablet by mouth every 6 (six) hours as needed for moderate pain.

## 2016-01-17 NOTE — Telephone Encounter (Signed)
Routing to Dr Harrison 

## 2016-01-25 DIAGNOSIS — D72829 Elevated white blood cell count, unspecified: Secondary | ICD-10-CM | POA: Diagnosis not present

## 2016-01-25 DIAGNOSIS — Z124 Encounter for screening for malignant neoplasm of cervix: Secondary | ICD-10-CM | POA: Diagnosis not present

## 2016-01-25 DIAGNOSIS — Z6835 Body mass index (BMI) 35.0-35.9, adult: Secondary | ICD-10-CM | POA: Diagnosis not present

## 2016-01-25 DIAGNOSIS — Z01419 Encounter for gynecological examination (general) (routine) without abnormal findings: Secondary | ICD-10-CM | POA: Diagnosis not present

## 2016-02-15 ENCOUNTER — Encounter (HOSPITAL_COMMUNITY): Payer: Medicare Other | Attending: Hematology & Oncology

## 2016-02-15 DIAGNOSIS — D509 Iron deficiency anemia, unspecified: Secondary | ICD-10-CM | POA: Diagnosis not present

## 2016-02-15 DIAGNOSIS — K921 Melena: Secondary | ICD-10-CM | POA: Insufficient documentation

## 2016-02-15 LAB — CBC WITH DIFFERENTIAL/PLATELET
BASOS ABS: 0.1 10*3/uL (ref 0.0–0.1)
BASOS PCT: 1 %
EOS ABS: 0.6 10*3/uL (ref 0.0–0.7)
EOS PCT: 8 %
HCT: 35.1 % — ABNORMAL LOW (ref 36.0–46.0)
HEMOGLOBIN: 11.3 g/dL — AB (ref 12.0–15.0)
LYMPHS ABS: 1.6 10*3/uL (ref 0.7–4.0)
Lymphocytes Relative: 21 %
MCH: 27.4 pg (ref 26.0–34.0)
MCHC: 32.2 g/dL (ref 30.0–36.0)
MCV: 85.2 fL (ref 78.0–100.0)
Monocytes Absolute: 0.6 10*3/uL (ref 0.1–1.0)
Monocytes Relative: 7 %
NEUTROS PCT: 63 %
Neutro Abs: 4.8 10*3/uL (ref 1.7–7.7)
PLATELETS: 229 10*3/uL (ref 150–400)
RBC: 4.12 MIL/uL (ref 3.87–5.11)
RDW: 15.5 % (ref 11.5–15.5)
WBC: 7.7 10*3/uL (ref 4.0–10.5)

## 2016-02-15 LAB — FERRITIN: FERRITIN: 19 ng/mL (ref 11–307)

## 2016-02-17 ENCOUNTER — Telehealth: Payer: Self-pay | Admitting: Orthopedic Surgery

## 2016-02-17 ENCOUNTER — Other Ambulatory Visit: Payer: Self-pay | Admitting: *Deleted

## 2016-02-17 DIAGNOSIS — G8929 Other chronic pain: Secondary | ICD-10-CM

## 2016-02-17 DIAGNOSIS — M25561 Pain in right knee: Principal | ICD-10-CM

## 2016-02-17 MED ORDER — HYDROCODONE-ACETAMINOPHEN 5-325 MG PO TABS: 1.0000 | ORAL_TABLET | Freq: Four times a day (QID) | ORAL | 0 refills | Status: DC | PRN

## 2016-02-17 NOTE — Telephone Encounter (Signed)
Patient requests a refill on Hydrocodone/Acetaminophen 5-325  Mgs.   Qty  30  Sig: Take 1 tablet by mouth every 6 (six) hours as needed for moderate pain.

## 2016-02-29 ENCOUNTER — Other Ambulatory Visit (HOSPITAL_COMMUNITY): Payer: Self-pay | Admitting: Hematology & Oncology

## 2016-03-06 ENCOUNTER — Encounter (HOSPITAL_COMMUNITY): Payer: Self-pay

## 2016-03-06 ENCOUNTER — Encounter (HOSPITAL_COMMUNITY): Payer: Medicare Other | Attending: Hematology & Oncology

## 2016-03-06 VITALS — BP 128/55 | HR 69 | Temp 98.1°F | Resp 18

## 2016-03-06 DIAGNOSIS — D500 Iron deficiency anemia secondary to blood loss (chronic): Secondary | ICD-10-CM

## 2016-03-06 DIAGNOSIS — K921 Melena: Secondary | ICD-10-CM | POA: Insufficient documentation

## 2016-03-06 DIAGNOSIS — D509 Iron deficiency anemia, unspecified: Secondary | ICD-10-CM | POA: Insufficient documentation

## 2016-03-06 MED ORDER — SODIUM CHLORIDE 0.9 % IV SOLN
750.0000 mg | Freq: Once | INTRAVENOUS | Status: AC
  Administered 2016-03-06: 750 mg via INTRAVENOUS
  Filled 2016-03-06: qty 15

## 2016-03-06 MED ORDER — SODIUM CHLORIDE 0.9 % IV SOLN
INTRAVENOUS | Status: DC
Start: 2016-03-06 — End: 2016-03-06
  Administered 2016-03-06: 14:00:00 via INTRAVENOUS

## 2016-03-06 NOTE — Progress Notes (Signed)
Injectafer given today per orders. Patient tolerated it well, no problems. Vitals stable and discharged ambulatory from clinic. Follow up as scheduled.

## 2016-03-06 NOTE — Patient Instructions (Signed)
[facility] Discharge Instructions  RECOMMENDATIONS MADE BY THE CONSULTANT AND ANY TEST RESULTS WILL BE SENT TO YOUR REFERRING PHYSICIAN.  Injectafer given today Follow up as scheduled.  Thank you for choosing [facility] to provide your oncology and hematology care.  To afford each patient quality time with our provider, please arrive at least 15 minutes before your scheduled appointment time.    If you have a lab appointment with the Cancer Center please come in thru the  Main Entrance and check in at the main information desk  You need to re-schedule your appointment should you arrive 10 or more minutes late.  We strive to give you quality time with our providers, and arriving late affects you and other patients whose appointments are after yours.  Also, if you no show three or more times for appointments you may be dismissed from the clinic at the providers discretion.     Again, thank you for choosing [facility].  Our hope is that these requests will decrease the amount of time that you wait before being seen by our physicians.       _____________________________________________________________  Should you have questions after your visit to [facility], please contact our office at [phone] between the hours of 8:30 a.m. and 4:30 p.m.  Voicemails left after 4:30 p.m. will not be returned until the following business day.  For prescription refill requests, have your pharmacy contact our office.       Resources For Cancer Patients and their Caregivers ? American Cancer Society: Can assist with transportation, wigs, general needs, runs Look Good Feel Better.        1-888-227-6333 ? Cancer Care: Provides financial assistance, online support groups, medication/co-pay assistance.  1-800-813-HOPE (4673) ? [facility] Assists [place] cancer patients and  their families through emotional , educational and financial support.  336-427-4357 ? [place] DSS Where to apply for food stamps, Medicaid and utility assistance. 336-342-1394 ? [facility]: Transportation to medical appointments. 336-347-2287 ? Social Security Administration: May apply for disability if have a Stage IV cancer. 336-342-7796 1-800-772-1213 ? [place] Aging, Disability and Transit Services: Assists with nutrition, care and transit needs. 336-349-2343  Cancer Center Support Programs: @10RELATIVEDAYS@ > Cancer Support Group  2nd Tuesday of the month 1pm-2pm, Journey Room  > Creative Journey  3rd Tuesday of the month 1130am-1pm, Journey Room  > Look Good Feel Better  1st Wednesday of the month 10am-12 noon, Journey Room (Call American Cancer Society to register 1-800-395-5775)   

## 2016-03-21 ENCOUNTER — Ambulatory Visit (INDEPENDENT_AMBULATORY_CARE_PROVIDER_SITE_OTHER): Payer: Medicare Other | Admitting: Orthopedic Surgery

## 2016-03-21 ENCOUNTER — Ambulatory Visit (INDEPENDENT_AMBULATORY_CARE_PROVIDER_SITE_OTHER): Payer: Medicare Other

## 2016-03-21 VITALS — Ht 65.0 in | Wt 206.0 lb

## 2016-03-21 DIAGNOSIS — S93491A Sprain of other ligament of right ankle, initial encounter: Secondary | ICD-10-CM | POA: Diagnosis not present

## 2016-03-21 DIAGNOSIS — M25571 Pain in right ankle and joints of right foot: Secondary | ICD-10-CM

## 2016-03-21 NOTE — Progress Notes (Signed)
Patient ID: Debra Harris, female   DOB: [birth year], 66 y.o.   MRN: [id number]  Chief Complaint  Patient presents with  . Ankle Pain    Right ankle pain, DOI 03-21-16    HPI Debra Harris is a 66 y.o. female.   HPI 66 year old female fell today going down a step twisted her right ankle complains of right ankle pain and swelling  Review of Systems Review of Systems Normal neuro  Denies fever  Examination Ht 5\' 5"  (1.651 m)   Wt 206 lb (93.4 kg)   BMI 34.28 kg/m   Gen. appearance the patient's appearance is normal with normal grooming and  hygiene The patient is oriented to person place and time Mood and affect are normal   Ortho Exam Gait is remarkable for significant limping favoring the right lower extremity she is using a cane in the right hand she is advised to switch to the left   Inspection reveals tenderness and swelling over the anterior talofibular ligament ROM is 5 dorsiflexion 10 plantar flexion Stability tests are normal but we may need to repeat what pain and swelling have gotten better Motor exam 5/5 manual muscle testing , no atrophy  Skin is normal (no rash or erythema)    Medical decision-making Diagnosis, Data, Plan (risk)  Ankle x-ray 3 views normal no fracture  Encounter Diagnoses  Name Primary?  . Pain in joint involving right ankle and foot Yes  . Sprain of anterior talofibular ligament of right ankle, initial encounter     ASO brace weight-bear as tolerated cane left hand  Ice 48 hours and contrast baths  Follow-up after 3 weeks if no improvement  Arther Abbott, MD 03/21/2016 5:02 PM

## 2016-03-21 NOTE — Patient Instructions (Signed)
ICE X 48 HRS   CONTRAST BATHS X 5 DAYS AFTER THAT

## 2016-03-28 ENCOUNTER — Encounter (HOSPITAL_COMMUNITY): Payer: Medicare Other

## 2016-03-28 DIAGNOSIS — K921 Melena: Secondary | ICD-10-CM | POA: Diagnosis not present

## 2016-03-28 DIAGNOSIS — D509 Iron deficiency anemia, unspecified: Secondary | ICD-10-CM | POA: Diagnosis not present

## 2016-03-28 LAB — CBC WITH DIFFERENTIAL/PLATELET
Basophils Absolute: 0.1 10*3/uL (ref 0.0–0.1)
Basophils Relative: 1 %
Eosinophils Absolute: 0.3 10*3/uL (ref 0.0–0.7)
Eosinophils Relative: 5 %
HCT: 33.6 % — ABNORMAL LOW (ref 36.0–46.0)
Hemoglobin: 10.9 g/dL — ABNORMAL LOW (ref 12.0–15.0)
LYMPHS ABS: 1.5 10*3/uL (ref 0.7–4.0)
LYMPHS PCT: 20 %
MCH: 27.9 pg (ref 26.0–34.0)
MCHC: 32.4 g/dL (ref 30.0–36.0)
MCV: 85.9 fL (ref 78.0–100.0)
MONO ABS: 0.4 10*3/uL (ref 0.1–1.0)
MONOS PCT: 5 %
Neutro Abs: 5 10*3/uL (ref 1.7–7.7)
Neutrophils Relative %: 69 %
PLATELETS: 247 10*3/uL (ref 150–400)
RBC: 3.91 MIL/uL (ref 3.87–5.11)
RDW: 16.3 % — ABNORMAL HIGH (ref 11.5–15.5)
WBC: 7.2 10*3/uL (ref 4.0–10.5)

## 2016-03-28 LAB — FERRITIN: FERRITIN: 132 ng/mL (ref 11–307)

## 2016-04-03 DIAGNOSIS — Z961 Presence of intraocular lens: Secondary | ICD-10-CM | POA: Diagnosis not present

## 2016-04-03 DIAGNOSIS — H43813 Vitreous degeneration, bilateral: Secondary | ICD-10-CM | POA: Diagnosis not present

## 2016-04-03 DIAGNOSIS — D3131 Benign neoplasm of right choroid: Secondary | ICD-10-CM | POA: Diagnosis not present

## 2016-04-03 DIAGNOSIS — H04123 Dry eye syndrome of bilateral lacrimal glands: Secondary | ICD-10-CM | POA: Diagnosis not present

## 2016-04-03 DIAGNOSIS — H401132 Primary open-angle glaucoma, bilateral, moderate stage: Secondary | ICD-10-CM | POA: Diagnosis not present

## 2016-05-06 ENCOUNTER — Encounter: Payer: Self-pay | Admitting: Pulmonary Disease

## 2016-05-09 ENCOUNTER — Encounter: Payer: Self-pay | Admitting: Pulmonary Disease

## 2016-05-10 ENCOUNTER — Encounter: Payer: Self-pay | Admitting: Pulmonary Disease

## 2016-05-10 ENCOUNTER — Ambulatory Visit (INDEPENDENT_AMBULATORY_CARE_PROVIDER_SITE_OTHER): Payer: Medicare Other | Admitting: Pulmonary Disease

## 2016-05-10 VITALS — BP 120/70 | HR 79 | Ht 65.0 in | Wt 212.6 lb

## 2016-05-10 DIAGNOSIS — R011 Cardiac murmur, unspecified: Secondary | ICD-10-CM | POA: Diagnosis not present

## 2016-05-10 DIAGNOSIS — K219 Gastro-esophageal reflux disease without esophagitis: Secondary | ICD-10-CM | POA: Diagnosis not present

## 2016-05-10 DIAGNOSIS — Z9989 Dependence on other enabling machines and devices: Secondary | ICD-10-CM | POA: Diagnosis not present

## 2016-05-10 DIAGNOSIS — G4733 Obstructive sleep apnea (adult) (pediatric): Secondary | ICD-10-CM

## 2016-05-10 DIAGNOSIS — J3089 Other allergic rhinitis: Secondary | ICD-10-CM | POA: Diagnosis not present

## 2016-05-10 DIAGNOSIS — K449 Diaphragmatic hernia without obstruction or gangrene: Secondary | ICD-10-CM

## 2016-05-10 NOTE — Progress Notes (Signed)
Subjective:    Patient ID: Debra Harris, female    DOB: [birth year], 66 y.o.   MRN: [id number]  C.C.:  Follow-up for Severe OSA, Allergic Rhinitis, & GERD/Hiatal Hernia.  HPI Severe OSA: Patient currently prescribed CPAP therapy. Patient previously using full face mask. Sleep study was completed in 2012. Patient has had some difficulty with her current machine and is using a family members machine. She reports she is sleeping well with her machine and has excellent sleep quality. She is using heated humidification.   Seasonal allergic rhinitis: Previously on Zyrtec. She reports no significant sinus congestion, pressure, or drainage. She does use benadryl sparingly.   GERD with hiatal hernia: Previously on Dexilant. Esophagitis seen on EGD. She reports she was taken off her PPI due to bleeding. She reports she still was having reflux on the medication. She is taking OTC Prilosec. She does have occasional morning brash water taste. Previously seen by GI but reportedly discharged when they couldn't find the source of her bleeding.   Review of Systems Patient reports chronic fibromyalgia pain. She reports intermittent joint pain as well. She reports baseline dyspnea at rest and also with exertion. Still getting iron infusions for blood loss. She reports chronic abdominal pain but no nausea. No cough.   No Known Allergies  Current Outpatient Prescriptions on File Prior to Visit  Medication Sig Dispense Refill  . cetirizine (ZYRTEC) 10 MG tablet Take 10 mg by mouth daily.     Marland Kitchen HYDROcodone-acetaminophen (NORCO) 5-325 MG tablet Take 1 tablet by mouth every 6 (six) hours as needed for moderate pain. 30 tablet 0  . metFORMIN (GLUCOPHAGE) 500 MG tablet Take 1,000 mg by mouth daily with breakfast.     . Probiotic Product (TRUNATURE DIGESTIVE PROBIOTIC PO) Take 1 capsule by mouth daily. Reported on 08/20/2015    . TURMERIC PO Take 1 tablet by mouth 2 (two) times daily.    Marland Kitchen UNKNOWN TO PATIENT CPAP  machine    . Wheat Dextrin (BENEFIBER PO) Take by mouth.     No current facility-administered medications on file prior to visit.     Past Medical History:  Diagnosis Date  . Adenomatous colon polyp   . Allergic rhinitis   . Arthritis   . Diabetes mellitus   . Diverticulosis   . External hemorrhoids   . Fibromyalgia   . GERD (gastroesophageal reflux disease)   . Glaucoma   . Hemorrhoids    grade 3   . IDA (iron deficiency anemia)   . Iron deficiency anemia 06/05/2014  . Low tension glaucoma   . Neuropathy, leg   . Ovarian cyst 11/20/00  . Sleep apnea    CPAP  . Sliding hiatal hernia     Past Surgical History:  Procedure Laterality Date  . CARPAL TUNNEL RELEASE Bilateral   . CATARACT EXTRACTION     bilateral  . CHOLECYSTECTOMY    . COLONOSCOPY  2005   Small external hemorrhoids, single sigmoid colon diverticulum, small polyp at the hepatic flexure focally adenomatous.  . COLONOSCOPY  2010   Dr. Gala Romney, Versed 4/Demerol 75. Pancolonic diverticula, external hemorrhoids. Colonoscopy every 5 years for history of adenomatous polyps  . COLONOSCOPY  2014   Dr. Olevia Perches: Propofol. mild diverticulosis in the sigmoid colon, random colon biopsies benign.  . COLONOSCOPY WITH PROPOFOL N/A 09/13/2015   Procedure: COLONOSCOPY WITH PROPOFOL;  Surgeon: Daneil Dolin, MD;  Location: [facility];  Service: Endoscopy;  Laterality: N/A;  1030  .  ESOPHAGEAL DILATION N/A 10/26/2014   Procedure: ESOPHAGEAL DILATION [place];  Surgeon: Daneil Dolin, MD;  Location: [facility];  Service: Endoscopy;  Laterality: N/A;  . ESOPHAGOGASTRODUODENOSCOPY  2010   Dr. Gala Romney, distal esophageal erosions, Venia Minks dilator passed. Comment small hiatal hernia, patulous EG junction.  . ESOPHAGOGASTRODUODENOSCOPY (EGD) WITH PROPOFOL N/A 10/26/2014   Dr.Rourk- distal esophageal erosions c/w mild erosive reflux esophagitis. hiatal hernia, o/w normal EGD  . ESOPHAGOGASTRODUODENOSCOPY (EGD) WITH PROPOFOL N/A 09/13/2015    Procedure: ESOPHAGOGASTRODUODENOSCOPY (EGD) WITH PROPOFOL;  Surgeon: Daneil Dolin, MD;  Location: [facility];  Service: Endoscopy;  Laterality: N/A;  . GIVENS CAPSULE STUDY N/A 10/26/2014   couple of mucosal breaks/erosions but essentially unremarkable small bowel study  . HEMORRHOID BANDING    . KNEE SURGERY     right-arthroscopy  . OOPHORECTOMY  age 36s  . PARTIAL HYSTERECTOMY  age 66s  . RECTAL EXAM UNDER ANESTHESIA N/A 10/26/2014   circumferential grade 3 hemoorrhoids easily reducible  . thumb surger Bilateral    trigger thumb    Family History  Problem Relation Age of Onset  . Ulcers Mother   . Glaucoma Mother   . Heart disease Mother   . Glaucoma Maternal Grandmother   . Heart disease Maternal Grandmother   . Heart disease Maternal Grandfather   . Colon cancer Neg Hx     Social History   Social History  . Marital status: Married    Spouse name: J. Orland Dec  . Number of children: Y  . Years of education: N/A   Occupational History  . AUDIOLOGIST    Social History Main Topics  . Smoking status: Never Smoker  . Smokeless tobacco: Never Used     Comment: Pt reports tried once in 7th grade//ms  . Alcohol use 0.6 oz/week    1 Glasses of wine per week     Comment: rare  . Drug use: No  . Sexual activity: No   Other Topics Concern  . None   Social History Narrative   3 children & married to orthopedist. 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         Objective:   Physical Exam BP 120/70 (BP Location: Left Arm, Patient Position: Sitting, Cuff Size: Large)   Pulse 79   Ht 5' 5"  (1.651 m)   Wt 212 lb 9.6 oz (96.4 kg)   SpO2 97%   BMI 35.38 kg/m   Gen.: No distress. Mild central obesity. Comfortable. Integument: No rash or bruising on exposed skin. Warm and dry. HEENT: Mild bilateral nasal turbinate swelling. No oral ulcers. No scleral icterus. Cardiovascular: Regular rate and rhythm. Edema lower extremity edema with compression stockings.  3/6 systolic ejection murmur at the aortic position. Abdomen: Soft. Protuberant. Normal bowel sounds. Pulmonary: Good aeration bilaterally. Clear to auscultation. No accessory muscle use on room air.  POLYSOMNOGRAM 11/25/2010:  AHI 68.6 events/hr. low saturation 81% during REM sleep. Periodic limb movement index 0. Average heart rate 69 bpm with no significant dysrhythmias noted. Optimal CPAP 13cm H2O.  COMPLIANCE DOWNLOAD 02/07/16 - 05/06/16:  44% usage. Average usage on days used 8 hours 32 minutes. AutoSet with minimum pressure 5 cm H2O & maximum pressure 15 cm H2O. Maximum leaks 55.6. Residual AHI 0.5. 02/10/16 - 05/09/16: 56% usage. AutoSet 5 cm H2O pressure minimum & 11 cm H2O maximum. Maximum leaks 62.3. Residual AHI 0.5. Average usage on days used 7 hours 25 minutes. 10/15/14 - 01/12/15:  S9 Autoset CPAP. Maximum  pressure of 15 cm H2O & minimum pressure 57 m H2O. Residual AHI 0.8 events/hour. 99% usage with greater than 4 hours. Used 89 out of 90 days.  LABS 01/01/15 CBC: 6.1/12.3/38.7/240 Ferritin: 213 (46 on 11/19/14)    Assessment & Plan:  66 y.o. female with underlying severe OSA, seasonal allergic rhinitis, & GERD with hiatal hernia. Patient has excellent compliance with her home CPAP. She has had to use an alternative machine at times due to malfunction and travel. HER-2 downloads above both show excellent adherence when taken into account that she is spending half her time at one location and a half her time at another location. Given the mechanical problems with her machine as well as his advanced age I feel it's reasonable to supply her with a new machine that is more portable. Overall her reflux seems to be reasonably controlled at this time as is her allergic rhinitis. I instructed the patient contact my office if she had any new breathing problems or questions before next appointment.  1. Severe OSA:  Prescribing a new Auto-CPAP with patient's current settings. 2. Seasonal  allergic rhinitis: Continuing Zyrtec. No changes at this time. 3. GERD with hiatal hernia: Continuing Prilosec. No changes. 4. Heart murmur: Checking complete transthoracic echocardiogram. 5. Follow-up:  Return to clinic in 1 year or sooner if needed.   Sonia Baller Ashok Cordia, M.D. [facility] Pager:  [phone] After 3pm or if no response, call [phone] 1:44 PM 05/10/16

## 2016-05-10 NOTE — Patient Instructions (Addendum)
   We are going to send in a prescription for a new CPAP.  I'm going to do an echocardiogram to evaluate your heart murmur.  Call me if you have any new breathing problems or questions before your next appointment.  I will see you back in 1 year or sooner if needed.   TESTS ORDERED: 1. Complete Echocardiogram

## 2016-05-16 ENCOUNTER — Encounter (HOSPITAL_COMMUNITY): Payer: Medicare Other | Attending: Oncology

## 2016-05-16 ENCOUNTER — Ambulatory Visit (HOSPITAL_COMMUNITY)
Admission: RE | Admit: 2016-05-16 | Discharge: 2016-05-16 | Disposition: A | Discharge status: Home / Self Care | Payer: Medicare Other | Source: Ambulatory Visit | Attending: Pulmonary Disease | Admitting: Pulmonary Disease

## 2016-05-16 DIAGNOSIS — K219 Gastro-esophageal reflux disease without esophagitis: Secondary | ICD-10-CM | POA: Diagnosis not present

## 2016-05-16 DIAGNOSIS — I071 Rheumatic tricuspid insufficiency: Secondary | ICD-10-CM | POA: Diagnosis not present

## 2016-05-16 DIAGNOSIS — R011 Cardiac murmur, unspecified: Secondary | ICD-10-CM | POA: Insufficient documentation

## 2016-05-16 DIAGNOSIS — K921 Melena: Secondary | ICD-10-CM | POA: Insufficient documentation

## 2016-05-16 DIAGNOSIS — R1310 Dysphagia, unspecified: Secondary | ICD-10-CM | POA: Insufficient documentation

## 2016-05-16 DIAGNOSIS — D509 Iron deficiency anemia, unspecified: Secondary | ICD-10-CM | POA: Diagnosis not present

## 2016-05-16 LAB — CBC WITH DIFFERENTIAL/PLATELET
Basophils Absolute: 0 10*3/uL (ref 0.0–0.1)
Basophils Relative: 1 %
Eosinophils Absolute: 0.2 10*3/uL (ref 0.0–0.7)
Eosinophils Relative: 3 %
HEMATOCRIT: 31.1 % — AB (ref 36.0–46.0)
Hemoglobin: 9.9 g/dL — ABNORMAL LOW (ref 12.0–15.0)
Lymphocytes Relative: 23 %
Lymphs Abs: 1.3 10*3/uL (ref 0.7–4.0)
MCH: 25.6 pg — ABNORMAL LOW (ref 26.0–34.0)
MCHC: 31.8 g/dL (ref 30.0–36.0)
MCV: 80.6 fL (ref 78.0–100.0)
MONO ABS: 0.3 10*3/uL (ref 0.1–1.0)
MONOS PCT: 5 %
NEUTROS ABS: 3.8 10*3/uL (ref 1.7–7.7)
Neutrophils Relative %: 68 %
Platelets: 278 10*3/uL (ref 150–400)
RBC: 3.86 MIL/uL — ABNORMAL LOW (ref 3.87–5.11)
RDW: 14.9 % (ref 11.5–15.5)
WBC: 5.6 10*3/uL (ref 4.0–10.5)

## 2016-05-16 LAB — FERRITIN: Ferritin: 5 ng/mL — ABNORMAL LOW (ref 11–307)

## 2016-05-16 NOTE — Progress Notes (Signed)
*  PRELIMINARY RESULTS* Echocardiogram 2D Echocardiogram has been performed.  Leavy Cella 05/16/2016, 11:42 AM

## 2016-05-17 NOTE — Progress Notes (Signed)
Spoke with patient and informed her of recommendations. She had no further question nothing further is needed.

## 2016-05-17 NOTE — Progress Notes (Signed)
Spoke with patient and informed her of results. Pt wanted to know if the leaking is something that is progressive and what should she do going forwards? Dr. Ashok Cordia please advise. Thank you!

## 2016-05-18 ENCOUNTER — Other Ambulatory Visit (HOSPITAL_COMMUNITY): Payer: Medicare Other

## 2016-05-19 ENCOUNTER — Ambulatory Visit (HOSPITAL_COMMUNITY): Payer: Medicare Other

## 2016-05-19 ENCOUNTER — Other Ambulatory Visit (HOSPITAL_COMMUNITY): Payer: Medicare Other

## 2016-05-24 ENCOUNTER — Other Ambulatory Visit (HOSPITAL_COMMUNITY): Payer: Medicare Other

## 2016-05-24 DIAGNOSIS — E119 Type 2 diabetes mellitus without complications: Secondary | ICD-10-CM | POA: Diagnosis not present

## 2016-05-24 DIAGNOSIS — Z23 Encounter for immunization: Secondary | ICD-10-CM | POA: Diagnosis not present

## 2016-05-24 DIAGNOSIS — Z6837 Body mass index (BMI) 37.0-37.9, adult: Secondary | ICD-10-CM | POA: Diagnosis not present

## 2016-05-24 DIAGNOSIS — Z1159 Encounter for screening for other viral diseases: Secondary | ICD-10-CM | POA: Diagnosis not present

## 2016-05-30 ENCOUNTER — Encounter (HOSPITAL_COMMUNITY): Payer: Self-pay

## 2016-05-30 ENCOUNTER — Other Ambulatory Visit: Payer: Self-pay | Admitting: *Deleted

## 2016-05-30 ENCOUNTER — Encounter (HOSPITAL_COMMUNITY): Payer: Medicare Other | Attending: Hematology & Oncology | Admitting: Oncology

## 2016-05-30 VITALS — BP 137/66 | HR 67 | Temp 98.2°F | Resp 20 | Wt 214.8 lb

## 2016-05-30 DIAGNOSIS — K909 Intestinal malabsorption, unspecified: Secondary | ICD-10-CM

## 2016-05-30 DIAGNOSIS — K921 Melena: Secondary | ICD-10-CM | POA: Insufficient documentation

## 2016-05-30 DIAGNOSIS — D509 Iron deficiency anemia, unspecified: Secondary | ICD-10-CM | POA: Insufficient documentation

## 2016-05-30 DIAGNOSIS — K625 Hemorrhage of anus and rectum: Secondary | ICD-10-CM

## 2016-05-30 DIAGNOSIS — D500 Iron deficiency anemia secondary to blood loss (chronic): Secondary | ICD-10-CM

## 2016-05-30 MED ORDER — HYLAN G-F 20 48 MG/6ML IX SOSY: 1.0000 | PREFILLED_SYRINGE | Freq: Once | INTRA_ARTICULAR | 0 refills | Status: AC

## 2016-05-30 NOTE — Patient Instructions (Signed)
[place] at [facility] Discharge Instructions  RECOMMENDATIONS MADE BY THE CONSULTANT AND ANY TEST RESULTS WILL BE SENT TO YOUR REFERRING PHYSICIAN.  You were seen today by Dr. Twana First We will schedule you for your injectafer x 2 doses Follow up in 2 months with lab work See Amy up front for appointments   Thank you for choosing [place] at [facility] to provide your oncology and hematology care.  To afford each patient quality time with our provider, please arrive at least 15 minutes before your scheduled appointment time.    If you have a lab appointment with the [facility] please come in thru the  Main Entrance and check in at the main information desk  You need to re-schedule your appointment should you arrive 10 or more minutes late.  We strive to give you quality time with our providers, and arriving late affects you and other patients whose appointments are after yours.  Also, if you no show three or more times for appointments you may be dismissed from the clinic at the providers discretion.     Again, thank you for choosing [facility].  Our hope is that these requests will decrease the amount of time that you wait before being seen by our physicians.       _____________________________________________________________  Should you have questions after your visit to [facility], please contact our office at [phone] between the hours of 8:30 a.m. and 4:30 p.m.  Voicemails left after 4:30 p.m. will not be returned until the following business day.  For prescription refill requests, have your pharmacy contact our office.       Resources For Cancer Patients and their Caregivers ? American Cancer Society: Can assist with transportation, wigs, general needs, runs Look Good Feel Better.        ([phone] ? Cancer Care: Provides financial assistance, online support groups, medication/co-pay  assistance.  1-800-813-HOPE 352-775-6468) ? [place] Assists [place] cancer patients and their families through emotional , educational and financial support.  ([phone] ? [place] DSS Where to apply for food stamps, Medicaid and utility assistance. 905-202-6614 ? RCATS: Transportation to medical appointments. 828-287-7131 ? Social Security Administration: May apply for disability if have a Stage IV cancer. 772-605-0242 [phone] ? [employer], Disability and Transit Services: Assists with nutrition, care and transit needs. [place] Support Programs: @10RELATIVEDAYS @ > Cancer Support Group  2nd Tuesday of the month 1pm-2pm, Journey Room  > Creative Journey  3rd Tuesday of the month 1130am-1pm, Journey Room  > Look Good Feel Better  1st Wednesday of the month 10am-12 noon, Journey Room (Call [place] to register [phone])

## 2016-05-30 NOTE — Progress Notes (Signed)
[place] PROGRESS NOTE  Patient Care Team: Asencion Noble, MD as PCP - General (Internal Medicine) Daneil Dolin, MD as Consulting Physician (Gastroenterology)  CHIEF COMPLAINTS/PURPOSE OF CONSULTATION:  Iron deficiency anemia Last Colonoscopy 11/01/2012 with diverticulosis CBC on 04/28/2013 shows a hemoglobin of 11.3, hematocrit of 35.9, and MCV of 75.7. RDW is elevated at 19.3 CBC dated 03/19/2013 shows a hemoglobin of 9.7, hematocrit of 30.2, MCV of 73.3. Ferritin level of 13 ng/ml on 04/2014 EGD 10/26/2014 with erosive esophagitis  HISTORY OF PRESENTING ILLNESS:  Debra Harris 66 y.o. female returns for follow up of recurrent iron deficiency.   She is doing well today. She isn't sure if she even has hemorrhoids, but she continues to have rectal bleeding. It is a constant flow of blood, and she has to wear a pad. She is confident it isn't from her vagina. The bleeding isn't every day. She has had 3 colonoscopies in the last 6 years to try to determine what is wrong, and had multiple procedures including banding of hemorrhoids, and removal of polyps.  The bleeding is worse after sitting for long periods of time. Her last episode of rectal bleeding was yesterday and today. She has a lot of fatigue. She has joint pain, tingling in her feet, and leg swelling. She wears compression socks. Pt has some abdominal pain. Denies constipation, pica, chest pain, or any other concerns.   MEDICAL HISTORY:  Past Medical History:  Diagnosis Date  . Adenomatous colon polyp   . Allergic rhinitis   . Arthritis   . Diabetes mellitus   . Diverticulosis   . External hemorrhoids   . Fibromyalgia   . GERD (gastroesophageal reflux disease)   . Glaucoma   . Hemorrhoids    grade 3   . IDA (iron deficiency anemia)   . Iron deficiency anemia 06/05/2014  . Low tension glaucoma   . Neuropathy, leg   . Ovarian cyst 11/20/00  . Sleep apnea    CPAP  . Sliding hiatal hernia     SURGICAL  HISTORY: Past Surgical History:  Procedure Laterality Date  . CARPAL TUNNEL RELEASE Bilateral   . CATARACT EXTRACTION     bilateral  . CHOLECYSTECTOMY    . COLONOSCOPY  2005   Small external hemorrhoids, single sigmoid colon diverticulum, small polyp at the hepatic flexure focally adenomatous.  . COLONOSCOPY  2010   Dr. Gala [place], Versed 4/Demerol 75. Pancolonic diverticula, external hemorrhoids. Colonoscopy every 5 years for history of adenomatous polyps  . COLONOSCOPY  2014   Dr. Olevia Perches: Propofol. mild diverticulosis in the sigmoid colon, random colon biopsies benign.  . COLONOSCOPY WITH PROPOFOL N/A 09/13/2015   Procedure: COLONOSCOPY WITH PROPOFOL;  Surgeon: Daneil Dolin, MD;  [facility]: [facility];  Service: Endoscopy;  Laterality: N/A;  1030  . ESOPHAGEAL DILATION N/A 10/26/2014   Procedure: ESOPHAGEAL DILATION [place];  Surgeon: Daneil Dolin, MD;  Location: [facility] ORS;  Service: Endoscopy;  Laterality: N/A;  . ESOPHAGOGASTRODUODENOSCOPY  2010   Dr. Gala Romney, distal esophageal erosions, Venia Minks dilator passed. Comment small hiatal hernia, patulous EG junction.  . ESOPHAGOGASTRODUODENOSCOPY (EGD) WITH PROPOFOL N/A 10/26/2014   Dr.Rourk- distal esophageal erosions c/w mild erosive reflux esophagitis. hiatal hernia, o/w normal EGD  . ESOPHAGOGASTRODUODENOSCOPY (EGD) WITH PROPOFOL N/A 09/13/2015   Procedure: ESOPHAGOGASTRODUODENOSCOPY (EGD) WITH PROPOFOL;  Surgeon: Daneil Dolin, MD;  [facility]: [facility];  Service: Endoscopy;  Laterality: N/A;  . GIVENS CAPSULE STUDY N/A 10/26/2014   couple  of mucosal breaks/erosions but essentially unremarkable small bowel study  . HEMORRHOID BANDING    . KNEE SURGERY     right-arthroscopy  . OOPHORECTOMY  age 88s  . PARTIAL HYSTERECTOMY  age 89s  . RECTAL EXAM UNDER ANESTHESIA N/A 10/26/2014   circumferential grade 3 hemoorrhoids easily reducible  . thumb surger Bilateral    trigger thumb    SOCIAL HISTORY: Social History   Social History  .  Marital status: Married    Spouse name: J. Orland Dec  . Number of children: Y  . Years of education: N/A   Occupational History  . AUDIOLOGIST    Social History Main Topics  . Smoking status: Never Smoker  . Smokeless tobacco: Never Used     Comment: Pt reports tried once in 7th grade//ms  . Alcohol use 0.6 oz/week    1 Glasses of wine per week     Comment: rare  . Drug use: No  . Sexual activity: No   Other Topics Concern  . Not on file   Social History Narrative   3 children & married to orthopedist. 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      3 children and one grandchild. She has been married for over 40 years. She is a never smoker. She does not drink alcohol. She still is very active and works outside the home.  FAMILY HISTORY: Family History  Problem Relation Age of Onset  . Ulcers Mother   . Glaucoma Mother   . Heart disease Mother   . Glaucoma Maternal Grandmother   . Heart disease Maternal Grandmother   . Heart disease Maternal Grandfather   . Colon cancer Neg Hx    indicated that the status of her mother is unknown. She indicated that the status of her maternal grandmother is unknown. She indicated that the status of her maternal grandfather is unknown. She indicated that the status of her neg hx is unknown.   Father died when the patient was 39 from suicide. Her mother died at 62 from an MI. She had glaucoma that caused blindness. She has no siblings.   ALLERGIES:  has No Known Allergies.  MEDICATIONS:  Current Outpatient Prescriptions  Medication Sig Dispense Refill  . Acetylcarnitine HCl (ACETYL L-CARNITINE) 500 MG CAPS Take 1 capsule by mouth daily.    . [place]-Glucosamine-Vit D (RA GLUCOSAMINE & VITAMIN D3 PO) Take 1 tablet by mouth daily.    . cetirizine (ZYRTEC) 10 MG tablet Take 10 mg by mouth daily.     . Coenzyme Q10 (CO Q 10 PO) Take 1 tablet by mouth daily.    . Cyanocobalamin (VITAMIN B-12) 5000 MCG SUBL Place 1 tablet under  the tongue daily.    Marland Kitchen HYDROcodone-acetaminophen (NORCO) 5-325 MG tablet Take 1 tablet by mouth every 6 (six) hours as needed for moderate pain. 30 tablet 0  . Magnesium 400 MG TABS Take 1 tablet by mouth daily.    . metFORMIN (GLUCOPHAGE) 500 MG tablet Take 1,000 mg by mouth daily with breakfast.     . Multiple Vitamins-Minerals (CENTRUM SILVER 50+WOMEN PO) Take 1 tablet by mouth daily.    . Probiotic Product (TRUNATURE DIGESTIVE PROBIOTIC PO) Take 1 capsule by mouth daily. Reported on 08/20/2015    . TURMERIC PO Take 1 tablet by mouth 2 (two) times daily.    Marland Kitchen UNKNOWN TO PATIENT CPAP machine    . Wheat Dextrin (BENEFIBER PO) Take by mouth.     No current facility-administered  medications for this visit.    Review of Systems  Constitutional: Positive for malaise/fatigue.  HENT: Negative.   Eyes: Negative.   Respiratory: Positive for shortness of breath.   Cardiovascular: Positive for leg swelling. Negative for chest pain.  Gastrointestinal: Positive for abdominal pain and blood in stool. Negative for constipation.       Constant rectal bleeding  Genitourinary: Negative.   Musculoskeletal: Positive for joint pain.  Skin: Negative.   Neurological: Positive for tingling.  Endo/Heme/Allergies: Negative.   Psychiatric/Behavioral: Negative.   All other systems reviewed and are negative. 14 point review of systems was performed and is negative except as detailed under history of present illness and above  PHYSICAL EXAMINATION: ECOG PERFORMANCE STATUS: 0 - Asymptomatic  Vitals:   05/30/16 1025  BP: 137/66  Pulse: 67  Resp: 20  Temp: 98.2 F (36.8 C)   Filed Weights   05/30/16 1025  Weight: 214 lb 12.8 oz (97.4 kg)   Physical Exam  Constitutional: She is oriented to person, place, and time and well-developed, well-nourished, and in no distress.  HENT:  Head: Normocephalic and atraumatic.  Eyes: EOM are normal. Pupils are equal, round, and reactive to light.  Neck: Normal range  of motion. Neck supple.  Cardiovascular: Normal rate, regular rhythm and normal heart sounds.   Pulmonary/Chest: Effort normal and breath sounds normal.  Abdominal: Soft. Bowel sounds are normal.  Musculoskeletal: Normal range of motion.  Neurological: She is alert and oriented to person, place, and time. Gait normal.  Skin: Skin is warm and dry.  Nursing note and vitals reviewed.  LABORATORY DATA:  I have reviewed the data as listed Lab Results  Component Value Date   WBC 5.6 05/16/2016   HGB 9.9 (L) 05/16/2016   HCT 31.1 (L) 05/16/2016   MCV 80.6 05/16/2016   PLT 278 05/16/2016   CMP Latest Ref Rng & Units 09/10/2015 10/09/2014 09/07/2014  Glucose 65 - 99 mg/dL 149(H) 110(H) 129(H)  BUN 6 - 20 mg/dL 11 14 13   Creatinine 0.44 - 1.00 mg/dL 0.57 0.84 0.64  Sodium 135 - 145 mmol/L 137 140 140  Potassium 3.5 - 5.1 mmol/L 3.9 4.0 3.7  Chloride 101 - 111 mmol/L 106 105 108  CO2 22 - 32 mmol/L 25 27 27   Calcium 8.9 - 10.3 mg/dL 9.0 9.2 8.8(L)  Total Protein 6.5 - 8.1 g/dL - 7.5 6.8  Total Bilirubin 0.3 - 1.2 mg/dL - 0.6 0.5  Alkaline Phos 38 - 126 U/L - 70 65  AST 15 - 41 U/L - 35 34  ALT 14 - 54 U/L - 38 35     Chemistry      Component Value Date/Time   NA 137 09/10/2015 0847   K 3.9 09/10/2015 0847   CL 106 09/10/2015 0847   CO2 25 09/10/2015 0847   BUN 11 09/10/2015 0847   CREATININE 0.57 09/10/2015 0847      Component Value Date/Time   CALCIUM 9.0 09/10/2015 0847   ALKPHOS 70 10/09/2014 1435   AST 35 10/09/2014 1435   ALT 38 10/09/2014 1435   BILITOT 0.6 10/09/2014 1435     ASSESSMENT & PLAN:  Iron deficiency anemia Iron malabsorption Chronic GI related blood loss questionable secondary to internal hemorrhoids Fatigue secondary to iron deficiency  Patient has persistent rectal bleeding. Her ferritin is now down to 5. Goal is to keep ferritin level greater than or equal to 100 ng/ml. I will set her up for 2 more doses of injectafer.  Continue f/u with GI for  rectal bleeding.   She will return for follow up in 2 months with CBC, CMP, iron studies.   All questions were answered. The patient knows to call the clinic with any problems, questions or concerns.    This document serves as a record of services personally performed by Twana First, MD. It was created on her behalf by Martinique Casey, a trained medical scribe. The creation of this record is based on the scribe's personal observations and the provider's statements to them. This document has been checked and approved by the attending provider.  I have reviewed the above documentation for accuracy and completeness, and I agree with the above.  This note was electronically signed.

## 2016-05-31 ENCOUNTER — Other Ambulatory Visit: Payer: Self-pay | Admitting: *Deleted

## 2016-05-31 ENCOUNTER — Encounter (HOSPITAL_BASED_OUTPATIENT_CLINIC_OR_DEPARTMENT_OTHER): Payer: Medicare Other

## 2016-05-31 ENCOUNTER — Encounter (HOSPITAL_COMMUNITY): Payer: Self-pay

## 2016-05-31 VITALS — BP 148/69 | HR 64 | Temp 97.9°F | Resp 18

## 2016-05-31 DIAGNOSIS — D500 Iron deficiency anemia secondary to blood loss (chronic): Secondary | ICD-10-CM | POA: Diagnosis not present

## 2016-05-31 DIAGNOSIS — K625 Hemorrhage of anus and rectum: Secondary | ICD-10-CM

## 2016-05-31 MED ORDER — SODIUM CHLORIDE 0.9 % IV SOLN
INTRAVENOUS | Status: DC
  Administered 2016-05-31: 16:00:00 via INTRAVENOUS

## 2016-05-31 MED ORDER — SODIUM CHLORIDE 0.9 % IV SOLN
750.0000 mg | Freq: Once | INTRAVENOUS | Status: AC
  Administered 2016-05-31: 750 mg via INTRAVENOUS
  Filled 2016-05-31: qty 15

## 2016-05-31 NOTE — Progress Notes (Signed)
Debra Harris tolerated Injectafer infusion well without complaints or incident. VSS upon discharge. Pt discharged self ambulatory in satisfactory condition

## 2016-05-31 NOTE — Patient Instructions (Signed)
[facility] at [facility] Discharge Instructions  RECOMMENDATIONS MADE BY THE CONSULTANT AND ANY TEST RESULTS WILL BE SENT TO YOUR REFERRING PHYSICIAN.  Received Injectafer infusion today. Follow-up as scheduled. Call clinic for any questions or concerns  Thank you for choosing [facility] at [facility] to provide your oncology and hematology care.  To afford each patient quality time with our provider, please arrive at least 15 minutes before your scheduled appointment time.    If you have a lab appointment with the Cancer Center please come in thru the  Main Entrance and check in at the main information desk  You need to re-schedule your appointment should you arrive 10 or more minutes late.  We strive to give you quality time with our providers, and arriving late affects you and other patients whose appointments are after yours.  Also, if you no show three or more times for appointments you may be dismissed from the clinic at the providers discretion.     Again, thank you for choosing [facility].  Our hope is that these requests will decrease the amount of time that you wait before being seen by our physicians.       _____________________________________________________________  Should you have questions after your visit to [facility], please contact our office at [phone] between the hours of 8:30 a.m. and 4:30 p.m.  Voicemails left after 4:30 p.m. will not be returned until the following business day.  For prescription refill requests, have your pharmacy contact our office.       Resources For Cancer Patients and their Caregivers ? American Cancer Society: Can assist with transportation, wigs, general needs, runs Look Good Feel Better.        1-888-227-6333 ? Cancer Care: Provides financial assistance, online support groups, medication/co-pay assistance.  1-800-813-HOPE (4673) ? [facility] Assists [place] cancer patients and their families through emotional , educational and financial support.  336-427-4357 ? [place] DSS Where to apply for food stamps, Medicaid and utility assistance. 336-342-1394 ? RCATS: Transportation to medical appointments. 336-347-2287 ? Social Security Administration: May apply for disability if have a Stage IV cancer. 336-342-7796 1-800-772-1213 ? [place] Aging, Disability and Transit Services: Assists with nutrition, care and transit needs. 336-349-2343  Cancer Center Support Programs: @10RELATIVEDAYS@ > Cancer Support Group  2nd Tuesday of the month 1pm-2pm, Journey Room  > Creative Journey  3rd Tuesday of the month 1130am-1pm, Journey Room  > Look Good Feel Better  1st Wednesday of the month 10am-12 noon, Journey Room (Call [employer] to register [phone])   

## 2016-06-02 ENCOUNTER — Ambulatory Visit (HOSPITAL_COMMUNITY): Payer: Medicare Other

## 2016-06-07 ENCOUNTER — Ambulatory Visit (HOSPITAL_COMMUNITY): Payer: Medicare Other

## 2016-06-09 ENCOUNTER — Ambulatory Visit (HOSPITAL_COMMUNITY): Payer: Medicare Other

## 2016-06-09 ENCOUNTER — Telehealth: Payer: Self-pay | Admitting: Pulmonary Disease

## 2016-06-09 NOTE — Telephone Encounter (Signed)
Polysomnogram 11/28/10  Split-night sleep study. Low saturation 81%. This occurred during REM sleep. Diagnostic AHI 16.6 events/hour. Optimal pressure of 13 cm H2O with resolution of obstructive events & good tolerance. Periodic limb movement index 0. Average heart rate 68 with no significant dysrhythmias.

## 2016-06-14 ENCOUNTER — Encounter (HOSPITAL_BASED_OUTPATIENT_CLINIC_OR_DEPARTMENT_OTHER): Payer: Medicare Other

## 2016-06-14 VITALS — BP 134/59 | HR 62 | Temp 98.2°F | Resp 16

## 2016-06-14 DIAGNOSIS — K625 Hemorrhage of anus and rectum: Secondary | ICD-10-CM

## 2016-06-14 DIAGNOSIS — D500 Iron deficiency anemia secondary to blood loss (chronic): Secondary | ICD-10-CM | POA: Diagnosis not present

## 2016-06-14 MED ORDER — ACETAMINOPHEN 325 MG PO TABS
ORAL_TABLET | ORAL | Status: AC
  Filled 2016-06-14: qty 2

## 2016-06-14 MED ORDER — SODIUM CHLORIDE 0.9 % IV SOLN
INTRAVENOUS | Status: DC
  Administered 2016-06-14: 15:00:00 via INTRAVENOUS

## 2016-06-14 MED ORDER — METHYLPREDNISOLONE SODIUM SUCC 125 MG IJ SOLR
INTRAMUSCULAR | Status: AC
  Filled 2016-06-14: qty 2

## 2016-06-14 MED ORDER — METHYLPREDNISOLONE SODIUM SUCC 125 MG IJ SOLR
60.0000 mg | INTRAMUSCULAR | Status: AC
  Administered 2016-06-14: 60 mg via INTRAVENOUS

## 2016-06-14 MED ORDER — SODIUM CHLORIDE 0.9 % IV SOLN
750.0000 mg | Freq: Once | INTRAVENOUS | Status: AC
  Administered 2016-06-14: 750 mg via INTRAVENOUS
  Filled 2016-06-14: qty 15

## 2016-06-14 MED ORDER — ACETAMINOPHEN 325 MG PO TABS
650.0000 mg | ORAL_TABLET | Freq: Once | ORAL | Status: AC
  Administered 2016-06-14: 650 mg via ORAL

## 2016-06-14 MED ORDER — DIPHENHYDRAMINE HCL 50 MG/ML IJ SOLN
25.0000 mg | Freq: Once | INTRAMUSCULAR | Status: AC
  Administered 2016-06-14: 25 mg via INTRAVENOUS

## 2016-06-14 MED ORDER — DIPHENHYDRAMINE HCL 50 MG/ML IJ SOLN
INTRAMUSCULAR | Status: AC
  Filled 2016-06-14: qty 1

## 2016-06-14 NOTE — Progress Notes (Signed)
1455 patient states she feels dizzy. Reports she has never had any trouble with iron infusions in past. Infusion stopped. VSS. No other adverse reactions noted.Reported symptoms to T.Kefalas,PA. Solumedrol 60 mg IV push ordered and given. Orders to restart iron after solumedrol. 1505 restarted iron infusion. 1510 patient states" I think I will be alright I just feel weird". Stopped iron infusion. MD notified. Discontinue infusion. 1515 Sabina Beavers order for tylenol and benadryl. Given as ordered. 1535 patient states "I feel ok, just different". VSS. No other symptoms noted/reported. 1545 patient states she feels ok just drowsy from benadryl. 1600 patient's husband at side. She states she feels ok now just drowsy. Informed husband of suspected infusion reaction. Patient is ok for discharge home. Stable on discharge home with husband via wheelchair.

## 2016-06-14 NOTE — Patient Instructions (Signed)
[place] at [facility] Discharge Instructions  RECOMMENDATIONS MADE BY THE CONSULTANT AND ANY TEST RESULTS WILL BE SENT TO YOUR REFERRING PHYSICIAN.  Injectafer infusion discontinued for suspected adverse reaction. Lab work for follow up as scheduled. If you feel you need lab work sooner than 07/27/16 please call clinic and we will schedule sooner.  Thank you for choosing [place] at [facility] to provide your oncology and hematology care.  To afford each patient quality time with our provider, please arrive at least 15 minutes before your scheduled appointment time.    If you have a lab appointment with the [facility] please come in thru the  Main Entrance and check in at the main information desk  You need to re-schedule your appointment should you arrive 10 or more minutes late.  We strive to give you quality time with our providers, and arriving late affects you and other patients whose appointments are after yours.  Also, if you no show three or more times for appointments you may be dismissed from the clinic at the providers discretion.     Again, thank you for choosing [facility].  Our hope is that these requests will decrease the amount of time that you wait before being seen by our physicians.       _____________________________________________________________  Should you have questions after your visit to [facility], please contact our office at [phone] between the hours of 8:30 a.m. and 4:30 p.m.  Voicemails left after 4:30 p.m. will not be returned until the following business day.  For prescription refill requests, have your pharmacy contact our office.       Resources For Cancer Patients and their Caregivers ? American Cancer Society: Can assist with transportation, wigs, general needs, runs Look Good Feel Better.        703-786-6570 ? Cancer Care: Provides financial assistance,  online support groups, medication/co-pay assistance.  1-800-813-HOPE (217) 562-2488) ? [place] Assists [place] cancer patients and their families through emotional , educational and financial support.  418 590 1524 ? [place] DSS Where to apply for food stamps, Medicaid and utility assistance. 859-288-2019 ? RCATS: Transportation to medical appointments. 4043180015 ? Social Security Administration: May apply for disability if have a Stage IV cancer. 6176531773 970-605-1041 ? [employer], Disability and Transit Services: Assists with nutrition, care and transit needs. [place] Support Programs: @10RELATIVEDAYS @ > Cancer Support Group  2nd Tuesday of the month 1pm-2pm, Journey Room  > Creative Journey  3rd Tuesday of the month 1130am-1pm, Journey Room  > Look Good Feel Better  1st Wednesday of the month 10am-12 noon, Journey Room (Call [place] to register [phone])

## 2016-06-15 ENCOUNTER — Other Ambulatory Visit (HOSPITAL_COMMUNITY): Payer: Self-pay | Admitting: Pharmacist

## 2016-07-27 ENCOUNTER — Encounter (HOSPITAL_COMMUNITY): Payer: Medicare Other | Attending: Oncology

## 2016-07-27 DIAGNOSIS — D500 Iron deficiency anemia secondary to blood loss (chronic): Secondary | ICD-10-CM | POA: Diagnosis not present

## 2016-07-27 LAB — CBC WITH DIFFERENTIAL/PLATELET
Basophils Absolute: 0 10*3/uL (ref 0.0–0.1)
Basophils Relative: 1 %
EOS ABS: 0.1 10*3/uL (ref 0.0–0.7)
EOS PCT: 3 %
HCT: 35.4 % — ABNORMAL LOW (ref 36.0–46.0)
Hemoglobin: 11.3 g/dL — ABNORMAL LOW (ref 12.0–15.0)
LYMPHS ABS: 1.4 10*3/uL (ref 0.7–4.0)
LYMPHS PCT: 32 %
MCH: 25.9 pg — AB (ref 26.0–34.0)
MCHC: 31.9 g/dL (ref 30.0–36.0)
MCV: 81.2 fL (ref 78.0–100.0)
MONO ABS: 0.3 10*3/uL (ref 0.1–1.0)
MONOS PCT: 7 %
Neutro Abs: 2.6 10*3/uL (ref 1.7–7.7)
Neutrophils Relative %: 57 %
Platelets: 251 10*3/uL (ref 150–400)
RBC: 4.36 MIL/uL (ref 3.87–5.11)
RDW: 17.4 % — AB (ref 11.5–15.5)
WBC: 4.5 10*3/uL (ref 4.0–10.5)

## 2016-07-27 LAB — COMPREHENSIVE METABOLIC PANEL
ALT: 31 U/L (ref 14–54)
ANION GAP: 8 (ref 5–15)
AST: 33 U/L (ref 15–41)
Albumin: 4.2 g/dL (ref 3.5–5.0)
Alkaline Phosphatase: 68 U/L (ref 38–126)
BUN: 10 mg/dL (ref 6–20)
CO2: 28 mmol/L (ref 22–32)
Calcium: 9.4 mg/dL (ref 8.9–10.3)
Chloride: 102 mmol/L (ref 101–111)
Creatinine, Ser: 0.63 mg/dL (ref 0.44–1.00)
Glucose, Bld: 136 mg/dL — ABNORMAL HIGH (ref 65–99)
POTASSIUM: 3.9 mmol/L (ref 3.5–5.1)
SODIUM: 138 mmol/L (ref 135–145)
Total Bilirubin: 0.9 mg/dL (ref 0.3–1.2)
Total Protein: 7.5 g/dL (ref 6.5–8.1)

## 2016-07-27 LAB — IRON AND TIBC
IRON: 58 ug/dL (ref 28–170)
SATURATION RATIOS: 12 % (ref 10.4–31.8)
TIBC: 486 ug/dL — AB (ref 250–450)
UIBC: 428 ug/dL

## 2016-07-27 LAB — FERRITIN: FERRITIN: 15 ng/mL (ref 11–307)

## 2016-07-28 ENCOUNTER — Other Ambulatory Visit (HOSPITAL_COMMUNITY): Payer: Self-pay | Admitting: Oncology

## 2016-07-28 DIAGNOSIS — D508 Other iron deficiency anemias: Secondary | ICD-10-CM

## 2016-08-01 ENCOUNTER — Other Ambulatory Visit (HOSPITAL_COMMUNITY): Payer: Medicare Other

## 2016-08-01 ENCOUNTER — Ambulatory Visit (HOSPITAL_COMMUNITY): Payer: Medicare Other

## 2016-08-07 DIAGNOSIS — H04123 Dry eye syndrome of bilateral lacrimal glands: Secondary | ICD-10-CM | POA: Diagnosis not present

## 2016-08-07 DIAGNOSIS — D3131 Benign neoplasm of right choroid: Secondary | ICD-10-CM | POA: Diagnosis not present

## 2016-08-07 DIAGNOSIS — H401132 Primary open-angle glaucoma, bilateral, moderate stage: Secondary | ICD-10-CM | POA: Diagnosis not present

## 2016-08-07 DIAGNOSIS — H43813 Vitreous degeneration, bilateral: Secondary | ICD-10-CM | POA: Diagnosis not present

## 2016-08-07 DIAGNOSIS — Z961 Presence of intraocular lens: Secondary | ICD-10-CM | POA: Diagnosis not present

## 2016-08-22 DIAGNOSIS — Z79899 Other long term (current) drug therapy: Secondary | ICD-10-CM | POA: Diagnosis not present

## 2016-08-22 DIAGNOSIS — E785 Hyperlipidemia, unspecified: Secondary | ICD-10-CM | POA: Diagnosis not present

## 2016-08-22 DIAGNOSIS — G609 Hereditary and idiopathic neuropathy, unspecified: Secondary | ICD-10-CM | POA: Diagnosis not present

## 2016-08-22 DIAGNOSIS — E119 Type 2 diabetes mellitus without complications: Secondary | ICD-10-CM | POA: Diagnosis not present

## 2016-09-01 ENCOUNTER — Other Ambulatory Visit (HOSPITAL_COMMUNITY): Payer: Self-pay | Admitting: *Deleted

## 2016-09-01 DIAGNOSIS — D509 Iron deficiency anemia, unspecified: Secondary | ICD-10-CM

## 2016-09-04 ENCOUNTER — Encounter (HOSPITAL_COMMUNITY): Payer: Medicare Other | Attending: Oncology

## 2016-09-04 DIAGNOSIS — D500 Iron deficiency anemia secondary to blood loss (chronic): Secondary | ICD-10-CM | POA: Insufficient documentation

## 2016-09-04 DIAGNOSIS — D509 Iron deficiency anemia, unspecified: Secondary | ICD-10-CM

## 2016-09-04 LAB — COMPREHENSIVE METABOLIC PANEL
ALBUMIN: 3.9 g/dL (ref 3.5–5.0)
ALT: 22 U/L (ref 14–54)
ANION GAP: 7 (ref 5–15)
AST: 27 U/L (ref 15–41)
Alkaline Phosphatase: 68 U/L (ref 38–126)
BUN: 11 mg/dL (ref 6–20)
CALCIUM: 9.2 mg/dL (ref 8.9–10.3)
CO2: 27 mmol/L (ref 22–32)
Chloride: 105 mmol/L (ref 101–111)
Creatinine, Ser: 0.58 mg/dL (ref 0.44–1.00)
GFR calc non Af Amer: >60 mL/min (ref >60)
GLUCOSE: 115 mg/dL — AB (ref 65–99)
POTASSIUM: 4 mmol/L (ref 3.5–5.1)
SODIUM: 139 mmol/L (ref 135–145)
TOTAL PROTEIN: 7.4 g/dL (ref 6.5–8.1)
Total Bilirubin: 0.9 mg/dL (ref 0.3–1.2)

## 2016-09-04 LAB — CBC WITH DIFFERENTIAL/PLATELET
Basophils Absolute: 0 10*3/uL (ref 0.0–0.1)
Basophils Relative: 1 %
EOS ABS: 0.2 10*3/uL (ref 0.0–0.7)
Eosinophils Relative: 3 %
HCT: 32.6 % — ABNORMAL LOW (ref 36.0–46.0)
Hemoglobin: 10.4 g/dL — ABNORMAL LOW (ref 12.0–15.0)
LYMPHS ABS: 1.4 10*3/uL (ref 0.7–4.0)
Lymphocytes Relative: 23 %
MCH: 25.2 pg — AB (ref 26.0–34.0)
MCHC: 31.9 g/dL (ref 30.0–36.0)
MCV: 79.1 fL (ref 78.0–100.0)
MONO ABS: 0.5 10*3/uL (ref 0.1–1.0)
MONOS PCT: 9 %
NEUTROS PCT: 64 %
Neutro Abs: 4 10*3/uL (ref 1.7–7.7)
PLATELETS: 282 10*3/uL (ref 150–400)
RBC: 4.12 MIL/uL (ref 3.87–5.11)
RDW: 16.1 % — AB (ref 11.5–15.5)
WBC: 6.2 10*3/uL (ref 4.0–10.5)

## 2016-09-04 LAB — IRON AND TIBC
Iron: 40 ug/dL (ref 28–170)
Saturation Ratios: 8 % — ABNORMAL LOW (ref 10.4–31.8)
TIBC: 512 ug/dL — AB (ref 250–450)
UIBC: 472 ug/dL

## 2016-09-04 NOTE — Progress Notes (Signed)
Please call patient to let her know her iron levels are low and I'm setting her up for 2 doses of feraheme. Ask her when she wants to get them scheduled.

## 2016-09-06 ENCOUNTER — Encounter (HOSPITAL_COMMUNITY): Payer: Self-pay | Admitting: Oncology

## 2016-09-06 ENCOUNTER — Encounter (HOSPITAL_BASED_OUTPATIENT_CLINIC_OR_DEPARTMENT_OTHER): Payer: Medicare Other

## 2016-09-06 ENCOUNTER — Encounter (HOSPITAL_BASED_OUTPATIENT_CLINIC_OR_DEPARTMENT_OTHER): Payer: Medicare Other | Admitting: Oncology

## 2016-09-06 VITALS — BP 118/55 | HR 68 | Temp 97.4°F | Resp 18 | Wt 207.2 lb

## 2016-09-06 DIAGNOSIS — K9089 Other intestinal malabsorption: Secondary | ICD-10-CM | POA: Diagnosis not present

## 2016-09-06 DIAGNOSIS — D508 Other iron deficiency anemias: Secondary | ICD-10-CM

## 2016-09-06 DIAGNOSIS — K922 Gastrointestinal hemorrhage, unspecified: Secondary | ICD-10-CM | POA: Diagnosis not present

## 2016-09-06 DIAGNOSIS — D500 Iron deficiency anemia secondary to blood loss (chronic): Secondary | ICD-10-CM

## 2016-09-06 DIAGNOSIS — K625 Hemorrhage of anus and rectum: Secondary | ICD-10-CM

## 2016-09-06 MED ORDER — SODIUM CHLORIDE 0.9 % IV SOLN
510.0000 mg | Freq: Once | INTRAVENOUS | Status: AC
  Administered 2016-09-06: 510 mg via INTRAVENOUS
  Filled 2016-09-06: qty 17

## 2016-09-06 MED ORDER — SODIUM CHLORIDE 0.9 % IV SOLN
Freq: Once | INTRAVENOUS | Status: AC
  Administered 2016-09-06: 11:00:00 via INTRAVENOUS

## 2016-09-06 NOTE — Patient Instructions (Signed)
[place] at [facility] Discharge Instructions  RECOMMENDATIONS MADE BY THE CONSULTANT AND ANY TEST RESULTS WILL BE SENT TO YOUR REFERRING PHYSICIAN.  You received your Feraheme infusion today. Follow up next month for another dose of iron.   Thank you for choosing [place] at [facility] to provide your oncology and hematology care.  To afford each patient quality time with our provider, please arrive at least 15 minutes before your scheduled appointment time.    If you have a lab appointment with the [facility] please come in thru the  Main Entrance and check in at the main information desk  You need to re-schedule your appointment should you arrive 10 or more minutes late.  We strive to give you quality time with our providers, and arriving late affects you and other patients whose appointments are after yours.  Also, if you no show three or more times for appointments you may be dismissed from the clinic at the providers discretion.     Again, thank you for choosing [facility].  Our hope is that these requests will decrease the amount of time that you wait before being seen by our physicians.       _____________________________________________________________  Should you have questions after your visit to [facility], please contact our office at [phone] between the hours of 8:30 a.m. and 4:30 p.m.  Voicemails left after 4:30 p.m. will not be returned until the following business day.  For prescription refill requests, have your pharmacy contact our office.       Resources For Cancer Patients and their Caregivers ? American Cancer Society: Can assist with transportation, wigs, general needs, runs Look Good Feel Better.        304-352-7968 ? Cancer Care: Provides financial assistance, online support groups, medication/co-pay assistance.  1-800-813-HOPE 904-067-2265) ? [facility] Assists [place] cancer patients and their families through emotional , educational and financial support.  407-181-7255 ? [place] DSS Where to apply for food stamps, Medicaid and utility assistance. 972-103-3181 ? RCATS: Transportation to medical appointments. 773 254 4349 ? Social Security Administration: May apply for disability if have a Stage IV cancer. 281-003-8033 312-871-0551 ? [employer], Disability and Transit Services: Assists with nutrition, care and transit needs. [place] Support Programs: @10RELATIVEDAYS @ > Cancer Support Group  2nd Tuesday of the month 1pm-2pm, Journey Room  > Creative Journey  3rd Tuesday of the month 1130am-1pm, Journey Room  > Look Good Feel Better  1st Wednesday of the month 10am-12 noon, Journey Room (Call [place] to register [phone])

## 2016-09-06 NOTE — Progress Notes (Signed)
Patient tolerated infusion without any problems. Patient discharged ambulatory and in stable condition.  

## 2016-09-07 NOTE — Progress Notes (Signed)
[facility] PROGRESS NOTE  Patient Care Team: Asencion Noble, MD as PCP - General (Internal Medicine) Gala Romney Cristopher Estimable, MD as Consulting Physician (Gastroenterology)  CHIEF COMPLAINTS/PURPOSE OF CONSULTATION:  Iron deficiency anemia Last Colonoscopy 11/01/2012 with diverticulosis CBC on 04/28/2013 shows a hemoglobin of 11.3, hematocrit of 35.9, and MCV of 75.7. RDW is elevated at 19.3 CBC dated 03/19/2013 shows a hemoglobin of 9.7, hematocrit of 30.2, MCV of 73.3. Ferritin level of 13 ng/ml on 04/2014 EGD 10/26/2014 with erosive esophagitis  HISTORY OF PRESENTING ILLNESS:  Debra Harris 66 y.o. female returns for follow up of recurrent iron deficiency.   She is doing well today. She states she continues to have intermittent rectal bleeding. Lab work performed on 09/04/16 demonstrated a hemoglobin 10.4 g deciliter, hematocrit 32.6%, MCV 79.1, iron studies demonstrated serum iron 40, TIBC 512, percent saturation 8%. Patient states she feels fatigued. She denies any chest pain or shortness of breath or abdominal pain. No source has been found for her rectal bleeding.  MEDICAL HISTORY:  Past Medical History:  Diagnosis Date  . Adenomatous colon polyp   . Allergic rhinitis   . Arthritis   . Diabetes mellitus   . Diverticulosis   . External hemorrhoids   . Fibromyalgia   . GERD (gastroesophageal reflux disease)   . Glaucoma   . Hemorrhoids    grade 3   . IDA (iron deficiency anemia)   . Iron deficiency anemia 06/05/2014  . Low tension glaucoma   . Neuropathy, leg   . Ovarian cyst 11/20/00  . Sleep apnea    CPAP  . Sliding hiatal hernia     SURGICAL HISTORY: Past Surgical History:  Procedure Laterality Date  . CARPAL TUNNEL RELEASE Bilateral   . CATARACT EXTRACTION     bilateral  . CHOLECYSTECTOMY    . COLONOSCOPY  2005   Small external hemorrhoids, single sigmoid colon diverticulum, small polyp at the hepatic flexure focally adenomatous.  . COLONOSCOPY  2010   Dr.  Gala Romney, Versed 4/Demerol 75. Pancolonic diverticula, external hemorrhoids. Colonoscopy every 5 years for history of adenomatous polyps  . COLONOSCOPY  2014   Dr. Olevia Perches: Propofol. mild diverticulosis in the sigmoid colon, random colon biopsies benign.  . COLONOSCOPY WITH PROPOFOL N/A 09/13/2015   Procedure: COLONOSCOPY WITH PROPOFOL;  Surgeon: Daneil Dolin, MD;  Location: [facility];  Service: Endoscopy;  Laterality: N/A;  1030  . ESOPHAGEAL DILATION N/A 10/26/2014   Procedure: ESOPHAGEAL DILATION [place];  Surgeon: Daneil Dolin, MD;  Location: [facility];  Service: Endoscopy;  Laterality: N/A;  . ESOPHAGOGASTRODUODENOSCOPY  2010   Dr. Gala Romney, distal esophageal erosions, Venia Minks dilator passed. Comment small hiatal hernia, patulous EG junction.  . ESOPHAGOGASTRODUODENOSCOPY (EGD) WITH PROPOFOL N/A 10/26/2014   Dr.Rourk- distal esophageal erosions c/w mild erosive reflux esophagitis. hiatal hernia, o/w normal EGD  . ESOPHAGOGASTRODUODENOSCOPY (EGD) WITH PROPOFOL N/A 09/13/2015   Procedure: ESOPHAGOGASTRODUODENOSCOPY (EGD) WITH PROPOFOL;  Surgeon: Daneil Dolin, MD;  [facility]: [facility];  Service: Endoscopy;  Laterality: N/A;  . GIVENS CAPSULE STUDY N/A 10/26/2014   couple of mucosal breaks/erosions but essentially unremarkable small bowel study  . HEMORRHOID BANDING    . KNEE SURGERY     right-arthroscopy  . OOPHORECTOMY  age 25s  . PARTIAL HYSTERECTOMY  age 26s  . RECTAL EXAM UNDER ANESTHESIA N/A 10/26/2014   circumferential grade 3 hemoorrhoids easily reducible  . thumb surger Bilateral    trigger thumb    SOCIAL HISTORY:  Social History   Social History  . Marital status: Married    Spouse name: J. Orland Dec  . Number of children: Y  . Years of education: N/A   Occupational History  . AUDIOLOGIST    Social History Main Topics  . Smoking status: Never Smoker  . Smokeless tobacco: Never Used     Comment: Pt reports tried once in 7th grade//ms  . Alcohol use 0.6 oz/week     1 Glasses of wine per week     Comment: rare  . Drug use: No  . Sexual activity: No   Other Topics Concern  . Not on file   Social History Narrative   3 children & married to orthopedist. 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      3 children and one grandchild. She has been married for over 40 years. She is a never smoker. She does not drink alcohol. She still is very active and works outside the home.  FAMILY HISTORY: Family History  Problem Relation Age of Onset  . Ulcers Mother   . Glaucoma Mother   . Heart disease Mother   . Glaucoma Maternal Grandmother   . Heart disease Maternal Grandmother   . Heart disease Maternal Grandfather   . Colon cancer Neg Hx    indicated that the status of her mother is unknown. She indicated that the status of her maternal grandmother is unknown. She indicated that the status of her maternal grandfather is unknown. She indicated that the status of her neg hx is unknown.   Father died when the patient was 24 from suicide. Her mother died at 45 from an MI. She had glaucoma that caused blindness. She has no siblings.   ALLERGIES:  is allergic to injectafer [ferric carboxymaltose].  MEDICATIONS:  Current Outpatient Prescriptions  Medication Sig Dispense Refill  . Acetylcarnitine HCl (ACETYL L-CARNITINE) 500 MG CAPS Take 1 capsule by mouth daily.    . [place]-Glucosamine-Vit D (RA GLUCOSAMINE & VITAMIN D3 PO) Take 1 tablet by mouth daily.    . cetirizine (ZYRTEC) 10 MG tablet Take 10 mg by mouth daily.     . Coenzyme Q10 (CO Q 10 PO) Take 1 tablet by mouth daily.    . Cyanocobalamin (VITAMIN B-12) 5000 MCG SUBL Place 1 tablet under the tongue daily.    Marland Kitchen HYDROcodone-acetaminophen (NORCO) 5-325 MG tablet Take 1 tablet by mouth every 6 (six) hours as needed for moderate pain. 30 tablet 0  . Magnesium 400 MG TABS Take 1 tablet by mouth daily.    . metFORMIN (GLUCOPHAGE) 500 MG tablet Take 1,000 mg by mouth daily with breakfast.     .  Multiple Vitamins-Minerals (CENTRUM SILVER 50+WOMEN PO) Take 1 tablet by mouth daily.    . Probiotic Product (TRUNATURE DIGESTIVE PROBIOTIC PO) Take 1 capsule by mouth daily. Reported on 08/20/2015    . TURMERIC PO Take 1 tablet by mouth 2 (two) times daily.    Marland Kitchen UNKNOWN TO PATIENT CPAP machine    . Wheat Dextrin (BENEFIBER PO) Take by mouth.     No current facility-administered medications for this visit.    Review of Systems  Constitutional: Negative for malaise/fatigue.  HENT: Negative.   Eyes: Negative.   Respiratory: Negative for shortness of breath.   Cardiovascular: Negative for chest pain and leg swelling.  Gastrointestinal: Positive for blood in stool. Negative for abdominal pain and constipation.  Genitourinary: Negative.   Musculoskeletal: Negative for joint pain.  Skin: Negative.  Neurological: Negative for tingling.  Endo/Heme/Allergies: Negative.   Psychiatric/Behavioral: Negative.   All other systems reviewed and are negative. 14 point review of systems was performed and is negative except as detailed under history of present illness and above  PHYSICAL EXAMINATION: ECOG PERFORMANCE STATUS: 0 - Asymptomatic Vitals reviewed.   Physical Exam  Constitutional: She is oriented to person, place, and time and well-developed, well-nourished, and in no distress.  HENT:  Head: Normocephalic and atraumatic.  Eyes: Pupils are equal, round, and reactive to light. EOM are normal.  Neck: Normal range of motion. Neck supple.  Cardiovascular: Normal rate, regular rhythm and normal heart sounds.   Pulmonary/Chest: Effort normal and breath sounds normal.  Abdominal: Soft. Bowel sounds are normal.  Musculoskeletal: Normal range of motion.  Neurological: She is alert and oriented to person, place, and time. Gait normal.  Skin: Skin is warm and dry.  Nursing note and vitals reviewed.  LABORATORY DATA:  I have reviewed the data as listed Lab Results  Component Value Date   WBC  6.2 09/04/2016   HGB 10.4 (L) 09/04/2016   HCT 32.6 (L) 09/04/2016   MCV 79.1 09/04/2016   PLT 282 09/04/2016   CMP Latest Ref Rng & Units 09/04/2016 07/27/2016 09/10/2015  Glucose 65 - 99 mg/dL 115(H) 136(H) 149(H)  BUN 6 - 20 mg/dL 11 10 11   Creatinine 0.44 - 1.00 mg/dL 0.58 0.63 0.57  Sodium 135 - 145 mmol/L 139 138 137  Potassium 3.5 - 5.1 mmol/L 4.0 3.9 3.9  Chloride 101 - 111 mmol/L 105 102 106  CO2 22 - 32 mmol/L 27 28 25   Calcium 8.9 - 10.3 mg/dL 9.2 9.4 9.0  Total Protein 6.5 - 8.1 g/dL 7.4 7.5 -  Total Bilirubin 0.3 - 1.2 mg/dL 0.9 0.9 -  Alkaline Phos 38 - 126 U/L 68 68 -  AST 15 - 41 U/L 27 33 -  ALT 14 - 54 U/L 22 31 -     Chemistry      Component Value Date/Time   NA 139 09/04/2016 0958   K 4.0 09/04/2016 0958   CL 105 09/04/2016 0958   CO2 27 09/04/2016 0958   BUN 11 09/04/2016 0958   CREATININE 0.58 09/04/2016 0958      Component Value Date/Time   CALCIUM 9.2 09/04/2016 0958   ALKPHOS 68 09/04/2016 0958   AST 27 09/04/2016 0958   ALT 22 09/04/2016 0958   BILITOT 0.9 09/04/2016 0958     ASSESSMENT & PLAN:  Iron deficiency anemia Iron malabsorption Chronic GI related blood loss questionable secondary to internal hemorrhoids Fatigue secondary to iron deficiency  Patient has persistent rectal bleeding. She is scheduled for 1 dose of Injectafer today. She is undergoing house renovations at this time so she say that she would like to hold off on her next dose of Injectafer until a month later, therefore we'll schedule for that time.  Continue f/u with GI for rectal bleeding.   She will return for follow up in 3 months with CBC, CMP, iron studies.   All questions were answered. The patient knows to call the clinic with any problems, questions or concerns.      This note was electronically signed. Twana First, M.D.

## 2016-10-04 ENCOUNTER — Ambulatory Visit (HOSPITAL_COMMUNITY): Payer: Medicare Other

## 2016-11-09 ENCOUNTER — Encounter: Payer: Self-pay | Admitting: Orthopedic Surgery

## 2016-11-09 ENCOUNTER — Other Ambulatory Visit: Payer: Self-pay | Admitting: Orthopedic Surgery

## 2016-11-09 ENCOUNTER — Ambulatory Visit (INDEPENDENT_AMBULATORY_CARE_PROVIDER_SITE_OTHER): Payer: Medicare Other | Admitting: Orthopedic Surgery

## 2016-11-09 DIAGNOSIS — G8929 Other chronic pain: Secondary | ICD-10-CM

## 2016-11-09 DIAGNOSIS — M25561 Pain in right knee: Principal | ICD-10-CM

## 2016-11-09 DIAGNOSIS — M25562 Pain in left knee: Secondary | ICD-10-CM

## 2016-11-09 DIAGNOSIS — M1712 Unilateral primary osteoarthritis, left knee: Secondary | ICD-10-CM

## 2016-11-09 DIAGNOSIS — M1711 Unilateral primary osteoarthritis, right knee: Secondary | ICD-10-CM

## 2016-11-09 MED ORDER — HYDROCODONE-ACETAMINOPHEN 5-325 MG PO TABS: 1.0000 | ORAL_TABLET | Freq: Four times a day (QID) | ORAL | 0 refills | Status: DC | PRN

## 2016-11-09 NOTE — Progress Notes (Signed)
Routine office visit   Chief Complaint  Patient presents with  . Knee Problem    1 st of a series of injections right and left knee    The right knee and the left knee look fine for injection   Verbal consent has been given for bilateral knee injections   Obligatory time out was done for the right knee   The knee was prepped with alcohol and I sprayed it with ethyl chloride   Injection of Supartz right knee , no issues   I repeated the same process for the left knee   Encounter Diagnoses  Name Primary?  Marland Kitchen Arthritis of right knee Yes  . Chronic pain of left knee   . Chronic pain of right knee   . Primary osteoarthritis of left knee     Fu in 1 week for 2nd of 3 injections

## 2016-11-09 NOTE — Progress Notes (Signed)
[id number]  Right knee   DX primary osteoarthritis   Procedure note right knee injection verbal consent was obtained to inject right knee joint  Timeout was completed to confirm the site of injection  The medications used supartz FX 25mg /2.5 ml  Anesthesia was provided by ethyl chloride and the skin was prepped with alcohol.  After cleaning the skin with alcohol a 20-gauge needle was used to inject the right knee joint. There were no complications. A sterile bandage was applied.  Return next weds

## 2016-11-13 ENCOUNTER — Ambulatory Visit: Payer: Medicare Other | Admitting: Orthopedic Surgery

## 2016-11-14 ENCOUNTER — Ambulatory Visit (INDEPENDENT_AMBULATORY_CARE_PROVIDER_SITE_OTHER): Payer: Medicare Other | Admitting: Orthopedic Surgery

## 2016-11-14 VITALS — BP 149/96 | Ht 65.0 in | Wt 214.0 lb

## 2016-11-14 DIAGNOSIS — M1711 Unilateral primary osteoarthritis, right knee: Secondary | ICD-10-CM

## 2016-11-14 DIAGNOSIS — M1712 Unilateral primary osteoarthritis, left knee: Secondary | ICD-10-CM

## 2016-11-14 DIAGNOSIS — G8929 Other chronic pain: Secondary | ICD-10-CM

## 2016-11-14 DIAGNOSIS — M25562 Pain in left knee: Secondary | ICD-10-CM

## 2016-11-14 DIAGNOSIS — M25561 Pain in right knee: Secondary | ICD-10-CM

## 2016-11-14 NOTE — Progress Notes (Signed)
Procedure note for injection of hyaluronic acid   Diagnosis osteoarthritis of the knee right and left  Verbal consent was obtained to inject the knee with HYALURONIC ACID . Timeout was completed to confirm the injection site as the right   Knee  Ethyl chloride spray was used for anesthesia Alcohol was used to prep the skin. The infrapatellar lateral portal was used as an injection site and 1 vial of hyaluronic acid  was injected into the knee  SUPARTZ  No complications were noted  Left knee hyaluronic acid injection. Timeout completed confirm site of injection left knee  Ethyl chloride was used for anesthesia Alcohol was used to prep the skin. The infrapatellar lateral portal was used as an injection site and 1 vial of hyaluronic acid  was injected into the knee  SUPARTZ  No complications were noted  Encounter Diagnoses  Name Primary?  Marland Kitchen Arthritis of right knee Yes  . Chronic pain of right knee   . Chronic pain of left knee   . Primary osteoarthritis of left knee

## 2016-11-15 ENCOUNTER — Ambulatory Visit: Payer: Medicare Other | Admitting: Orthopedic Surgery

## 2016-11-17 ENCOUNTER — Ambulatory Visit: Payer: Medicare Other | Admitting: Orthopedic Surgery

## 2016-11-20 ENCOUNTER — Ambulatory Visit (INDEPENDENT_AMBULATORY_CARE_PROVIDER_SITE_OTHER): Payer: Medicare Other | Admitting: Orthopedic Surgery

## 2016-11-20 ENCOUNTER — Encounter: Payer: Self-pay | Admitting: Orthopedic Surgery

## 2016-11-20 VITALS — BP 131/74 | HR 71 | Ht 65.0 in | Wt 214.0 lb

## 2016-11-20 DIAGNOSIS — M1711 Unilateral primary osteoarthritis, right knee: Secondary | ICD-10-CM | POA: Diagnosis not present

## 2016-11-20 DIAGNOSIS — M25562 Pain in left knee: Secondary | ICD-10-CM

## 2016-11-20 DIAGNOSIS — G8929 Other chronic pain: Secondary | ICD-10-CM

## 2016-11-20 DIAGNOSIS — M25561 Pain in right knee: Secondary | ICD-10-CM

## 2016-11-20 DIAGNOSIS — M1712 Unilateral primary osteoarthritis, left knee: Secondary | ICD-10-CM

## 2016-11-20 NOTE — Progress Notes (Signed)
Procedure note for injection of hyaluronic acid    Diagnosis osteoarthritis of the knee right and left   Verbal consent was obtained to inject the knee with HYALURONIC ACID . Timeout was completed to confirm the injection site as the right   Knee   Ethyl chloride spray was used for anesthesia Alcohol was used to prep the skin. The infrapatellar lateral portal was used as an injection site and 1 vial of hyaluronic acid  was injected into the knee   SUPARTZ   No complications were noted   Left knee hyaluronic acid injection. Timeout completed confirm site of injection left knee   Ethyl chloride was used for anesthesia Alcohol was used to prep the skin. The infrapatellar lateral portal was used as an injection site and 1 vial of hyaluronic acid  was injected into the knee   [place]  Encounter Diagnoses  Name Primary?  Marland Kitchen Arthritis of right knee Yes  . Chronic pain of left knee   . Chronic pain of right knee   . Primary osteoarthritis of left knee

## 2016-11-29 ENCOUNTER — Encounter (HOSPITAL_COMMUNITY): Payer: Medicare Other | Attending: Oncology

## 2016-11-29 DIAGNOSIS — Z86010 Personal history of colonic polyps: Secondary | ICD-10-CM | POA: Diagnosis not present

## 2016-11-29 DIAGNOSIS — K219 Gastro-esophageal reflux disease without esophagitis: Secondary | ICD-10-CM | POA: Insufficient documentation

## 2016-11-29 DIAGNOSIS — M797 Fibromyalgia: Secondary | ICD-10-CM | POA: Diagnosis not present

## 2016-11-29 DIAGNOSIS — D508 Other iron deficiency anemias: Secondary | ICD-10-CM

## 2016-11-29 DIAGNOSIS — D509 Iron deficiency anemia, unspecified: Secondary | ICD-10-CM | POA: Insufficient documentation

## 2016-11-29 DIAGNOSIS — E1140 Type 2 diabetes mellitus with diabetic neuropathy, unspecified: Secondary | ICD-10-CM | POA: Insufficient documentation

## 2016-11-29 DIAGNOSIS — R5383 Other fatigue: Secondary | ICD-10-CM | POA: Diagnosis not present

## 2016-11-29 DIAGNOSIS — G8929 Other chronic pain: Secondary | ICD-10-CM | POA: Insufficient documentation

## 2016-11-29 DIAGNOSIS — K909 Intestinal malabsorption, unspecified: Secondary | ICD-10-CM | POA: Insufficient documentation

## 2016-11-29 DIAGNOSIS — G4730 Sleep apnea, unspecified: Secondary | ICD-10-CM | POA: Insufficient documentation

## 2016-11-29 DIAGNOSIS — Z7984 Long term (current) use of oral hypoglycemic drugs: Secondary | ICD-10-CM | POA: Insufficient documentation

## 2016-11-29 LAB — FERRITIN: FERRITIN: 13 ng/mL (ref 11–307)

## 2016-11-29 LAB — COMPREHENSIVE METABOLIC PANEL
ALK PHOS: 70 U/L (ref 38–126)
ALT: 28 U/L (ref 14–54)
AST: 25 U/L (ref 15–41)
Albumin: 4.2 g/dL (ref 3.5–5.0)
Anion gap: 8 (ref 5–15)
BILIRUBIN TOTAL: 0.6 mg/dL (ref 0.3–1.2)
BUN: 11 mg/dL (ref 6–20)
CO2: 26 mmol/L (ref 22–32)
CREATININE: 0.6 mg/dL (ref 0.44–1.00)
Calcium: 9.3 mg/dL (ref 8.9–10.3)
Chloride: 103 mmol/L (ref 101–111)
GFR calc Af Amer: >60 mL/min (ref >60)
GLUCOSE: 98 mg/dL (ref 65–99)
Potassium: 3.9 mmol/L (ref 3.5–5.1)
Sodium: 137 mmol/L (ref 135–145)
TOTAL PROTEIN: 7.9 g/dL (ref 6.5–8.1)

## 2016-11-29 LAB — CBC WITH DIFFERENTIAL/PLATELET
BASOS ABS: 0 10*3/uL (ref 0.0–0.1)
Basophils Relative: 0 %
Eosinophils Absolute: 0.2 10*3/uL (ref 0.0–0.7)
Eosinophils Relative: 3 %
HEMATOCRIT: 39.2 % (ref 36.0–46.0)
HEMOGLOBIN: 12.4 g/dL (ref 12.0–15.0)
LYMPHS PCT: 22 %
Lymphs Abs: 1.3 10*3/uL (ref 0.7–4.0)
MCH: 25.5 pg — ABNORMAL LOW (ref 26.0–34.0)
MCHC: 31.6 g/dL (ref 30.0–36.0)
MCV: 80.7 fL (ref 78.0–100.0)
MONO ABS: 0.5 10*3/uL (ref 0.1–1.0)
Monocytes Relative: 8 %
Neutro Abs: 4.1 10*3/uL (ref 1.7–7.7)
Neutrophils Relative %: 67 %
Platelets: 235 10*3/uL (ref 150–400)
RBC: 4.86 MIL/uL (ref 3.87–5.11)
RDW: 17.1 % — ABNORMAL HIGH (ref 11.5–15.5)
WBC: 6.2 10*3/uL (ref 4.0–10.5)

## 2016-11-29 LAB — IRON AND TIBC
IRON: 48 ug/dL (ref 28–170)
Saturation Ratios: 9 % — ABNORMAL LOW (ref 10.4–31.8)
TIBC: 511 ug/dL — ABNORMAL HIGH (ref 250–450)
UIBC: 463 ug/dL

## 2016-12-01 DIAGNOSIS — H43813 Vitreous degeneration, bilateral: Secondary | ICD-10-CM | POA: Diagnosis not present

## 2016-12-01 DIAGNOSIS — Z961 Presence of intraocular lens: Secondary | ICD-10-CM | POA: Diagnosis not present

## 2016-12-01 DIAGNOSIS — H04123 Dry eye syndrome of bilateral lacrimal glands: Secondary | ICD-10-CM | POA: Diagnosis not present

## 2016-12-01 DIAGNOSIS — D3131 Benign neoplasm of right choroid: Secondary | ICD-10-CM | POA: Diagnosis not present

## 2016-12-01 DIAGNOSIS — H401132 Primary open-angle glaucoma, bilateral, moderate stage: Secondary | ICD-10-CM | POA: Diagnosis not present

## 2016-12-04 ENCOUNTER — Other Ambulatory Visit (HOSPITAL_COMMUNITY): Payer: Medicare Other

## 2016-12-06 ENCOUNTER — Encounter (HOSPITAL_BASED_OUTPATIENT_CLINIC_OR_DEPARTMENT_OTHER): Payer: Medicare Other | Admitting: Oncology

## 2016-12-06 ENCOUNTER — Encounter (HOSPITAL_COMMUNITY): Payer: Self-pay | Admitting: Oncology

## 2016-12-06 ENCOUNTER — Encounter (HOSPITAL_BASED_OUTPATIENT_CLINIC_OR_DEPARTMENT_OTHER): Payer: Medicare Other

## 2016-12-06 VITALS — BP 115/63 | HR 75 | Temp 98.2°F | Resp 18 | Wt 209.2 lb

## 2016-12-06 VITALS — BP 141/61 | HR 55 | Temp 97.9°F | Resp 20

## 2016-12-06 DIAGNOSIS — K922 Gastrointestinal hemorrhage, unspecified: Secondary | ICD-10-CM | POA: Diagnosis not present

## 2016-12-06 DIAGNOSIS — R5383 Other fatigue: Secondary | ICD-10-CM | POA: Diagnosis not present

## 2016-12-06 DIAGNOSIS — D508 Other iron deficiency anemias: Secondary | ICD-10-CM

## 2016-12-06 DIAGNOSIS — D500 Iron deficiency anemia secondary to blood loss (chronic): Secondary | ICD-10-CM | POA: Diagnosis not present

## 2016-12-06 DIAGNOSIS — K9089 Other intestinal malabsorption: Secondary | ICD-10-CM

## 2016-12-06 DIAGNOSIS — K625 Hemorrhage of anus and rectum: Secondary | ICD-10-CM

## 2016-12-06 MED ORDER — SODIUM CHLORIDE 0.9% FLUSH: 10.0000 mL | INTRAVENOUS | Status: DC | PRN

## 2016-12-06 MED ORDER — SODIUM CHLORIDE 0.9 % IV SOLN
Freq: Once | INTRAVENOUS | Status: AC
  Administered 2016-12-06: 13:00:00 via INTRAVENOUS

## 2016-12-06 MED ORDER — SODIUM CHLORIDE 0.9 % IV SOLN
510.0000 mg | Freq: Once | INTRAVENOUS | Status: AC
  Administered 2016-12-06: 510 mg via INTRAVENOUS
  Filled 2016-12-06: qty 17

## 2016-12-06 NOTE — Progress Notes (Signed)
[place] PROGRESS NOTE  Patient Care Team: Asencion Noble, MD as PCP - General (Internal Medicine) Gala Romney Cristopher Estimable, MD as Consulting Physician (Gastroenterology)  CHIEF COMPLAINTS/PURPOSE OF CONSULTATION:  Iron deficiency anemia Last Colonoscopy 11/01/2012 with diverticulosis CBC on 04/28/2013 shows a hemoglobin of 11.3, hematocrit of 35.9, and MCV of 75.7. RDW is elevated at 19.3 CBC dated 03/19/2013 shows a hemoglobin of 9.7, hematocrit of 30.2, MCV of 73.3. Ferritin level of 13 ng/ml on 04/2014 EGD 10/26/2014 with erosive esophagitis  HISTORY OF PRESENTING ILLNESS:  Debra Harris 66 y.o. female returns for follow up of recurrent iron deficiency.   Patient presents today for continued follow-up of her anemia. She had missed her second dose to planned Feraheme because she was out of town in July. Otherwise she states that her profuse rectal bleeding has improved, however she is still having daily rectal bleeding. Most recent bloodwork from 11/29/2016 and demonstrated hemoglobin 12.4 g/dL, hematocrit 39.2%, MCV 80.7, ferritin 13. Overall she states she feels well. She gets occasional leg swelling she staining on it for too long. She has occasional shortness breath with exertion. She recently developed a mild rash on her right arm after she took some poison oak in her backyard. She also has chronic pain in her right knee which is 1 out of 10 in severity.  MEDICAL HISTORY:  Past Medical History:  Diagnosis Date  . Adenomatous colon polyp   . Allergic rhinitis   . Arthritis   . Diabetes mellitus   . Diverticulosis   . External hemorrhoids   . Fibromyalgia   . GERD (gastroesophageal reflux disease)   . Glaucoma   . Hemorrhoids    grade 3   . IDA (iron deficiency anemia)   . Iron deficiency anemia 06/05/2014  . Low tension glaucoma   . Neuropathy, leg   . Ovarian cyst 11/20/00  . Sleep apnea    CPAP  . Sliding hiatal hernia     SURGICAL HISTORY: Past Surgical  History:  Procedure Laterality Date  . CARPAL TUNNEL RELEASE Bilateral   . CATARACT EXTRACTION     bilateral  . CHOLECYSTECTOMY    . COLONOSCOPY  2005   Small external hemorrhoids, single sigmoid colon diverticulum, small polyp at the hepatic flexure focally adenomatous.  . COLONOSCOPY  2010   Dr. Gala Romney, Versed 4/Demerol 75. Pancolonic diverticula, external hemorrhoids. Colonoscopy every 5 years for history of adenomatous polyps  . COLONOSCOPY  2014   Dr. Olevia Perches: Propofol. mild diverticulosis in the sigmoid colon, random colon biopsies benign.  . COLONOSCOPY WITH PROPOFOL N/A 09/13/2015   Procedure: COLONOSCOPY WITH PROPOFOL;  Surgeon: Daneil Dolin, MD;  [facility]: [facility];  Service: Endoscopy;  Laterality: N/A;  1030  . ESOPHAGEAL DILATION N/A 10/26/2014   Procedure: ESOPHAGEAL DILATION [place];  Surgeon: Daneil Dolin, MD;  Location: [facility];  Service: Endoscopy;  Laterality: N/A;  . ESOPHAGOGASTRODUODENOSCOPY  2010   Dr. Gala Romney, distal esophageal erosions, Venia Minks dilator passed. Comment small hiatal hernia, patulous EG junction.  . ESOPHAGOGASTRODUODENOSCOPY (EGD) WITH PROPOFOL N/A 10/26/2014   Dr.Rourk- distal esophageal erosions c/w mild erosive reflux esophagitis. hiatal hernia, o/w normal EGD  . ESOPHAGOGASTRODUODENOSCOPY (EGD) WITH PROPOFOL N/A 09/13/2015   Procedure: ESOPHAGOGASTRODUODENOSCOPY (EGD) WITH PROPOFOL;  Surgeon: Daneil Dolin, MD;  [facility];  Service: Endoscopy;  Laterality: N/A;  . GIVENS CAPSULE STUDY N/A 10/26/2014   couple of mucosal breaks/erosions but essentially unremarkable small bowel study  . HEMORRHOID BANDING    .  KNEE SURGERY     right-arthroscopy  . OOPHORECTOMY  age 58s  . PARTIAL HYSTERECTOMY  age 74s  . RECTAL EXAM UNDER ANESTHESIA N/A 10/26/2014   circumferential grade 3 hemoorrhoids easily reducible  . thumb surger Bilateral    trigger thumb    SOCIAL HISTORY: Social History   Social History  . Marital status: Married      Spouse name: J. Orland Dec  . Number of children: Y  . Years of education: N/A   Occupational History  . AUDIOLOGIST    Social History Main Topics  . Smoking status: Never Smoker  . Smokeless tobacco: Never Used     Comment: Pt reports tried once in 7th grade//ms  . Alcohol use 0.6 oz/week    1 Glasses of wine per week     Comment: rare  . Drug use: No  . Sexual activity: No   Other Topics Concern  . Not on file   Social History Narrative   3 children & married to orthopedist. 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      3 children and one grandchild. She has been married for over 40 years. She is a never smoker. She does not drink alcohol. She still is very active and works outside the home.  FAMILY HISTORY: Family History  Problem Relation Age of Onset  . Ulcers Mother   . Glaucoma Mother   . Heart disease Mother   . Glaucoma Maternal Grandmother   . Heart disease Maternal Grandmother   . Heart disease Maternal Grandfather   . Colon cancer Neg Hx    indicated that the status of her mother is unknown. She indicated that the status of her maternal grandmother is unknown. She indicated that the status of her maternal grandfather is unknown. She indicated that the status of her neg hx is unknown.   Father died when the patient was 33 from suicide. Her mother died at 17 from an MI. She had glaucoma that caused blindness. She has no siblings.   ALLERGIES:  is allergic to injectafer [ferric carboxymaltose].  MEDICATIONS:  Current Outpatient Prescriptions  Medication Sig Dispense Refill  . cetirizine (ZYRTEC) 10 MG tablet Take 10 mg by mouth daily.     Marland Kitchen HYDROcodone-acetaminophen (NORCO) 5-325 MG tablet Take 1 tablet by mouth every 6 (six) hours as needed for moderate pain. 60 tablet 0  . metFORMIN (GLUCOPHAGE) 500 MG tablet Take 1,000 mg by mouth daily with breakfast.     . TURMERIC PO Take 1 tablet by mouth 2 (two) times daily.    . Wheat Dextrin (BENEFIBER  PO) Take by mouth.     No current facility-administered medications for this visit.    Review of Systems  Constitutional: Negative for malaise/fatigue.  HENT: Negative.   Eyes: Negative.   Respiratory: Negative for shortness of breath.   Cardiovascular: Negative for chest pain and leg swelling.  Gastrointestinal: Positive for blood in stool. Negative for abdominal pain and constipation.  Genitourinary: Negative.   Musculoskeletal: Negative for joint pain.  Skin: Positive for rash.  Neurological: Negative for tingling.  Endo/Heme/Allergies: Negative.   Psychiatric/Behavioral: Negative.   All other systems reviewed and are negative. 14 point review of systems was performed and is negative except as detailed under history of present illness and above  PHYSICAL EXAMINATION: ECOG PERFORMANCE STATUS: 0 - Asymptomatic Vitals reviewed.   Physical Exam  Constitutional: She is oriented to person, place, and time and well-developed, well-nourished, and in no  distress.  HENT:  Head: Normocephalic and atraumatic.  Eyes: Pupils are equal, round, and reactive to light. EOM are normal.  Neck: Normal range of motion. Neck supple.  Cardiovascular: Normal rate, regular rhythm and normal heart sounds.   Pulmonary/Chest: Effort normal and breath sounds normal.  Abdominal: Soft. Bowel sounds are normal.  Musculoskeletal: Normal range of motion.  Neurological: She is alert and oriented to person, place, and time. Gait normal.  Skin: Skin is warm and dry.  Nursing note and vitals reviewed.  LABORATORY DATA:  I have reviewed the data as listed Lab Results  Component Value Date   WBC 6.2 11/29/2016   HGB 12.4 11/29/2016   HCT 39.2 11/29/2016   MCV 80.7 11/29/2016   PLT 235 11/29/2016   CMP Latest Ref Rng & Units 11/29/2016 09/04/2016 07/27/2016  Glucose 65 - 99 mg/dL 98 115(H) 136(H)  BUN 6 - 20 mg/dL 11 11 10   Creatinine 0.44 - 1.00 mg/dL 0.60 0.58 0.63  Sodium 135 - 145 mmol/L 137 139 138    Potassium 3.5 - 5.1 mmol/L 3.9 4.0 3.9  Chloride 101 - 111 mmol/L 103 105 102  CO2 22 - 32 mmol/L 26 27 28   Calcium 8.9 - 10.3 mg/dL 9.3 9.2 9.4  Total Protein 6.5 - 8.1 g/dL 7.9 7.4 7.5  Total Bilirubin 0.3 - 1.2 mg/dL 0.6 0.9 0.9  Alkaline Phos 38 - 126 U/L 70 68 68  AST 15 - 41 U/L 25 27 33  ALT 14 - 54 U/L 28 22 31      Chemistry      Component Value Date/Time   NA 137 11/29/2016 1142   K 3.9 11/29/2016 1142   CL 103 11/29/2016 1142   CO2 26 11/29/2016 1142   BUN 11 11/29/2016 1142   CREATININE 0.60 11/29/2016 1142      Component Value Date/Time   CALCIUM 9.3 11/29/2016 1142   ALKPHOS 70 11/29/2016 1142   AST 25 11/29/2016 1142   ALT 28 11/29/2016 1142   BILITOT 0.6 11/29/2016 1142     ASSESSMENT & PLAN:  Iron deficiency anemia Iron malabsorption Chronic GI related blood loss questionable secondary to internal hemorrhoids Fatigue secondary to iron deficiency  Reviewed patient's labs with her in detail today. Her anemia has improved, latest hemoglobin is 12.4 g/dL however she still demonstrates an iron deficiency with a ferritin of 13.  She did miss her second dose of planned Feraheme back in July 2018. Therefore we'll set her up for another 2 doses of IV Feraheme, she'll get her first dose today.  Return to clinic in 3 months for follow up with labs as stated below.  Orders Placed This Encounter  Procedures  . CBC with Differential    Standing Status:   Future    Standing Expiration Date:   12/06/2017  . Comprehensive metabolic panel    Standing Status:   Future    Standing Expiration Date:   12/06/2017  . Iron and TIBC    Standing Status:   Future    Standing Expiration Date:   12/06/2017  . Ferritin    Standing Status:   Future    Standing Expiration Date:   12/06/2017     All questions were answered. The patient knows to call the clinic with any problems, questions or concerns.      This note was electronically signed. Twana First,  M.D.

## 2016-12-06 NOTE — Progress Notes (Signed)
Patient tolerated Feraheme without any problems. Discharged ambulatory to self and in stable condition.

## 2016-12-06 NOTE — Patient Instructions (Signed)
[place] at [facility] Discharge Instructions  RECOMMENDATIONS MADE BY THE CONSULTANT AND ANY TEST RESULTS WILL BE SENT TO YOUR REFERRING PHYSICIAN.  You were seen today by Dr. Twana First We will get you set up for 2 doses of Feraheme Follow up in 3 months with lab work   Thank you for choosing [place] at [facility] to provide your oncology and hematology care.  To afford each patient quality time with our provider, please arrive at least 15 minutes before your scheduled appointment time.    If you have a lab appointment with the [facility] please come in thru the  Main Entrance and check in at the main information desk  You need to re-schedule your appointment should you arrive 10 or more minutes late.  We strive to give you quality time with our providers, and arriving late affects you and other patients whose appointments are after yours.  Also, if you no show three or more times for appointments you may be dismissed from the clinic at the providers discretion.     Again, thank you for choosing [facility].  Our hope is that these requests will decrease the amount of time that you wait before being seen by our physicians.       _____________________________________________________________  Should you have questions after your visit to [facility], please contact our office at [phone] between the hours of 8:30 a.m. and 4:30 p.m.  Voicemails left after 4:30 p.m. will not be returned until the following business day.  For prescription refill requests, have your pharmacy contact our office.       Resources For Cancer Patients and their Caregivers ? American Cancer Society: Can assist with transportation, wigs, general needs, runs Look Good Feel Better.        430-705-2923 ? Cancer Care: Provides financial assistance, online support groups, medication/co-pay assistance.  1-800-813-HOPE  (438) 100-5337) ? [facility] Assists [place] cancer patients and their families through emotional , educational and financial support.  551-611-3409 ? [place] DSS Where to apply for food stamps, Medicaid and utility assistance. 424-371-1884 ? RCATS: Transportation to medical appointments. 561-426-5402 ? Social Security Administration: May apply for disability if have a Stage IV cancer. [phone] ? [employer], Disability and Transit Services: Assists with nutrition, care and transit needs. [place] Support Programs: @10RELATIVEDAYS @ > Cancer Support Group  2nd Tuesday of the month 1pm-2pm, Journey Room  > Creative Journey  3rd Tuesday of the month 1130am-1pm, Journey Room  > Look Good Feel Better  1st Wednesday of the month 10am-12 noon, Journey Room (Call [place] to register [phone])

## 2016-12-12 DIAGNOSIS — M9903 Segmental and somatic dysfunction of lumbar region: Secondary | ICD-10-CM | POA: Diagnosis not present

## 2016-12-12 DIAGNOSIS — M545 Low back pain: Secondary | ICD-10-CM | POA: Diagnosis not present

## 2016-12-13 ENCOUNTER — Encounter (HOSPITAL_BASED_OUTPATIENT_CLINIC_OR_DEPARTMENT_OTHER): Payer: Medicare Other

## 2016-12-13 ENCOUNTER — Encounter (HOSPITAL_COMMUNITY): Payer: Self-pay

## 2016-12-13 VITALS — BP 136/55 | HR 66 | Temp 98.2°F | Resp 18 | Wt 211.4 lb

## 2016-12-13 DIAGNOSIS — K922 Gastrointestinal hemorrhage, unspecified: Secondary | ICD-10-CM | POA: Diagnosis not present

## 2016-12-13 DIAGNOSIS — D500 Iron deficiency anemia secondary to blood loss (chronic): Secondary | ICD-10-CM | POA: Diagnosis not present

## 2016-12-13 DIAGNOSIS — K625 Hemorrhage of anus and rectum: Secondary | ICD-10-CM

## 2016-12-13 MED ORDER — SODIUM CHLORIDE 0.9 % IV SOLN
510.0000 mg | Freq: Once | INTRAVENOUS | Status: AC
  Administered 2016-12-13: 510 mg via INTRAVENOUS
  Filled 2016-12-13: qty 17

## 2016-12-13 MED ORDER — SODIUM CHLORIDE 0.9% FLUSH
10.0000 mL | INTRAVENOUS | Status: DC | PRN
  Administered 2016-12-13: 10 mL
  Filled 2016-12-13: qty 10

## 2016-12-13 MED ORDER — SODIUM CHLORIDE 0.9 % IV SOLN
Freq: Once | INTRAVENOUS | Status: AC
  Administered 2016-12-13: 14:00:00 via INTRAVENOUS

## 2016-12-13 NOTE — Patient Instructions (Signed)
[place] at [facility]  Discharge Instructions:  You received feraheme today.  Call for any questions or concerns.   _______________________________________________________________  Thank you for choosing [place] at [facility] to provide your oncology and hematology care.  To afford each patient quality time with our providers, please arrive at least 15 minutes before your scheduled appointment.  You need to re-schedule your appointment if you arrive 10 or more minutes late.  We strive to give you quality time with our providers, and arriving late affects you and other patients whose appointments are after yours.  Also, if you no show three or more times for appointments you may be dismissed from the clinic.  Again, thank you for choosing [place] at [place] hope is that these requests will allow you access to exceptional care and in a timely manner. _______________________________________________________________  If you have questions after your visit, please contact our office at [phone] between the hours of 8:30 a.m. and 5:00 p.m. Voicemails left after 4:30 p.m. will not be returned until the following business day. _______________________________________________________________  For prescription refill requests, have your pharmacy contact our office. _______________________________________________________________  Recommendations made by the consultant and any test results will be sent to your referring physician. _______________________________________________________________

## 2016-12-13 NOTE — Progress Notes (Signed)
Patient tolerated iron infusion well but after discontinuation of peripheral IV the patient stated she felt "some ringing in my ears and heart pounding in my chest".  Patient denied rash, itching, SOB, or pain.  No s/s of distress noted.  Patient warm and dry.  VSS and documented.  Dr. Talbert Cage notified and came to see the patient.  Patient informed the oncologist she felt better and was ready to leave.  The patient was instructed to call for any problems or concerns with understanding verbalized.  Peripheral IV site clean and dry with no bruising or swelling noted at site.  Band aid applied.  Good blood return noted before and after iron infusion.  Patient left ambulatory with no s/s of distress noted.  [place] for discharge per Dr. Talbert Cage.

## 2016-12-14 DIAGNOSIS — M9903 Segmental and somatic dysfunction of lumbar region: Secondary | ICD-10-CM | POA: Diagnosis not present

## 2016-12-14 DIAGNOSIS — M545 Low back pain: Secondary | ICD-10-CM | POA: Diagnosis not present

## 2017-01-12 DIAGNOSIS — H04123 Dry eye syndrome of bilateral lacrimal glands: Secondary | ICD-10-CM | POA: Diagnosis not present

## 2017-01-12 DIAGNOSIS — Z961 Presence of intraocular lens: Secondary | ICD-10-CM | POA: Diagnosis not present

## 2017-01-12 DIAGNOSIS — D3131 Benign neoplasm of right choroid: Secondary | ICD-10-CM | POA: Diagnosis not present

## 2017-01-12 DIAGNOSIS — H43813 Vitreous degeneration, bilateral: Secondary | ICD-10-CM | POA: Diagnosis not present

## 2017-02-02 ENCOUNTER — Other Ambulatory Visit: Payer: Self-pay | Admitting: Orthopedic Surgery

## 2017-02-02 MED ORDER — HYDROCODONE-ACETAMINOPHEN 5-325 MG PO TABS: 1.0000 | ORAL_TABLET | Freq: Four times a day (QID) | ORAL | 0 refills | Status: DC | PRN

## 2017-02-28 ENCOUNTER — Other Ambulatory Visit (HOSPITAL_COMMUNITY): Payer: Self-pay | Admitting: *Deleted

## 2017-02-28 DIAGNOSIS — D508 Other iron deficiency anemias: Secondary | ICD-10-CM

## 2017-03-01 ENCOUNTER — Other Ambulatory Visit (HOSPITAL_COMMUNITY): Payer: Self-pay | Admitting: Oncology

## 2017-03-01 ENCOUNTER — Inpatient Hospital Stay (HOSPITAL_COMMUNITY): Payer: Medicare Other | Attending: Oncology

## 2017-03-01 DIAGNOSIS — D508 Other iron deficiency anemias: Secondary | ICD-10-CM

## 2017-03-01 DIAGNOSIS — K909 Intestinal malabsorption, unspecified: Secondary | ICD-10-CM | POA: Diagnosis not present

## 2017-03-01 DIAGNOSIS — D509 Iron deficiency anemia, unspecified: Secondary | ICD-10-CM | POA: Diagnosis not present

## 2017-03-01 LAB — CBC WITH DIFFERENTIAL/PLATELET
BASOS ABS: 0 10*3/uL (ref 0.0–0.1)
Basophils Relative: 1 %
EOS ABS: 0.1 10*3/uL (ref 0.0–0.7)
EOS PCT: 2 %
HCT: 39.3 % (ref 36.0–46.0)
HEMOGLOBIN: 12.5 g/dL (ref 12.0–15.0)
LYMPHS ABS: 1.5 10*3/uL (ref 0.7–4.0)
LYMPHS PCT: 25 %
MCH: 28 pg (ref 26.0–34.0)
MCHC: 31.8 g/dL (ref 30.0–36.0)
MCV: 87.9 fL (ref 78.0–100.0)
Monocytes Absolute: 0.5 10*3/uL (ref 0.1–1.0)
Monocytes Relative: 8 %
NEUTROS PCT: 64 %
Neutro Abs: 3.7 10*3/uL (ref 1.7–7.7)
PLATELETS: 207 10*3/uL (ref 150–400)
RBC: 4.47 MIL/uL (ref 3.87–5.11)
RDW: 14.6 % (ref 11.5–15.5)
WBC: 5.8 10*3/uL (ref 4.0–10.5)

## 2017-03-01 LAB — COMPREHENSIVE METABOLIC PANEL
ALK PHOS: 61 U/L (ref 38–126)
ALT: 26 U/L (ref 14–54)
AST: 25 U/L (ref 15–41)
Albumin: 3.9 g/dL (ref 3.5–5.0)
Anion gap: 11 (ref 5–15)
BUN: 16 mg/dL (ref 6–20)
CALCIUM: 9.3 mg/dL (ref 8.9–10.3)
CHLORIDE: 104 mmol/L (ref 101–111)
CO2: 22 mmol/L (ref 22–32)
CREATININE: 0.65 mg/dL (ref 0.44–1.00)
GFR calc Af Amer: >60 mL/min (ref >60)
GFR calc non Af Amer: >60 mL/min (ref >60)
Glucose, Bld: 142 mg/dL — ABNORMAL HIGH (ref 65–99)
Potassium: 3.8 mmol/L (ref 3.5–5.1)
SODIUM: 137 mmol/L (ref 135–145)
Total Bilirubin: 0.5 mg/dL (ref 0.3–1.2)
Total Protein: 7.3 g/dL (ref 6.5–8.1)

## 2017-03-01 LAB — FERRITIN: FERRITIN: 36 ng/mL (ref 11–307)

## 2017-03-01 LAB — IRON AND TIBC
Iron: 64 ug/dL (ref 28–170)
Saturation Ratios: 15 % (ref 10.4–31.8)
TIBC: 416 ug/dL (ref 250–450)
UIBC: 352 ug/dL

## 2017-03-07 ENCOUNTER — Other Ambulatory Visit: Payer: Self-pay

## 2017-03-07 ENCOUNTER — Inpatient Hospital Stay (HOSPITAL_COMMUNITY): Payer: Medicare Other

## 2017-03-07 ENCOUNTER — Encounter (HOSPITAL_COMMUNITY): Payer: Self-pay | Admitting: Oncology

## 2017-03-07 ENCOUNTER — Inpatient Hospital Stay (HOSPITAL_BASED_OUTPATIENT_CLINIC_OR_DEPARTMENT_OTHER): Payer: Medicare Other | Admitting: Oncology

## 2017-03-07 VITALS — BP 130/58 | HR 69 | Temp 97.5°F | Resp 18 | Wt 216.8 lb

## 2017-03-07 DIAGNOSIS — D509 Iron deficiency anemia, unspecified: Secondary | ICD-10-CM | POA: Diagnosis not present

## 2017-03-07 DIAGNOSIS — K909 Intestinal malabsorption, unspecified: Secondary | ICD-10-CM | POA: Diagnosis not present

## 2017-03-07 DIAGNOSIS — D500 Iron deficiency anemia secondary to blood loss (chronic): Secondary | ICD-10-CM

## 2017-03-07 DIAGNOSIS — D508 Other iron deficiency anemias: Secondary | ICD-10-CM

## 2017-03-07 DIAGNOSIS — K625 Hemorrhage of anus and rectum: Secondary | ICD-10-CM

## 2017-03-07 MED ORDER — SODIUM CHLORIDE 0.9 % IV SOLN
Freq: Once | INTRAVENOUS | Status: AC
  Administered 2017-03-07: 14:00:00 via INTRAVENOUS

## 2017-03-07 MED ORDER — SODIUM CHLORIDE 0.9 % IV SOLN
510.0000 mg | Freq: Once | INTRAVENOUS | Status: AC
  Administered 2017-03-07: 510 mg via INTRAVENOUS
  Filled 2017-03-07: qty 17

## 2017-03-07 NOTE — Patient Instructions (Signed)
[facility] at [facility] Discharge Instructions  RECOMMENDATIONS MADE BY THE CONSULTANT AND ANY TEST RESULTS WILL BE SENT TO YOUR REFERRING PHYSICIAN.  Received Feraheme infusion today.Follow-up as scheduled. Call clinic for any questions or concerns  Thank you for choosing [facility] at [facility] to provide your oncology and hematology care.  To afford each patient quality time with our provider, please arrive at least 15 minutes before your scheduled appointment time.    If you have a lab appointment with the Cancer Center please come in thru the  Main Entrance and check in at the main information desk  You need to re-schedule your appointment should you arrive 10 or more minutes late.  We strive to give you quality time with our providers, and arriving late affects you and other patients whose appointments are after yours.  Also, if you no show three or more times for appointments you may be dismissed from the clinic at the providers discretion.     Again, thank you for choosing  Cancer Center.  Our hope is that these requests will decrease the amount of time that you wait before being seen by our physicians.       _____________________________________________________________  Should you have questions after your visit to  Cancer Center, please contact our office at [phone] between the hours of 8:30 a.m. and 4:30 p.m.  Voicemails left after 4:30 p.m. will not be returned until the following business day.  For prescription refill requests, have your pharmacy contact our office.       Resources For Cancer Patients and their Caregivers ? American Cancer Society: Can assist with transportation, wigs, general needs, runs Look Good Feel Better.        1-888-227-6333 ? Cancer Care: Provides financial assistance, online support groups, medication/co-pay assistance.  1-800-813-HOPE (4673) ? [facility] Assists [place] cancer patients and their families through emotional , educational and financial support.  336-427-4357 ? [place] DSS Where to apply for food stamps, Medicaid and utility assistance. 336-342-1394 ? RCATS: Transportation to medical appointments. 336-347-2287 ? Social Security Administration: May apply for disability if have a Stage IV cancer. 336-342-7796 1-800-772-1213 ? [place] Aging, Disability and Transit Services: Assists with nutrition, care and transit needs. 336-349-2343  Cancer Center Support Programs: @10RELATIVEDAYS@ > Cancer Support Group  2nd Tuesday of the month 1pm-2pm, Journey Room  > Creative Journey  3rd Tuesday of the month 1130am-1pm, Journey Room  > Look Good Feel Better  1st Wednesday of the month 10am-12 noon, Journey Room (Call American Cancer Society to register 1-800-395-5775)   

## 2017-03-07 NOTE — Patient Instructions (Signed)
[facility] at [facility] Discharge Instructions  RECOMMENDATIONS MADE BY THE CONSULTANT AND ANY TEST RESULTS WILL BE SENT TO YOUR REFERRING PHYSICIAN.  You were seen today by Jenny Burns, NP  Thank you for choosing [facility] at [facility] to provide your oncology and hematology care.  To afford each patient quality time with our provider, please arrive at least 15 minutes before your scheduled appointment time.    If you have a lab appointment with the Cancer Center please come in thru the  Main Entrance and check in at the main information desk  You need to re-schedule your appointment should you arrive 10 or more minutes late.  We strive to give you quality time with our providers, and arriving late affects you and other patients whose appointments are after yours.  Also, if you no show three or more times for appointments you may be dismissed from the clinic at the providers discretion.     Again, thank you for choosing [facility].  Our hope is that these requests will decrease the amount of time that you wait before being seen by our physicians.       _____________________________________________________________  Should you have questions after your visit to [facility], please contact our office at [phone] between the hours of 8:30 a.m. and 4:30 p.m.  Voicemails left after 4:30 p.m. will not be returned until the following business day.  For prescription refill requests, have your pharmacy contact our office.       Resources For Cancer Patients and their Caregivers ? American Cancer Society: Can assist with transportation, wigs, general needs, runs Look Good Feel Better.        1-888-227-6333 ? Cancer Care: Provides financial assistance, online support groups, medication/co-pay assistance.  1-800-813-HOPE (4673) ? [facility] Assists [place] cancer patients and their  families through emotional , educational and financial support.  336-427-4357 ? [place] DSS Where to apply for food stamps, Medicaid and utility assistance. 336-342-1394 ? RCATS: Transportation to medical appointments. 336-347-2287 ? Social Security Administration: May apply for disability if have a Stage IV cancer. 336-342-7796 1-800-772-1213 ? [place] Aging, Disability and Transit Services: Assists with nutrition, care and transit needs. 336-349-2343  Cancer Center Support Programs: @10RELATIVEDAYS@ > Cancer Support Group  2nd Tuesday of the month 1pm-2pm, Journey Room  > Creative Journey  3rd Tuesday of the month 1130am-1pm, Journey Room  > Look Good Feel Better  1st Wednesday of the month 10am-12 noon, Journey Room (Call American Cancer Society to register 1-800-395-5775)    

## 2017-03-07 NOTE — Progress Notes (Signed)
Debra Harris tolerated Feraheme infusion well without complaints or incident. VSS upon discharge.Pt discharged self ambulatory in satisfactory condition

## 2017-03-07 NOTE — Progress Notes (Signed)
[place] PROGRESS NOTE  Patient Care Team: Asencion Noble, MD as PCP - General (Internal Medicine) Gala Romney Cristopher Estimable, MD as Consulting Physician (Gastroenterology)  CHIEF COMPLAINTS/PURPOSE OF CONSULTATION:  Iron deficiency anemia Last Colonoscopy 11/01/2012 with diverticulosis CBC on 04/28/2013 shows a hemoglobin of 11.3, hematocrit of 35.9, and MCV of 75.7. RDW is elevated at 19.3 CBC dated 03/19/2013 shows a hemoglobin of 9.7, hematocrit of 30.2, MCV of 73.3. Ferritin level of 13 ng/ml on 04/2014 EGD 10/26/2014 with erosive esophagitis  HISTORY OF PRESENTING ILLNESS:  Debra Harris 67 y.o. female returns for follow up of recurrent iron deficiency.    Patient presents today for continued follow-up of her anemia.  Appetite is 100% energy level 75% complains of significant fatigue.  She denies any rectal bleeding.  She has occasional constipation relieved with OTC stool softeners.  She is all over pain rated 4 out of 10 that is relieved with OTC analgesia.  States she has occasional bilateral lower extremity swelling relieved with elevation and occasional shortness of breath with exertion.  More recent complaint is problems falling asleep at night.  She has not tried anything for this.  MEDICAL HISTORY:  Past Medical History:  Diagnosis Date  . Adenomatous colon polyp   . Allergic rhinitis   . Arthritis   . Diabetes mellitus   . Diverticulosis   . External hemorrhoids   . Fibromyalgia   . GERD (gastroesophageal reflux disease)   . Glaucoma   . Hemorrhoids    grade 3   . IDA (iron deficiency anemia)   . Iron deficiency anemia 06/05/2014  . Low tension glaucoma   . Neuropathy, leg   . Ovarian cyst 11/20/00  . Sleep apnea    CPAP  . Sliding hiatal hernia     SURGICAL HISTORY: Past Surgical History:  Procedure Laterality Date  . CARPAL TUNNEL RELEASE Bilateral   . CATARACT EXTRACTION     bilateral  . CHOLECYSTECTOMY    . COLONOSCOPY  2005   Small external  hemorrhoids, single sigmoid colon diverticulum, small polyp at the hepatic flexure focally adenomatous.  . COLONOSCOPY  2010   Dr. Gala Romney, Versed 4/Demerol 75. Pancolonic diverticula, external hemorrhoids. Colonoscopy every 5 years for history of adenomatous polyps  . COLONOSCOPY  2014   Dr. Olevia Perches: Propofol. mild diverticulosis in the sigmoid colon, random colon biopsies benign.  . COLONOSCOPY WITH PROPOFOL N/A 09/13/2015   Procedure: COLONOSCOPY WITH PROPOFOL;  Surgeon: Daneil Dolin, MD;  [facility]: [facility];  Service: Endoscopy;  Laterality: N/A;  1030  . ESOPHAGEAL DILATION N/A 10/26/2014   Procedure: ESOPHAGEAL DILATION [place];  Surgeon: Daneil Dolin, MD;  Location: [facility] ORS;  Service: Endoscopy;  Laterality: N/A;  . ESOPHAGOGASTRODUODENOSCOPY  2010   Dr. Gala Romney, distal esophageal erosions, Venia Minks dilator passed. Comment small hiatal hernia, patulous EG junction.  . ESOPHAGOGASTRODUODENOSCOPY (EGD) WITH PROPOFOL N/A 10/26/2014   Dr.Rourk- distal esophageal erosions c/w mild erosive reflux esophagitis. hiatal hernia, o/w normal EGD  . ESOPHAGOGASTRODUODENOSCOPY (EGD) WITH PROPOFOL N/A 09/13/2015   Procedure: ESOPHAGOGASTRODUODENOSCOPY (EGD) WITH PROPOFOL;  Surgeon: Daneil Dolin, MD;  Location: [facility];  Service: Endoscopy;  Laterality: N/A;  . GIVENS CAPSULE STUDY N/A 10/26/2014   couple of mucosal breaks/erosions but essentially unremarkable small bowel study  . HEMORRHOID BANDING    . KNEE SURGERY     right-arthroscopy  . OOPHORECTOMY  age 78s  . PARTIAL HYSTERECTOMY  age 53s  . RECTAL EXAM UNDER ANESTHESIA  N/A 10/26/2014   circumferential grade 3 hemoorrhoids easily reducible  . thumb surger Bilateral    trigger thumb    SOCIAL HISTORY: Social History   Socioeconomic History  . Marital status: Married    Spouse name: J. Orland Dec  . Number of children: Y  . Years of education: Not on file  . Highest education level: Not on file  Social Needs  . Financial  resource strain: Not on file  . Food insecurity - worry: Not on file  . Food insecurity - inability: Not on file  . Transportation needs - medical: Not on file  . Transportation needs - non-medical: Not on file  Occupational History  . Occupation: AUDIOLOGIST  Tobacco Use  . Smoking status: Never Smoker  . Smokeless tobacco: Never Used  . Tobacco comment: Pt reports tried once in 7th grade//ms  Substance and Sexual Activity  . Alcohol use: Yes    Alcohol/week: 0.6 oz    Types: 1 Glasses of wine per week    Comment: rare  . Drug use: No  . Sexual activity: No    Birth control/protection: None  Other Topics Concern  . Not on file  Social History Narrative   3 children & married to orthopedist. 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   3 children and one grandchild. She has been married for over 40 years. She is a never smoker. She does not drink alcohol. She still is very active and works outside the home.  FAMILY HISTORY: Family History  Problem Relation Age of Onset  . Ulcers Mother   . Glaucoma Mother   . Heart disease Mother   . Glaucoma Maternal Grandmother   . Heart disease Maternal Grandmother   . Heart disease Maternal Grandfather   . Colon cancer Neg Hx    indicated that the status of her mother is unknown. She indicated that the status of her maternal grandmother is unknown. She indicated that the status of her maternal grandfather is unknown. She indicated that the status of her neg hx is unknown.  Father died when the patient was 34 from suicide. Her mother died at 16 from an MI. She had glaucoma that caused blindness. She has no siblings.   ALLERGIES:  is allergic to injectafer [ferric carboxymaltose].  MEDICATIONS:  Current Outpatient Medications  Medication Sig Dispense Refill  . cetirizine (ZYRTEC) 10 MG tablet Take 10 mg by mouth daily.     Marland Kitchen HYDROcodone-acetaminophen (NORCO) 5-325 MG tablet Take 1 tablet by mouth every 6 (six) hours as needed for  moderate pain. 60 tablet 0  . metFORMIN (GLUCOPHAGE) 500 MG tablet Take 1,000 mg by mouth daily with breakfast.     . naproxen sodium (ALEVE) 220 MG tablet Take 220-440 mg by mouth daily as needed.    . TURMERIC PO Take 1 tablet by mouth 2 (two) times daily.    . Wheat Dextrin (BENEFIBER PO) Take by mouth.     No current facility-administered medications for this visit.    Review of Systems  Constitutional: Positive for malaise/fatigue.  HENT: Negative.   Eyes: Negative.   Respiratory: Negative for shortness of breath.   Cardiovascular: Negative for chest pain and leg swelling.  Gastrointestinal: Positive for constipation. Negative for abdominal pain and blood in stool.  Genitourinary: Negative.   Musculoskeletal: Positive for myalgias. Negative for joint pain.  Skin: Negative for rash.  Neurological: Positive for weakness. Negative for tingling.  Endo/Heme/Allergies: Negative.   Psychiatric/Behavioral: The patient  has insomnia.   All other systems reviewed and are negative. 14 point review of systems was performed and is negative except as detailed under history of present illness and above  PHYSICAL EXAMINATION: ECOG PERFORMANCE STATUS: 0 - Asymptomatic Vitals reviewed.   Physical Exam  Constitutional: She is oriented to person, place, and time and well-developed, well-nourished, and in no distress.  HENT:  Head: Normocephalic and atraumatic.  Eyes: EOM are normal. Pupils are equal, round, and reactive to light.  Neck: Normal range of motion. Neck supple.  Cardiovascular: Normal rate, regular rhythm and normal heart sounds.  Pulmonary/Chest: Effort normal and breath sounds normal.  Abdominal: Soft. Bowel sounds are normal.  Musculoskeletal: Normal range of motion. She exhibits edema (Nonpitting edema bilateral lower extremities).  Neurological: She is alert and oriented to person, place, and time. Gait normal.  Skin: Skin is warm and dry.  Nursing note and vitals  reviewed.  LABORATORY DATA:  I have reviewed the data as listed Lab Results  Component Value Date   WBC 5.8 03/01/2017   HGB 12.5 03/01/2017   HCT 39.3 03/01/2017   MCV 87.9 03/01/2017   PLT 207 03/01/2017   CMP Latest Ref Rng & Units 03/01/2017 11/29/2016 09/04/2016  Glucose 65 - 99 mg/dL 142(H) 98 115(H)  BUN 6 - 20 mg/dL 16 11 11   Creatinine 0.44 - 1.00 mg/dL 0.65 0.60 0.58  Sodium 135 - 145 mmol/L 137 137 139  Potassium 3.5 - 5.1 mmol/L 3.8 3.9 4.0  Chloride 101 - 111 mmol/L 104 103 105  CO2 22 - 32 mmol/L 22 26 27   Calcium 8.9 - 10.3 mg/dL 9.3 9.3 9.2  Total Protein 6.5 - 8.1 g/dL 7.3 7.9 7.4  Total Bilirubin 0.3 - 1.2 mg/dL 0.5 0.6 0.9  Alkaline Phos 38 - 126 U/L 61 70 68  AST 15 - 41 U/L 25 25 27   ALT 14 - 54 U/L 26 28 22      Chemistry      Component Value Date/Time   NA 137 03/01/2017 1029   K 3.8 03/01/2017 1029   CL 104 03/01/2017 1029   CO2 22 03/01/2017 1029   BUN 16 03/01/2017 1029   CREATININE 0.65 03/01/2017 1029      Component Value Date/Time   CALCIUM 9.3 03/01/2017 1029   ALKPHOS 61 03/01/2017 1029   AST 25 03/01/2017 1029   ALT 26 03/01/2017 1029   BILITOT 0.5 03/01/2017 1029     ASSESSMENT & PLAN:  Iron deficiency anemia Iron malabsorption Chronic GI related blood loss questionable secondary to internal hemorrhoids Fatigue secondary to iron deficiency  Reviewed patient's labs with her in detail today.  Her labs have significantly improved.  Most recent hemoglobin is 12.5 g/dL.  Her ferritin is 36 today with an iron saturation of 15%.  Based on calculations she is still iron deficient.  We will set her up for 1 dose of IV Feraheme and then return to clinic in 1 month to see if she needs  additional IV iron.  Please set patient up for 1 dose of IV Feraheme 510 mg.  Calculated iron deficit is ~ 509 mg with a target HGB of 14 g/dL.  Supportive therapy plan is built.  As far as her insomnia is concerned, suggested she start with OTC sleep aids such as  melatonin.  She was agreeable to this plan.  Return to clinic in 1 month for labs.  Orders Placed This Encounter  Procedures  . CBC with Differential/Platelet  Standing Status:   Future    Standing Expiration Date:   03/13/2018  . Iron and TIBC    Standing Status:   Future    Standing Expiration Date:   03/13/2018  . Ferritin    Standing Status:   Future    Standing Expiration Date:   09/11/2018    All questions were answered. The patient knows to call the clinic with any problems, questions or concerns.    Greater than 50% was spent in counseling and coordination of care with this patient including but not limited to discussion of the relevant topics above (See A&P) including, but not limited to diagnosis and management of acute and chronic medical conditions.   Faythe Casa, NP 03/13/2017 11:22 AM

## 2017-03-08 ENCOUNTER — Ambulatory Visit (HOSPITAL_COMMUNITY): Payer: Medicare Other

## 2017-03-14 ENCOUNTER — Other Ambulatory Visit (HOSPITAL_COMMUNITY): Payer: Medicare Other

## 2017-03-15 DIAGNOSIS — Z79899 Other long term (current) drug therapy: Secondary | ICD-10-CM | POA: Diagnosis not present

## 2017-03-15 DIAGNOSIS — E119 Type 2 diabetes mellitus without complications: Secondary | ICD-10-CM | POA: Diagnosis not present

## 2017-03-15 DIAGNOSIS — M797 Fibromyalgia: Secondary | ICD-10-CM | POA: Diagnosis not present

## 2017-03-19 DIAGNOSIS — Z6838 Body mass index (BMI) 38.0-38.9, adult: Secondary | ICD-10-CM | POA: Diagnosis not present

## 2017-03-19 DIAGNOSIS — M797 Fibromyalgia: Secondary | ICD-10-CM | POA: Diagnosis not present

## 2017-03-19 DIAGNOSIS — E119 Type 2 diabetes mellitus without complications: Secondary | ICD-10-CM | POA: Diagnosis not present

## 2017-04-03 ENCOUNTER — Telehealth (HOSPITAL_COMMUNITY): Payer: Self-pay | Admitting: Emergency Medicine

## 2017-04-03 ENCOUNTER — Other Ambulatory Visit (HOSPITAL_COMMUNITY): Payer: Self-pay | Admitting: Emergency Medicine

## 2017-04-03 NOTE — Telephone Encounter (Signed)
Pt called and wanted to add histamine lab to her lab work tomorrow.  She has had someone tell her about the research that long term use of histamines can cause iron malabsorption.  Spoke with Mike Craze NP and she said there are two types of histamines.  H2 and H1.  Zyrtec (which is what the pt has been on long term) is an H1 blocker.  H2 blocker are found in the gut and lower gastric levels and can lead to iron malabsorption if used long term.  These would include drugs such as prilosec, pepcid, or protonix.  Pt verbalized understanding.  Her last lab work showed that iron studies were ok and hemoglobin was good.  The iron was given to give her a little boost.

## 2017-04-04 ENCOUNTER — Encounter (HOSPITAL_COMMUNITY): Payer: Self-pay | Admitting: Oncology

## 2017-04-04 ENCOUNTER — Inpatient Hospital Stay (HOSPITAL_COMMUNITY): Payer: Medicare Other | Attending: Oncology

## 2017-04-04 ENCOUNTER — Telehealth (HOSPITAL_COMMUNITY): Payer: Self-pay | Admitting: Oncology

## 2017-04-04 DIAGNOSIS — K909 Intestinal malabsorption, unspecified: Secondary | ICD-10-CM | POA: Diagnosis not present

## 2017-04-04 DIAGNOSIS — D509 Iron deficiency anemia, unspecified: Secondary | ICD-10-CM | POA: Insufficient documentation

## 2017-04-04 DIAGNOSIS — D508 Other iron deficiency anemias: Secondary | ICD-10-CM

## 2017-04-04 LAB — COMPREHENSIVE METABOLIC PANEL
ALK PHOS: 63 U/L (ref 38–126)
ALT: 27 U/L (ref 14–54)
AST: 22 U/L (ref 15–41)
Albumin: 3.8 g/dL (ref 3.5–5.0)
Anion gap: 8 (ref 5–15)
BUN: 13 mg/dL (ref 6–20)
CALCIUM: 9.1 mg/dL (ref 8.9–10.3)
CO2: 27 mmol/L (ref 22–32)
CREATININE: 0.64 mg/dL (ref 0.44–1.00)
Chloride: 105 mmol/L (ref 101–111)
Glucose, Bld: 130 mg/dL — ABNORMAL HIGH (ref 65–99)
Potassium: 3.9 mmol/L (ref 3.5–5.1)
Sodium: 140 mmol/L (ref 135–145)
Total Bilirubin: 0.4 mg/dL (ref 0.3–1.2)
Total Protein: 7.2 g/dL (ref 6.5–8.1)

## 2017-04-04 LAB — IRON AND TIBC
IRON: 51 ug/dL (ref 28–170)
Saturation Ratios: 13 % (ref 10.4–31.8)
TIBC: 396 ug/dL (ref 250–450)
UIBC: 345 ug/dL

## 2017-04-04 LAB — CBC WITH DIFFERENTIAL/PLATELET
BASOS PCT: 0 %
Basophils Absolute: 0 10*3/uL (ref 0.0–0.1)
EOS ABS: 0.1 10*3/uL (ref 0.0–0.7)
EOS PCT: 1 %
HCT: 39.2 % (ref 36.0–46.0)
Hemoglobin: 12.4 g/dL (ref 12.0–15.0)
LYMPHS ABS: 2 10*3/uL (ref 0.7–4.0)
Lymphocytes Relative: 24 %
MCH: 28.5 pg (ref 26.0–34.0)
MCHC: 31.6 g/dL (ref 30.0–36.0)
MCV: 90.1 fL (ref 78.0–100.0)
MONOS PCT: 9 %
Monocytes Absolute: 0.8 10*3/uL (ref 0.1–1.0)
Neutro Abs: 5.5 10*3/uL (ref 1.7–7.7)
Neutrophils Relative %: 66 %
PLATELETS: 232 10*3/uL (ref 150–400)
RBC: 4.35 MIL/uL (ref 3.87–5.11)
RDW: 14.2 % (ref 11.5–15.5)
WBC: 8.3 10*3/uL (ref 4.0–10.5)

## 2017-04-04 LAB — FERRITIN: Ferritin: 90 ng/mL (ref 11–307)

## 2017-04-04 NOTE — Telephone Encounter (Signed)
Patient recently had IV infusion and was to be seen back within 1 month to evaluate iron panel and ferritin.  She continues to be iron deficient.  Please set her up for 1 additional dose of IV Feraheme. Ferritin is better at 90.   Please set patient up for 1 dose of IV Feraheme 510 mg.  Calculated iron deficit is ~ 325 mg with a target HGB of 14 g/dL.  Supportive therapy plan is built.  Faythe Casa, NP 04/04/2017 3:49 PM

## 2017-04-10 ENCOUNTER — Inpatient Hospital Stay (HOSPITAL_COMMUNITY): Payer: Medicare Other

## 2017-04-10 ENCOUNTER — Encounter (HOSPITAL_COMMUNITY): Payer: Self-pay

## 2017-04-10 ENCOUNTER — Other Ambulatory Visit: Payer: Self-pay

## 2017-04-10 VITALS — BP 154/62 | HR 71 | Temp 97.5°F | Resp 16

## 2017-04-10 DIAGNOSIS — K909 Intestinal malabsorption, unspecified: Secondary | ICD-10-CM | POA: Diagnosis not present

## 2017-04-10 DIAGNOSIS — K625 Hemorrhage of anus and rectum: Secondary | ICD-10-CM

## 2017-04-10 DIAGNOSIS — D509 Iron deficiency anemia, unspecified: Secondary | ICD-10-CM | POA: Diagnosis not present

## 2017-04-10 DIAGNOSIS — D500 Iron deficiency anemia secondary to blood loss (chronic): Secondary | ICD-10-CM

## 2017-04-10 MED ORDER — SODIUM CHLORIDE 0.9 % IV SOLN
INTRAVENOUS | Status: DC
  Administered 2017-04-10: 14:00:00 via INTRAVENOUS

## 2017-04-10 MED ORDER — SODIUM CHLORIDE 0.9 % IV SOLN
510.0000 mg | Freq: Once | INTRAVENOUS | Status: AC
  Administered 2017-04-10: 510 mg via INTRAVENOUS
  Filled 2017-04-10: qty 17

## 2017-04-10 NOTE — Progress Notes (Signed)
Treatment given per orders. Patient tolerated it well without problems. Vitals stable and discharged home from clinic ambulatory. Follow up as scheduled.  

## 2017-04-10 NOTE — Patient Instructions (Signed)
[facility] at [facility] Discharge Instructions  RECOMMENDATIONS MADE BY THE CONSULTANT AND ANY TEST RESULTS WILL BE SENT TO YOUR REFERRING PHYSICIAN.  Feraheme given today Follow up as scheduled.  Thank you for choosing [facility] at [facility] to provide your oncology and hematology care.  To afford each patient quality time with our provider, please arrive at least 15 minutes before your scheduled appointment time.    If you have a lab appointment with the Cancer Center please come in thru the  Main Entrance and check in at the main information desk  You need to re-schedule your appointment should you arrive 10 or more minutes late.  We strive to give you quality time with our providers, and arriving late affects you and other patients whose appointments are after yours.  Also, if you no show three or more times for appointments you may be dismissed from the clinic at the providers discretion.     Again, thank you for choosing [facility].  Our hope is that these requests will decrease the amount of time that you wait before being seen by our physicians.       _____________________________________________________________  Should you have questions after your visit to [facility], please contact our office at [phone] between the hours of 8:30 a.m. and 4:30 p.m.  Voicemails left after 4:30 p.m. will not be returned until the following business day.  For prescription refill requests, have your pharmacy contact our office.       Resources For Cancer Patients and their Caregivers ? American Cancer Society: Can assist with transportation, wigs, general needs, runs Look Good Feel Better.        1-888-227-6333 ? Cancer Care: Provides financial assistance, online support groups, medication/co-pay assistance.  1-800-813-HOPE (4673) ? [facility] Assists [place] cancer patients and  their families through emotional , educational and financial support.  336-427-4357 ? [place] DSS Where to apply for food stamps, Medicaid and utility assistance. 336-342-1394 ? [employer]: Transportation to medical appointments. 336-347-2287 ? Social Security Administration: May apply for disability if have a Stage IV cancer. 336-342-7796 1-800-772-1213 ? [place] Aging, Disability and Transit Services: Assists with nutrition, care and transit needs. 336-349-2343  Cancer Center Support Programs: [email or web address]@ > Cancer Support Group  2nd Tuesday of the month 1pm-2pm, Journey Room  > Creative Journey  3rd Tuesday of the month 1130am-1pm, Journey Room  > Look Good Feel Better  1st Wednesday of the month 10am-12 noon, Journey Room (Call American Cancer Society to register 1-800-395-5775)   

## 2017-04-16 DIAGNOSIS — Z961 Presence of intraocular lens: Secondary | ICD-10-CM | POA: Diagnosis not present

## 2017-04-16 DIAGNOSIS — D3131 Benign neoplasm of right choroid: Secondary | ICD-10-CM | POA: Diagnosis not present

## 2017-04-16 DIAGNOSIS — H401132 Primary open-angle glaucoma, bilateral, moderate stage: Secondary | ICD-10-CM | POA: Diagnosis not present

## 2017-04-16 DIAGNOSIS — H04123 Dry eye syndrome of bilateral lacrimal glands: Secondary | ICD-10-CM | POA: Diagnosis not present

## 2017-04-16 DIAGNOSIS — H43813 Vitreous degeneration, bilateral: Secondary | ICD-10-CM | POA: Diagnosis not present

## 2017-04-25 ENCOUNTER — Telehealth (HOSPITAL_COMMUNITY): Payer: Self-pay

## 2017-04-25 NOTE — Telephone Encounter (Signed)
Patient called stating when she had her blood drawn on February 3rd, the usual person did not draw it. She states the lab tech drew it from her left arm that has a "nib" in it and stops IV's from threading. She states her antecubital area is sore, bruised and painful. She is concerned she may have a clot. She states her elbow is weak. She is planning on going on a trip soon and will be flying and is worried about her arm getting worse. Reviewed with Mike Craze, NP, who recommended patient follow up with her PCP. Notified patient of NP's recommendations with understanding verbalized.

## 2017-07-05 ENCOUNTER — Other Ambulatory Visit (HOSPITAL_COMMUNITY): Payer: Medicare Other

## 2017-07-06 ENCOUNTER — Other Ambulatory Visit (HOSPITAL_COMMUNITY): Payer: Self-pay | Admitting: *Deleted

## 2017-07-06 DIAGNOSIS — D509 Iron deficiency anemia, unspecified: Secondary | ICD-10-CM

## 2017-07-09 ENCOUNTER — Inpatient Hospital Stay (HOSPITAL_COMMUNITY): Payer: Medicare Other | Attending: Hematology

## 2017-07-09 ENCOUNTER — Other Ambulatory Visit (HOSPITAL_COMMUNITY): Payer: Self-pay

## 2017-07-09 DIAGNOSIS — K922 Gastrointestinal hemorrhage, unspecified: Secondary | ICD-10-CM | POA: Diagnosis not present

## 2017-07-09 DIAGNOSIS — E119 Type 2 diabetes mellitus without complications: Secondary | ICD-10-CM

## 2017-07-09 DIAGNOSIS — D500 Iron deficiency anemia secondary to blood loss (chronic): Secondary | ICD-10-CM | POA: Diagnosis not present

## 2017-07-09 DIAGNOSIS — K909 Intestinal malabsorption, unspecified: Secondary | ICD-10-CM | POA: Insufficient documentation

## 2017-07-09 DIAGNOSIS — D509 Iron deficiency anemia, unspecified: Secondary | ICD-10-CM

## 2017-07-09 LAB — COMPREHENSIVE METABOLIC PANEL
ALBUMIN: 4.1 g/dL (ref 3.5–5.0)
ALT: 36 U/L (ref 14–54)
ANION GAP: 8 (ref 5–15)
AST: 33 U/L (ref 15–41)
Alkaline Phosphatase: 64 U/L (ref 38–126)
BILIRUBIN TOTAL: 0.8 mg/dL (ref 0.3–1.2)
BUN: 14 mg/dL (ref 6–20)
CO2: 27 mmol/L (ref 22–32)
Calcium: 9.5 mg/dL (ref 8.9–10.3)
Chloride: 105 mmol/L (ref 101–111)
Creatinine, Ser: 0.59 mg/dL (ref 0.44–1.00)
GFR calc non Af Amer: >60 mL/min (ref >60)
GLUCOSE: 117 mg/dL — AB (ref 65–99)
POTASSIUM: 3.5 mmol/L (ref 3.5–5.1)
SODIUM: 140 mmol/L (ref 135–145)
TOTAL PROTEIN: 7.4 g/dL (ref 6.5–8.1)

## 2017-07-09 LAB — CBC WITH DIFFERENTIAL/PLATELET
BASOS PCT: 1 %
Basophils Absolute: 0 10*3/uL (ref 0.0–0.1)
EOS ABS: 0.2 10*3/uL (ref 0.0–0.7)
Eosinophils Relative: 3 %
HEMATOCRIT: 35.5 % — AB (ref 36.0–46.0)
Hemoglobin: 11.1 g/dL — ABNORMAL LOW (ref 12.0–15.0)
Lymphocytes Relative: 24 %
Lymphs Abs: 1.4 10*3/uL (ref 0.7–4.0)
MCH: 27.1 pg (ref 26.0–34.0)
MCHC: 31.3 g/dL (ref 30.0–36.0)
MCV: 86.6 fL (ref 78.0–100.0)
MONO ABS: 0.5 10*3/uL (ref 0.1–1.0)
Monocytes Relative: 8 %
Neutro Abs: 3.7 10*3/uL (ref 1.7–7.7)
Neutrophils Relative %: 64 %
Platelets: 278 10*3/uL (ref 150–400)
RBC: 4.1 MIL/uL (ref 3.87–5.11)
RDW: 13.5 % (ref 11.5–15.5)
WBC: 5.7 10*3/uL (ref 4.0–10.5)

## 2017-07-09 LAB — IRON AND TIBC
IRON: 33 ug/dL (ref 28–170)
SATURATION RATIOS: 7 % — AB (ref 10.4–31.8)
TIBC: 458 ug/dL — AB (ref 250–450)
UIBC: 425 ug/dL

## 2017-07-09 LAB — HEMOGLOBIN A1C
Hgb A1c MFr Bld: 5.5 % (ref 4.8–5.6)
MEAN PLASMA GLUCOSE: 111.15 mg/dL

## 2017-07-09 LAB — FERRITIN: Ferritin: 11 ng/mL (ref 11–307)

## 2017-07-11 ENCOUNTER — Other Ambulatory Visit (HOSPITAL_COMMUNITY): Payer: Self-pay | Admitting: *Deleted

## 2017-07-11 DIAGNOSIS — D509 Iron deficiency anemia, unspecified: Secondary | ICD-10-CM

## 2017-07-12 ENCOUNTER — Ambulatory Visit (HOSPITAL_COMMUNITY): Payer: Medicare Other

## 2017-07-12 ENCOUNTER — Inpatient Hospital Stay (HOSPITAL_COMMUNITY): Payer: Medicare Other | Attending: Hematology

## 2017-07-12 ENCOUNTER — Encounter (HOSPITAL_COMMUNITY): Payer: Self-pay | Admitting: Hematology

## 2017-07-12 ENCOUNTER — Inpatient Hospital Stay (HOSPITAL_BASED_OUTPATIENT_CLINIC_OR_DEPARTMENT_OTHER): Payer: Medicare Other | Admitting: Hematology

## 2017-07-12 VITALS — BP 114/55 | HR 66 | Temp 98.6°F | Resp 18 | Wt 211.7 lb

## 2017-07-12 DIAGNOSIS — D509 Iron deficiency anemia, unspecified: Secondary | ICD-10-CM | POA: Insufficient documentation

## 2017-07-12 DIAGNOSIS — E119 Type 2 diabetes mellitus without complications: Secondary | ICD-10-CM | POA: Diagnosis not present

## 2017-07-12 DIAGNOSIS — K625 Hemorrhage of anus and rectum: Secondary | ICD-10-CM

## 2017-07-12 DIAGNOSIS — K909 Intestinal malabsorption, unspecified: Secondary | ICD-10-CM | POA: Insufficient documentation

## 2017-07-12 DIAGNOSIS — D500 Iron deficiency anemia secondary to blood loss (chronic): Secondary | ICD-10-CM

## 2017-07-12 DIAGNOSIS — K922 Gastrointestinal hemorrhage, unspecified: Secondary | ICD-10-CM | POA: Diagnosis not present

## 2017-07-12 DIAGNOSIS — M5136 Other intervertebral disc degeneration, lumbar region: Secondary | ICD-10-CM | POA: Diagnosis not present

## 2017-07-12 MED ORDER — SODIUM CHLORIDE 0.9 % IV SOLN
510.0000 mg | Freq: Once | INTRAVENOUS | Status: AC
  Administered 2017-07-12: 510 mg via INTRAVENOUS
  Filled 2017-07-12: qty 17

## 2017-07-12 MED ORDER — SODIUM CHLORIDE 0.9 % IV SOLN
Freq: Once | INTRAVENOUS | Status: AC
  Administered 2017-07-12: 13:00:00 via INTRAVENOUS

## 2017-07-12 NOTE — Patient Instructions (Signed)
[facility] at [facility] Discharge Instructions  You saw Dr. Katragadda today.   Thank you for choosing [facility] at [facility] to provide your oncology and hematology care.  To afford each patient quality time with our provider, please arrive at least 15 minutes before your scheduled appointment time.   If you have a lab appointment with the Cancer Center please come in thru the  Main Entrance and check in at the main information desk  You need to re-schedule your appointment should you arrive 10 or more minutes late.  We strive to give you quality time with our providers, and arriving late affects you and other patients whose appointments are after yours.  Also, if you no show three or more times for appointments you may be dismissed from the clinic at the providers discretion.     Again, thank you for choosing [facility].  Our hope is that these requests will decrease the amount of time that you wait before being seen by our physicians.       _____________________________________________________________  Should you have questions after your visit to [facility], please contact our office at [phone] between the hours of 8:30 a.m. and 4:30 p.m.  Voicemails left after 4:30 p.m. will not be returned until the following business day.  For prescription refill requests, have your pharmacy contact our office.       Resources For Cancer Patients and their Caregivers ? American Cancer Society: Can assist with transportation, wigs, general needs, runs Look Good Feel Better.        1-888-227-6333 ? Cancer Care: Provides financial assistance, online support groups, medication/co-pay assistance.  1-800-813-HOPE (4673) ? [facility] Assists [place] cancer patients and their families through emotional , educational and financial support.  336-427-4357 ? [place] DSS Where to apply for  food stamps, Medicaid and utility assistance. 336-342-1394 ? [employer]: Transportation to medical appointments. 336-347-2287 ? Social Security Administration: May apply for disability if have a Stage IV cancer. 336-342-7796 1-800-772-1213 ? [place] Aging, Disability and Transit Services: Assists with nutrition, care and transit needs. 336-349-2343  Cancer Center Support Programs:   > Cancer Support Group  2nd Tuesday of the month 1pm-2pm, Journey Room   > Creative Journey  3rd Tuesday of the month 1130am-1pm, Journey Room     

## 2017-07-12 NOTE — Progress Notes (Signed)
Tolerated infusion w/o adverse reaction.  Alert, in no distress.  VSS.  Discharged ambulatory.  

## 2017-07-12 NOTE — Assessment & Plan Note (Signed)
1.  Iron deficiency anemia: - Colonoscopy on 09/13/2015 showing diverticuli in sigmoid colon, descending colon and transverse colon, internal and external hemorrhoids banded -EGD on 09/13/2015 showing normal esophagus, small hiatal hernia - Last Feraheme infusion in January and February 2019 - Iron deficiency state from a combination of malabsorption and chronic GI loss - Hemoglobin has dropped 11.1 and ferritin has dropped to 11. - We will proceed with 2 more weekly infusions of Feraheme.  We will repeat her CBC, ferritin and iron panel in 3 months.  She was told to come back sooner should she develop any symptoms of severe anemia.

## 2017-07-12 NOTE — Progress Notes (Signed)
[place]  Patient Care Team: Asencion Noble, MD as PCP - General (Internal Medicine) Gala Romney Cristopher Estimable, MD as Consulting Physician (Gastroenterology)  DIAGNOSIS:  Encounter Diagnosis  Name Primary?  . Iron deficiency anemia, unspecified iron deficiency anemia type Yes     CHIEF COMPLIANT: Iron deficiency anemia   HISTORY OF PRESENT ILLNESS:  (From Faythe Casa, NP's note on 03/07/17)      INTERVAL HISTORY: Debra Harris is a 67 y.o. female here for routine follow-up for iron deficiency.    She is here today unaccompanied. She and her husband (Dr. Luna Glasgow) just returned from a "trip around the world" for the past 2 months.    Her ferritin decreased from 90s to 11. Arrangements were made for her to receive 2 doses of IV Feraheme; she will receive dose today.  She will receive 2nd dose about 1 week from now.   Denies any frank bleeding that she is aware of.    Most recent IV iron administration record:  Oncology Flowsheet 09/06/2016 12/06/2016 12/13/2016 03/07/2017  ferumoxytol (FERAHEME) IV 510 mg 510 mg 510 mg 510 mg   Oncology Flowsheet 04/10/2017  ferumoxytol [facility]) IV 510 mg    REVIEW OF SYSTEMS:   Constitutional: Denies fevers, chills or abnormal weight loss.  Positive for tiredness. Eyes: Denies blurriness of vision Ears, nose, mouth, throat, and face: Denies mucositis or sore throat Respiratory: Denies cough, or wheezes.  Shortness of breath on exertion present. Cardiovascular: Denies palpitation, chest discomfort Gastrointestinal:  Denies nausea, heartburn or change in bowel habits.  Occasional rectal bleeding. Skin: Denies abnormal skin rashes Lymphatics: Denies new lymphadenopathy or easy bruising Neurological:Denies numbness, tingling or new weaknesses Behavioral/Psych: Mood is stable, no new changes  Extremities: Positive for lower extremity swelling.   All other systems were reviewed with the patient and are negative.  I have reviewed  the past medical history, past surgical history, social history and family history with the patient and they are unchanged from previous note.  ALLERGIES:  is allergic to injectafer [ferric carboxymaltose].  MEDICATIONS:  Current Outpatient Medications  Medication Sig Dispense Refill  . cetirizine (ZYRTEC) 10 MG tablet Take 10 mg by mouth daily.     Marland Kitchen HYDROcodone-acetaminophen (NORCO) 5-325 MG tablet Take 1 tablet by mouth every 6 (six) hours as needed for moderate pain. 60 tablet 0  . metFORMIN (GLUCOPHAGE) 500 MG tablet Take 1,000 mg by mouth daily with breakfast.     . naproxen sodium (ALEVE) 220 MG tablet Take 220-440 mg by mouth daily as needed.    . TURMERIC PO Take 1 tablet by mouth 2 (two) times daily.    . Wheat Dextrin (BENEFIBER PO) Take by mouth.     No current facility-administered medications for this visit.    Facility-Administered Medications Ordered in Other Visits  Medication Dose Route Frequency Provider Last Rate Last Dose  . ferumoxytol (FERAHEME) 510 mg in sodium chloride 0.9 % 100 mL IVPB  510 mg Intravenous Once Holley Bouche, NP 468 mL/hr at 07/12/17 1348 510 mg at 07/12/17 1348    PHYSICAL EXAMINATION: ECOG PERFORMANCE STATUS: 1 - Symptomatic but completely ambulatory  Vitals were reviewed by me. GENERAL:alert, no distress and comfortable SKIN: skin color, texture, turgor are normal, no rashes or significant lesions EYES: normal, Conjunctiva are pink and non-injected, sclera clear    LABORATORY DATA:  I have reviewed the data as listed CMP Latest Ref Rng & Units 07/09/2017 04/04/2017 03/01/2017  Glucose 65 - 99  mg/dL 117(H) 130(H) 142(H)  BUN 6 - 20 mg/dL 14 13 16   Creatinine 0.44 - 1.00 mg/dL 0.59 0.64 0.65  Sodium 135 - 145 mmol/L 140 140 137  Potassium 3.5 - 5.1 mmol/L 3.5 3.9 3.8  Chloride 101 - 111 mmol/L 105 105 104  CO2 22 - 32 mmol/L 27 27 22   Calcium 8.9 - 10.3 mg/dL 9.5 9.1 9.3  Total Protein 6.5 - 8.1 g/dL 7.4 7.2 7.3  Total Bilirubin  0.3 - 1.2 mg/dL 0.8 0.4 0.5  Alkaline Phos 38 - 126 U/L 64 63 61  AST 15 - 41 U/L 33 22 25  ALT 14 - 54 U/L 36 27 26   No results found for: EGB151   Lab Results  Component Value Date   WBC 5.7 07/09/2017   HGB 11.1 (L) 07/09/2017   HCT 35.5 (L) 07/09/2017   MCV 86.6 07/09/2017   PLT 278 07/09/2017   NEUTROABS 3.7 07/09/2017    ASSESSMENT & PLAN:  Iron deficiency anemia 1.  Iron deficiency anemia: - Colonoscopy on 09/13/2015 showing diverticuli in sigmoid colon, descending colon and transverse colon, internal and external hemorrhoids banded -EGD on 09/13/2015 showing normal esophagus, small hiatal hernia - Last Feraheme infusion in January and February 2019 - Iron deficiency state from a combination of malabsorption and chronic GI loss - Hemoglobin has dropped 11.1 and ferritin has dropped to 11. - We will proceed with 2 more weekly infusions of Feraheme.  We will repeat her CBC, ferritin and iron panel in 3 months.  She was told to come back sooner should she develop any symptoms of severe anemia.     Orders Placed This Encounter  Procedures  . CBC with Differential/Platelet    Standing Status:   Future    Standing Expiration Date:   07/13/2018  . Comprehensive metabolic panel    Standing Status:   Future    Standing Expiration Date:   07/13/2018    Order Specific Question:   Has the patient fasted?    Answer:   No  . Ferritin    Standing Status:   Future    Standing Expiration Date:   07/13/2018  . Iron and TIBC    Standing Status:   Future    Standing Expiration Date:   07/13/2018   The patient has a good understanding of the overall plan. she agrees with it. she will call with any problems that may develop before the next visit here.  This note includes documentation from Mike Craze, NP, who was present during this patient's office visit and evaluation.  I have reviewed this note for its completeness and accuracy.  I have edited this note accordingly based on my  findings and medical opinion.      Derek Jack, MD 07/12/17

## 2017-07-17 ENCOUNTER — Encounter (INDEPENDENT_AMBULATORY_CARE_PROVIDER_SITE_OTHER): Payer: Self-pay

## 2017-07-19 ENCOUNTER — Ambulatory Visit (HOSPITAL_COMMUNITY): Payer: Medicare Other

## 2017-07-25 ENCOUNTER — Encounter (INDEPENDENT_AMBULATORY_CARE_PROVIDER_SITE_OTHER): Payer: Self-pay | Admitting: Family Medicine

## 2017-07-25 ENCOUNTER — Ambulatory Visit (INDEPENDENT_AMBULATORY_CARE_PROVIDER_SITE_OTHER): Payer: Medicare Other | Admitting: Family Medicine

## 2017-07-25 ENCOUNTER — Other Ambulatory Visit: Payer: Self-pay

## 2017-07-25 ENCOUNTER — Inpatient Hospital Stay (HOSPITAL_COMMUNITY): Payer: Medicare Other

## 2017-07-25 ENCOUNTER — Other Ambulatory Visit (INDEPENDENT_AMBULATORY_CARE_PROVIDER_SITE_OTHER): Payer: Self-pay | Admitting: Family Medicine

## 2017-07-25 ENCOUNTER — Encounter (HOSPITAL_COMMUNITY): Payer: Self-pay

## 2017-07-25 VITALS — BP 125/61 | HR 59 | Resp 16

## 2017-07-25 VITALS — BP 130/76 | HR 63 | Temp 97.7°F | Ht 64.0 in | Wt 208.0 lb

## 2017-07-25 DIAGNOSIS — R7303 Prediabetes: Secondary | ICD-10-CM

## 2017-07-25 DIAGNOSIS — Z6835 Body mass index (BMI) 35.0-35.9, adult: Secondary | ICD-10-CM | POA: Diagnosis not present

## 2017-07-25 DIAGNOSIS — E538 Deficiency of other specified B group vitamins: Secondary | ICD-10-CM | POA: Diagnosis not present

## 2017-07-25 DIAGNOSIS — E119 Type 2 diabetes mellitus without complications: Secondary | ICD-10-CM

## 2017-07-25 DIAGNOSIS — Z0289 Encounter for other administrative examinations: Secondary | ICD-10-CM

## 2017-07-25 DIAGNOSIS — Z1331 Encounter for screening for depression: Secondary | ICD-10-CM | POA: Diagnosis not present

## 2017-07-25 DIAGNOSIS — K922 Gastrointestinal hemorrhage, unspecified: Secondary | ICD-10-CM | POA: Diagnosis not present

## 2017-07-25 DIAGNOSIS — R0602 Shortness of breath: Secondary | ICD-10-CM | POA: Diagnosis not present

## 2017-07-25 DIAGNOSIS — E559 Vitamin D deficiency, unspecified: Secondary | ICD-10-CM

## 2017-07-25 DIAGNOSIS — R5383 Other fatigue: Secondary | ICD-10-CM | POA: Diagnosis not present

## 2017-07-25 DIAGNOSIS — K909 Intestinal malabsorption, unspecified: Secondary | ICD-10-CM | POA: Diagnosis not present

## 2017-07-25 DIAGNOSIS — D500 Iron deficiency anemia secondary to blood loss (chronic): Secondary | ICD-10-CM

## 2017-07-25 DIAGNOSIS — K625 Hemorrhage of anus and rectum: Secondary | ICD-10-CM

## 2017-07-25 MED ORDER — SODIUM CHLORIDE 0.9 % IV SOLN
Freq: Once | INTRAVENOUS | Status: AC
  Administered 2017-07-25: 15:00:00 via INTRAVENOUS

## 2017-07-25 MED ORDER — SODIUM CHLORIDE 0.9 % IV SOLN
510.0000 mg | Freq: Once | INTRAVENOUS | Status: AC
  Administered 2017-07-25: 510 mg via INTRAVENOUS
  Filled 2017-07-25: qty 17

## 2017-07-25 NOTE — Progress Notes (Signed)
Treatment given per orders. Patient tolerated it well without problems. Vitals stable and discharged home from clinic ambulatory. Follow up as scheduled.  

## 2017-07-25 NOTE — Patient Instructions (Signed)
[facility] at [facility] Discharge Instructions  Feraheme given today. Follow up as scheduled.   Thank you for choosing [facility] at [facility] to provide your oncology and hematology care.  To afford each patient quality time with our provider, please arrive at least 15 minutes before your scheduled appointment time.   If you have a lab appointment with the Cancer Center please come in thru the  Main Entrance and check in at the main information desk  You need to re-schedule your appointment should you arrive 10 or more minutes late.  We strive to give you quality time with our providers, and arriving late affects you and other patients whose appointments are after yours.  Also, if you no show three or more times for appointments you may be dismissed from the clinic at the providers discretion.     Again, thank you for choosing [facility].  Our hope is that these requests will decrease the amount of time that you wait before being seen by our physicians.       _____________________________________________________________  Should you have questions after your visit to [facility], please contact our office at [phone] between the hours of 8:30 a.m. and 4:30 p.m.  Voicemails left after 4:30 p.m. will not be returned until the following business day.  For prescription refill requests, have your pharmacy contact our office.       Resources For Cancer Patients and their Caregivers ? American Cancer Society: Can assist with transportation, wigs, general needs, runs Look Good Feel Better.        1-888-227-6333 ? Cancer Care: Provides financial assistance, online support groups, medication/co-pay assistance.  1-800-813-HOPE (4673) ? [facility] Assists [place] cancer patients and their families through emotional , educational and financial support.  336-427-4357 ? [place]  DSS Where to apply for food stamps, Medicaid and utility assistance. 336-342-1394 ? [employer]: Transportation to medical appointments. 336-347-2287 ? Social Security Administration: May apply for disability if have a Stage IV cancer. 336-342-7796 1-800-772-1213 ? [place] Aging, Disability and Transit Services: Assists with nutrition, care and transit needs. 336-349-2343  Cancer Center Support Programs:   > Cancer Support Group  2nd Tuesday of the month 1pm-2pm, Journey Room   > Creative Journey  3rd Tuesday of the month 1130am-1pm, Journey Room    

## 2017-07-26 LAB — LIPID PANEL WITH LDL/HDL RATIO
Cholesterol, Total: 172 mg/dL (ref 100–199)
HDL: 35 mg/dL — AB (ref >39)
LDL CALC: 108 mg/dL — AB (ref 0–99)
LDL/HDL RATIO: 3.1 ratio (ref 0.0–3.2)
TRIGLYCERIDES: 144 mg/dL (ref 0–149)
VLDL CHOLESTEROL CAL: 29 mg/dL (ref 5–40)

## 2017-07-26 LAB — COMPREHENSIVE METABOLIC PANEL
ALBUMIN: 4.5 g/dL (ref 3.6–4.8)
ALK PHOS: 62 IU/L (ref 39–117)
ALT: 24 IU/L (ref 0–32)
AST: 20 IU/L (ref 0–40)
Albumin/Globulin Ratio: 1.8 (ref 1.2–2.2)
BILIRUBIN TOTAL: 0.5 mg/dL (ref 0.0–1.2)
BUN / CREAT RATIO: 20 (ref 12–28)
BUN: 13 mg/dL (ref 8–27)
CHLORIDE: 105 mmol/L (ref 96–106)
CO2: 22 mmol/L (ref 20–29)
Calcium: 9.4 mg/dL (ref 8.7–10.3)
Creatinine, Ser: 0.66 mg/dL (ref 0.57–1.00)
GFR calc non Af Amer: 92 mL/min/{1.73_m2} (ref >59)
GFR, EST AFRICAN AMERICAN: 106 mL/min/{1.73_m2} (ref >59)
GLOBULIN, TOTAL: 2.5 g/dL (ref 1.5–4.5)
Glucose: 91 mg/dL (ref 65–99)
Potassium: 4.1 mmol/L (ref 3.5–5.2)
SODIUM: 142 mmol/L (ref 134–144)
TOTAL PROTEIN: 7 g/dL (ref 6.0–8.5)

## 2017-07-26 LAB — T4, FREE: FREE T4: 1.48 ng/dL (ref 0.82–1.77)

## 2017-07-26 LAB — T3: T3 TOTAL: 106 ng/dL (ref 71–180)

## 2017-07-26 LAB — VITAMIN D 25 HYDROXY (VIT D DEFICIENCY, FRACTURES): Vit D, 25-Hydroxy: 24.4 ng/mL — ABNORMAL LOW (ref 30.0–100.0)

## 2017-07-26 LAB — INSULIN, RANDOM: INSULIN: 21.9 u[IU]/mL (ref 2.6–24.9)

## 2017-07-26 LAB — TSH: TSH: 0.718 u[IU]/mL (ref 0.450–4.500)

## 2017-07-26 LAB — VITAMIN B12: Vitamin B-12: 1446 pg/mL — ABNORMAL HIGH (ref 232–1245)

## 2017-07-27 NOTE — Progress Notes (Signed)
.  Office: [phone]  /  Fax: [phone]   HPI:   Chief Complaint: OBESITY  Debra Harris (MR# [id number]) is a 67 y.o. female who presents on 07/25/2017 for obesity evaluation and treatment. Current BMI is Body mass index is 35.7 kg/m.Debra Harris Kately has struggled with obesity for years and has been unsuccessful in either losing weight or maintaining long term weight loss. Debra Harris attended our information session and states she is currently in the action stage of change and ready to dedicate time achieving and maintaining a healthier weight.   Debra Harris states her family eats meals together she thinks her family will eat healthier with  her her desired weight loss is 58 lbs she started gaining weight after 3rd child her heaviest weight ever was 215 lbs she snacks frequently in the evenings she skips meals frequently she is frequently drinking liquids with calories she frequently eats larger portions than normal  she struggles with emotional eating    Fatigue Debra Harris feels her energy is lower than it should be. This has worsened with weight gain and has not worsened recently. Debra Harris admits to daytime somnolence and  admits to waking up still tired. Patient has a history of obstructive sleep apnea with the use of CPAP. Patent has a history of symptoms of daytime fatigue. Patient generally gets 6 hours of sleep per night, and states they generally have nightime awakenings. Snoring is present. Apneic episodes are present. Epworth Sleepiness Score is 9.  Dyspnea on exertion Debra Harris notes increasing shortness of breath with exercising and seems to be worsening over time with weight gain. She notes getting out of breath sooner with activity than she used to. This has not gotten worse recently. Debra Harris denies orthopnea.  Pre-Diabetes Debra Harris has a diagnosis of pre-diabetes based on her elevated Hgb A1c and was informed this puts her at greater risk of developing diabetes. She is on metformin currently  and continues to work on diet and exercise to decrease risk of diabetes. She denies nausea, vomiting, or hypoglycemia.  Vitamin B12 Deficiency Debra Harris is at high risk of B12 deficiency due to anemia and metformin use, she notes fatigue. She is not a vegetarian and does not have a previous diagnosis of pernicious anemia. She does not have a history of weight loss surgery.   Vitamin D Deficiency Debra Harris has a diagnosis of vitamin D deficiency. She is on Vit D, she notes fatigue and denies nausea, vomiting or muscle weakness.  Depression Screen Debra Harris's Food and Mood (modified PHQ-9) score was  Depression screen PHQ 2/9 07/25/2017  Decreased Interest 1  Down, Depressed, Hopeless 0  PHQ - 2 Score 1  Altered sleeping 3  Tired, decreased energy 2  Change in appetite 1  Feeling bad or failure about yourself  0  Trouble concentrating 0  Moving slowly or fidgety/restless 0  Suicidal thoughts 0  PHQ-9 Score 7  Difficult doing work/chores Not difficult at all    ALLERGIES: Allergies  Allergen Reactions  . Injectafer [Ferric Carboxymaltose]     Has tolerated feraheme without problems    MEDICATIONS: Current Outpatient Medications on File Prior to Visit  Medication Sig Dispense Refill  . acetaminophen (TYLENOL) 500 MG tablet Take 500 mg by mouth every 6 (six) hours as needed.    . ALPRAZolam (XANAX) 0.5 MG tablet Take 0.5 mg by mouth at bedtime as needed for anxiety.    . cetirizine (ZYRTEC) 10 MG tablet Take 10 mg by mouth daily.     Debra Harris HYDROcodone-acetaminophen (  NORCO) 5-325 MG tablet Take 1 tablet by mouth every 6 (six) hours as needed for moderate pain. 60 tablet 0  . metFORMIN (GLUCOPHAGE) 500 MG tablet Take 1,000 mg by mouth daily with breakfast.     . Multiple Vitamins-Minerals (CENTRUM SILVER 50+WOMEN PO) Take 1 tablet by mouth daily.    . naproxen sodium (ALEVE) 220 MG tablet Take 220-440 mg by mouth daily as needed.    . predniSONE (DELTASONE) 10 MG tablet Take 10 mg by mouth. 32  pill dose pak    . simethicone (MYLICON) 299 MG chewable tablet Chew 125 mg by mouth every 6 (six) hours as needed for flatulence.    . Wheat Dextrin (BENEFIBER PO) Take by mouth.     No current facility-administered medications on file prior to visit.     PAST MEDICAL HISTORY: Past Medical History:  Diagnosis Date  . Adenomatous colon polyp   . Allergic rhinitis   . Arthritis   . Back pain   . Constipation   . Diabetes mellitus   . Diverticulosis   . External hemorrhoids   . Fibromyalgia   . Gallbladder problem   . GERD (gastroesophageal reflux disease)   . Glaucoma   . Glaucoma   . Hemorrhoids    grade 3   . Hip pain   . IBS (irritable bowel syndrome)   . IDA (iron deficiency anemia)   . Iron deficiency anemia 06/05/2014  . Irregular heartbeat   . Leg edema   . Leg numbness   . Leg pain   . Low tension glaucoma   . Neuropathy, leg   . Ovarian cyst 11/20/00  . Prediabetes   . Sleep apnea    CPAP  . Sliding hiatal hernia     PAST SURGICAL HISTORY: Past Surgical History:  Procedure Laterality Date  . CARPAL TUNNEL RELEASE Bilateral   . CATARACT EXTRACTION     bilateral  . CHOLECYSTECTOMY    . COLONOSCOPY  2005   Small external hemorrhoids, single sigmoid colon diverticulum, small polyp at the hepatic flexure focally adenomatous.  . COLONOSCOPY  2010   Dr. Gala Romney, Versed 4/Demerol 75. Pancolonic diverticula, external hemorrhoids. Colonoscopy every 5 years for history of adenomatous polyps  . COLONOSCOPY  2014   Dr. Olevia Perches: Propofol. mild diverticulosis in the sigmoid colon, random colon biopsies benign.  . COLONOSCOPY WITH PROPOFOL N/A 09/13/2015   Procedure: COLONOSCOPY WITH PROPOFOL;  Surgeon: Daneil Dolin, MD;  Location: [facility];  Service: Endoscopy;  Laterality: N/A;  1030  . ESOPHAGEAL DILATION N/A 10/26/2014   Procedure: ESOPHAGEAL DILATION [place];  Surgeon: Daneil Dolin, MD;  Location: [facility];  Service: Endoscopy;  Laterality: N/A;  .  ESOPHAGOGASTRODUODENOSCOPY  2010   Dr. Gala Romney, distal esophageal erosions, Venia Minks dilator passed. Comment small hiatal hernia, patulous EG junction.  . ESOPHAGOGASTRODUODENOSCOPY (EGD) WITH PROPOFOL N/A 10/26/2014   Dr.Rourk- distal esophageal erosions c/w mild erosive reflux esophagitis. hiatal hernia, o/w normal EGD  . ESOPHAGOGASTRODUODENOSCOPY (EGD) WITH PROPOFOL N/A 09/13/2015   Procedure: ESOPHAGOGASTRODUODENOSCOPY (EGD) WITH PROPOFOL;  Surgeon: Daneil Dolin, MD;  Location: [facility];  Service: Endoscopy;  Laterality: N/A;  . GIVENS CAPSULE STUDY N/A 10/26/2014   couple of mucosal breaks/erosions but essentially unremarkable small bowel study  . HEMORRHOID BANDING    . KNEE SURGERY     right-arthroscopy  . OOPHORECTOMY  age 17s  . PARTIAL HYSTERECTOMY  age 73s  . RECTAL EXAM UNDER ANESTHESIA N/A 10/26/2014   circumferential grade  3 hemoorrhoids easily reducible  . thumb surger Bilateral    trigger thumb    SOCIAL HISTORY: Social History   Tobacco Use  . Smoking status: Never Smoker  . Smokeless tobacco: Never Used  . Tobacco comment: Pt reports tried once in 7th grade//ms  Substance Use Topics  . Alcohol use: Yes    Alcohol/week: 0.6 oz    Types: 1 Glasses of wine per week    Comment: rare  . Drug use: No    FAMILY HISTORY: Family History  Problem Relation Age of Onset  . Ulcers Mother   . Glaucoma Mother   . Heart disease Mother   . Diabetes Mother   . Depression Mother   . Sudden death Mother   . Obesity Mother   . Glaucoma Maternal Grandmother   . Heart disease Maternal Grandmother   . Heart disease Maternal Grandfather   . Colon cancer Neg Hx     ROS: Review of Systems  Constitutional: Positive for malaise/fatigue. Negative for weight loss.       + Trouble sleeping + Breasts lumps  HENT:       + Decreasing hearing + Rushing left ear on occasions + Nasal stuffiness + Difficult or painful swallowing + Dry mouth (burning mouth) + Mouth sores +  Hoarseness  Eyes: Positive for blurred vision, double vision and pain.       + Vision changes + Wear glasses or contacts + Floaters  Respiratory: Positive for shortness of breath (with exertion) and wheezing.        + Painful or difficulty breathing  Cardiovascular: Negative for orthopnea.       + Chest tightness + Difficulty breathing while lying down + Sudden awakening from sleep with shortness of breath + Calf/leg pain with walking + Leg cramping + Very cold feet or hands  Gastrointestinal: Positive for constipation (occasional), diarrhea (occasional), heartburn and nausea. Negative for vomiting.       + Swallowing difficulty + Rectal bleeding  Genitourinary: Positive for frequency.  Musculoskeletal: Positive for back pain and neck pain.       Negative muscle weakness + Neck stiffness + Muscle or joint pain + Muscle stiffness + Red or swollen joints  Neurological: Positive for weakness.  Endo/Heme/Allergies: Positive for polydipsia. Bruises/bleeds easily (continous bleeding ).       Negative hypoglycemia    PHYSICAL EXAM: Blood pressure 130/76, pulse 63, temperature 97.7 F (36.5 C), temperature source Oral, height 5\' 4"  (1.626 m), weight 208 lb (94.3 kg), SpO2 100 %. Body mass index is 35.7 kg/m. Physical Exam  Constitutional: She is oriented to person, place, and time. She appears well-developed and well-nourished.  HENT:  Head: Normocephalic and atraumatic.  Nose: Nose normal.  Eyes: EOM are normal. No scleral icterus.  Neck: Normal range of motion. Neck supple. No thyromegaly present.  Cardiovascular: Normal rate and regular rhythm.  Pulmonary/Chest: Effort normal. No respiratory distress.  Abdominal: Soft. There is no tenderness.  + Obesity  Musculoskeletal:  Range of Motion normal in all 4 extremities Trace edema noted in bilateral lower extremities  Neurological: She is alert and oriented to person, place, and time. Coordination normal.  Skin: Skin is warm  and dry.  Psychiatric: She has a normal mood and affect. Her behavior is normal.  Vitals reviewed.   RECENT LABS AND TESTS: BMET    Component Value Date/Time   NA 142 07/25/2017 1239   K 4.1 07/25/2017 1239   CL 105 07/25/2017 1239  CO2 22 07/25/2017 1239   GLUCOSE 91 07/25/2017 1239   GLUCOSE 117 (H) 07/09/2017 1335   BUN 13 07/25/2017 1239   CREATININE 0.66 07/25/2017 1239   CALCIUM 9.4 07/25/2017 1239   GFRNONAA 92 07/25/2017 1239   GFRAA 106 07/25/2017 1239   Lab Results  Component Value Date   HGBA1C 5.5 07/09/2017   Lab Results  Component Value Date   INSULIN 21.9 07/25/2017   CBC    Component Value Date/Time   WBC 5.7 07/09/2017 1335   RBC 4.10 07/09/2017 1335   HGB 11.1 (L) 07/09/2017 1335   HCT 35.5 (L) 07/09/2017 1335   PLT 278 07/09/2017 1335   MCV 86.6 07/09/2017 1335   MCH 27.1 07/09/2017 1335   MCHC 31.3 07/09/2017 1335   RDW 13.5 07/09/2017 1335   LYMPHSABS 1.4 07/09/2017 1335   MONOABS 0.5 07/09/2017 1335   EOSABS 0.2 07/09/2017 1335   BASOSABS 0.0 07/09/2017 1335   Iron/TIBC/Ferritin/ %Sat    Component Value Date/Time   IRON 33 07/09/2017 1335   TIBC 458 (H) 07/09/2017 1335   FERRITIN 11 07/09/2017 1335   IRONPCTSAT 7 (L) 07/09/2017 1335   Lipid Panel     Component Value Date/Time   CHOL 172 07/25/2017 1246   TRIG 144 07/25/2017 1246   HDL 35 (L) 07/25/2017 1246   LDLCALC 108 (H) 07/25/2017 1246   Hepatic Function Panel     Component Value Date/Time   PROT 7.0 07/25/2017 1239   ALBUMIN 4.5 07/25/2017 1239   AST 20 07/25/2017 1239   ALT 24 07/25/2017 1239   ALKPHOS 62 07/25/2017 1239   BILITOT 0.5 07/25/2017 1239      Component Value Date/Time   TSH 0.718 07/25/2017 1239   Vitamin D No recent labs  ECG  shows NSR with a rate of 60 BPM INDIRECT CALORIMETER done today shows a VO2 of 174 and a REE of 1214. Her calculated basal metabolic rate is 6045 thus her basal metabolic rate is worse than expected.    ASSESSMENT  AND PLAN: Other fatigue - Plan: EKG 12-Lead, Lipid Panel With LDL/HDL Ratio, T3, T4, free, TSH  Shortness of breath on exertion  Prediabetes - Plan: Comprehensive metabolic panel, Insulin, random  Vitamin D deficiency - Plan: VITAMIN D 25 Hydroxy (Vit-D Deficiency, Fractures)  B12 nutritional deficiency - Plan: Vitamin B12  Depression screening  Class 2 severe obesity with serious comorbidity and body mass index (BMI) of 35.0 to 35.9 in adult, unspecified obesity type (HCC)  PLAN:  Fatigue Jamisen was informed that her fatigue may be related to obesity, depression or many other causes. Labs will be ordered, and in the meanwhile Krystine has agreed to work on diet, exercise and weight loss to help with fatigue. Proper sleep hygiene was discussed including the need for 7-8 hours of quality sleep each night. A sleep study was not ordered based on symptoms and Epworth score.  Dyspnea on exertion Aryelle's shortness of breath appears to be obesity related and exercise induced. She has agreed to work on weight loss and gradually increase exercise to treat her exercise induced shortness of breath. If Maddilynn follows our instructions and loses weight without improvement of her shortness of breath, we will plan to refer to pulmonology. We will monitor this condition regularly. Iysis agrees to this plan.  Pre-Diabetes Analycia will continue to work on weight loss, exercise, and decreasing simple carbohydrates in her diet to help decrease the risk of diabetes. We dicussed metformin including benefits and  risks. She was informed that eating too many simple carbohydrates or too many calories at one sitting increases the likelihood of GI side effects. Moria will start diet prescription and continue taking metformin. We will check labs and Kataryna agrees to follow up with our clinic in 2 weeks as directed to monitor her progress.  Vitamin B12 Deficiency Yasheka will work on increasing B12 rich foods in her  diet. B12 supplementation was not prescribed today. We will check labs and Hailynn agrees to follow up with our clinic in 2 weeks.  Vitamin D Deficiency Kenslei was informed that low vitamin D levels contributes to fatigue and are associated with obesity, breast, and colon cancer. She will follow up for routine testing of vitamin D, at least 2-3 times per year. She was informed of the risk of over-replacement of vitamin D and agrees to not increase her dose unless she discusses this with [name] first. We will check labs and Ernisha agrees to follow up with our clinic in 2 weeks.  Depression Screen Kylee had a mildly positive depression screening. Depression is commonly associated with obesity and often results in emotional eating behaviors. We will monitor this closely and work on CBT to help improve the non-hunger eating patterns. Referral to Psychology may be required if no improvement is seen as she continues in our clinic.  Obesity Kiaira is currently in the action stage of change and her goal is to continue with weight loss efforts She has agreed to follow the Category 1 plan + 100 calories Hanne has been instructed to work up to a goal of 150 minutes of combined cardio and strengthening exercise per week for weight loss and overall health benefits. We discussed the following Behavioral Modification Strategies today: increasing lean protein intake, decreasing simple carbohydrates  and work on meal planning and easy cooking plans  Ayaka has agreed to follow up with our clinic in 2 weeks. She was informed of the importance of frequent follow up visits to maximize her success with intensive lifestyle modifications for her multiple health conditions. She was informed we would discuss her lab results at her next visit unless there is a critical issue that needs to be addressed sooner. Brelyn agreed to keep her next visit at the agreed upon time to discuss these results.    OBESITY BEHAVIORAL  INTERVENTION VISIT  Today's visit was # 1 out of 22.  Starting weight: 208 lbs Starting date: 07/25/17 Today's weight : 208 lbs  Today's date: 07/25/2017 Total lbs lost to date: 0 (Patients must lose 7 lbs in the first 6 months to continue with counseling)   ASK: We discussed the diagnosis of obesity with Ambrose Pancoast today and Edithe agreed to give [name] permission to discuss obesity behavioral modification therapy today.  ASSESS: Shalla has the diagnosis of obesity and her BMI today is 35.69 Danyka is in the action stage of change   ADVISE: Braxton was educated on the multiple health risks of obesity as well as the benefit of weight loss to improve her health. She was advised of the need for long term treatment and the importance of lifestyle modifications.  AGREE: Multiple dietary modification options and treatment options were discussed and  Rakisha agreed to the above obesity treatment plan.   I, Trixie Dredge, am acting as transcriptionist for Dennard Nip, MD  I have reviewed the above documentation for accuracy and completeness, and I agree with the above. -Dennard Nip, MD

## 2017-08-08 ENCOUNTER — Ambulatory Visit (INDEPENDENT_AMBULATORY_CARE_PROVIDER_SITE_OTHER): Payer: Medicare Other | Admitting: Family Medicine

## 2017-08-08 VITALS — BP 109/68 | HR 62 | Temp 97.8°F | Ht 64.0 in | Wt 200.0 lb

## 2017-08-08 DIAGNOSIS — Z6834 Body mass index (BMI) 34.0-34.9, adult: Secondary | ICD-10-CM

## 2017-08-08 DIAGNOSIS — E559 Vitamin D deficiency, unspecified: Secondary | ICD-10-CM

## 2017-08-08 DIAGNOSIS — E669 Obesity, unspecified: Secondary | ICD-10-CM

## 2017-08-08 DIAGNOSIS — R7303 Prediabetes: Secondary | ICD-10-CM

## 2017-08-08 DIAGNOSIS — Z9189 Other specified personal risk factors, not elsewhere classified: Secondary | ICD-10-CM | POA: Diagnosis not present

## 2017-08-08 MED ORDER — VITAMIN D (ERGOCALCIFEROL) 1.25 MG (50000 UNIT) PO CAPS: 50000.0000 [IU] | ORAL_CAPSULE | ORAL | 0 refills | Status: DC

## 2017-08-09 NOTE — Progress Notes (Signed)
Office: [phone]  /  Fax: [phone]   HPI:   Chief Complaint: OBESITY Debra Harris is here to discuss her progress with her obesity treatment plan. She is on the Category 1 plan  +100 calories and is following her eating plan approximately 99.5 % of the time. She states she is bicycling for 30 minutes 3 times per week. Debra Harris did very well with weight loss on the category 1 plan. Hunger was controlled well and patient feels she was eating more food than normal and was still losing weight. Her weight is 200 lb (90.7 kg) today and has had a weight loss of 8 pounds over a period of 2 weeks since her last visit. She has lost 8 lbs since starting treatment with [name].  Vitamin D deficiency (new) Debra Harris has a new diagnosis of vitamin D deficiency. She is not currently taking vit D and denies nausea, vomiting or muscle weakness.  Pre-Diabetes Debra Harris has already been diagnosed with prediabetes and is on metformin currently. Debra Harris was informed this puts her at greater risk of developing diabetes. Her A1c is controlled at 5.5. She continues to work on diet and exercise to decrease risk of diabetes. She denies nausea or hypoglycemia.  At risk for diabetes Debra Harris is at higher than average risk for developing diabetes due to her obesity and pre-diabetes. She currently denies polyuria or polydipsia.  ALLERGIES: Allergies  Allergen Reactions  . Injectafer [Ferric Carboxymaltose]     Has tolerated feraheme without problems    MEDICATIONS: Current Outpatient Medications on File Prior to Visit  Medication Sig Dispense Refill  . acetaminophen (TYLENOL) 500 MG tablet Take 500 mg by mouth every 6 (six) hours as needed.    . ALPRAZolam (XANAX) 0.5 MG tablet Take 0.5 mg by mouth at bedtime as needed for anxiety.    . cetirizine (ZYRTEC) 10 MG tablet Take 10 mg by mouth daily.     Marland Kitchen HYDROcodone-acetaminophen (NORCO) 5-325 MG tablet Take 1 tablet by mouth every 6 (six) hours as needed for moderate pain. 60  tablet 0  . metFORMIN (GLUCOPHAGE) 500 MG tablet Take 1,000 mg by mouth daily with breakfast.     . Multiple Vitamins-Minerals (CENTRUM SILVER 50+WOMEN PO) Take 1 tablet by mouth daily.    . naproxen sodium (ALEVE) 220 MG tablet Take 220-440 mg by mouth daily as needed.    . simethicone (MYLICON) 361 MG chewable tablet Chew 125 mg by mouth every 6 (six) hours as needed for flatulence.    . Wheat Dextrin (BENEFIBER PO) Take by mouth.     No current facility-administered medications on file prior to visit.     PAST MEDICAL HISTORY: Past Medical History:  Diagnosis Date  . Adenomatous colon polyp   . Allergic rhinitis   . Arthritis   . Back pain   . Constipation   . Diabetes mellitus   . Diverticulosis   . External hemorrhoids   . Fibromyalgia   . Gallbladder problem   . GERD (gastroesophageal reflux disease)   . Glaucoma   . Glaucoma   . Hemorrhoids    grade 3   . Hip pain   . IBS (irritable bowel syndrome)   . IDA (iron deficiency anemia)   . Iron deficiency anemia 06/05/2014  . Irregular heartbeat   . Leg edema   . Leg numbness   . Leg pain   . Low tension glaucoma   . Neuropathy, leg   . Ovarian cyst 11/20/00  . Prediabetes   .  Sleep apnea    CPAP  . Sliding hiatal hernia     PAST SURGICAL HISTORY: Past Surgical History:  Procedure Laterality Date  . CARPAL TUNNEL RELEASE Bilateral   . CATARACT EXTRACTION     bilateral  . CHOLECYSTECTOMY    . COLONOSCOPY  2005   Small external hemorrhoids, single sigmoid colon diverticulum, small polyp at the hepatic flexure focally adenomatous.  . COLONOSCOPY  2010   Dr. Gala Romney, Versed 4/Demerol 75. Pancolonic diverticula, external hemorrhoids. Colonoscopy every 5 years for history of adenomatous polyps  . COLONOSCOPY  2014   Dr. Olevia Perches: Propofol. mild diverticulosis in the sigmoid colon, random colon biopsies benign.  . COLONOSCOPY WITH PROPOFOL N/A 09/13/2015   Procedure: COLONOSCOPY WITH PROPOFOL;  Surgeon: Daneil Dolin,  MD;  [facility]: [facility];  Service: Endoscopy;  Laterality: N/A;  1030  . ESOPHAGEAL DILATION N/A 10/26/2014   Procedure: ESOPHAGEAL DILATION [place];  Surgeon: Daneil Dolin, MD;  Location: [facility];  Service: Endoscopy;  Laterality: N/A;  . ESOPHAGOGASTRODUODENOSCOPY  2010   Dr. Gala Romney, distal esophageal erosions, Venia Minks dilator passed. Comment small hiatal hernia, patulous EG junction.  . ESOPHAGOGASTRODUODENOSCOPY (EGD) WITH PROPOFOL N/A 10/26/2014   Dr.Rourk- distal esophageal erosions c/w mild erosive reflux esophagitis. hiatal hernia, o/w normal EGD  . ESOPHAGOGASTRODUODENOSCOPY (EGD) WITH PROPOFOL N/A 09/13/2015   Procedure: ESOPHAGOGASTRODUODENOSCOPY (EGD) WITH PROPOFOL;  Surgeon: Daneil Dolin, MD;  [facility];  Service: Endoscopy;  Laterality: N/A;  . GIVENS CAPSULE STUDY N/A 10/26/2014   couple of mucosal breaks/erosions but essentially unremarkable small bowel study  . HEMORRHOID BANDING    . KNEE SURGERY     right-arthroscopy  . OOPHORECTOMY  age 76s  . PARTIAL HYSTERECTOMY  age 29s  . RECTAL EXAM UNDER ANESTHESIA N/A 10/26/2014   circumferential grade 3 hemoorrhoids easily reducible  . thumb surger Bilateral    trigger thumb    SOCIAL HISTORY: Social History   Tobacco Use  . Smoking status: Never Smoker  . Smokeless tobacco: Never Used  . Tobacco comment: Pt reports tried once in 7th grade//ms  Substance Use Topics  . Alcohol use: Yes    Alcohol/week: 0.6 oz    Types: 1 Glasses of wine per week    Comment: rare  . Drug use: No    FAMILY HISTORY: Family History  Problem Relation Age of Onset  . Ulcers Mother   . Glaucoma Mother   . Heart disease Mother   . Diabetes Mother   . Depression Mother   . Sudden death Mother   . Obesity Mother   . Glaucoma Maternal Grandmother   . Heart disease Maternal Grandmother   . Heart disease Maternal Grandfather   . Colon cancer Neg Hx     ROS: Review of Systems  Constitutional: Positive for weight  loss.  Gastrointestinal: Negative for nausea and vomiting.  Genitourinary: Negative for frequency.  Musculoskeletal:       Negative for muscle weakness  Endo/Heme/Allergies: Negative for polydipsia.       Negative for hypoglycemia    PHYSICAL EXAM: Blood pressure 109/68, pulse 62, temperature 97.8 F (36.6 C), temperature source Oral, height 5\' 4"  (1.626 m), weight 200 lb (90.7 kg), SpO2 97 %. Body mass index is 34.33 kg/m. Physical Exam  Constitutional: She is oriented to person, place, and time. She appears well-developed and well-nourished.  Cardiovascular: Normal rate.  Pulmonary/Chest: Effort normal.  Musculoskeletal: Normal range of motion.  Neurological: She is oriented to person, place,  and time.  Skin: Skin is warm and dry.  Psychiatric: She has a normal mood and affect. Her behavior is normal.  Vitals reviewed.   RECENT LABS AND TESTS: BMET    Component Value Date/Time   NA 142 07/25/2017 1239   K 4.1 07/25/2017 1239   CL 105 07/25/2017 1239   CO2 22 07/25/2017 1239   GLUCOSE 91 07/25/2017 1239   GLUCOSE 117 (H) 07/09/2017 1335   BUN 13 07/25/2017 1239   CREATININE 0.66 07/25/2017 1239   CALCIUM 9.4 07/25/2017 1239   GFRNONAA 92 07/25/2017 1239   GFRAA 106 07/25/2017 1239   Lab Results  Component Value Date   HGBA1C 5.5 07/09/2017   Lab Results  Component Value Date   INSULIN 21.9 07/25/2017   CBC    Component Value Date/Time   WBC 5.7 07/09/2017 1335   RBC 4.10 07/09/2017 1335   HGB 11.1 (L) 07/09/2017 1335   HCT 35.5 (L) 07/09/2017 1335   PLT 278 07/09/2017 1335   MCV 86.6 07/09/2017 1335   MCH 27.1 07/09/2017 1335   MCHC 31.3 07/09/2017 1335   RDW 13.5 07/09/2017 1335   LYMPHSABS 1.4 07/09/2017 1335   MONOABS 0.5 07/09/2017 1335   EOSABS 0.2 07/09/2017 1335   BASOSABS 0.0 07/09/2017 1335   Iron/TIBC/Ferritin/ %Sat    Component Value Date/Time   IRON 33 07/09/2017 1335   TIBC 458 (H) 07/09/2017 1335   FERRITIN 11 07/09/2017 1335    IRONPCTSAT 7 (L) 07/09/2017 1335   Lipid Panel     Component Value Date/Time   CHOL 172 07/25/2017 1246   TRIG 144 07/25/2017 1246   HDL 35 (L) 07/25/2017 1246   LDLCALC 108 (H) 07/25/2017 1246   Hepatic Function Panel     Component Value Date/Time   PROT 7.0 07/25/2017 1239   ALBUMIN 4.5 07/25/2017 1239   AST 20 07/25/2017 1239   ALT 24 07/25/2017 1239   ALKPHOS 62 07/25/2017 1239   BILITOT 0.5 07/25/2017 1239      Component Value Date/Time   TSH 0.718 07/25/2017 1239   TSH 0.71 10/22/2012 1030   Results for HAELEE, BOLEN D "SANDY" (MRN [id number]) as of 08/09/2017 07:35  Ref. Range 07/25/2017 12:39  Vitamin D, 25-Hydroxy Latest Ref Range: 30.0 - 100.0 ng/mL 24.4 (L)   ASSESSMENT AND PLAN: Vitamin D deficiency  Prediabetes  At risk for diabetes mellitus  Class 1 obesity with serious comorbidity and body mass index (BMI) of 34.0 to 34.9 in adult, unspecified obesity type  PLAN:  Vitamin D Deficiency (new) Valora was informed that low vitamin D levels contributes to fatigue and are associated with obesity, breast, and colon cancer. She agrees to continue to take prescription Vit D @50 ,000 IU every week and will follow up for routine testing of vitamin D, at least 2-3 times per year. She was informed of the risk of over-replacement of vitamin D and agrees to not increase her dose unless she discusses this with [name] first.  Pre-Diabetes Huma will continue to work on weight loss, exercise, and decreasing simple carbohydrates in her diet to help decrease the risk of diabetes. We dicussed metformin including benefits and risks. She was informed that eating too many simple carbohydrates or too many calories at one sitting increases the likelihood of GI side effects. Kyomi will continue metformin and will follow up with [name] as directed to monitor her progress.  Diabetes risk counseling Majesta was given extended (30 minutes) diabetes prevention counseling today. She is 67  y.o.  female and has risk factors for diabetes including obesity and pre-diabetes. We discussed intensive lifestyle modifications today with an emphasis on weight loss as well as increasing exercise and decreasing simple carbohydrates in her diet.  Obesity Aveline is currently in the action stage of change. As such, her goal is to continue with weight loss efforts She has agreed to follow the Category 1 plan Jariyah has been instructed to work up to a goal of 150 minutes of combined cardio and strengthening exercise per week for weight loss and overall health benefits. We discussed the following Behavioral Modification Strategies today: increasing lean protein intake, decreasing simple carbohydrates  and work on meal planning and easy cooking plans  Zahara has agreed to follow up with our clinic in 2 to 3 weeks. She was informed of the importance of frequent follow up visits to maximize her success with intensive lifestyle modifications for her multiple health conditions.   OBESITY BEHAVIORAL INTERVENTION VISIT  Today's visit was # 2 out of 22.  Starting weight: 208 lbs Starting date: 07/25/17 Today's weight : 200 lbs Today's date: 08/08/2017 Total lbs lost to date: 8 (Patients must lose 7 lbs in the first 6 months to continue with counseling)   ASK: We discussed the diagnosis of obesity with Garnette Gunner today and Katharine Look agreed to give [name] permission to discuss obesity behavioral modification therapy today.  ASSESS: Willard has the diagnosis of obesity and her BMI today is 34.31 [place] is in the action stage of change   ADVISE: Archana was educated on the multiple health risks of obesity as well as the benefit of weight loss to improve her health. She was advised of the need for long term treatment and the importance of lifestyle modifications.  AGREE: Multiple dietary modification options and treatment options were discussed and  Elnor agreed to the above obesity treatment plan.  I,  Doreene Nest, am acting as transcriptionist for Dennard Nip, MD  I have reviewed the above documentation for accuracy and completeness, and I agree with the above. -Dennard Nip, MD

## 2017-08-21 NOTE — Progress Notes (Signed)
@Patient  ID: Debra Harris, female    DOB: [birth year], 67 y.o.   MRN: [id number]  Chief Complaint  Patient presents with  . Follow-up    Pt is doing well overall with cpap machine.    Referring provider: Asencion Noble, MD  HPI: 67 year old female patient seen in office today for obstructive sleep apnea / CPAP follow up. 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    Recent [facility] encounters:   05/10/16 - OV - Nest[place]  Severe OSA: Patient currently prescribed CPAP therapy. Patient previously using full face mask. Sleep study was completed in 2012. Patient has had some difficulty with her current machine and is using a family members machine. She reports she is sleeping well with her machine and has excellent sleep quality. She is using heated humidification.  Seasonal allergic rhinitis: Previously on Zyrtec. She reports no significant sinus congestion, pressure, or drainage. She does use benadryl sparingly.  GERD with hiatal hernia: Previously on Dexilant. Esophagitis seen on EGD. She reports she was taken off her PPI due to bleeding. She reports she still was having reflux on the medication. She is taking OTC Prilosec. She does have occasional morning brash water taste. Previously seen by GI but reportedly discharged when they couldn't find the source of her bleeding.    Tests:   POLYSOMNOGRAM 11/25/2010:  AHI 68.6 events/hr. low saturation 81% during REM sleep. Periodic limb movement index 0. Average heart rate 69 bpm with no significant dysrhythmias noted. Optimal CPAP 13cm H2O.  COMPLIANCE DOWNLOAD 02/07/16 - 05/06/16:  44% usage. Average usage on days used 8 hours 32 minutes. AutoSet with minimum pressure 5 cm H2O & maximum pressure 15 cm H2O. Maximum leaks 55.6. Residual AHI 0.5. 02/10/16 - 05/09/16: 56% usage. AutoSet 5 cm H2O pressure minimum & 11 cm H2O maximum. Maximum leaks 62.3. Residual AHI 0.5. Average usage on days used 7 hours 25 minutes. 10/15/14 - 01/12/15:  S9 Autoset CPAP.  Maximum pressure of 15 cm H2O & minimum pressure 57 m H2O. Residual AHI 0.8 events/hour. 99% usage with greater than 4 hours. Used 89 out of 90 days.    08/22/17 OV - CPAP  Pleasant 67 year old patient seen in office today for obstructive sleep apnea/CPAP follow-up.  Patient last seen by clients in [place].  Patient reporting no issues with her CPAP.  Patient uses CPAP here in [place] nightly and then whenever she goes to [place] she has her brothers CPAP that she uses.  Patient forgot to bring that ST card for that CPAP today.  Patient compliance report showing 70 days used out of the last 90, 66 of those days greater than 4 hours.  Daily average use 7 hours and 53 minutes.  AHI 0.8. Pt reports that breaks in compliance report are from when pt visits [facility] and uses CPAP there.   Patient reports that she recently completed a trip around the world.  And is still adjusting from all the time changes.  Trip lasted over 2-1/2 months.  Patient reports that since then patient has been sleeping about 4 to 8 hours a night.  Patient also actively working with dietitian and weight loss doctor to help improve weight.    Allergies  Allergen Reactions  . Injectafer [Ferric Carboxymaltose]     Has tolerated feraheme without problems    Immunization History  Administered Date(s) Administered  . Influenza Split 12/28/2012, 11/27/2014, 11/22/2016  . Influenza Whole 11/28/2011  . Influenza,inj,Quad PF,6+ Mos 10/29/2013  . Influenza-Unspecified 02/11/2016  Past Medical History:  Diagnosis Date  . Adenomatous colon polyp   . Allergic rhinitis   . Arthritis   . Back pain   . Constipation   . Diabetes mellitus   . Diverticulosis   . External hemorrhoids   . Fibromyalgia   . Gallbladder problem   . GERD (gastroesophageal reflux disease)   . Glaucoma   . Glaucoma   . Hemorrhoids    grade 3   . Hip pain   . IBS (irritable bowel syndrome)   . IDA (iron deficiency anemia)   . Iron deficiency  anemia 06/05/2014  . Irregular heartbeat   . Leg edema   . Leg numbness   . Leg pain   . Low tension glaucoma   . Neuropathy, leg   . Ovarian cyst 11/20/00  . Prediabetes   . Sleep apnea    CPAP  . Sliding hiatal hernia     Tobacco History: Social History   Tobacco Use  Smoking Status Never Smoker  Smokeless Tobacco Never Used  Tobacco Comment   Pt reports tried once in 7th grade//ms   Counseling given: Yes Comment: Pt reports tried once in 7th grade//ms Continue not smoking.   Outpatient Encounter Medications as of 08/22/2017  Medication Sig  . acetaminophen (TYLENOL) 500 MG tablet Take 500 mg by mouth every 6 (six) hours as needed.  . cetirizine (ZYRTEC) 10 MG tablet Take 10 mg by mouth daily.   Marland Kitchen HYDROcodone-acetaminophen (NORCO) 5-325 MG tablet Take 1 tablet by mouth every 6 (six) hours as needed for moderate pain.  . metFORMIN (GLUCOPHAGE) 500 MG tablet Take 1,000 mg by mouth daily with breakfast.   . naproxen sodium (ALEVE) 220 MG tablet Take 220-440 mg by mouth daily as needed.  . Vitamin D, Ergocalciferol, (DRISDOL) 50000 units CAPS capsule Take 1 capsule (50,000 Units total) by mouth every 7 (seven) days. (Patient not taking: Reported on 08/22/2017)  . [DISCONTINUED] ALPRAZolam (XANAX) 0.5 MG tablet Take 0.5 mg by mouth at bedtime as needed for anxiety.  . [DISCONTINUED] Multiple Vitamins-Minerals (CENTRUM SILVER 50+WOMEN PO) Take 1 tablet by mouth daily.  . [DISCONTINUED] simethicone (MYLICON) 010 MG chewable tablet Chew 125 mg by mouth every 6 (six) hours as needed for flatulence.  . [DISCONTINUED] Wheat Dextrin (BENEFIBER PO) Take by mouth.   No facility-administered encounter medications on file as of 08/22/2017.      Review of Systems  Constitutional: +poor sleep at times after trip  No  weight loss, night sweats,  fevers, chills, fatigue, or  lassitude HEENT:   No headaches,  Difficulty swallowing,  Tooth/dental problems, or  Sore throat, No sneezing, itching,  ear ache, nasal congestion, post nasal drip  CV: No chest pain,  orthopnea, PND, swelling in lower extremities, anasarca, dizziness, palpitations, syncope  GI: No heartburn, indigestion, abdominal pain, nausea, vomiting, diarrhea, change in bowel habits, loss of appetite, bloody stools Resp: No shortness of breath with exertion or at rest.  No excess mucus, no productive cough,  No non-productive cough,  No coughing up of blood.  No change in color of mucus.  No wheezing.  No chest wall deformity Skin: no rash, lesions, no skin changes. GU: no dysuria, change in color of urine, no urgency or frequency.  No flank pain, no hematuria  MS:  No joint pain or swelling.  No decreased range of motion.  No back pain. Psych:  No change in mood or affect. No depression or anxiety.  No memory loss.  Physical Exam  BP 100/70 (BP Location: Left Arm, Cuff Size: Normal)   Pulse (!) 59   Ht 5\' 3"  (1.6 m)   Wt 199 lb (90.3 kg)   SpO2 97%   BMI 35.25 kg/m   Wt Readings from Last 3 Encounters:  08/22/17 199 lb (90.3 kg)  08/08/17 200 lb (90.7 kg)  07/25/17 208 lb (94.3 kg)   >>>praised pt on weight loss    GEN: A/Ox3; pleasant , NAD, well nourished    HEENT:  [place]/AT,  EACs-clear, TMs-wnl, NOSE-clear, THROAT-clear, no lesions, no postnasal drip or exudate noted.   NECK:  Supple w/ fair ROM; no JVD; no lymphadenopathy.    RESP:  Clear  P & A; w/o, wheezes/ rales/ or rhonchi. no accessory muscle use, no dullness to percussion  CARD:  RRR, no m/r/g, no peripheral edema, pulses intact, no cyanosis or clubbing.  GI:   Soft & nt; nml bowel sounds; no organomegaly or masses detected.   Musco: Warm bil, no deformities or joint swelling noted.   Neuro: alert, no focal deficits noted.    Skin: Warm, no lesions or rashes    Lab Results:  CBC    Component Value Date/Time   WBC 5.7 07/09/2017 1335   RBC 4.10 07/09/2017 1335   HGB 11.1 (L) 07/09/2017 1335   HCT 35.5 (L) 07/09/2017 1335   PLT  278 07/09/2017 1335   MCV 86.6 07/09/2017 1335   MCH 27.1 07/09/2017 1335   MCHC 31.3 07/09/2017 1335   RDW 13.5 07/09/2017 1335   LYMPHSABS 1.4 07/09/2017 1335   MONOABS 0.5 07/09/2017 1335   EOSABS 0.2 07/09/2017 1335   BASOSABS 0.0 07/09/2017 1335    BMET    Component Value Date/Time   NA 142 07/25/2017 1239   K 4.1 07/25/2017 1239   CL 105 07/25/2017 1239   CO2 22 07/25/2017 1239   GLUCOSE 91 07/25/2017 1239   GLUCOSE 117 (H) 07/09/2017 1335   BUN 13 07/25/2017 1239   CREATININE 0.66 07/25/2017 1239   CALCIUM 9.4 07/25/2017 1239   GFRNONAA 92 07/25/2017 1239   GFRAA 106 07/25/2017 1239    BNP No results found for: BNP  ProBNP No results found for: PROBNP  Imaging: No results found.   Assessment & Plan:   Follow up in 1 year to see Dr. Halford Chessman. Continue CPAP. Call with any concerns or issues with you CPAP.   OSA (obstructive sleep apnea) Follow-up with our office in 1 year Continue your CPAP nightly, as well as with naps When using your CPAP in the [place] remember to bring ST card at your next appointment so that way we can observe compliance to your [facility] report which we get from the CPAP that you use here in [facility] our office if you are having any other difficulty sleeping or issues using your CPAP  CPAP Cleaning:  Clean weekly, with Dawn soap, and bottle brush.  Set up to air dry.  Marland Kitchen Keep up the hard work using your device.  . Do not drive or operate heavy machinery if tired or drowsy.  . Please notify the supply company and office if you are unable to use your device regularly due to missing supplies or machine being broken.  . Work on maintaining a healthy weight and following your recommended nutrition plan  . Maintain proper daily exercise and movement  . Maintaining proper use of your device can also help improve management of other chronic illnesses such as: Blood  pressure, blood sugars, and weight management.       Obesity (BMI  30-39.9) Continue to work towards healthy weight       Lauraine Rinne, NP 08/22/2017

## 2017-08-22 ENCOUNTER — Ambulatory Visit (INDEPENDENT_AMBULATORY_CARE_PROVIDER_SITE_OTHER): Payer: Medicare Other | Admitting: Pulmonary Disease

## 2017-08-22 ENCOUNTER — Ambulatory Visit (INDEPENDENT_AMBULATORY_CARE_PROVIDER_SITE_OTHER): Payer: Medicare Other | Admitting: Family Medicine

## 2017-08-22 ENCOUNTER — Encounter: Payer: Self-pay | Admitting: Pulmonary Disease

## 2017-08-22 VITALS — BP 100/64 | HR 60 | Temp 98.1°F | Ht 63.0 in | Wt 197.0 lb

## 2017-08-22 DIAGNOSIS — E559 Vitamin D deficiency, unspecified: Secondary | ICD-10-CM | POA: Diagnosis not present

## 2017-08-22 DIAGNOSIS — Z6834 Body mass index (BMI) 34.0-34.9, adult: Secondary | ICD-10-CM | POA: Diagnosis not present

## 2017-08-22 DIAGNOSIS — G4733 Obstructive sleep apnea (adult) (pediatric): Secondary | ICD-10-CM | POA: Diagnosis not present

## 2017-08-22 DIAGNOSIS — E669 Obesity, unspecified: Secondary | ICD-10-CM | POA: Diagnosis not present

## 2017-08-22 NOTE — Assessment & Plan Note (Signed)
Continue to work towards healthy weight 

## 2017-08-22 NOTE — Patient Instructions (Addendum)
Follow-up with our office in 1 year Continue your CPAP nightly, as well as with naps When using your CPAP in the [place] remember to bring ST card at your next appointment so that way we can observe compliance to your [facility] report which we get from the CPAP that you use here in [facility] our office if you are having any other difficulty sleeping or issues using your CPAP  CPAP Cleaning:  Clean weekly, with Dawn soap, and bottle brush.  Set up to air dry.  Marland Kitchen Keep up the hard work using your device.  . Do not drive or operate heavy machinery if tired or drowsy.  . Please notify the supply company and office if you are unable to use your device regularly due to missing supplies or machine being broken.  . Work on maintaining a healthy weight and following your recommended nutrition plan  . Maintain proper daily exercise and movement  . Maintaining proper use of your device can also help improve management of other chronic illnesses such as: Blood pressure, blood sugars, and weight management.       Please contact the office if your symptoms worsen or you have concerns that you are not improving.   Thank you for choosing  [facility] for your healthcare, and for allowing [name] to partner with you on your healthcare journey. I am thankful to be able to provide care to you today.   Wyn Quaker FNP-C

## 2017-08-22 NOTE — Progress Notes (Signed)
Reviewed and agree with assessment/plan.   Kenyata Napier, MD [facility] 02/23/2016, 12:24 PM Pager:  [phone]  

## 2017-08-22 NOTE — Assessment & Plan Note (Signed)
Follow-up with our office in 1 year Continue your CPAP nightly, as well as with naps When using your CPAP in the [place] remember to bring ST card at your next appointment so that way we can observe compliance to your [facility] report which we get from the CPAP that you use here in [facility] our office if you are having any other difficulty sleeping or issues using your CPAP  CPAP Cleaning:  Clean weekly, with Dawn soap, and bottle brush.  Set up to air dry.  Marland Kitchen Keep up the hard work using your device.  . Do not drive or operate heavy machinery if tired or drowsy.  . Please notify the supply company and office if you are unable to use your device regularly due to missing supplies or machine being broken.  . Work on maintaining a healthy weight and following your recommended nutrition plan  . Maintain proper daily exercise and movement  . Maintaining proper use of your device can also help improve management of other chronic illnesses such as: Blood pressure, blood sugars, and weight management.

## 2017-08-22 NOTE — Progress Notes (Signed)
Office: [phone]  /  Fax: [phone]   HPI:   Chief Complaint: OBESITY Debra Harris is here to discuss her progress with her obesity treatment plan. She is on the Category 1 plan and is following her eating plan approximately 99.5 % of the time. She states she is biking and doing various exercises for 30 minutes 2 to 3 times per week. Debra Harris is doing well on the category 1 plan. She reports being able to eat all the food on the plan over the last 2 weeks. She denies hunger or cravings. Debra Harris reports eating out more often over the last 2 weeks. She is requesting more variety on her diet plan. Her weight is 197 lb (89.4 kg) today and has had a weight loss of 3 pounds over a period of 2 weeks since her last visit. She has lost 11 lbs since starting treatment with [name].  Vitamin D deficiency Debra Harris has a diagnosis of vitamin D deficiency. She is on vit D but has forgotten to take it over the last 1 to 2 weeks. She denies fatigue.nausea, vomiting or muscle weakness.   ALLERGIES: Allergies  Allergen Reactions  . Injectafer [Ferric Carboxymaltose]     Has tolerated feraheme without problems    MEDICATIONS: Current Outpatient Medications on File Prior to Visit  Medication Sig Dispense Refill  . acetaminophen (TYLENOL) 500 MG tablet Take 500 mg by mouth every 6 (six) hours as needed.    . cetirizine (ZYRTEC) 10 MG tablet Take 10 mg by mouth daily.     Marland Kitchen HYDROcodone-acetaminophen (NORCO) 5-325 MG tablet Take 1 tablet by mouth every 6 (six) hours as needed for moderate pain. 60 tablet 0  . metFORMIN (GLUCOPHAGE) 500 MG tablet Take 1,000 mg by mouth daily with breakfast.     . naproxen sodium (ALEVE) 220 MG tablet Take 220-440 mg by mouth daily as needed.    . Vitamin D, Ergocalciferol, (DRISDOL) 50000 units CAPS capsule Take 1 capsule (50,000 Units total) by mouth every 7 (seven) days. (Patient not taking: Reported on 08/22/2017) 4 capsule 0   No current facility-administered medications on  file prior to visit.     PAST MEDICAL HISTORY: Past Medical History:  Diagnosis Date  . Adenomatous colon polyp   . Allergic rhinitis   . Arthritis   . Back pain   . Constipation   . Diabetes mellitus   . Diverticulosis   . External hemorrhoids   . Fibromyalgia   . Gallbladder problem   . GERD (gastroesophageal reflux disease)   . Glaucoma   . Glaucoma   . Hemorrhoids    grade 3   . Hip pain   . IBS (irritable bowel syndrome)   . IDA (iron deficiency anemia)   . Iron deficiency anemia 06/05/2014  . Irregular heartbeat   . Leg edema   . Leg numbness   . Leg pain   . Low tension glaucoma   . Neuropathy, leg   . Ovarian cyst 11/20/00  . Prediabetes   . Sleep apnea    CPAP  . Sliding hiatal hernia     PAST SURGICAL HISTORY: Past Surgical History:  Procedure Laterality Date  . CARPAL TUNNEL RELEASE Bilateral   . CATARACT EXTRACTION     bilateral  . CHOLECYSTECTOMY    . COLONOSCOPY  2005   Small external hemorrhoids, single sigmoid colon diverticulum, small polyp at the hepatic flexure focally adenomatous.  . COLONOSCOPY  2010   Dr. Gala Romney, Versed 4/Demerol 75. Pancolonic diverticula,  external hemorrhoids. Colonoscopy every 5 years for history of adenomatous polyps  . COLONOSCOPY  2014   Dr. Olevia Perches: Propofol. mild diverticulosis in the sigmoid colon, random colon biopsies benign.  . COLONOSCOPY WITH PROPOFOL N/A 09/13/2015   Procedure: COLONOSCOPY WITH PROPOFOL;  Surgeon: Daneil Dolin, MD;  [facility];  Service: Endoscopy;  Laterality: N/A;  1030  . ESOPHAGEAL DILATION N/A 10/26/2014   Procedure: ESOPHAGEAL DILATION [place];  Surgeon: Daneil Dolin, MD;  Location: [facility];  Service: Endoscopy;  Laterality: N/A;  . ESOPHAGOGASTRODUODENOSCOPY  2010   Dr. Gala Romney, distal esophageal erosions, Venia Minks dilator passed. Comment small hiatal hernia, patulous EG junction.  . ESOPHAGOGASTRODUODENOSCOPY (EGD) WITH PROPOFOL N/A 10/26/2014   Dr.Rourk- distal esophageal  erosions c/w mild erosive reflux esophagitis. hiatal hernia, o/w normal EGD  . ESOPHAGOGASTRODUODENOSCOPY (EGD) WITH PROPOFOL N/A 09/13/2015   Procedure: ESOPHAGOGASTRODUODENOSCOPY (EGD) WITH PROPOFOL;  Surgeon: Daneil Dolin, MD;  [facility];  Service: Endoscopy;  Laterality: N/A;  . GIVENS CAPSULE STUDY N/A 10/26/2014   couple of mucosal breaks/erosions but essentially unremarkable small bowel study  . HEMORRHOID BANDING    . KNEE SURGERY     right-arthroscopy  . OOPHORECTOMY  age 6s  . PARTIAL HYSTERECTOMY  age 39s  . RECTAL EXAM UNDER ANESTHESIA N/A 10/26/2014   circumferential grade 3 hemoorrhoids easily reducible  . thumb surger Bilateral    trigger thumb    SOCIAL HISTORY: Social History   Tobacco Use  . Smoking status: Never Smoker  . Smokeless tobacco: Never Used  . Tobacco comment: Pt reports tried once in 7th grade//ms  Substance Use Topics  . Alcohol use: Yes    Alcohol/week: 0.6 oz    Types: 1 Glasses of wine per week    Comment: rare  . Drug use: No    FAMILY HISTORY: Family History  Problem Relation Age of Onset  . Ulcers Mother   . Glaucoma Mother   . Heart disease Mother   . Diabetes Mother   . Depression Mother   . Sudden death Mother   . Obesity Mother   . Glaucoma Maternal Grandmother   . Heart disease Maternal Grandmother   . Heart disease Maternal Grandfather   . Colon cancer Neg Hx     ROS: Review of Systems  Constitutional: Positive for weight loss. Negative for malaise/fatigue.  Gastrointestinal: Negative for nausea and vomiting.  Musculoskeletal:       Negative for muscle weakness    PHYSICAL EXAM: Blood pressure 100/64, pulse 60, temperature 98.1 F (36.7 C), height 5\' 3"  (1.6 m), weight 197 lb (89.4 kg), SpO2 96 %. Body mass index is 34.9 kg/m. Physical Exam  Constitutional: She is oriented to person, place, and time. She appears well-developed and well-nourished.  Cardiovascular: Normal rate.  Pulmonary/Chest:  Effort normal.  Musculoskeletal: Normal range of motion.  Neurological: She is oriented to person, place, and time.  Skin: Skin is warm and dry.  Psychiatric: She has a normal mood and affect. Her behavior is normal.  Vitals reviewed.   RECENT LABS AND TESTS: BMET    Component Value Date/Time   NA 142 07/25/2017 1239   K 4.1 07/25/2017 1239   CL 105 07/25/2017 1239   CO2 22 07/25/2017 1239   GLUCOSE 91 07/25/2017 1239   GLUCOSE 117 (H) 07/09/2017 1335   BUN 13 07/25/2017 1239   CREATININE 0.66 07/25/2017 1239   CALCIUM 9.4 07/25/2017 1239   GFRNONAA 92 07/25/2017 1239  GFRAA 106 07/25/2017 1239   Lab Results  Component Value Date   HGBA1C 5.5 07/09/2017   Lab Results  Component Value Date   INSULIN 21.9 07/25/2017   CBC    Component Value Date/Time   WBC 5.7 07/09/2017 1335   RBC 4.10 07/09/2017 1335   HGB 11.1 (L) 07/09/2017 1335   HCT 35.5 (L) 07/09/2017 1335   PLT 278 07/09/2017 1335   MCV 86.6 07/09/2017 1335   MCH 27.1 07/09/2017 1335   MCHC 31.3 07/09/2017 1335   RDW 13.5 07/09/2017 1335   LYMPHSABS 1.4 07/09/2017 1335   MONOABS 0.5 07/09/2017 1335   EOSABS 0.2 07/09/2017 1335   BASOSABS 0.0 07/09/2017 1335   Iron/TIBC/Ferritin/ %Sat    Component Value Date/Time   IRON 33 07/09/2017 1335   TIBC 458 (H) 07/09/2017 1335   FERRITIN 11 07/09/2017 1335   IRONPCTSAT 7 (L) 07/09/2017 1335   Lipid Panel     Component Value Date/Time   CHOL 172 07/25/2017 1246   TRIG 144 07/25/2017 1246   HDL 35 (L) 07/25/2017 1246   LDLCALC 108 (H) 07/25/2017 1246   Hepatic Function Panel     Component Value Date/Time   PROT 7.0 07/25/2017 1239   ALBUMIN 4.5 07/25/2017 1239   AST 20 07/25/2017 1239   ALT 24 07/25/2017 1239   ALKPHOS 62 07/25/2017 1239   BILITOT 0.5 07/25/2017 1239      Component Value Date/Time   TSH 0.718 07/25/2017 1239   TSH 0.71 10/22/2012 1030   Results for MYKEL, MOHL D "SANDY" (MRN [id number]) as of 08/22/2017 17:06  Ref. Range  07/25/2017 12:39  Vitamin D, 25-Hydroxy Latest Ref Range: 30.0 - 100.0 ng/mL 24.4 (L)   ASSESSMENT AND PLAN: Vitamin D deficiency  Class 1 obesity with serious comorbidity and body mass index (BMI) of 34.0 to 34.9 in adult, unspecified obesity type  PLAN:  Vitamin D Deficiency Debra Harris was informed that low vitamin D levels contributes to fatigue and are associated with obesity, breast, and colon cancer. She agrees to continue to take prescription Vit D @50 ,000 IU every week and will follow up for routine testing of vitamin D, at least 2-3 times per year. She was informed of the risk of over-replacement of vitamin D and agrees to not increase her dose unless she discusses this with [name] first. We will recheck labs in 2 months and Debra Harris will follow up as directed.  We spent > than 50% of the 15 minute visit on the counseling as documented in the note.  Obesity Trinady is currently in the action stage of change. As such, her goal is to continue with weight loss efforts She has agreed to continue with the Category 1 plan with added breakfast options. She was given additional fruit options. Debra Harris has been instructed to work up to a goal of 150 minutes of combined cardio and strengthening exercise per week for weight loss and overall health benefits. We discussed the following Behavioral Modification Strategies today: decrease eating out and better snacking choices  Debra Harris has agreed to follow up with our clinic in 2 to 3 weeks. She was informed of the importance of frequent follow up visits to maximize her success with intensive lifestyle modifications for her multiple health conditions.   OBESITY BEHAVIORAL INTERVENTION VISIT  Today's visit was # 3 out of 22.  Starting weight: 208 lbs Starting date: 07/25/17 Today's weight : 197 lbs  Today's date: 08/22/2017 Total lbs lost to date: 42 (Patients must lose  7 lbs in the first 6 months to continue with counseling)   ASK: We discussed the  diagnosis of obesity with Debra Harris today and Debra Harris agreed to give [name] permission to discuss obesity behavioral modification therapy today.  ASSESS: Jahniyah has the diagnosis of obesity and her BMI today is 34.91 [place] is in the action stage of change   ADVISE: Debra Harris was educated on the multiple health risks of obesity as well as the benefit of weight loss to improve her health. She was advised of the need for long term treatment and the importance of lifestyle modifications.  AGREE: Multiple dietary modification options and treatment options were discussed and  Debra Harris agreed to the above obesity treatment plan.  I, Doreene Nest, am acting as transcriptionist for Dennard Nip, MD  I have reviewed the above documentation for accuracy and completeness, and I agree with the above. -Dennard Nip, MD

## 2017-08-29 ENCOUNTER — Encounter: Payer: Self-pay | Admitting: Internal Medicine

## 2017-09-05 DIAGNOSIS — D3131 Benign neoplasm of right choroid: Secondary | ICD-10-CM | POA: Diagnosis not present

## 2017-09-05 DIAGNOSIS — H401132 Primary open-angle glaucoma, bilateral, moderate stage: Secondary | ICD-10-CM | POA: Diagnosis not present

## 2017-09-05 DIAGNOSIS — Z961 Presence of intraocular lens: Secondary | ICD-10-CM | POA: Diagnosis not present

## 2017-09-05 DIAGNOSIS — H43813 Vitreous degeneration, bilateral: Secondary | ICD-10-CM | POA: Diagnosis not present

## 2017-09-05 DIAGNOSIS — H04123 Dry eye syndrome of bilateral lacrimal glands: Secondary | ICD-10-CM | POA: Diagnosis not present

## 2017-09-13 ENCOUNTER — Ambulatory Visit (INDEPENDENT_AMBULATORY_CARE_PROVIDER_SITE_OTHER): Payer: Medicare Other | Admitting: Family Medicine

## 2017-09-13 VITALS — BP 95/48 | HR 60 | Temp 98.1°F | Ht 63.0 in | Wt 189.0 lb

## 2017-09-13 DIAGNOSIS — R7303 Prediabetes: Secondary | ICD-10-CM

## 2017-09-13 DIAGNOSIS — Z6833 Body mass index (BMI) 33.0-33.9, adult: Secondary | ICD-10-CM | POA: Diagnosis not present

## 2017-09-13 DIAGNOSIS — E669 Obesity, unspecified: Secondary | ICD-10-CM

## 2017-09-13 MED ORDER — METFORMIN HCL 500 MG PO TABS: 1000.0000 mg | ORAL_TABLET | Freq: Every day | ORAL | 0 refills | Status: DC

## 2017-09-13 NOTE — Progress Notes (Signed)
Office: [phone]  /  Fax: [phone]   HPI:   Chief Complaint: OBESITY Debra Harris is here to discuss her progress with her obesity treatment plan. She is on the Category 1 plan + breakfast options and is following her eating plan approximately 95-98 % of the time. She states she is doing yard work for 45 minutes 5 times per week. Debra Harris continues to do well with weight loss on Category 1 plan. She is feeling better and more active in the yard. She is going to be doing some celebration eating for her husband's birthday.  Her weight is 189 lb (85.7 kg) today and has had a weight loss of 8 pounds over a period of 3 weeks since her last visit. She has lost 19 lbs since starting treatment with [name].  Pre-Diabetes Debra Harris has a diagnosis of pre-diabetes based on her elevated Hgb A1c and was informed this puts her at greater risk of developing diabetes. She is stable on metformin and she denies nausea, vomiting, or hypoglycemia. She is doing well on diet prescription and continues to work on exercise to decrease risk of diabetes.   ALLERGIES: Allergies  Allergen Reactions  . Injectafer [Ferric Carboxymaltose]     Has tolerated feraheme without problems    MEDICATIONS: Current Outpatient Medications on File Prior to Visit  Medication Sig Dispense Refill  . acetaminophen (TYLENOL) 500 MG tablet Take 500 mg by mouth every 6 (six) hours as needed.    . cetirizine (ZYRTEC) 10 MG tablet Take 10 mg by mouth daily.     Marland Kitchen HYDROcodone-acetaminophen (NORCO) 5-325 MG tablet Take 1 tablet by mouth every 6 (six) hours as needed for moderate pain. 60 tablet 0  . naproxen sodium (ALEVE) 220 MG tablet Take 220-440 mg by mouth daily as needed.    . Vitamin D, Ergocalciferol, (DRISDOL) 50000 units CAPS capsule Take 1 capsule (50,000 Units total) by mouth every 7 (seven) days. 4 capsule 0   No current facility-administered medications on file prior to visit.     PAST MEDICAL HISTORY: Past Medical History:    Diagnosis Date  . Adenomatous colon polyp   . Allergic rhinitis   . Arthritis   . Back pain   . Constipation   . Diabetes mellitus   . Diverticulosis   . External hemorrhoids   . Fibromyalgia   . Gallbladder problem   . GERD (gastroesophageal reflux disease)   . Glaucoma   . Glaucoma   . Hemorrhoids    grade 3   . Hip pain   . IBS (irritable bowel syndrome)   . IDA (iron deficiency anemia)   . Iron deficiency anemia 06/05/2014  . Irregular heartbeat   . Leg edema   . Leg numbness   . Leg pain   . Low tension glaucoma   . Neuropathy, leg   . Ovarian cyst 11/20/00  . Prediabetes   . Sleep apnea    CPAP  . Sliding hiatal hernia     PAST SURGICAL HISTORY: Past Surgical History:  Procedure Laterality Date  . CARPAL TUNNEL RELEASE Bilateral   . CATARACT EXTRACTION     bilateral  . CHOLECYSTECTOMY    . COLONOSCOPY  2005   Small external hemorrhoids, single sigmoid colon diverticulum, small polyp at the hepatic flexure focally adenomatous.  . COLONOSCOPY  2010   Dr. Gala Romney, Versed 4/Demerol 75. Pancolonic diverticula, external hemorrhoids. Colonoscopy every 5 years for history of adenomatous polyps  . COLONOSCOPY  2014   Dr. Olevia Perches:  Propofol. mild diverticulosis in the sigmoid colon, random colon biopsies benign.  . COLONOSCOPY WITH PROPOFOL N/A 09/13/2015   Procedure: COLONOSCOPY WITH PROPOFOL;  Surgeon: Daneil Dolin, MD;  [facility];  Service: Endoscopy;  Laterality: N/A;  1030  . ESOPHAGEAL DILATION N/A 10/26/2014   Procedure: ESOPHAGEAL DILATION [facility];  Surgeon: Daneil Dolin, MD;  Location: [facility];  Service: Endoscopy;  Laterality: N/A;  . ESOPHAGOGASTRODUODENOSCOPY  2010   Dr. Gala Romney, distal esophageal erosions, Venia Minks dilator passed. Comment small hiatal hernia, patulous EG junction.  . ESOPHAGOGASTRODUODENOSCOPY (EGD) WITH PROPOFOL N/A 10/26/2014   Dr.Rourk- distal esophageal erosions c/w mild erosive reflux esophagitis. hiatal hernia, o/w normal  EGD  . ESOPHAGOGASTRODUODENOSCOPY (EGD) WITH PROPOFOL N/A 09/13/2015   Procedure: ESOPHAGOGASTRODUODENOSCOPY (EGD) WITH PROPOFOL;  Surgeon: Daneil Dolin, MD;  [facility];  Service: Endoscopy;  Laterality: N/A;  . GIVENS CAPSULE STUDY N/A 10/26/2014   couple of mucosal breaks/erosions but essentially unremarkable small bowel study  . HEMORRHOID BANDING    . KNEE SURGERY     right-arthroscopy  . OOPHORECTOMY  age 60s  . PARTIAL HYSTERECTOMY  age 82s  . RECTAL EXAM UNDER ANESTHESIA N/A 10/26/2014   circumferential grade 3 hemoorrhoids easily reducible  . thumb surger Bilateral    trigger thumb    SOCIAL HISTORY: Social History   Tobacco Use  . Smoking status: Never Smoker  . Smokeless tobacco: Never Used  . Tobacco comment: Pt reports tried once in 7th grade//ms  Substance Use Topics  . Alcohol use: Yes    Alcohol/week: 0.6 oz    Types: 1 Glasses of wine per week    Comment: rare  . Drug use: No    FAMILY HISTORY: Family History  Problem Relation Age of Onset  . Ulcers Mother   . Glaucoma Mother   . Heart disease Mother   . Diabetes Mother   . Depression Mother   . Sudden death Mother   . Obesity Mother   . Glaucoma Maternal Grandmother   . Heart disease Maternal Grandmother   . Heart disease Maternal Grandfather   . Colon cancer Neg Hx     ROS: Review of Systems  Constitutional: Positive for weight loss.  Gastrointestinal: Negative for nausea and vomiting.  Endo/Heme/Allergies:       Negative hypoglycemia    PHYSICAL EXAM: Blood pressure (!) 95/48, pulse 60, temperature 98.1 F (36.7 C), temperature source Oral, height 5\' 3"  (1.6 m), weight 189 lb (85.7 kg), SpO2 97 %. Body mass index is 33.48 kg/m. Physical Exam  Constitutional: She is oriented to person, place, and time. She appears well-developed and well-nourished.  Cardiovascular: Normal rate.  Pulmonary/Chest: Effort normal.  Musculoskeletal: Normal range of motion.  Neurological: She  is oriented to person, place, and time.  Skin: Skin is warm and dry.  Psychiatric: She has a normal mood and affect. Her behavior is normal.  Vitals reviewed.   RECENT LABS AND TESTS: BMET    Component Value Date/Time   NA 142 07/25/2017 1239   K 4.1 07/25/2017 1239   CL 105 07/25/2017 1239   CO2 22 07/25/2017 1239   GLUCOSE 91 07/25/2017 1239   GLUCOSE 117 (H) 07/09/2017 1335   BUN 13 07/25/2017 1239   CREATININE 0.66 07/25/2017 1239   CALCIUM 9.4 07/25/2017 1239   GFRNONAA 92 07/25/2017 1239   GFRAA 106 07/25/2017 1239   Lab Results  Component Value Date   HGBA1C 5.5 07/09/2017   Lab Results  Component Value Date   INSULIN 21.9 07/25/2017   CBC    Component Value Date/Time   WBC 5.7 07/09/2017 1335   RBC 4.10 07/09/2017 1335   HGB 11.1 (L) 07/09/2017 1335   HCT 35.5 (L) 07/09/2017 1335   PLT 278 07/09/2017 1335   MCV 86.6 07/09/2017 1335   MCH 27.1 07/09/2017 1335   MCHC 31.3 07/09/2017 1335   RDW 13.5 07/09/2017 1335   LYMPHSABS 1.4 07/09/2017 1335   MONOABS 0.5 07/09/2017 1335   EOSABS 0.2 07/09/2017 1335   BASOSABS 0.0 07/09/2017 1335   Iron/TIBC/Ferritin/ %Sat    Component Value Date/Time   IRON 33 07/09/2017 1335   TIBC 458 (H) 07/09/2017 1335   FERRITIN 11 07/09/2017 1335   IRONPCTSAT 7 (L) 07/09/2017 1335   Lipid Panel     Component Value Date/Time   CHOL 172 07/25/2017 1246   TRIG 144 07/25/2017 1246   HDL 35 (L) 07/25/2017 1246   LDLCALC 108 (H) 07/25/2017 1246   Hepatic Function Panel     Component Value Date/Time   PROT 7.0 07/25/2017 1239   ALBUMIN 4.5 07/25/2017 1239   AST 20 07/25/2017 1239   ALT 24 07/25/2017 1239   ALKPHOS 62 07/25/2017 1239   BILITOT 0.5 07/25/2017 1239      Component Value Date/Time   TSH 0.718 07/25/2017 1239   TSH 0.71 10/22/2012 1030    ASSESSMENT AND PLAN: Prediabetes - Plan: metFORMIN (GLUCOPHAGE) 500 MG tablet  Class 1 obesity with serious comorbidity and body mass index (BMI) of 33.0 to 33.9  in adult, unspecified obesity type  PLAN:  Pre-Diabetes Debra Harris will continue to work on weight loss, exercise, and decreasing simple carbohydrates in her diet to help decrease the risk of diabetes. We dicussed metformin including benefits and risks. She was informed that eating too many simple carbohydrates or too many calories at one sitting increases the likelihood of GI side effects. Debra Harris agrees to continue taking metformin 500 mg 2 tablets q AM #60 and we will refill for 1 month. Debra Harris agrees to follow up with our clinic in 2 to 3 weeks as directed to monitor her progress.  Obesity Debra Harris is currently in the action stage of change. As such, her goal is to continue with weight loss efforts She has agreed to follow the Category 1 plan + breakfast options Debra Harris has been instructed to work up to a goal of 150 minutes of combined cardio and strengthening exercise per week for weight loss and overall health benefits. We discussed the following Behavioral Modification Strategies today: celebration eating strategies   Debra Harris has agreed to follow up with our clinic in 2 to 3 weeks. She was informed of the importance of frequent follow up visits to maximize her success with intensive lifestyle modifications for her multiple health conditions.   OBESITY BEHAVIORAL INTERVENTION VISIT  Today's visit was # 4 out of 22.  Starting weight: 208 lbs Starting date: 07/25/17 Today's weight : 189 lbs Today's date: 09/13/2017 Total lbs lost to date: 9    ASK: We discussed the diagnosis of obesity with Debra Harris today and Debra Harris agreed to give [name] permission to discuss obesity behavioral modification therapy today.  ASSESS: Debra Harris has the diagnosis of obesity and her BMI today is 33.49 Debra Harris is in the action stage of change   ADVISE: Debra Harris was educated on the multiple health risks of obesity as well as the benefit of weight loss to improve her health. She was advised of  the need for long  term treatment and the importance of lifestyle modifications.  AGREE: Multiple dietary modification options and treatment options were discussed and  Debra Harris agreed to the above obesity treatment plan.  I, Trixie Dredge, am acting as transcriptionist for Dennard Nip, MD  I have reviewed the above documentation for accuracy and completeness, and I agree with the above. -Dennard Nip, MD

## 2017-10-01 ENCOUNTER — Ambulatory Visit (INDEPENDENT_AMBULATORY_CARE_PROVIDER_SITE_OTHER): Payer: Medicare Other | Admitting: Family Medicine

## 2017-10-01 VITALS — BP 123/62 | HR 66 | Temp 98.3°F | Ht 63.0 in | Wt 186.0 lb

## 2017-10-01 DIAGNOSIS — K5909 Other constipation: Secondary | ICD-10-CM

## 2017-10-01 DIAGNOSIS — E669 Obesity, unspecified: Secondary | ICD-10-CM

## 2017-10-01 DIAGNOSIS — E559 Vitamin D deficiency, unspecified: Secondary | ICD-10-CM | POA: Diagnosis not present

## 2017-10-01 DIAGNOSIS — Z6833 Body mass index (BMI) 33.0-33.9, adult: Secondary | ICD-10-CM | POA: Diagnosis not present

## 2017-10-02 MED ORDER — POLYETHYLENE GLYCOL 3350 17 GM/SCOOP PO POWD: 17.0000 g | Freq: Every day | ORAL | 0 refills | Status: DC

## 2017-10-02 MED ORDER — VITAMIN D (ERGOCALCIFEROL) 1.25 MG (50000 UNIT) PO CAPS: 50000.0000 [IU] | ORAL_CAPSULE | ORAL | 0 refills | Status: DC

## 2017-10-02 NOTE — Progress Notes (Signed)
Office: [phone]  /  Fax: [phone]   HPI:   Chief Complaint: OBESITY Debra Harris is here to discuss her progress with her obesity treatment plan. She is on the Category 1 plan plus breakfast options and is following her eating plan approximately 93 % of the time. She states she is doing stretches for her lower back 20 minutes 3 times per week. Debra Harris continues to do well with weight loss, even while caring for her grandson. Hunger is controlled, but she is getting bored with her plan. Her weight is 186 lb (84.4 kg) today and has had a weight loss of 3 pounds over a period of 2 weeks since her last visit. She has lost 22 lbs since starting treatment with [name].  Constipation  Wallis notes increased constipation for the last 2 to 3 weeks, worse since attempting weight loss. She states BM are less frequent and are not hard and painful. She denies abdominal pain or rectal bleeding.   Vitamin D deficiency Debra Harris has a diagnosis of vitamin D deficiency. She is stable on vit D, but is not yet at goal. Debra Harris denies nausea, vomiting or muscle weakness.  ALLERGIES: Allergies  Allergen Reactions  . Injectafer [Ferric Carboxymaltose]     Has tolerated feraheme without problems    MEDICATIONS: Current Outpatient Medications on File Prior to Visit  Medication Sig Dispense Refill  . acetaminophen (TYLENOL) 500 MG tablet Take 500 mg by mouth every 6 (six) hours as needed.    . cetirizine (ZYRTEC) 10 MG tablet Take 10 mg by mouth daily.     Marland Kitchen HYDROcodone-acetaminophen (NORCO) 5-325 MG tablet Take 1 tablet by mouth every 6 (six) hours as needed for moderate pain. 60 tablet 0  . metFORMIN (GLUCOPHAGE) 500 MG tablet Take 2 tablets (1,000 mg total) by mouth daily with breakfast. 30 tablet 0  . naproxen sodium (ALEVE) 220 MG tablet Take 220-440 mg by mouth daily as needed.    . Vitamin D, Ergocalciferol, (DRISDOL) 50000 units CAPS capsule Take 1 capsule (50,000 Units total) by mouth every 7 (seven)  days. 4 capsule 0   No current facility-administered medications on file prior to visit.     PAST MEDICAL HISTORY: Past Medical History:  Diagnosis Date  . Adenomatous colon polyp   . Allergic rhinitis   . Arthritis   . Back pain   . Constipation   . Diabetes mellitus   . Diverticulosis   . External hemorrhoids   . Fibromyalgia   . Gallbladder problem   . GERD (gastroesophageal reflux disease)   . Glaucoma   . Glaucoma   . Hemorrhoids    grade 3   . Hip pain   . IBS (irritable bowel syndrome)   . IDA (iron deficiency anemia)   . Iron deficiency anemia 06/05/2014  . Irregular heartbeat   . Leg edema   . Leg numbness   . Leg pain   . Low tension glaucoma   . Neuropathy, leg   . Ovarian cyst 11/20/00  . Prediabetes   . Sleep apnea    CPAP  . Sliding hiatal hernia     PAST SURGICAL HISTORY: Past Surgical History:  Procedure Laterality Date  . CARPAL TUNNEL RELEASE Bilateral   . CATARACT EXTRACTION     bilateral  . CHOLECYSTECTOMY    . COLONOSCOPY  2005   Small external hemorrhoids, single sigmoid colon diverticulum, small polyp at the hepatic flexure focally adenomatous.  . COLONOSCOPY  2010   Dr. Gala Romney, Versed  4/Demerol 75. Pancolonic diverticula, external hemorrhoids. Colonoscopy every 5 years for history of adenomatous polyps  . COLONOSCOPY  2014   Dr. Olevia Perches: Propofol. mild diverticulosis in the sigmoid colon, random colon biopsies benign.  . COLONOSCOPY WITH PROPOFOL N/A 09/13/2015   Procedure: COLONOSCOPY WITH PROPOFOL;  Surgeon: Daneil Dolin, MD;  Location: [facility];  Service: Endoscopy;  Laterality: N/A;  1030  . ESOPHAGEAL DILATION N/A 10/26/2014   Procedure: ESOPHAGEAL DILATION [place];  Surgeon: Daneil Dolin, MD;  Location: [facility];  Service: Endoscopy;  Laterality: N/A;  . ESOPHAGOGASTRODUODENOSCOPY  2010   Dr. Gala Romney, distal esophageal erosions, Venia Minks dilator passed. Comment small hiatal hernia, patulous EG junction.  .  ESOPHAGOGASTRODUODENOSCOPY (EGD) WITH PROPOFOL N/A 10/26/2014   Dr.Rourk- distal esophageal erosions c/w mild erosive reflux esophagitis. hiatal hernia, o/w normal EGD  . ESOPHAGOGASTRODUODENOSCOPY (EGD) WITH PROPOFOL N/A 09/13/2015   Procedure: ESOPHAGOGASTRODUODENOSCOPY (EGD) WITH PROPOFOL;  Surgeon: Daneil Dolin, MD;  Location: [facility];  Service: Endoscopy;  Laterality: N/A;  . GIVENS CAPSULE STUDY N/A 10/26/2014   couple of mucosal breaks/erosions but essentially unremarkable small bowel study  . HEMORRHOID BANDING    . KNEE SURGERY     right-arthroscopy  . OOPHORECTOMY  age 52s  . PARTIAL HYSTERECTOMY  age 41s  . RECTAL EXAM UNDER ANESTHESIA N/A 10/26/2014   circumferential grade 3 hemoorrhoids easily reducible  . thumb surger Bilateral    trigger thumb    SOCIAL HISTORY: Social History   Tobacco Use  . Smoking status: Never Smoker  . Smokeless tobacco: Never Used  . Tobacco comment: Pt reports tried once in 7th grade//ms  Substance Use Topics  . Alcohol use: Yes    Alcohol/week: 0.6 oz    Types: 1 Glasses of wine per week    Comment: rare  . Drug use: No    FAMILY HISTORY: Family History  Problem Relation Age of Onset  . Ulcers Mother   . Glaucoma Mother   . Heart disease Mother   . Diabetes Mother   . Depression Mother   . Sudden death Mother   . Obesity Mother   . Glaucoma Maternal Grandmother   . Heart disease Maternal Grandmother   . Heart disease Maternal Grandfather   . Colon cancer Neg Hx     ROS: Review of Systems  Constitutional: Positive for weight loss.  Gastrointestinal: Negative for abdominal pain, nausea and vomiting.       Negative for rectal bleeding  Musculoskeletal:       Negative for muscle weakness    PHYSICAL EXAM: Blood pressure 123/62, pulse 66, temperature 98.3 F (36.8 C), temperature source Oral, height 5\' 3"  (1.6 m), weight 186 lb (84.4 kg), SpO2 95 %. Body mass index is 32.95 kg/m. Physical Exam  Constitutional: She  is oriented to person, place, and time. She appears well-developed and well-nourished.  Cardiovascular: Normal rate.  Pulmonary/Chest: Effort normal.  Musculoskeletal: Normal range of motion.  Neurological: She is oriented to person, place, and time.  Skin: Skin is warm and dry.  Psychiatric: She has a normal mood and affect. Her behavior is normal.  Vitals reviewed.   RECENT LABS AND TESTS: BMET    Component Value Date/Time   NA 142 07/25/2017 1239   K 4.1 07/25/2017 1239   CL 105 07/25/2017 1239   CO2 22 07/25/2017 1239   GLUCOSE 91 07/25/2017 1239   GLUCOSE 117 (H) 07/09/2017 1335   BUN 13 07/25/2017 1239   CREATININE 0.66  07/25/2017 1239   CALCIUM 9.4 07/25/2017 1239   GFRNONAA 92 07/25/2017 1239   GFRAA 106 07/25/2017 1239   Lab Results  Component Value Date   HGBA1C 5.5 07/09/2017   Lab Results  Component Value Date   INSULIN 21.9 07/25/2017   CBC    Component Value Date/Time   WBC 5.7 07/09/2017 1335   RBC 4.10 07/09/2017 1335   HGB 11.1 (L) 07/09/2017 1335   HCT 35.5 (L) 07/09/2017 1335   PLT 278 07/09/2017 1335   MCV 86.6 07/09/2017 1335   MCH 27.1 07/09/2017 1335   MCHC 31.3 07/09/2017 1335   RDW 13.5 07/09/2017 1335   LYMPHSABS 1.4 07/09/2017 1335   MONOABS 0.5 07/09/2017 1335   EOSABS 0.2 07/09/2017 1335   BASOSABS 0.0 07/09/2017 1335   Iron/TIBC/Ferritin/ %Sat    Component Value Date/Time   IRON 33 07/09/2017 1335   TIBC 458 (H) 07/09/2017 1335   FERRITIN 11 07/09/2017 1335   IRONPCTSAT 7 (L) 07/09/2017 1335   Lipid Panel     Component Value Date/Time   CHOL 172 07/25/2017 1246   TRIG 144 07/25/2017 1246   HDL 35 (L) 07/25/2017 1246   LDLCALC 108 (H) 07/25/2017 1246   Hepatic Function Panel     Component Value Date/Time   PROT 7.0 07/25/2017 1239   ALBUMIN 4.5 07/25/2017 1239   AST 20 07/25/2017 1239   ALT 24 07/25/2017 1239   ALKPHOS 62 07/25/2017 1239   BILITOT 0.5 07/25/2017 1239      Component Value Date/Time   TSH 0.718  07/25/2017 1239   TSH 0.71 10/22/2012 1030   Results for BETTYJANE, SHENOY D "SANDY" (MRN [id number]) as of 10/02/2017 11:03  Ref. Range 07/25/2017 12:39  Vitamin D, 25-Hydroxy Latest Ref Range: 30.0 - 100.0 ng/mL 24.4 (L)   ASSESSMENT AND PLAN: Other constipation - Plan: polyethylene glycol powder (GLYCOLAX/MIRALAX) powder  Vitamin D deficiency - Plan: Vitamin D, Ergocalciferol, (DRISDOL) 50000 units CAPS capsule  Class 1 obesity with serious comorbidity and body mass index (BMI) of 33.0 to 33.9 in adult, unspecified obesity type  PLAN:  Constipation  Maisie was informed decrease bowel movement frequency is normal while losing weight, but stools should not be hard or painful. She was advised to increase her H20 intake and work on increasing her fiber intake. High fiber foods were discussed today. Trezure agrees to start Miralax 17 grams daily, prescription was written today for 1 month. Kareema will follow up as directed.  Vitamin D Deficiency Sreya was informed that low vitamin D levels contributes to fatigue and are associated with obesity, breast, and colon cancer. She agrees to continue to take prescription Vit D @50 ,000 IU every week #4 with no refills and will follow up for routine testing of vitamin D, at least 2-3 times per year. She was informed of the risk of over-replacement of vitamin D and agrees to not increase her dose unless she discusses this with [name] first. Nichole agrees to follow up as directed.  Obesity Mattalynn is currently in the action stage of change. As such, her goal is to continue with weight loss efforts She has agreed to keep a food journal with 800 to 1000 calories and 70+ grams of protein daily Jenniah has been instructed to work up to a goal of 150 minutes of combined cardio and strengthening exercise per week for weight loss and overall health benefits. We discussed the following Behavioral Modification Strategies today: increase H2O intake, increasing lean protein  intake and  decreasing simple carbohydrates   Viva has agreed to follow up with our clinic in 3 weeks. She was informed of the importance of frequent follow up visits to maximize her success with intensive lifestyle modifications for her multiple health conditions.   OBESITY BEHAVIORAL INTERVENTION VISIT  Today's visit was # 5 out of 22.  Starting weight: 208 lbs Starting date: 07/25/17 Today's weight : 186 lbs  Today's date: 10/01/2017 Total lbs lost to date: 79    ASK: We discussed the diagnosis of obesity with Garnette Gunner today and Katharine Look agreed to give [name] permission to discuss obesity behavioral modification therapy today.  ASSESS: Kalinda has the diagnosis of obesity and her BMI today is 32.96 Keneisha is in the action stage of change   ADVISE: Neeta was educated on the multiple health risks of obesity as well as the benefit of weight loss to improve her health. She was advised of the need for long term treatment and the importance of lifestyle modifications.  AGREE: Multiple dietary modification options and treatment options were discussed and  Benigna agreed to the above obesity treatment plan.  I, Doreene Nest, am acting as transcriptionist for Dennard Nip, MD  I have reviewed the above documentation for accuracy and completeness, and I agree with the above. -Dennard Nip, MD

## 2017-10-08 ENCOUNTER — Inpatient Hospital Stay (HOSPITAL_COMMUNITY): Payer: Medicare Other | Attending: Hematology

## 2017-10-08 DIAGNOSIS — E669 Obesity, unspecified: Secondary | ICD-10-CM | POA: Insufficient documentation

## 2017-10-08 DIAGNOSIS — D500 Iron deficiency anemia secondary to blood loss (chronic): Secondary | ICD-10-CM | POA: Insufficient documentation

## 2017-10-08 DIAGNOSIS — E559 Vitamin D deficiency, unspecified: Secondary | ICD-10-CM | POA: Diagnosis not present

## 2017-10-08 DIAGNOSIS — E119 Type 2 diabetes mellitus without complications: Secondary | ICD-10-CM | POA: Diagnosis not present

## 2017-10-08 DIAGNOSIS — K5900 Constipation, unspecified: Secondary | ICD-10-CM | POA: Insufficient documentation

## 2017-10-08 DIAGNOSIS — K5730 Diverticulosis of large intestine without perforation or abscess without bleeding: Secondary | ICD-10-CM | POA: Insufficient documentation

## 2017-10-08 DIAGNOSIS — D509 Iron deficiency anemia, unspecified: Secondary | ICD-10-CM

## 2017-10-08 LAB — COMPREHENSIVE METABOLIC PANEL
ALBUMIN: 4 g/dL (ref 3.5–5.0)
ALT: 34 U/L (ref 0–44)
AST: 25 U/L (ref 15–41)
Alkaline Phosphatase: 69 U/L (ref 38–126)
Anion gap: 8 (ref 5–15)
BILIRUBIN TOTAL: 1.2 mg/dL (ref 0.3–1.2)
BUN: 16 mg/dL (ref 8–23)
CO2: 28 mmol/L (ref 22–32)
Calcium: 9.5 mg/dL (ref 8.9–10.3)
Chloride: 105 mmol/L (ref 98–111)
Creatinine, Ser: 0.65 mg/dL (ref 0.44–1.00)
GFR calc Af Amer: >60 mL/min (ref >60)
GFR calc non Af Amer: >60 mL/min (ref >60)
GLUCOSE: 129 mg/dL — AB (ref 70–99)
POTASSIUM: 3.7 mmol/L (ref 3.5–5.1)
Sodium: 141 mmol/L (ref 135–145)
TOTAL PROTEIN: 7.4 g/dL (ref 6.5–8.1)

## 2017-10-08 LAB — CBC WITH DIFFERENTIAL/PLATELET
BASOS ABS: 0 10*3/uL (ref 0.0–0.1)
BASOS PCT: 0 %
Eosinophils Absolute: 0.1 10*3/uL (ref 0.0–0.7)
Eosinophils Relative: 3 %
HEMATOCRIT: 41.8 % (ref 36.0–46.0)
HEMOGLOBIN: 13.6 g/dL (ref 12.0–15.0)
Lymphocytes Relative: 22 %
Lymphs Abs: 1.1 10*3/uL (ref 0.7–4.0)
MCH: 27.7 pg (ref 26.0–34.0)
MCHC: 32.5 g/dL (ref 30.0–36.0)
MCV: 85.1 fL (ref 78.0–100.0)
MONO ABS: 0.4 10*3/uL (ref 0.1–1.0)
Monocytes Relative: 8 %
NEUTROS ABS: 3.2 10*3/uL (ref 1.7–7.7)
NEUTROS PCT: 67 %
Platelets: 190 10*3/uL (ref 150–400)
RBC: 4.91 MIL/uL (ref 3.87–5.11)
RDW: 15.7 % — AB (ref 11.5–15.5)
WBC: 4.8 10*3/uL (ref 4.0–10.5)

## 2017-10-08 LAB — IRON AND TIBC
Iron: 76 ug/dL (ref 28–170)
SATURATION RATIOS: 20 % (ref 10.4–31.8)
TIBC: 389 ug/dL (ref 250–450)
UIBC: 313 ug/dL

## 2017-10-08 LAB — FERRITIN: Ferritin: 111 ng/mL (ref 11–307)

## 2017-10-11 ENCOUNTER — Other Ambulatory Visit (HOSPITAL_COMMUNITY): Payer: Self-pay | Admitting: Internal Medicine

## 2017-10-11 ENCOUNTER — Inpatient Hospital Stay (HOSPITAL_BASED_OUTPATIENT_CLINIC_OR_DEPARTMENT_OTHER): Payer: Medicare Other | Admitting: Internal Medicine

## 2017-10-11 ENCOUNTER — Encounter (HOSPITAL_COMMUNITY): Payer: Self-pay | Admitting: Internal Medicine

## 2017-10-11 VITALS — BP 139/62 | HR 71 | Temp 98.1°F | Resp 18 | Wt 184.0 lb

## 2017-10-11 DIAGNOSIS — E559 Vitamin D deficiency, unspecified: Secondary | ICD-10-CM

## 2017-10-11 DIAGNOSIS — K5730 Diverticulosis of large intestine without perforation or abscess without bleeding: Secondary | ICD-10-CM

## 2017-10-11 DIAGNOSIS — E119 Type 2 diabetes mellitus without complications: Secondary | ICD-10-CM | POA: Diagnosis not present

## 2017-10-11 DIAGNOSIS — D500 Iron deficiency anemia secondary to blood loss (chronic): Secondary | ICD-10-CM | POA: Diagnosis not present

## 2017-10-11 DIAGNOSIS — Z1231 Encounter for screening mammogram for malignant neoplasm of breast: Secondary | ICD-10-CM

## 2017-10-11 DIAGNOSIS — E669 Obesity, unspecified: Secondary | ICD-10-CM

## 2017-10-11 DIAGNOSIS — K5900 Constipation, unspecified: Secondary | ICD-10-CM

## 2017-10-11 NOTE — Patient Instructions (Signed)
[facility] at [facility]  Discharge Instructions:  You were seen by dr. higgs today.  _______________________________________________________________  Thank you for choosing [facility] at [facility] to provide your oncology and hematology care.  To afford each patient quality time with our providers, please arrive at least 15 minutes before your scheduled appointment.  You need to re-schedule your appointment if you arrive 10 or more minutes late.  We strive to give you quality time with our providers, and arriving late affects you and other patients whose appointments are after yours.  Also, if you no show three or more times for appointments you may be dismissed from the clinic.  Again, thank you for choosing [facility] at [facility]. Our hope is that these requests will allow you access to exceptional care and in a timely manner. _______________________________________________________________  If you have questions after your visit, please contact our office at [phone] between the hours of 8:30 a.m. and 5:00 p.m. Voicemails left after 4:30 p.m. will not be returned until the following business day. _______________________________________________________________  For prescription refill requests, have your pharmacy contact our office. _______________________________________________________________  Recommendations made by the consultant and any test results will be sent to your referring physician. _______________________________________________________________ 

## 2017-10-11 NOTE — Progress Notes (Signed)
Diagnosis Iron deficiency anemia due to chronic blood loss - Plan: MM Digital Screening, CBC with Differential/Platelet, Comprehensive metabolic panel, Lactate dehydrogenase, Ferritin  Staging Cancer Staging No matching staging information was found for the patient.  Assessment and Plan:  1.  Iron deficiency anemia.  Pt had Colonoscopy on 09/13/2015 showing diverticuli in sigmoid colon, descending colon and transverse colon, internal and external hemorrhoids banded.  She had EGD on 09/13/2015 showing normal esophagus, small hiatal hernia  Review of chart shows she was last treated with Feraheme in 06/2017.    Labs done 10/08/2017 reviewed and showed WBC 4.8 HB 13.6 plts 190,000.  Chemistries WNL with K+ 3.7, Cr 0.65, and normal LFTs.  Ferritin is 111.  Pt will RTC in 03/2018 for follow-up and repeat labs.  She should notify the office if she has any problems prior to her next visit.    2.  Health maintenance.  She is set up for screening mammogram as last mammogram was in 2014 per record review.    3.  Constipation.  She reports this was due to change in diet.  Symptoms resolved wit Miralax.  Follow-up with GI as recommended.     Current Status:  Pt is seen today for follow-up.  She is here to go over labs.  She denies any recent bleeding episodes.     Problem List Patient Active Problem List   Diagnosis Date Noted  . Obesity (BMI 30-39.9) [E66.9] 08/22/2017  . Other fatigue [R53.83] 07/25/2017  . Shortness of breath on exertion [R06.02] 07/25/2017  . Prediabetes [R73.03] 07/25/2017  . Vitamin D deficiency [E55.9] 07/25/2017  . B12 nutritional deficiency [E53.8] 07/25/2017  . Heart murmur [R01.1] 05/10/2016  . Diverticulosis of colon without hemorrhage [K57.30]   . IDA (iron deficiency anemia) [D50.9]   . Rectal bleeding [K62.5]   . History of melena [Z87.19]   . History of colonic polyps [Z86.010]   . Third degree hemorrhoids [K64.2]   . Reflux esophagitis [K21.0]   . Hiatal  hernia [K44.9]   . Dysphagia, pharyngoesophageal phase [R13.14]   . Adenomatous colon polyp [D12.6] 10/16/2014  . Melena [K92.1] 10/12/2014  . Iron deficiency anemia [D50.9] 06/05/2014  . OSA (obstructive sleep apnea) [G47.33] 01/02/2011  . HEMATOCHEZIA [K92.1] 12/28/2008  . ABDOMINAL PAIN, LEFT LOWER QUADRANT [R10.32] 12/28/2008  . COLONIC POLYPS, ADENOMATOUS, HX OF [Z86.010] 12/28/2008  . GERD [K21.9] 12/25/2008  . ARTHRITIS [M12.9] 12/25/2008  . LOW BACK PAIN, ACUTE [M54.5] 12/25/2008  . Dysphagia [R13.10] 12/25/2008  . PUD, HX OF [Z87.11] 12/25/2008    Past Medical History Past Medical History:  Diagnosis Date  . Adenomatous colon polyp   . Allergic rhinitis   . Arthritis   . Back pain   . Constipation   . Diabetes mellitus   . Diverticulosis   . External hemorrhoids   . Fibromyalgia   . Gallbladder problem   . GERD (gastroesophageal reflux disease)   . Glaucoma   . Glaucoma   . Hemorrhoids    grade 3   . Hip pain   . IBS (irritable bowel syndrome)   . IDA (iron deficiency anemia)   . Iron deficiency anemia 06/05/2014  . Irregular heartbeat   . Leg edema   . Leg numbness   . Leg pain   . Low tension glaucoma   . Neuropathy, leg   . Ovarian cyst 11/20/00  . Prediabetes   . Sleep apnea    CPAP  . Sliding hiatal hernia  Past Surgical History Past Surgical History:  Procedure Laterality Date  . CARPAL TUNNEL RELEASE Bilateral   . CATARACT EXTRACTION     bilateral  . CHOLECYSTECTOMY    . COLONOSCOPY  2005   Small external hemorrhoids, single sigmoid colon diverticulum, small polyp at the hepatic flexure focally adenomatous.  . COLONOSCOPY  2010   Dr. Gala Romney, Versed 4/Demerol 75. Pancolonic diverticula, external hemorrhoids. Colonoscopy every 5 years for history of adenomatous polyps  . COLONOSCOPY  2014   Dr. Olevia Perches: Propofol. mild diverticulosis in the sigmoid colon, random colon biopsies benign.  . COLONOSCOPY WITH PROPOFOL N/A 09/13/2015   Procedure:  COLONOSCOPY WITH PROPOFOL;  Surgeon: Daneil Dolin, MD;  [facility];  Service: Endoscopy;  Laterality: N/A;  1030  . ESOPHAGEAL DILATION N/A 10/26/2014   Procedure: ESOPHAGEAL DILATION [place];  Surgeon: Daneil Dolin, MD;  Location: [facility];  Service: Endoscopy;  Laterality: N/A;  . ESOPHAGOGASTRODUODENOSCOPY  2010   Dr. Gala Romney, distal esophageal erosions, Venia Minks dilator passed. Comment small hiatal hernia, patulous EG junction.  . ESOPHAGOGASTRODUODENOSCOPY (EGD) WITH PROPOFOL N/A 10/26/2014   Dr.Rourk- distal esophageal erosions c/w mild erosive reflux esophagitis. hiatal hernia, o/w normal EGD  . ESOPHAGOGASTRODUODENOSCOPY (EGD) WITH PROPOFOL N/A 09/13/2015   Procedure: ESOPHAGOGASTRODUODENOSCOPY (EGD) WITH PROPOFOL;  Surgeon: Daneil Dolin, MD;  [facility];  Service: Endoscopy;  Laterality: N/A;  . GIVENS CAPSULE STUDY N/A 10/26/2014   couple of mucosal breaks/erosions but essentially unremarkable small bowel study  . HEMORRHOID BANDING    . KNEE SURGERY     right-arthroscopy  . OOPHORECTOMY  age 15s  . PARTIAL HYSTERECTOMY  age 51s  . RECTAL EXAM UNDER ANESTHESIA N/A 10/26/2014   circumferential grade 3 hemoorrhoids easily reducible  . thumb surger Bilateral    trigger thumb    Family History Family History  Problem Relation Age of Onset  . Ulcers Mother   . Glaucoma Mother   . Heart disease Mother   . Diabetes Mother   . Depression Mother   . Sudden death Mother   . Obesity Mother   . Glaucoma Maternal Grandmother   . Heart disease Maternal Grandmother   . Heart disease Maternal Grandfather   . Colon cancer Neg Hx      Social History  reports that she has never smoked. She has never used smokeless tobacco. She reports that she drinks about 1.0 standard drinks of alcohol per week. She reports that she does not use drugs.  Medications  Current Outpatient Medications:  .  acetaminophen (TYLENOL) 500 MG tablet, Take 500 mg by mouth every 6 (six)  hours as needed., Disp: , Rfl:  .  cetirizine (ZYRTEC) 10 MG tablet, Take 10 mg by mouth daily. , Disp: , Rfl:  .  HYDROcodone-acetaminophen (NORCO) 5-325 MG tablet, Take 1 tablet by mouth every 6 (six) hours as needed for moderate pain., Disp: 60 tablet, Rfl: 0 .  metFORMIN (GLUCOPHAGE) 500 MG tablet, Take 2 tablets (1,000 mg total) by mouth daily with breakfast., Disp: 30 tablet, Rfl: 0 .  naproxen sodium (ALEVE) 220 MG tablet, Take 220-440 mg by mouth daily as needed., Disp: , Rfl:  .  polyethylene glycol powder (GLYCOLAX/MIRALAX) powder, Take 17 g by mouth daily., Disp: 3350 g, Rfl: 0 .  Vitamin D, Ergocalciferol, (DRISDOL) 50000 units CAPS capsule, Take 1 capsule (50,000 Units total) by mouth every 7 (seven) days., Disp: 4 capsule, Rfl: 0  Allergies Injectafer [ferric carboxymaltose]  Review of Systems Review  of Systems - Oncology ROS negative other than constipation   Physical Exam  Vitals Wt Readings from Last 3 Encounters:  10/11/17 184 lb (83.5 kg)  10/01/17 186 lb (84.4 kg)  09/13/17 189 lb (85.7 kg)   Temp Readings from Last 3 Encounters:  10/11/17 98.1 F (36.7 C) (Oral)  10/01/17 98.3 F (36.8 C) (Oral)  09/13/17 98.1 F (36.7 C) (Oral)   BP Readings from Last 3 Encounters:  10/11/17 139/62  10/01/17 123/62  09/13/17 (!) 95/48   Pulse Readings from Last 3 Encounters:  10/11/17 71  10/01/17 66  09/13/17 60   Constitutional: Well-developed, well-nourished, and in no distress.   HENT: Head: Normocephalic and atraumatic.  Mouth/Throat: No oropharyngeal exudate. Mucosa moist. Eyes: Pupils are equal, round, and reactive to light. Conjunctivae are normal. No scleral icterus.  Neck: Normal range of motion. Neck supple. No JVD present.  Cardiovascular: Normal rate, regular rhythm and normal heart sounds.  Exam reveals no gallop and no friction rub.   No murmur heard. Pulmonary/Chest: Effort normal and breath sounds normal. No respiratory distress. No wheezes.No  rales.  Abdominal: Soft. Bowel sounds are normal. No distension. There is no tenderness. There is no guarding.  Musculoskeletal: No edema or tenderness.  Lymphadenopathy: No cervical, axillary or supraclavicular adenopathy.  Neurological: Alert and oriented to person, place, and time. No cranial nerve deficit.  Skin: Skin is warm and dry. No rash noted. No erythema. No pallor.  Psychiatric: Affect and judgment normal.   Labs No visits with results within 3 Day(s) from this visit.  Latest known visit with results is:  Appointment on 10/08/2017  Component Date Value Ref Range Status  . WBC 10/08/2017 4.8  4.0 - 10.5 K/uL Final  . RBC 10/08/2017 4.91  3.87 - 5.11 MIL/uL Final  . Hemoglobin 10/08/2017 13.6  12.0 - 15.0 g/dL Final  . HCT 10/08/2017 41.8  36.0 - 46.0 % Final  . MCV 10/08/2017 85.1  78.0 - 100.0 fL Final  . MCH 10/08/2017 27.7  26.0 - 34.0 pg Final  . MCHC 10/08/2017 32.5  30.0 - 36.0 g/dL Final  . RDW 10/08/2017 15.7* 11.5 - 15.5 % Final  . Platelets 10/08/2017 190  150 - 400 K/uL Final  . Neutrophils Relative % 10/08/2017 67  % Final  . Neutro Abs 10/08/2017 3.2  1.7 - 7.7 K/uL Final  . Lymphocytes Relative 10/08/2017 22  % Final  . Lymphs Abs 10/08/2017 1.1  0.7 - 4.0 K/uL Final  . Monocytes Relative 10/08/2017 8  % Final  . Monocytes Absolute 10/08/2017 0.4  0.1 - 1.0 K/uL Final  . Eosinophils Relative 10/08/2017 3  % Final  . Eosinophils Absolute 10/08/2017 0.1  0.0 - 0.7 K/uL Final  . Basophils Relative 10/08/2017 0  % Final  . Basophils Absolute 10/08/2017 0.0  0.0 - 0.1 K/uL Final   Performed at [facility], [address], [place], [place] [address]  . Sodium 10/08/2017 141  135 - 145 mmol/L Final  . Potassium 10/08/2017 3.7  3.5 - 5.1 mmol/L Final  . Chloride 10/08/2017 105  98 - 111 mmol/L Final  . CO2 10/08/2017 28  22 - 32 mmol/L Final  . Glucose, Bld 10/08/2017 129* 70 - 99 mg/dL Final  . BUN 10/08/2017 16  8 - 23 mg/dL Final  . Creatinine, Ser  10/08/2017 0.65  0.44 - 1.00 mg/dL Final  . Calcium 10/08/2017 9.5  8.9 - 10.3 mg/dL Final  . Total Protein 10/08/2017 7.4  6.5 - 8.1 g/dL Final  . Albumin 10/08/2017 4.0  3.5 - 5.0 g/dL Final  . AST 10/08/2017 25  15 - 41 U/L Final  . ALT 10/08/2017 34  0 - 44 U/L Final  . Alkaline Phosphatase 10/08/2017 69  38 - 126 U/L Final  . Total Bilirubin 10/08/2017 1.2  0.3 - 1.2 mg/dL Final  . GFR calc non Af Amer 10/08/2017 >60  >60 mL/min Final  . GFR calc Af Amer 10/08/2017 >60  >60 mL/min Final   Comment: (NOTE) The eGFR has been calculated using the CKD EPI equation. This calculation has not been validated in all clinical situations. eGFR's persistently <60 mL/min signify possible Chronic Kidney Disease.   Georgiann Hahn gap 10/08/2017 8  5 - 15 Final   Performed at [facility], [address], [place], [place] [address]  . Ferritin 10/08/2017 111  11 - 307 ng/mL Final   Performed at [facility], [place] [address] [place], [place] [address]  . Iron 10/08/2017 76  28 - 170 ug/dL Final  . TIBC 10/08/2017 389  250 - 450 ug/dL Final  . Saturation Ratios 10/08/2017 20  10.4 - 31.8 % Final  . UIBC 10/08/2017 313  ug/dL Final   Performed at [facility], [place] [address] [place], [place] [address]     Pathology Orders Placed This Encounter  Procedures  . MM Digital Screening    Standing Status:   Future    Standing Expiration Date:   10/11/2018    Order Specific Question:   Reason for Exam (SYMPTOM  OR DIAGNOSIS REQUIRED)    Answer:   screening    Order Specific Question:   Preferred imaging location?    Answer:   [facility]  . CBC with Differential/Platelet    Standing Status:   Future    Standing Expiration Date:   10/12/2019  . Comprehensive metabolic panel    Standing Status:   Future    Standing Expiration Date:   10/12/2019  . Lactate dehydrogenase    Standing Status:   Future    Standing Expiration Date:   10/12/2019  . Ferritin    Standing Status:   Future     Standing Expiration Date:   10/12/2019       Zoila Shutter MD

## 2017-10-17 ENCOUNTER — Ambulatory Visit (HOSPITAL_COMMUNITY)
Admission: RE | Admit: 2017-10-17 | Discharge: 2017-10-17 | Disposition: A | Discharge status: Home / Self Care | Payer: Medicare Other | Source: Ambulatory Visit | Attending: Internal Medicine | Admitting: Internal Medicine

## 2017-10-17 DIAGNOSIS — Z1231 Encounter for screening mammogram for malignant neoplasm of breast: Secondary | ICD-10-CM | POA: Diagnosis not present

## 2017-10-17 DIAGNOSIS — D500 Iron deficiency anemia secondary to blood loss (chronic): Secondary | ICD-10-CM | POA: Insufficient documentation

## 2017-10-22 ENCOUNTER — Other Ambulatory Visit (HOSPITAL_COMMUNITY): Payer: Self-pay | Admitting: Internal Medicine

## 2017-10-22 ENCOUNTER — Ambulatory Visit (INDEPENDENT_AMBULATORY_CARE_PROVIDER_SITE_OTHER): Payer: Medicare Other | Admitting: Family Medicine

## 2017-10-22 VITALS — BP 112/75 | HR 61 | Temp 98.3°F | Ht 63.0 in | Wt 182.0 lb

## 2017-10-22 DIAGNOSIS — R928 Other abnormal and inconclusive findings on diagnostic imaging of breast: Secondary | ICD-10-CM

## 2017-10-22 DIAGNOSIS — Z6831 Body mass index (BMI) 31.0-31.9, adult: Secondary | ICD-10-CM | POA: Diagnosis not present

## 2017-10-22 DIAGNOSIS — K5909 Other constipation: Secondary | ICD-10-CM | POA: Diagnosis not present

## 2017-10-22 DIAGNOSIS — E669 Obesity, unspecified: Secondary | ICD-10-CM | POA: Diagnosis not present

## 2017-10-22 NOTE — Progress Notes (Signed)
Office: [phone]  /  Fax: [phone]   HPI:   Chief Complaint: OBESITY Debra Harris is here to discuss her progress with her obesity treatment plan. She is on the keep a food journal with [phone] calories and 70+ grams of protein daily and is following her eating plan approximately 99 % of the time. She states she is doing back exercises and stretching for 20-30 minutes 3 times per week. Debra Harris continues to do very well with weight loss even while off her routine caring for her grandson. She likes to follow her Category 1 plan sometimes and she is journaling other days.  Her weight is 182 lb (82.6 kg) today and has had a weight loss of 4 pounds over a period of 3 weeks since her last visit. She has lost 26 lbs since starting treatment with [name].  Constipation Debra Harris still notes BM every 4-5 days and has had some bleeding while very constipated. She is up to date with her colonoscopy. She notes not much improvement with miralax daily OTC. She admits to drinking less H20 recently.  ALLERGIES: Allergies  Allergen Reactions  . Injectafer [Ferric Carboxymaltose]     Has tolerated feraheme without problems    MEDICATIONS: Current Outpatient Medications on File Prior to Visit  Medication Sig Dispense Refill  . acetaminophen (TYLENOL) 500 MG tablet Take 500 mg by mouth every 6 (six) hours as needed.    . cetirizine (ZYRTEC) 10 MG tablet Take 10 mg by mouth daily.     Marland Kitchen HYDROcodone-acetaminophen (NORCO) 5-325 MG tablet Take 1 tablet by mouth every 6 (six) hours as needed for moderate pain. 60 tablet 0  . metFORMIN (GLUCOPHAGE) 500 MG tablet Take 2 tablets (1,000 mg total) by mouth daily with breakfast. 30 tablet 0  . naproxen sodium (ALEVE) 220 MG tablet Take 220-440 mg by mouth daily as needed.    . polyethylene glycol powder (GLYCOLAX/MIRALAX) powder Take 17 g by mouth daily. 3350 g 0  . Vitamin D, Ergocalciferol, (DRISDOL) 50000 units CAPS capsule Take 1 capsule (50,000 Units total) by  mouth every 7 (seven) days. 4 capsule 0   No current facility-administered medications on file prior to visit.     PAST MEDICAL HISTORY: Past Medical History:  Diagnosis Date  . Adenomatous colon polyp   . Allergic rhinitis   . Arthritis   . Back pain   . Constipation   . Diabetes mellitus   . Diverticulosis   . External hemorrhoids   . Fibromyalgia   . Gallbladder problem   . GERD (gastroesophageal reflux disease)   . Glaucoma   . Glaucoma   . Hemorrhoids    grade 3   . Hip pain   . IBS (irritable bowel syndrome)   . IDA (iron deficiency anemia)   . Iron deficiency anemia 06/05/2014  . Irregular heartbeat   . Leg edema   . Leg numbness   . Leg pain   . Low tension glaucoma   . Neuropathy, leg   . Ovarian cyst 11/20/00  . Prediabetes   . Sleep apnea    CPAP  . Sliding hiatal hernia     PAST SURGICAL HISTORY: Past Surgical History:  Procedure Laterality Date  . CARPAL TUNNEL RELEASE Bilateral   . CATARACT EXTRACTION     bilateral  . CHOLECYSTECTOMY    . COLONOSCOPY  2005   Small external hemorrhoids, single sigmoid colon diverticulum, small polyp at the hepatic flexure focally adenomatous.  . COLONOSCOPY  2010  Dr. Gala Romney, Versed 4/Demerol 75. Pancolonic diverticula, external hemorrhoids. Colonoscopy every 5 years for history of adenomatous polyps  . COLONOSCOPY  2014   Dr. Olevia Perches: Propofol. mild diverticulosis in the sigmoid colon, random colon biopsies benign.  . COLONOSCOPY WITH PROPOFOL N/A 09/13/2015   Procedure: COLONOSCOPY WITH PROPOFOL;  Surgeon: Daneil Dolin, MD;  [facility];  Service: Endoscopy;  Laterality: N/A;  1030  . ESOPHAGEAL DILATION N/A 10/26/2014   Procedure: ESOPHAGEAL DILATION [place];  Surgeon: Daneil Dolin, MD;  Location: [facility];  Service: Endoscopy;  Laterality: N/A;  . ESOPHAGOGASTRODUODENOSCOPY  2010   Dr. Gala Romney, distal esophageal erosions, Venia Minks dilator passed. Comment small hiatal hernia, patulous EG junction.  .  ESOPHAGOGASTRODUODENOSCOPY (EGD) WITH PROPOFOL N/A 10/26/2014   Dr.Rourk- distal esophageal erosions c/w mild erosive reflux esophagitis. hiatal hernia, o/w normal EGD  . ESOPHAGOGASTRODUODENOSCOPY (EGD) WITH PROPOFOL N/A 09/13/2015   Procedure: ESOPHAGOGASTRODUODENOSCOPY (EGD) WITH PROPOFOL;  Surgeon: Daneil Dolin, MD;  [facility];  Service: Endoscopy;  Laterality: N/A;  . GIVENS CAPSULE STUDY N/A 10/26/2014   couple of mucosal breaks/erosions but essentially unremarkable small bowel study  . HEMORRHOID BANDING    . KNEE SURGERY     right-arthroscopy  . OOPHORECTOMY  age 36s  . PARTIAL HYSTERECTOMY  age 69s  . RECTAL EXAM UNDER ANESTHESIA N/A 10/26/2014   circumferential grade 3 hemoorrhoids easily reducible  . thumb surger Bilateral    trigger thumb    SOCIAL HISTORY: Social History   Tobacco Use  . Smoking status: Never Smoker  . Smokeless tobacco: Never Used  . Tobacco comment: Pt reports tried once in 7th grade//ms  Substance Use Topics  . Alcohol use: Yes    Alcohol/week: 1.0 standard drinks    Types: 1 Glasses of wine per week    Comment: rare  . Drug use: No    FAMILY HISTORY: Family History  Problem Relation Age of Onset  . Ulcers Mother   . Glaucoma Mother   . Heart disease Mother   . Diabetes Mother   . Depression Mother   . Sudden death Mother   . Obesity Mother   . Glaucoma Maternal Grandmother   . Heart disease Maternal Grandmother   . Heart disease Maternal Grandfather   . Colon cancer Neg Hx     ROS: Review of Systems  Constitutional: Positive for weight loss.  Gastrointestinal: Positive for constipation.       + Rectal bleeding    PHYSICAL EXAM: Blood pressure 112/75, pulse 61, temperature 98.3 F (36.8 C), temperature source Oral, height 5\' 3"  (1.6 m), weight 182 lb (82.6 kg), SpO2 98 %. Body mass index is 32.24 kg/m. Physical Exam  Constitutional: She is oriented to person, place, and time. She appears well-developed and  well-nourished.  Cardiovascular: Normal rate.  Pulmonary/Chest: Effort normal.  Musculoskeletal: Normal range of motion.  Neurological: She is oriented to person, place, and time.  Skin: Skin is warm and dry.  Psychiatric: She has a normal mood and affect. Her behavior is normal.  Vitals reviewed.   RECENT LABS AND TESTS: BMET    Component Value Date/Time   NA 141 10/08/2017 1050   NA 142 07/25/2017 1239   K 3.7 10/08/2017 1050   CL 105 10/08/2017 1050   CO2 28 10/08/2017 1050   GLUCOSE 129 (H) 10/08/2017 1050   BUN 16 10/08/2017 1050   BUN 13 07/25/2017 1239   CREATININE 0.65 10/08/2017 1050   CALCIUM 9.5 10/08/2017  [place] >60 10/08/2017 1050   GFRAA >60 10/08/2017 1050   Lab Results  Component Value Date   HGBA1C 5.5 07/09/2017   Lab Results  Component Value Date   INSULIN 21.9 07/25/2017   CBC    Component Value Date/Time   WBC 4.8 10/08/2017 1050   RBC 4.91 10/08/2017 1050   HGB 13.6 10/08/2017 1050   HCT 41.8 10/08/2017 1050   PLT 190 10/08/2017 1050   MCV 85.1 10/08/2017 1050   MCH 27.7 10/08/2017 1050   MCHC 32.5 10/08/2017 1050   RDW 15.7 (H) 10/08/2017 1050   LYMPHSABS 1.1 10/08/2017 1050   MONOABS 0.4 10/08/2017 1050   EOSABS 0.1 10/08/2017 1050   BASOSABS 0.0 10/08/2017 1050   Iron/TIBC/Ferritin/ %Sat    Component Value Date/Time   IRON 76 10/08/2017 1050   TIBC 389 10/08/2017 1050   FERRITIN 111 10/08/2017 1050   IRONPCTSAT 20 10/08/2017 1050   Lipid Panel     Component Value Date/Time   CHOL 172 07/25/2017 1246   TRIG 144 07/25/2017 1246   HDL 35 (L) 07/25/2017 1246   LDLCALC 108 (H) 07/25/2017 1246   Hepatic Function Panel     Component Value Date/Time   PROT 7.4 10/08/2017 1050   PROT 7.0 07/25/2017 1239   ALBUMIN 4.0 10/08/2017 1050   ALBUMIN 4.5 07/25/2017 1239   AST 25 10/08/2017 1050   ALT 34 10/08/2017 1050   ALKPHOS 69 10/08/2017 1050   BILITOT 1.2 10/08/2017 1050   BILITOT 0.5 07/25/2017 1239        Component Value Date/Time   TSH 0.718 07/25/2017 1239   TSH 0.71 10/22/2012 1030    ASSESSMENT AND PLAN: Other constipation  Class 1 obesity with serious comorbidity and body mass index (BMI) of 31.0 to 31.9 in adult, unspecified obesity type  PLAN:  Constipation Aleane was informed decrease bowel movement frequency is normal while losing weight, but stools should not be hard or painful. Debra Harris agrees to increase miralax to BID and was advised to increase her H20 intake and work on increasing her fiber intake. High fiber foods options were discussed today. Debra Harris agrees to follow up with our clinic in 3 weeks.  Obesity Debra Harris is currently in the action stage of change. As such, her goal is to continue with weight loss efforts She has agreed to keep a food journal with [phone] calories and 70+ grams of protein daily or follow the Category 1 plan Debra Harris has been instructed to work up to a goal of 150 minutes of combined cardio and strengthening exercise per week for weight loss and overall health benefits. We discussed the following Behavioral Modification Strategies today: increasing lean protein intake, decreasing simple carbohydrates, decreasing sodium intake, and increase H20 intake   Debra Harris has agreed to follow up with our clinic in 3 weeks. She was informed of the importance of frequent follow up visits to maximize her success with intensive lifestyle modifications for her multiple health conditions.   OBESITY BEHAVIORAL INTERVENTION VISIT  Today's visit was # 6.   Starting weight: 208 lbs Starting date: 07/25/17 Today's weight : 182 lbs  Today's date: 10/22/2017 Total lbs lost to date: 26 At least 15 minutes were spent on discussing the following behavioral intervention visit.   ASK: We discussed the diagnosis of obesity with Debra Harris today and Debra Harris agreed to give [name] permission to discuss obesity behavioral modification therapy today.  ASSESS: Debra Harris has the  diagnosis of obesity and her  BMI today is 32.25 Debra Harris is in the action stage of change   ADVISE: Debra Harris was educated on the multiple health risks of obesity as well as the benefit of weight loss to improve her health. She was advised of the need for long term treatment and the importance of lifestyle modifications to improve her current health and to decrease her risk of future health problems.  AGREE: Multiple dietary modification options and treatment options were discussed and  Debra Harris agreed to follow the recommendations documented in the above note.  ARRANGE: Debra Harris was educated on the importance of frequent visits to treat obesity as outlined per CMS and USPSTF guidelines and agreed to schedule her next follow up appointment today.  I, Trixie Dredge, am acting as transcriptionist for Dennard Nip, MD  I have reviewed the above documentation for accuracy and completeness, and I agree with the above. -Dennard Nip, MD

## 2017-10-30 ENCOUNTER — Ambulatory Visit (HOSPITAL_COMMUNITY)
Admission: RE | Admit: 2017-10-30 | Discharge: 2017-10-30 | Disposition: A | Discharge status: Home / Self Care | Payer: Medicare Other | Source: Ambulatory Visit | Attending: Internal Medicine | Admitting: Internal Medicine

## 2017-10-30 ENCOUNTER — Encounter (HOSPITAL_COMMUNITY): Payer: Medicare Other

## 2017-10-30 DIAGNOSIS — R928 Other abnormal and inconclusive findings on diagnostic imaging of breast: Secondary | ICD-10-CM | POA: Diagnosis not present

## 2017-10-30 DIAGNOSIS — N6311 Unspecified lump in the right breast, upper outer quadrant: Secondary | ICD-10-CM | POA: Diagnosis not present

## 2017-11-08 ENCOUNTER — Ambulatory Visit (INDEPENDENT_AMBULATORY_CARE_PROVIDER_SITE_OTHER): Payer: Self-pay | Admitting: Family Medicine

## 2017-11-08 ENCOUNTER — Encounter (INDEPENDENT_AMBULATORY_CARE_PROVIDER_SITE_OTHER): Payer: Self-pay | Admitting: Family Medicine

## 2017-11-16 DIAGNOSIS — E119 Type 2 diabetes mellitus without complications: Secondary | ICD-10-CM | POA: Diagnosis not present

## 2017-11-16 LAB — HEMOGLOBIN A1C: Hgb A1c MFr Bld: 6 (ref 4.0–6.0)

## 2017-11-16 LAB — BASIC METABOLIC PANEL: Glucose: 126

## 2017-11-20 ENCOUNTER — Ambulatory Visit (INDEPENDENT_AMBULATORY_CARE_PROVIDER_SITE_OTHER): Payer: Medicare Other | Admitting: Family Medicine

## 2017-11-20 VITALS — BP 109/67 | HR 57 | Temp 98.1°F | Ht 63.0 in | Wt 175.0 lb

## 2017-11-20 DIAGNOSIS — Z6830 Body mass index (BMI) 30.0-30.9, adult: Secondary | ICD-10-CM | POA: Diagnosis not present

## 2017-11-20 DIAGNOSIS — E118 Type 2 diabetes mellitus with unspecified complications: Secondary | ICD-10-CM | POA: Diagnosis not present

## 2017-11-20 DIAGNOSIS — E669 Obesity, unspecified: Secondary | ICD-10-CM | POA: Diagnosis not present

## 2017-11-20 NOTE — Progress Notes (Signed)
Office: [phone]  /  Fax: [phone]   HPI:   Chief Complaint: OBESITY Debra Harris is here to discuss her progress with her obesity treatment plan. She is on the  keep a food journal with [phone]  calories and 70+ g of protein or Category 1 plan and is following her eating plan approximately 95 % of the time. She states she is exercising 30-60 minutes 3 times per week. Debra Harris continues to do well with weight loss. She was initially frustrated at her slow weight loss but is doing well overall. She notes increased hunger mid day.  Her weight is 175 lb (79.4 kg) today and has had a weight loss of 7 pounds over a period of 4 weeks since her last visit. She has lost 33 lbs since starting treatment with [name].  Diabetes II Debra Harris has a diagnosis of diabetes type II. Niva stopped her metformin as her A1c is well controlled but notes increased polyphagia especially mid afternoon. She  denies any hypoglycemic episodes. Last A1c was 5.5 She has been working on intensive lifestyle modifications including diet, exercise, and weight loss to help control her blood glucose levels.  ALLERGIES: Allergies  Allergen Reactions  . Injectafer [Ferric Carboxymaltose]     Has tolerated feraheme without problems    MEDICATIONS: Current Outpatient Medications on File Prior to Visit  Medication Sig Dispense Refill  . acetaminophen (TYLENOL) 500 MG tablet Take 500 mg by mouth every 6 (six) hours as needed.    . cetirizine (ZYRTEC) 10 MG tablet Take 10 mg by mouth daily.     Marland Kitchen HYDROcodone-acetaminophen (NORCO) 5-325 MG tablet Take 1 tablet by mouth every 6 (six) hours as needed for moderate pain. 60 tablet 0  . metFORMIN (GLUCOPHAGE) 500 MG tablet Take 2 tablets (1,000 mg total) by mouth daily with breakfast. 30 tablet 0  . naproxen sodium (ALEVE) 220 MG tablet Take 220-440 mg by mouth daily as needed.    . polyethylene glycol powder (GLYCOLAX/MIRALAX) powder Take 17 g by mouth daily. 3350 g 0  . Vitamin D,  Ergocalciferol, (DRISDOL) 50000 units CAPS capsule Take 1 capsule (50,000 Units total) by mouth every 7 (seven) days. 4 capsule 0   No current facility-administered medications on file prior to visit.     PAST MEDICAL HISTORY: Past Medical History:  Diagnosis Date  . Adenomatous colon polyp   . Allergic rhinitis   . Arthritis   . Back pain   . Constipation   . Diabetes mellitus   . Diverticulosis   . External hemorrhoids   . Fibromyalgia   . Gallbladder problem   . GERD (gastroesophageal reflux disease)   . Glaucoma   . Glaucoma   . Hemorrhoids    grade 3   . Hip pain   . IBS (irritable bowel syndrome)   . IDA (iron deficiency anemia)   . Iron deficiency anemia 06/05/2014  . Irregular heartbeat   . Leg edema   . Leg numbness   . Leg pain   . Low tension glaucoma   . Neuropathy, leg   . Ovarian cyst 11/20/00  . Prediabetes   . Sleep apnea    CPAP  . Sliding hiatal hernia     PAST SURGICAL HISTORY: Past Surgical History:  Procedure Laterality Date  . CARPAL TUNNEL RELEASE Bilateral   . CATARACT EXTRACTION     bilateral  . CHOLECYSTECTOMY    . COLONOSCOPY  2005   Small external hemorrhoids, single sigmoid colon diverticulum, small polyp  at the hepatic flexure focally adenomatous.  . COLONOSCOPY  2010   Dr. Gala Romney, Versed 4/Demerol 75. Pancolonic diverticula, external hemorrhoids. Colonoscopy every 5 years for history of adenomatous polyps  . COLONOSCOPY  2014   Dr. Olevia Perches: Propofol. mild diverticulosis in the sigmoid colon, random colon biopsies benign.  . COLONOSCOPY WITH PROPOFOL N/A 09/13/2015   Procedure: COLONOSCOPY WITH PROPOFOL;  Surgeon: Daneil Dolin, MD;  [facility];  Service: Endoscopy;  Laterality: N/A;  1030  . ESOPHAGEAL DILATION N/A 10/26/2014   Procedure: ESOPHAGEAL DILATION [place];  Surgeon: Daneil Dolin, MD;  Location: [facility];  Service: Endoscopy;  Laterality: N/A;  . ESOPHAGOGASTRODUODENOSCOPY  2010   Dr. Gala Romney, distal esophageal  erosions, Venia Minks dilator passed. Comment small hiatal hernia, patulous EG junction.  . ESOPHAGOGASTRODUODENOSCOPY (EGD) WITH PROPOFOL N/A 10/26/2014   Dr.Rourk- distal esophageal erosions c/w mild erosive reflux esophagitis. hiatal hernia, o/w normal EGD  . ESOPHAGOGASTRODUODENOSCOPY (EGD) WITH PROPOFOL N/A 09/13/2015   Procedure: ESOPHAGOGASTRODUODENOSCOPY (EGD) WITH PROPOFOL;  Surgeon: Daneil Dolin, MD;  [facility];  Service: Endoscopy;  Laterality: N/A;  . GIVENS CAPSULE STUDY N/A 10/26/2014   couple of mucosal breaks/erosions but essentially unremarkable small bowel study  . HEMORRHOID BANDING    . KNEE SURGERY     right-arthroscopy  . OOPHORECTOMY  age 9s  . PARTIAL HYSTERECTOMY  age 56s  . RECTAL EXAM UNDER ANESTHESIA N/A 10/26/2014   circumferential grade 3 hemoorrhoids easily reducible  . thumb surger Bilateral    trigger thumb    SOCIAL HISTORY: Social History   Tobacco Use  . Smoking status: Never Smoker  . Smokeless tobacco: Never Used  . Tobacco comment: Pt reports tried once in 7th grade//ms  Substance Use Topics  . Alcohol use: Yes    Alcohol/week: 1.0 standard drinks    Types: 1 Glasses of wine per week    Comment: rare  . Drug use: No    FAMILY HISTORY: Family History  Problem Relation Age of Onset  . Ulcers Mother   . Glaucoma Mother   . Heart disease Mother   . Diabetes Mother   . Depression Mother   . Sudden death Mother   . Obesity Mother   . Glaucoma Maternal Grandmother   . Heart disease Maternal Grandmother   . Heart disease Maternal Grandfather   . Colon cancer Neg Hx     ROS: Review of Systems  Constitutional: Positive for weight loss.  Endo/Heme/Allergies:       Negative for hypoglycemia Positive for polyphagia     PHYSICAL EXAM: Blood pressure 109/67, pulse (!) 57, temperature 98.1 F (36.7 C), temperature source Oral, height 5\' 3"  (1.6 m), weight 175 lb (79.4 kg), SpO2 98 %. Body mass index is 31 kg/m. Physical  Exam  Constitutional: She is oriented to person, place, and time. She appears well-developed and well-nourished.  HENT:  Head: Normocephalic.  Cardiovascular: Normal rate.  Pulmonary/Chest: Effort normal.  Musculoskeletal: Normal range of motion.  Neurological: She is alert and oriented to person, place, and time.  Skin: Skin is warm and dry.  Psychiatric: She has a normal mood and affect. Her behavior is normal.  Vitals reviewed.   RECENT LABS AND TESTS: BMET    Component Value Date/Time   NA 141 10/08/2017 1050   NA 142 07/25/2017 1239   K 3.7 10/08/2017 1050   CL 105 10/08/2017 1050   CO2 28 10/08/2017 1050   GLUCOSE 129 (H) 10/08/2017 1050  BUN 16 10/08/2017 1050   BUN 13 07/25/2017 1239   CREATININE 0.65 10/08/2017 1050   CALCIUM 9.5 10/08/2017 1050   GFRNONAA >60 10/08/2017 1050   GFRAA >60 10/08/2017 1050   Lab Results  Component Value Date   HGBA1C 5.5 07/09/2017   Lab Results  Component Value Date   INSULIN 21.9 07/25/2017   CBC    Component Value Date/Time   WBC 4.8 10/08/2017 1050   RBC 4.91 10/08/2017 1050   HGB 13.6 10/08/2017 1050   HCT 41.8 10/08/2017 1050   PLT 190 10/08/2017 1050   MCV 85.1 10/08/2017 1050   MCH 27.7 10/08/2017 1050   MCHC 32.5 10/08/2017 1050   RDW 15.7 (H) 10/08/2017 1050   LYMPHSABS 1.1 10/08/2017 1050   MONOABS 0.4 10/08/2017 1050   EOSABS 0.1 10/08/2017 1050   BASOSABS 0.0 10/08/2017 1050   Iron/TIBC/Ferritin/ %Sat    Component Value Date/Time   IRON 76 10/08/2017 1050   TIBC 389 10/08/2017 1050   FERRITIN 111 10/08/2017 1050   IRONPCTSAT 20 10/08/2017 1050   Lipid Panel     Component Value Date/Time   CHOL 172 07/25/2017 1246   TRIG 144 07/25/2017 1246   HDL 35 (L) 07/25/2017 1246   LDLCALC 108 (H) 07/25/2017 1246   Hepatic Function Panel     Component Value Date/Time   PROT 7.4 10/08/2017 1050   PROT 7.0 07/25/2017 1239   ALBUMIN 4.0 10/08/2017 1050   ALBUMIN 4.5 07/25/2017 1239   AST 25 10/08/2017  1050   ALT 34 10/08/2017 1050   ALKPHOS 69 10/08/2017 1050   BILITOT 1.2 10/08/2017 1050   BILITOT 0.5 07/25/2017 1239      Component Value Date/Time   TSH 0.718 07/25/2017 1239   TSH 0.71 10/22/2012 1030    ASSESSMENT AND PLAN: Controlled type 2 diabetes mellitus with complication, without long-term current use of insulin (HCC)  Class 1 obesity with serious comorbidity and body mass index (BMI) of 30.0 to 30.9 in adult, unspecified obesity type  PLAN: Diabetes II Kimiye has been given extensive diabetes education by myself today including ideal fasting and post-prandial blood glucose readings, individual ideal HgA1c goals  and hypoglycemia prevention. We discussed the importance of good blood sugar control to decrease the likelihood of diabetic complications such as nephropathy, neuropathy, limb loss, blindness, coronary artery disease, and death. We discussed the importance of intensive lifestyle modification including diet, exercise and weight loss as the first line treatment for diabetes. Brigette advised of benefits of metformin including decreased polyphagia and decreased likelihood of worsening diabetes. She will follow up at the agreed upon time.  I spent > than 50% of the 25 minute visit on counseling as documented in the note.  Obesity Moncerrath is currently in the action stage of change. As such, her goal is to continue with weight loss efforts She has agreed to keep a food journal with 800 calories and 70+ g of protein  Micki has been instructed to work up to a goal of 150 minutes of combined cardio and strengthening exercise per week or start exercise bands at home daily for weight loss and overall health benefits. We discussed the following Behavioral Modification Stratagies today: increasing lean protein intake and decreasing simple carbohydrates    Aubree has agreed to follow up with our clinic in 3-4 weeks. She was informed of the importance of frequent follow up visits to  maximize her success with intensive lifestyle modifications for her multiple health conditions.  OBESITY BEHAVIORAL INTERVENTION VISIT  Today's visit was # 7   Starting weight: 208 lb Starting date: 07/25/17 Today's weight : 175 lb Today's date: 11/20/2017 Total lbs lost to date: 33 lb At least 15 minutes were spent on discussing the following behavioral intervention visit.   ASK: We discussed the diagnosis of obesity with Garnette Gunner today and Euva agreed to give [name] permission to discuss obesity behavioral modification therapy today.  ASSESS: Adylee has the diagnosis of obesity and her BMI today is 31.01 Marium is in the action stage of change   ADVISE: Rand was educated on the multiple health risks of obesity as well as the benefit of weight loss to improve her health. She was advised of the need for long term treatment and the importance of lifestyle modifications to improve her current health and to decrease her risk of future health problems.  AGREE: Multiple dietary modification options and treatment options were discussed and  Emmylou agreed to follow the recommendations documented in the above note.  ARRANGE: Jaicee was educated on the importance of frequent visits to treat obesity as outlined per CMS and USPSTF guidelines and agreed to schedule her next follow up appointment today.  I, Renee Ramus, am acting as transcriptionist for Dennard Nip, MD   I have reviewed the above documentation for accuracy and completeness, and I agree with the above. -Dennard Nip, MD

## 2017-11-22 DIAGNOSIS — Z23 Encounter for immunization: Secondary | ICD-10-CM | POA: Diagnosis not present

## 2017-11-22 DIAGNOSIS — D509 Iron deficiency anemia, unspecified: Secondary | ICD-10-CM | POA: Diagnosis not present

## 2017-11-22 DIAGNOSIS — E1140 Type 2 diabetes mellitus with diabetic neuropathy, unspecified: Secondary | ICD-10-CM | POA: Diagnosis not present

## 2017-12-11 ENCOUNTER — Ambulatory Visit (INDEPENDENT_AMBULATORY_CARE_PROVIDER_SITE_OTHER): Payer: Medicare Other | Admitting: Family Medicine

## 2017-12-11 VITALS — BP 107/64 | HR 52 | Temp 97.4°F | Ht 64.0 in | Wt 173.0 lb

## 2017-12-11 DIAGNOSIS — E669 Obesity, unspecified: Secondary | ICD-10-CM

## 2017-12-11 DIAGNOSIS — Z6830 Body mass index (BMI) 30.0-30.9, adult: Secondary | ICD-10-CM | POA: Diagnosis not present

## 2017-12-11 DIAGNOSIS — E119 Type 2 diabetes mellitus without complications: Secondary | ICD-10-CM | POA: Diagnosis not present

## 2017-12-11 MED ORDER — METFORMIN HCL 500 MG PO TABS: 500.0000 mg | ORAL_TABLET | Freq: Every day | ORAL | 0 refills | Status: DC

## 2017-12-12 NOTE — Progress Notes (Signed)
Office: [phone]  /  Fax: [phone]   HPI:   Chief Complaint: OBESITY Debra Harris is here to discuss her progress with her obesity treatment plan. She is on the Category 1 plan and is following her eating plan approximately 95 % of the time. She states she is doing yard work for 3 to 4 times per week. Debra Harris continues to do well with weight loss. She is getting ready to do a lot of traveling this fall and would like to discuss travel strategies.  Her weight is 173 lb (78.5 kg) today and has had a weight loss of 2 pounds over a period of 3 weeks since her last visit. She has lost 35 lbs since starting treatment with [name].  Diabetes II Debra Harris has a diagnosis of diabetes type II and it is well controlled on metformin and her diet. She would like to try to decrease her dose of metformin. Her last A1c was 5.5 on 07/09/17. She denies nausea, vomiting, and hypoglycemia. She has been working on intensive lifestyle modifications including diet, exercise, and weight loss to help control her blood glucose levels.  ALLERGIES: Allergies  Allergen Reactions  . Injectafer [Ferric Carboxymaltose]     Has tolerated feraheme without problems    MEDICATIONS: Current Outpatient Medications on File Prior to Visit  Medication Sig Dispense Refill  . acetaminophen (TYLENOL) 500 MG tablet Take 500 mg by mouth every 6 (six) hours as needed.    . cetirizine (ZYRTEC) 10 MG tablet Take 10 mg by mouth daily.     Marland Kitchen HYDROcodone-acetaminophen (NORCO) 5-325 MG tablet Take 1 tablet by mouth every 6 (six) hours as needed for moderate pain. 60 tablet 0  . naproxen sodium (ALEVE) 220 MG tablet Take 220-440 mg by mouth daily as needed.    . polyethylene glycol powder (GLYCOLAX/MIRALAX) powder Take 17 g by mouth daily. 3350 g 0  . Vitamin D, Ergocalciferol, (DRISDOL) 50000 units CAPS capsule Take 1 capsule (50,000 Units total) by mouth every 7 (seven) days. 4 capsule 0   No current facility-administered medications on file  prior to visit.     PAST MEDICAL HISTORY: Past Medical History:  Diagnosis Date  . Adenomatous colon polyp   . Allergic rhinitis   . Arthritis   . Back pain   . Constipation   . Diabetes mellitus   . Diverticulosis   . External hemorrhoids   . Fibromyalgia   . Gallbladder problem   . GERD (gastroesophageal reflux disease)   . Glaucoma   . Glaucoma   . Hemorrhoids    grade 3   . Hip pain   . IBS (irritable bowel syndrome)   . IDA (iron deficiency anemia)   . Iron deficiency anemia 06/05/2014  . Irregular heartbeat   . Leg edema   . Leg numbness   . Leg pain   . Low tension glaucoma   . Neuropathy, leg   . Ovarian cyst 11/20/00  . Prediabetes   . Sleep apnea    CPAP  . Sliding hiatal hernia     PAST SURGICAL HISTORY: Past Surgical History:  Procedure Laterality Date  . CARPAL TUNNEL RELEASE Bilateral   . CATARACT EXTRACTION     bilateral  . CHOLECYSTECTOMY    . COLONOSCOPY  2005   Small external hemorrhoids, single sigmoid colon diverticulum, small polyp at the hepatic flexure focally adenomatous.  . COLONOSCOPY  2010   Dr. Gala Romney, Versed 4/Demerol 75. Pancolonic diverticula, external hemorrhoids. Colonoscopy every 5 years for  history of adenomatous polyps  . COLONOSCOPY  2014   Dr. Olevia Perches: Propofol. mild diverticulosis in the sigmoid colon, random colon biopsies benign.  . COLONOSCOPY WITH PROPOFOL N/A 09/13/2015   Procedure: COLONOSCOPY WITH PROPOFOL;  Surgeon: Daneil Dolin, MD;  [facility];  Service: Endoscopy;  Laterality: N/A;  1030  . ESOPHAGEAL DILATION N/A 10/26/2014   Procedure: ESOPHAGEAL DILATION [place];  Surgeon: Daneil Dolin, MD;  Location: [facility];  Service: Endoscopy;  Laterality: N/A;  . ESOPHAGOGASTRODUODENOSCOPY  2010   Dr. Gala Romney, distal esophageal erosions, Venia Minks dilator passed. Comment small hiatal hernia, patulous EG junction.  . ESOPHAGOGASTRODUODENOSCOPY (EGD) WITH PROPOFOL N/A 10/26/2014   Dr.Rourk- distal esophageal  erosions c/w mild erosive reflux esophagitis. hiatal hernia, o/w normal EGD  . ESOPHAGOGASTRODUODENOSCOPY (EGD) WITH PROPOFOL N/A 09/13/2015   Procedure: ESOPHAGOGASTRODUODENOSCOPY (EGD) WITH PROPOFOL;  Surgeon: Daneil Dolin, MD;  [facility];  Service: Endoscopy;  Laterality: N/A;  . GIVENS CAPSULE STUDY N/A 10/26/2014   couple of mucosal breaks/erosions but essentially unremarkable small bowel study  . HEMORRHOID BANDING    . KNEE SURGERY     right-arthroscopy  . OOPHORECTOMY  age 44s  . PARTIAL HYSTERECTOMY  age 32s  . RECTAL EXAM UNDER ANESTHESIA N/A 10/26/2014   circumferential grade 3 hemoorrhoids easily reducible  . thumb surger Bilateral    trigger thumb    SOCIAL HISTORY: Social History   Tobacco Use  . Smoking status: Never Smoker  . Smokeless tobacco: Never Used  . Tobacco comment: Pt reports tried once in 7th grade//ms  Substance Use Topics  . Alcohol use: Yes    Alcohol/week: 1.0 standard drinks    Types: 1 Glasses of wine per week    Comment: rare  . Drug use: No    FAMILY HISTORY: Family History  Problem Relation Age of Onset  . Ulcers Mother   . Glaucoma Mother   . Heart disease Mother   . Diabetes Mother   . Depression Mother   . Sudden death Mother   . Obesity Mother   . Glaucoma Maternal Grandmother   . Heart disease Maternal Grandmother   . Heart disease Maternal Grandfather   . Colon cancer Neg Hx     ROS: Review of Systems  Gastrointestinal: Negative for nausea and vomiting.  Endo/Heme/Allergies:       Negative for hypoglycemia.    PHYSICAL EXAM: Blood pressure 107/64, pulse (!) 52, temperature (!) 97.4 F (36.3 C), temperature source Oral, height 5\' 4"  (1.626 m), weight 173 lb (78.5 kg), SpO2 100 %. Body mass index is 29.7 kg/m. Physical Exam  Constitutional: She is oriented to person, place, and time. She appears well-developed and well-nourished.  Cardiovascular: Normal rate.  Pulmonary/Chest: Effort normal.    Musculoskeletal: Normal range of motion.  Neurological: She is oriented to person, place, and time.  Skin: Skin is warm and dry.  Psychiatric: She has a normal mood and affect. Her behavior is normal.  Vitals reviewed.   RECENT LABS AND TESTS: BMET    Component Value Date/Time   NA 141 10/08/2017 1050   NA 142 07/25/2017 1239   K 3.7 10/08/2017 1050   CL 105 10/08/2017 1050   CO2 28 10/08/2017 1050   GLUCOSE 129 (H) 10/08/2017 1050   BUN 16 10/08/2017 1050   BUN 13 07/25/2017 1239   CREATININE 0.65 10/08/2017 1050   CALCIUM 9.5 10/08/2017 1050   GFRNONAA >60 10/08/2017 1050   GFRAA >60 10/08/2017 1050  Lab Results  Component Value Date   HGBA1C 5.5 07/09/2017   Lab Results  Component Value Date   INSULIN 21.9 07/25/2017   CBC    Component Value Date/Time   WBC 4.8 10/08/2017 1050   RBC 4.91 10/08/2017 1050   HGB 13.6 10/08/2017 1050   HCT 41.8 10/08/2017 1050   PLT 190 10/08/2017 1050   MCV 85.1 10/08/2017 1050   MCH 27.7 10/08/2017 1050   MCHC 32.5 10/08/2017 1050   RDW 15.7 (H) 10/08/2017 1050   LYMPHSABS 1.1 10/08/2017 1050   MONOABS 0.4 10/08/2017 1050   EOSABS 0.1 10/08/2017 1050   BASOSABS 0.0 10/08/2017 1050   Iron/TIBC/Ferritin/ %Sat    Component Value Date/Time   IRON 76 10/08/2017 1050   TIBC 389 10/08/2017 1050   FERRITIN 111 10/08/2017 1050   IRONPCTSAT 20 10/08/2017 1050   Lipid Panel     Component Value Date/Time   CHOL 172 07/25/2017 1246   TRIG 144 07/25/2017 1246   HDL 35 (L) 07/25/2017 1246   LDLCALC 108 (H) 07/25/2017 1246   Hepatic Function Panel     Component Value Date/Time   PROT 7.4 10/08/2017 1050   PROT 7.0 07/25/2017 1239   ALBUMIN 4.0 10/08/2017 1050   ALBUMIN 4.5 07/25/2017 1239   AST 25 10/08/2017 1050   ALT 34 10/08/2017 1050   ALKPHOS 69 10/08/2017 1050   BILITOT 1.2 10/08/2017 1050   BILITOT 0.5 07/25/2017 1239      Component Value Date/Time   TSH 0.718 07/25/2017 1239   TSH 0.71 10/22/2012 1030    Results for BERNETHA, ANSCHUTZ D "SANDY" (MRN [id number]) as of 12/12/2017 16:30  Ref. Range 07/25/2017 12:39  Vitamin D, 25-Hydroxy Latest Ref Range: 30.0 - 100.0 ng/mL 24.4 (L)   ASSESSMENT AND PLAN: Type 2 diabetes mellitus without complication, without long-term current use of insulin (HCC) - Plan: metFORMIN (GLUCOPHAGE) 500 MG tablet  Class 1 obesity with serious comorbidity and body mass index (BMI) of 30.0 to 30.9 in adult, unspecified obesity type - Starting BMI greater then 30  PLAN:  Diabetes II Rim has been given extensive diabetes education by myself today including ideal fasting and post-prandial blood glucose readings, individual ideal Hgb A1c goals, and hypoglycemia prevention. We discussed the importance of good blood sugar control to decrease the likelihood of diabetic complications such as nephropathy, neuropathy, limb loss, blindness, coronary artery disease, and death. We discussed the importance of intensive lifestyle modification including diet, exercise and weight loss as the first line treatment for diabetes. Tristen agrees to decrease her metformin to 500mg  qAM #30 with no refills and will follow up at the agreed upon time for a fasting appointment in 3 to 4 weeks.  Obesity Nadege is currently in the action stage of change. As such, her goal is to continue with weight loss efforts. She has agreed to follow the Category 1 plan. Xiadani has been instructed to work up to a goal of 150 minutes of combined cardio and strengthening exercise per week for weight loss and overall health benefits. We discussed the following Behavioral Modification Strategies today: increasing lean protein intake, travel eating strategies, and holiday eating strategies  Cass has agreed to follow up with our clinic in 3 to 4 weeks. She was informed of the importance of frequent follow up visits to maximize her success with intensive lifestyle modifications for her multiple health  conditions.   OBESITY BEHAVIORAL INTERVENTION VISIT  Today's visit was # 8  Starting weight: 208 lbs  Starting date: 07/25/17 Today's weight : Weight: 173 lb (78.5 kg)  Today's date: 12/11/2017 Total lbs lost to date: 35 At least 15 minutes were spent on discussing the following behavioral intervention visit.  ASK: We discussed the diagnosis of obesity with Garnette Gunner today and Nalda agreed to give [name] permission to discuss obesity behavioral modification therapy today.  ASSESS: Keta has the diagnosis of obesity and her BMI today is 29.68. Onie is in the action stage of change.   ADVISE: Alese was educated on the multiple health risks of obesity as well as the benefit of weight loss to improve her health. She was advised of the need for long term treatment and the importance of lifestyle modifications to improve her current health and to decrease her risk of future health problems.  AGREE: Multiple dietary modification options and treatment options were discussed and Shenique agreed to follow the recommendations documented in the above note.  ARRANGE: Liticia was educated on the importance of frequent visits to treat obesity as outlined per CMS and USPSTF guidelines and agreed to schedule her next follow up appointment today.  I, Marcille Blanco, am acting as transcriptionist for Starlyn Skeans, MD  I have reviewed the above documentation for accuracy and completeness, and I agree with the above. -Dennard Nip, MD

## 2017-12-14 ENCOUNTER — Encounter (INDEPENDENT_AMBULATORY_CARE_PROVIDER_SITE_OTHER): Payer: Self-pay | Admitting: Family Medicine

## 2017-12-27 ENCOUNTER — Encounter (INDEPENDENT_AMBULATORY_CARE_PROVIDER_SITE_OTHER): Payer: Self-pay

## 2017-12-31 ENCOUNTER — Encounter: Payer: Self-pay | Admitting: *Deleted

## 2017-12-31 ENCOUNTER — Ambulatory Visit (INDEPENDENT_AMBULATORY_CARE_PROVIDER_SITE_OTHER): Payer: Medicare Other | Admitting: Cardiovascular Disease

## 2017-12-31 ENCOUNTER — Encounter: Payer: Self-pay | Admitting: Cardiovascular Disease

## 2017-12-31 ENCOUNTER — Telehealth: Payer: Self-pay | Admitting: *Deleted

## 2017-12-31 ENCOUNTER — Ambulatory Visit (HOSPITAL_COMMUNITY)
Admission: RE | Admit: 2017-12-31 | Discharge: 2017-12-31 | Disposition: A | Discharge status: Home / Self Care | Payer: Medicare Other | Source: Ambulatory Visit | Attending: Cardiovascular Disease | Admitting: Cardiovascular Disease

## 2017-12-31 ENCOUNTER — Other Ambulatory Visit (HOSPITAL_COMMUNITY)
Admission: RE | Admit: 2017-12-31 | Discharge: 2017-12-31 | Disposition: A | Discharge status: Home / Self Care | Payer: Medicare Other | Source: Ambulatory Visit | Attending: Cardiovascular Disease | Admitting: Cardiovascular Disease

## 2017-12-31 VITALS — BP 126/68 | HR 65 | Ht 64.0 in | Wt 175.8 lb

## 2017-12-31 DIAGNOSIS — Z9989 Dependence on other enabling machines and devices: Secondary | ICD-10-CM | POA: Diagnosis not present

## 2017-12-31 DIAGNOSIS — E119 Type 2 diabetes mellitus without complications: Secondary | ICD-10-CM

## 2017-12-31 DIAGNOSIS — Z136 Encounter for screening for cardiovascular disorders: Secondary | ICD-10-CM | POA: Diagnosis not present

## 2017-12-31 DIAGNOSIS — G4733 Obstructive sleep apnea (adult) (pediatric): Secondary | ICD-10-CM

## 2017-12-31 DIAGNOSIS — R079 Chest pain, unspecified: Secondary | ICD-10-CM | POA: Diagnosis not present

## 2017-12-31 LAB — TROPONIN I: Troponin I: <0.03 ng/mL (ref <0.03)

## 2017-12-31 LAB — ECHOCARDIOGRAM COMPLETE
Height: 64 in
Weight: 2812.8 oz

## 2017-12-31 NOTE — Patient Instructions (Signed)
Medication Instructions:  Your physician recommends that you continue on your current medications as directed. Please refer to the Current Medication list given to you today.  If you need a refill on your cardiac medications before your next appointment, please call your pharmacy.   Lab work: Your physician recommends that you return for lab work in: Today    If you have labs (blood work) drawn today and your tests are completely normal, you will receive your results only by: Marland Kitchen MyChart Message (if you have MyChart) OR . A paper copy in the mail If you have any lab test that is abnormal or we need to change your treatment, we will call you to review the results.  Testing/Procedures: Your physician has requested that you have en exercise stress myoview. For further information please visit [email or web address]. Please follow instruction sheet, as given.   Follow-Up: At [facility], you and your health needs are our priority.  As part of our continuing mission to provide you with exceptional heart care, we have created designated Provider Care Teams.  These Care Teams include your primary Cardiologist (physician) and Advanced Practice Providers (APPs -  Physician Assistants and Nurse Practitioners) who all work together to provide you with the care you need, when you need it. You will need a follow up appointment in 1 months.  Please call our office 2 months in advance to schedule this appointment.  You may see No primary care provider on file. or one of the following Advanced Practice Providers on your designated Care Team:   [place], PA-C [facility]) . Ermalinda Barrios, PA-C ([place])  Any Other Special Instructions Will Be Listed Below (If Applicable). Thank you for choosing [facility]!

## 2017-12-31 NOTE — Telephone Encounter (Signed)
Called patient with test results. No answer. Left message to call back.  

## 2017-12-31 NOTE — Progress Notes (Signed)
*  PRELIMINARY RESULTS* Echocardiogram 2D Echocardiogram has been performed.  Debra Harris 12/31/2017, 3:47 PM

## 2017-12-31 NOTE — Telephone Encounter (Signed)
-----   Message from Herminio Commons, MD sent at 12/31/2017  4:46 PM EST ----- Normal. No evidence of acute heart attack based on blood test results.

## 2017-12-31 NOTE — Progress Notes (Signed)
CARDIOLOGY CONSULT NOTE  Patient ID: Debra Harris MRN: [id number] DOB/AGE: 67-Nov-1952 67 y.o.  Admit date: (Not on file) Primary Physician: Asencion Noble, MD Referring Physician: Dr. Willey Blade  Reason for Consultation: Chest pain  HPI: Debra Harris is a 67 y.o. female who is being seen today for the evaluation of chest pain at the request of Asencion Noble, MD.   Her husband is Dr. Sanjuana Kava, an orthopedic surgeon.  He is present today.  Past medical history includes type 2 diabetes mellitus, obstructive sleep apnea, and iron deficiency anemia.  I reviewed the echocardiogram dated 05/16/2016 which demonstrated normal left ventricular systolic and diastolic function and normal regional wall motion, LVEF 55 to 60%, mild focal basal septal hypertrophy, mild aortic annular calcification, and mild tricuspid regurgitation, PASP 30 mmHg.  ECG on 07/25/2017 demonstrated sinus rhythm with no ischemic ST segment or T wave abnormalities, nor any arrhythmias.  She tells me that over the past few weeks she has awoken with left-sided chest pain and pressure radiating down her left arm.  She would try to change positions to see if her pain would go away.  Yesterday in the early morning hours she was awoken by chest pain with radiation down her left arm.  She then awoke her husband.  She had eaten a big meal the night before as they were celebrating their son-in-law's birthday in [place].  Her husband checked her pulse and it was normal.  He gave her some Tums.  She belched shortly thereafter and they thought symptoms could be related to GI issues.  She did have associated nausea with the chest pain.  She has had 1 or 2 episodes of near syncope but has never lost consciousness.  She has lost 40 pounds over the last several months and actually feels more energetic than she had been.  She used to wear compression stockings for bilateral leg swelling but this has since resolved with weight loss.  She  uses CPAP for sleep apnea.  She had been diagnosed with "an irregular heartbeat "in her 17s.  ECG performed in the office today which I ordered and personally interpreted demonstrates sinus bradycardia, 59 bpm, with no ischemic ST segment or T wave abnormalities, nor any arrhythmias.   Family history: Both grandparents died of coronary artery disease at the age of 75.  Her mother died of coronary artery disease at the age of 44.   Allergies  Allergen Reactions  . Injectafer [Ferric Carboxymaltose]     Has tolerated feraheme without problems    Current Outpatient Medications  Medication Sig Dispense Refill  . acetaminophen (TYLENOL) 500 MG tablet Take 500 mg by mouth every 6 (six) hours as needed.    . cetirizine (ZYRTEC) 10 MG tablet Take 10 mg by mouth daily.     . metFORMIN (GLUCOPHAGE) 500 MG tablet Take 1 tablet (500 mg total) by mouth daily with breakfast. 30 tablet 0  . naproxen sodium (ALEVE) 220 MG tablet Take 220-440 mg by mouth daily as needed.     No current facility-administered medications for this visit.     Past Medical History:  Diagnosis Date  . Adenomatous colon polyp   . Allergic rhinitis   . Arthritis   . Back pain   . Constipation   . Diabetes mellitus   . Diverticulosis   . External hemorrhoids   . Fibromyalgia   . Gallbladder problem   . GERD (gastroesophageal reflux disease)   .  Glaucoma   . Glaucoma   . Hemorrhoids    grade 3   . Hip pain   . IBS (irritable bowel syndrome)   . IDA (iron deficiency anemia)   . Iron deficiency anemia 06/05/2014  . Irregular heartbeat   . Leg edema   . Leg numbness   . Leg pain   . Low tension glaucoma   . Neuropathy, leg   . Ovarian cyst 11/20/00  . Prediabetes   . Sleep apnea    CPAP  . Sliding hiatal hernia     Past Surgical History:  Procedure Laterality Date  . CARPAL TUNNEL RELEASE Bilateral   . CATARACT EXTRACTION     bilateral  . CHOLECYSTECTOMY    . COLONOSCOPY  2005   Small external  hemorrhoids, single sigmoid colon diverticulum, small polyp at the hepatic flexure focally adenomatous.  . COLONOSCOPY  2010   Dr. Gala Romney, Versed 4/Demerol 75. Pancolonic diverticula, external hemorrhoids. Colonoscopy every 5 years for history of adenomatous polyps  . COLONOSCOPY  2014   Dr. Olevia Perches: Propofol. mild diverticulosis in the sigmoid colon, random colon biopsies benign.  . COLONOSCOPY WITH PROPOFOL N/A 09/13/2015   Procedure: COLONOSCOPY WITH PROPOFOL;  Surgeon: Daneil Dolin, MD;  Location: [facility];  Service: Endoscopy;  Laterality: N/A;  1030  . ESOPHAGEAL DILATION N/A 10/26/2014   Procedure: ESOPHAGEAL DILATION [place];  Surgeon: Daneil Dolin, MD;  Location: [facility];  Service: Endoscopy;  Laterality: N/A;  . ESOPHAGOGASTRODUODENOSCOPY  2010   Dr. Gala Romney, distal esophageal erosions, Venia Minks dilator passed. Comment small hiatal hernia, patulous EG junction.  . ESOPHAGOGASTRODUODENOSCOPY (EGD) WITH PROPOFOL N/A 10/26/2014   Dr.Rourk- distal esophageal erosions c/w mild erosive reflux esophagitis. hiatal hernia, o/w normal EGD  . ESOPHAGOGASTRODUODENOSCOPY (EGD) WITH PROPOFOL N/A 09/13/2015   Procedure: ESOPHAGOGASTRODUODENOSCOPY (EGD) WITH PROPOFOL;  Surgeon: Daneil Dolin, MD;  Location: [facility];  Service: Endoscopy;  Laterality: N/A;  . GIVENS CAPSULE STUDY N/A 10/26/2014   couple of mucosal breaks/erosions but essentially unremarkable small bowel study  . HEMORRHOID BANDING    . KNEE SURGERY     right-arthroscopy  . OOPHORECTOMY  age 53s  . PARTIAL HYSTERECTOMY  age 21s  . RECTAL EXAM UNDER ANESTHESIA N/A 10/26/2014   circumferential grade 3 hemoorrhoids easily reducible  . thumb surger Bilateral    trigger thumb    Social History   Socioeconomic History  . Marital status: Married    Spouse name: J. Orland Dec, MD  . Number of children: 3  . Years of education: Not on file  . Highest education level: Not on file  Occupational History  . Occupation:  AUDIOLOGIST, retired  Scientific laboratory technician  . Financial resource strain: Not on file  . Food insecurity:    Worry: Not on file    Inability: Not on file  . Transportation needs:    Medical: Not on file    Non-medical: Not on file  Tobacco Use  . Smoking status: Never Smoker  . Smokeless tobacco: Never Used  . Tobacco comment: Pt reports tried once in 7th grade//ms  Substance and Sexual Activity  . Alcohol use: Not Currently    Alcohol/week: 1.0 standard drinks    Types: 1 Glasses of wine per week    Comment: rare  . Drug use: No  . Sexual activity: Never    Birth control/protection: None  Lifestyle  . Physical activity:    Days per week: Not on file    Minutes  per session: Not on file  . Stress: Not on file  Relationships  . Social connections:    Talks on phone: Not on file    Gets together: Not on file    Attends religious service: Not on file    Active member of club or organization: Not on file    Attends meetings of clubs or organizations: Not on file    Relationship status: Not on file  . Intimate partner violence:    Fear of current or ex partner: Not on file    Emotionally abused: Not on file    Physically abused: Not on file    Forced sexual activity: Not on file  Other Topics Concern  . Not on file  Social History Narrative   3 children & married to orthopedist. 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      Current Meds  Medication Sig  . acetaminophen (TYLENOL) 500 MG tablet Take 500 mg by mouth every 6 (six) hours as needed.  . cetirizine (ZYRTEC) 10 MG tablet Take 10 mg by mouth daily.   . metFORMIN (GLUCOPHAGE) 500 MG tablet Take 1 tablet (500 mg total) by mouth daily with breakfast.  . naproxen sodium (ALEVE) 220 MG tablet Take 220-440 mg by mouth daily as needed.      Review of systems complete and found to be negative unless listed above in HPI    Physical exam Height 5\' 4"  (1.626 m), weight 175 lb 12.8 oz (79.7 kg). General: NAD Neck: No  JVD, no thyromegaly or thyroid nodule.  Lungs: Clear to auscultation bilaterally with normal respiratory effort. CV: Nondisplaced PMI. Regular rate and rhythm, normal S1/S2, no S3/S4, no murmur.  No peripheral edema.  No carotid bruit.    Abdomen: Soft, nontender, no distention.  Skin: Intact without lesions or rashes.  Neurologic: Alert and oriented x 3.  Psych: Normal affect. Extremities: No clubbing or cyanosis.  HEENT: Normal.   ECG: Most recent ECG reviewed.   Labs: Lab Results  Component Value Date/Time   K 3.7 10/08/2017 10:50 AM   BUN 16 10/08/2017 10:50 AM   BUN 13 07/25/2017 12:39 PM   CREATININE 0.65 10/08/2017 10:50 AM   ALT 34 10/08/2017 10:50 AM   TSH 0.718 07/25/2017 12:39 PM   HGB 13.6 10/08/2017 10:50 AM     Lipids: Lab Results  Component Value Date/Time   LDLCALC 108 (H) 07/25/2017 12:46 PM   CHOL 172 07/25/2017 12:46 PM   TRIG 144 07/25/2017 12:46 PM   HDL 35 (L) 07/25/2017 12:46 PM        ASSESSMENT AND PLAN:   1.  Chest pain: She has symptoms which have both typical and atypical features with respect to ischemic heart disease.  Primary risk factor is type 2 diabetes mellitus although this is currently well controlled after significant weight loss.  Lipids are controlled. I will order a 2-D echocardiogram with Doppler to evaluate cardiac structure, function, and regional wall motion. I will check a serum troponin. I will proceed with a nuclear myocardial perfusion imaging study to evaluate for ischemic heart disease (exercise Myoview).  2.  Type 2 diabetes mellitus: Good control with hemoglobin A1c of 6% on 11/16/2017.  Currently on metformin.  3.  Obstructive sleep apnea: Uses CPAP.   Disposition: Follow up on 02/05/18 with me.  Signed: Kate Sable, M.D., F.A.C.C.  12/31/2017, 2:17 PM

## 2018-01-03 ENCOUNTER — Encounter (HOSPITAL_COMMUNITY)
Admission: RE | Admit: 2018-01-03 | Discharge: 2018-01-03 | Disposition: A | Discharge status: Home / Self Care | Payer: Medicare Other | Source: Ambulatory Visit | Attending: Cardiovascular Disease | Admitting: Cardiovascular Disease

## 2018-01-03 ENCOUNTER — Encounter (HOSPITAL_BASED_OUTPATIENT_CLINIC_OR_DEPARTMENT_OTHER)
Admission: RE | Admit: 2018-01-03 | Discharge: 2018-01-03 | Disposition: A | Discharge status: Home / Self Care | Payer: Medicare Other | Source: Ambulatory Visit | Attending: Cardiovascular Disease | Admitting: Cardiovascular Disease

## 2018-01-03 ENCOUNTER — Encounter (HOSPITAL_COMMUNITY): Payer: Self-pay

## 2018-01-03 DIAGNOSIS — R079 Chest pain, unspecified: Secondary | ICD-10-CM | POA: Diagnosis not present

## 2018-01-03 LAB — NM MYOCAR MULTI W/SPECT W/WALL MOTION / EF
CHL CUP MPHR: 153 {beats}/min
CHL CUP NUCLEAR SDS: 1
CHL CUP RESTING HR STRESS: 55 {beats}/min
CSEPEDS: 29 s
CSEPHR: 92 %
CSEPPHR: 141 {beats}/min
Estimated workload: 8.1 METS
Exercise duration (min): 7 min
LV dias vol: 65 mL (ref 46–106)
LVSYSVOL: 21 mL
RATE: 0.37
RPE: 13
SRS: 0
SSS: 1
TID: 1.02

## 2018-01-03 MED ORDER — TECHNETIUM TC 99M TETROFOSMIN IV KIT
10.0000 | PACK | Freq: Once | INTRAVENOUS | Status: AC | PRN
  Administered 2018-01-03: 11 via INTRAVENOUS

## 2018-01-03 MED ORDER — REGADENOSON 0.4 MG/5ML IV SOLN
INTRAVENOUS | Status: AC
  Filled 2018-01-03: qty 5

## 2018-01-03 MED ORDER — TECHNETIUM TC 99M TETROFOSMIN IV KIT
30.0000 | PACK | Freq: Once | INTRAVENOUS | Status: AC | PRN
  Administered 2018-01-03: 32 via INTRAVENOUS

## 2018-01-03 MED ORDER — SODIUM CHLORIDE 0.9% FLUSH
INTRAVENOUS | Status: AC
  Administered 2018-01-03: 10 mL via INTRAVENOUS
  Filled 2018-01-03: qty 10

## 2018-01-04 ENCOUNTER — Ambulatory Visit: Payer: Medicare Other | Admitting: Cardiovascular Disease

## 2018-01-07 DIAGNOSIS — H43813 Vitreous degeneration, bilateral: Secondary | ICD-10-CM | POA: Diagnosis not present

## 2018-01-07 DIAGNOSIS — D3131 Benign neoplasm of right choroid: Secondary | ICD-10-CM | POA: Diagnosis not present

## 2018-01-07 DIAGNOSIS — H04123 Dry eye syndrome of bilateral lacrimal glands: Secondary | ICD-10-CM | POA: Diagnosis not present

## 2018-01-07 DIAGNOSIS — Z961 Presence of intraocular lens: Secondary | ICD-10-CM | POA: Diagnosis not present

## 2018-01-08 ENCOUNTER — Ambulatory Visit (INDEPENDENT_AMBULATORY_CARE_PROVIDER_SITE_OTHER): Payer: Medicare Other | Admitting: Family Medicine

## 2018-01-08 VITALS — BP 108/68 | HR 64 | Temp 97.6°F | Ht 64.0 in | Wt 170.0 lb

## 2018-01-08 DIAGNOSIS — E559 Vitamin D deficiency, unspecified: Secondary | ICD-10-CM

## 2018-01-08 DIAGNOSIS — E119 Type 2 diabetes mellitus without complications: Secondary | ICD-10-CM

## 2018-01-08 DIAGNOSIS — Z6830 Body mass index (BMI) 30.0-30.9, adult: Secondary | ICD-10-CM

## 2018-01-08 DIAGNOSIS — E669 Obesity, unspecified: Secondary | ICD-10-CM

## 2018-01-08 DIAGNOSIS — R7303 Prediabetes: Secondary | ICD-10-CM | POA: Diagnosis not present

## 2018-01-08 DIAGNOSIS — I10 Essential (primary) hypertension: Secondary | ICD-10-CM | POA: Diagnosis not present

## 2018-01-08 DIAGNOSIS — R0602 Shortness of breath: Secondary | ICD-10-CM | POA: Diagnosis not present

## 2018-01-09 LAB — LIPID PANEL WITH LDL/HDL RATIO
CHOLESTEROL TOTAL: 179 mg/dL (ref 100–199)
HDL: 42 mg/dL (ref >39)
LDL Calculated: 121 mg/dL — ABNORMAL HIGH (ref 0–99)
LDl/HDL Ratio: 2.9 ratio (ref 0.0–3.2)
Triglycerides: 81 mg/dL (ref 0–149)
VLDL CHOLESTEROL CAL: 16 mg/dL (ref 5–40)

## 2018-01-09 LAB — COMPREHENSIVE METABOLIC PANEL
A/G RATIO: 1.5 (ref 1.2–2.2)
ALBUMIN: 4.2 g/dL (ref 3.6–4.8)
ALT: 15 IU/L (ref 0–32)
AST: 15 IU/L (ref 0–40)
Alkaline Phosphatase: 65 IU/L (ref 39–117)
BILIRUBIN TOTAL: 0.8 mg/dL (ref 0.0–1.2)
BUN / CREAT RATIO: 23 (ref 12–28)
BUN: 15 mg/dL (ref 8–27)
CHLORIDE: 104 mmol/L (ref 96–106)
CO2: 24 mmol/L (ref 20–29)
Calcium: 9.5 mg/dL (ref 8.7–10.3)
Creatinine, Ser: 0.65 mg/dL (ref 0.57–1.00)
GFR calc non Af Amer: 92 mL/min/{1.73_m2} (ref >59)
GFR, EST AFRICAN AMERICAN: 106 mL/min/{1.73_m2} (ref >59)
GLOBULIN, TOTAL: 2.8 g/dL (ref 1.5–4.5)
Glucose: 86 mg/dL (ref 65–99)
POTASSIUM: 4.5 mmol/L (ref 3.5–5.2)
SODIUM: 143 mmol/L (ref 134–144)
Total Protein: 7 g/dL (ref 6.0–8.5)

## 2018-01-09 LAB — HEMOGLOBIN A1C
Est. average glucose Bld gHb Est-mCnc: 114 mg/dL
Hgb A1c MFr Bld: 5.6 % (ref 4.8–5.6)

## 2018-01-09 LAB — INSULIN, RANDOM: INSULIN: 10.4 u[IU]/mL (ref 2.6–24.9)

## 2018-01-09 LAB — VITAMIN D 25 HYDROXY (VIT D DEFICIENCY, FRACTURES): VIT D 25 HYDROXY: 37.7 ng/mL (ref 30.0–100.0)

## 2018-01-09 MED ORDER — METFORMIN HCL 500 MG PO TABS: 500.0000 mg | ORAL_TABLET | Freq: Every day | ORAL | 0 refills | Status: DC

## 2018-01-09 NOTE — Progress Notes (Signed)
Office: [phone]  /  Fax: [phone]   HPI:   Chief Complaint: OBESITY Debra Harris is here to discuss her progress with her obesity treatment plan. She is on the Category 1 plan and is following her eating plan approximately 80 % of the time. She states she is doing yard work and resistance bands for 30 minutes 3 times per week. Debra Harris continues to do well with weight loss on her Category 1 plan. Her hunger is controlled and she would like to discuss holiday strategies.  Her weight is 170 lb (77.1 kg) today and has had a weight loss of 3 pounds over a period of 4 weeks since her last visit. She has lost 38 lbs since starting treatment with [name].  Diabetes II Debra Harris has a diagnosis of diabetes type II. Debra Harris is stable on metformin and diet. She denies nausea, vomiting, or hypoglycemia. Last A1c was 5.6. She has been working on intensive lifestyle modifications including diet, exercise, and weight loss to help control her blood glucose levels.  Vitamin D Deficiency Debra Harris has a diagnosis of vitamin D deficiency. She is stable on OTC Vit D, and she is due for labs. She notes fatigue and denies nausea, vomiting or muscle weakness.  ALLERGIES: Allergies  Allergen Reactions  . Injectafer [Ferric Carboxymaltose]     Has tolerated feraheme without problems    MEDICATIONS: Current Outpatient Medications on File Prior to Visit  Medication Sig Dispense Refill  . acetaminophen (TYLENOL) 500 MG tablet Take 500 mg by mouth every 6 (six) hours as needed.    . cetirizine (ZYRTEC) 10 MG tablet Take 10 mg by mouth daily.     . naproxen sodium (ALEVE) 220 MG tablet Take 220-440 mg by mouth daily as needed.     No current facility-administered medications on file prior to visit.     PAST MEDICAL HISTORY: Past Medical History:  Diagnosis Date  . Adenomatous colon polyp   . Allergic rhinitis   . Arthritis   . Back pain   . Constipation   . Diabetes mellitus   . Diverticulosis   . External  hemorrhoids   . Fibromyalgia   . Gallbladder problem   . GERD (gastroesophageal reflux disease)   . Glaucoma   . Glaucoma   . Hemorrhoids    grade 3   . Hip pain   . IBS (irritable bowel syndrome)   . IDA (iron deficiency anemia)   . Iron deficiency anemia 06/05/2014  . Irregular heartbeat   . Leg edema   . Leg numbness   . Leg pain   . Low tension glaucoma   . Neuropathy, leg   . Ovarian cyst 11/20/00  . Prediabetes   . Sleep apnea    CPAP  . Sliding hiatal hernia     PAST SURGICAL HISTORY: Past Surgical History:  Procedure Laterality Date  . CARPAL TUNNEL RELEASE Bilateral   . CATARACT EXTRACTION     bilateral  . CHOLECYSTECTOMY    . COLONOSCOPY  2005   Small external hemorrhoids, single sigmoid colon diverticulum, small polyp at the hepatic flexure focally adenomatous.  . COLONOSCOPY  2010   Dr. Gala Romney, Versed 4/Demerol 75. Pancolonic diverticula, external hemorrhoids. Colonoscopy every 5 years for history of adenomatous polyps  . COLONOSCOPY  2014   Dr. Olevia Perches: Propofol. mild diverticulosis in the sigmoid colon, random colon biopsies benign.  . COLONOSCOPY WITH PROPOFOL N/A 09/13/2015   Procedure: COLONOSCOPY WITH PROPOFOL;  Surgeon: Daneil Dolin, MD;  [facility]: [facility];  Service: Endoscopy;  Laterality: N/A;  1030  . ESOPHAGEAL DILATION N/A 10/26/2014   Procedure: ESOPHAGEAL DILATION [place];  Surgeon: Daneil Dolin, MD;  Location: [facility];  Service: Endoscopy;  Laterality: N/A;  . ESOPHAGOGASTRODUODENOSCOPY  2010   Dr. Gala Romney, distal esophageal erosions, Venia Minks dilator passed. Comment small hiatal hernia, patulous EG junction.  . ESOPHAGOGASTRODUODENOSCOPY (EGD) WITH PROPOFOL N/A 10/26/2014   Dr.Rourk- distal esophageal erosions c/w mild erosive reflux esophagitis. hiatal hernia, o/w normal EGD  . ESOPHAGOGASTRODUODENOSCOPY (EGD) WITH PROPOFOL N/A 09/13/2015   Procedure: ESOPHAGOGASTRODUODENOSCOPY (EGD) WITH PROPOFOL;  Surgeon: Daneil Dolin, MD;  [facility];  Service: Endoscopy;  Laterality: N/A;  . GIVENS CAPSULE STUDY N/A 10/26/2014   couple of mucosal breaks/erosions but essentially unremarkable small bowel study  . HEMORRHOID BANDING    . KNEE SURGERY     right-arthroscopy  . OOPHORECTOMY  age 69s  . PARTIAL HYSTERECTOMY  age 22s  . RECTAL EXAM UNDER ANESTHESIA N/A 10/26/2014   circumferential grade 3 hemoorrhoids easily reducible  . thumb surger Bilateral    trigger thumb    SOCIAL HISTORY: Social History   Tobacco Use  . Smoking status: Never Smoker  . Smokeless tobacco: Never Used  . Tobacco comment: Pt reports tried once in 7th grade//ms  Substance Use Topics  . Alcohol use: Not Currently    Alcohol/week: 1.0 standard drinks    Types: 1 Glasses of wine per week    Comment: rare  . Drug use: No    FAMILY HISTORY: Family History  Problem Relation Age of Onset  . Ulcers Mother   . Glaucoma Mother   . Heart disease Mother   . Diabetes Mother   . Depression Mother   . Sudden death Mother   . Obesity Mother   . Glaucoma Maternal Grandmother   . Heart disease Maternal Grandmother   . Heart disease Maternal Grandfather   . Colon cancer Neg Hx     ROS: Review of Systems  Constitutional: Positive for malaise/fatigue and weight loss.  Gastrointestinal: Negative for nausea and vomiting.  Musculoskeletal:       Negative muscle weakness  Endo/Heme/Allergies:       Negative hypoglycemia    PHYSICAL EXAM: Blood pressure 108/68, pulse 64, temperature 97.6 F (36.4 C), temperature source Oral, height 5\' 4"  (1.626 m), weight 170 lb (77.1 kg), SpO2 98 %. Body mass index is 29.18 kg/m. Physical Exam  Constitutional: She is oriented to person, place, and time. She appears well-developed and well-nourished.  Cardiovascular: Normal rate.  Pulmonary/Chest: Effort normal.  Musculoskeletal: Normal range of motion.  Neurological: She is oriented to person, place, and time.  Skin: Skin is warm and dry.    Psychiatric: She has a normal mood and affect. Her behavior is normal.  Vitals reviewed.   RECENT LABS AND TESTS: BMET    Component Value Date/Time   NA 143 01/08/2018 1555   K 4.5 01/08/2018 1555   CL 104 01/08/2018 1555   CO2 24 01/08/2018 1555   GLUCOSE 86 01/08/2018 1555   GLUCOSE 129 (H) 10/08/2017 1050   BUN 15 01/08/2018 1555   CREATININE 0.65 01/08/2018 1555   CALCIUM 9.5 01/08/2018 1555   GFRNONAA 92 01/08/2018 1555   GFRAA 106 01/08/2018 1555   Lab Results  Component Value Date   HGBA1C 5.6 01/08/2018   HGBA1C 6.0 11/16/2017   HGBA1C 5.5 07/09/2017   Lab Results  Component Value Date   INSULIN 10.4  01/08/2018   INSULIN 21.9 07/25/2017   CBC    Component Value Date/Time   WBC 4.8 10/08/2017 1050   RBC 4.91 10/08/2017 1050   HGB 13.6 10/08/2017 1050   HCT 41.8 10/08/2017 1050   PLT 190 10/08/2017 1050   MCV 85.1 10/08/2017 1050   MCH 27.7 10/08/2017 1050   MCHC 32.5 10/08/2017 1050   RDW 15.7 (H) 10/08/2017 1050   LYMPHSABS 1.1 10/08/2017 1050   MONOABS 0.4 10/08/2017 1050   EOSABS 0.1 10/08/2017 1050   BASOSABS 0.0 10/08/2017 1050   Iron/TIBC/Ferritin/ %Sat    Component Value Date/Time   IRON 76 10/08/2017 1050   TIBC 389 10/08/2017 1050   FERRITIN 111 10/08/2017 1050   IRONPCTSAT 20 10/08/2017 1050   Lipid Panel     Component Value Date/Time   CHOL 179 01/08/2018 1555   TRIG 81 01/08/2018 1555   HDL 42 01/08/2018 1555   LDLCALC 121 (H) 01/08/2018 1555   Hepatic Function Panel     Component Value Date/Time   PROT 7.0 01/08/2018 1555   ALBUMIN 4.2 01/08/2018 1555   AST 15 01/08/2018 1555   ALT 15 01/08/2018 1555   ALKPHOS 65 01/08/2018 1555   BILITOT 0.8 01/08/2018 1555      Component Value Date/Time   TSH 0.718 07/25/2017 1239   TSH 0.71 10/22/2012 1030  Results for AYMEE, FOMBY D "SANDY" (MRN [id number]) as of 01/09/2018 14:08  Ref. Range 01/08/2018 15:55  Vitamin D, 25-Hydroxy Latest Ref Range: 30.0 - 100.0 ng/mL 37.7     ASSESSMENT AND PLAN: Type 2 diabetes mellitus without complication, without long-term current use of insulin (HCC) - Plan: Hemoglobin A1c, Insulin, random, Lipid Panel With LDL/HDL Ratio, Comprehensive metabolic panel, metFORMIN (GLUCOPHAGE) 500 MG tablet  Vitamin D deficiency - Plan: VITAMIN D 25 Hydroxy (Vit-D Deficiency, Fractures)  Class 1 obesity with serious comorbidity and body mass index (BMI) of 30.0 to 30.9 in adult, unspecified obesity type - BMI greater than 30 at start of program   PLAN:  Diabetes II Debra Harris has been given extensive diabetes education by myself today including ideal fasting and post-prandial blood glucose readings, individual ideal Hgb A1c goals and hypoglycemia prevention. We discussed the importance of good blood sugar control to decrease the likelihood of diabetic complications such as nephropathy, neuropathy, limb loss, blindness, coronary artery disease, and death. We discussed the importance of intensive lifestyle modification including diet, exercise and weight loss as the first line treatment for diabetes. Debra Harris agrees to continue taking metformin 500 mg q AM #30 and we will refill for 1 month. We will check labs and Debra Harris agrees to follow up with our clinic in 3 weeks.  Vitamin D Deficiency Debra Harris was informed that low vitamin D levels contributes to fatigue and are associated with obesity, breast, and colon cancer. Debra Harris agrees to continue taking OTC Vit D and will follow up for routine testing of vitamin D, at least 2-3 times per year. She was informed of the risk of over-replacement of vitamin D and agrees to not increase her dose unless she discusses this with [name] first. We will check labs and Debra Harris agrees to follow up with our clinic in 3 weeks.  Obesity Debra Harris is currently in the action stage of change. As such, her goal is to continue with weight loss efforts She has agreed to keep a food journal with 1000-1200 calories and 70+ grams of protein  daily Debra Harris has been instructed to work up to a goal of 150  minutes of combined cardio and strengthening exercise per week for weight loss and overall health benefits. We discussed the following Behavioral Modification Strategies today: increasing lean protein intake, decreasing simple carbohydrates  and holiday eating strategies    Debra Harris has agreed to follow up with our clinic in 3 weeks. She was informed of the importance of frequent follow up visits to maximize her success with intensive lifestyle modifications for her multiple health conditions.   OBESITY BEHAVIORAL INTERVENTION VISIT  Today's visit was # 9   Starting weight: 208 lbs Starting date: 07/25/17 Today's weight : 170 lbs  Today's date: 01/08/2018 Total lbs lost to date: 38 At least 15 minutes were spent on discussing the following behavioral intervention visit.   ASK: We discussed the diagnosis of obesity with Debra Harris today and Debra Harris agreed to give [name] permission to discuss obesity behavioral modification therapy today.  ASSESS: Debra Harris has the diagnosis of obesity and her BMI today is 29.17 Debra Harris is in the action stage of change   ADVISE: Debra Harris was educated on the multiple health risks of obesity as well as the benefit of weight loss to improve her health. She was advised of the need for long term treatment and the importance of lifestyle modifications to improve her current health and to decrease her risk of future health problems.  AGREE: Multiple dietary modification options and treatment options were discussed and  Debra Harris agreed to follow the recommendations documented in the above note.  ARRANGE: Debra Harris was educated on the importance of frequent visits to treat obesity as outlined per CMS and USPSTF guidelines and agreed to schedule her next follow up appointment today.  I, Trixie Dredge, am acting as transcriptionist for Dennard Nip, MD  I have reviewed the above documentation for accuracy and  completeness, and I agree with the above. -Dennard Nip, MD

## 2018-01-31 ENCOUNTER — Ambulatory Visit (INDEPENDENT_AMBULATORY_CARE_PROVIDER_SITE_OTHER): Payer: Medicare Other | Admitting: Family Medicine

## 2018-01-31 ENCOUNTER — Encounter (INDEPENDENT_AMBULATORY_CARE_PROVIDER_SITE_OTHER): Payer: Self-pay | Admitting: Family Medicine

## 2018-01-31 VITALS — BP 115/57 | HR 58 | Temp 97.9°F | Ht 64.0 in | Wt 173.0 lb

## 2018-01-31 DIAGNOSIS — E119 Type 2 diabetes mellitus without complications: Secondary | ICD-10-CM | POA: Diagnosis not present

## 2018-01-31 DIAGNOSIS — Z6830 Body mass index (BMI) 30.0-30.9, adult: Secondary | ICD-10-CM

## 2018-01-31 DIAGNOSIS — E669 Obesity, unspecified: Secondary | ICD-10-CM

## 2018-01-31 MED ORDER — METFORMIN HCL 500 MG PO TABS: 500.0000 mg | ORAL_TABLET | Freq: Every day | ORAL | 0 refills | Status: DC

## 2018-02-04 DIAGNOSIS — Z6830 Body mass index (BMI) 30.0-30.9, adult: Secondary | ICD-10-CM | POA: Diagnosis not present

## 2018-02-04 DIAGNOSIS — Z01419 Encounter for gynecological examination (general) (routine) without abnormal findings: Secondary | ICD-10-CM | POA: Diagnosis not present

## 2018-02-04 DIAGNOSIS — Z13820 Encounter for screening for osteoporosis: Secondary | ICD-10-CM | POA: Diagnosis not present

## 2018-02-04 DIAGNOSIS — R3121 Asymptomatic microscopic hematuria: Secondary | ICD-10-CM | POA: Diagnosis not present

## 2018-02-04 NOTE — Progress Notes (Signed)
Office: [phone]  /  Fax: [phone]   HPI:   Chief Complaint: OBESITY Debra Harris is here to discuss her progress with her obesity treatment plan. She is keeping a food journal with 1000 to 1200 calories and 70+ grams of protein  and is following her eating plan approximately 5 % of the time. She states she is using resistance bands and doing back exercises 30 minutes 3 times per week. Debra Harris did a lot of traveling and increased eating out. She tried to do portion control and smart choices as she struggled to journal.  Her weight is 173 lb (78.5 kg) today and has had a weight gain of 3 pounds over a period of 3 weeks since her last visit. She has lost 35 lbs since starting treatment with [name].  Pre-Diabetes Debra Harris has a diagnosis of pre-diabetes based on her elevated Hgb A1c and was informed this puts her at greater risk of developing diabetes. She is taking metformin currently and is stable. She continues to work on diet and exercise to decrease risk of diabetes. She denies nausea, vomiting, or hypoglycemia.  ALLERGIES: Allergies  Allergen Reactions  . Injectafer [Ferric Carboxymaltose]     Has tolerated feraheme without problems    MEDICATIONS: Current Outpatient Medications on File Prior to Visit  Medication Sig Dispense Refill  . acetaminophen (TYLENOL) 500 MG tablet Take 500 mg by mouth every 6 (six) hours as needed.    . cetirizine (ZYRTEC) 10 MG tablet Take 10 mg by mouth daily.     . naproxen sodium (ALEVE) 220 MG tablet Take 220-440 mg by mouth daily as needed.     No current facility-administered medications on file prior to visit.     PAST MEDICAL HISTORY: Past Medical History:  Diagnosis Date  . Adenomatous colon polyp   . Allergic rhinitis   . Arthritis   . Back pain   . Constipation   . Diabetes mellitus   . Diverticulosis   . External hemorrhoids   . Fibromyalgia   . Gallbladder problem   . GERD (gastroesophageal reflux disease)   . Glaucoma   .  Glaucoma   . Hemorrhoids    grade 3   . Hip pain   . IBS (irritable bowel syndrome)   . IDA (iron deficiency anemia)   . Iron deficiency anemia 06/05/2014  . Irregular heartbeat   . Leg edema   . Leg numbness   . Leg pain   . Low tension glaucoma   . Neuropathy, leg   . Ovarian cyst 11/20/00  . Prediabetes   . Sleep apnea    CPAP  . Sliding hiatal hernia     PAST SURGICAL HISTORY: Past Surgical History:  Procedure Laterality Date  . CARPAL TUNNEL RELEASE Bilateral   . CATARACT EXTRACTION     bilateral  . CHOLECYSTECTOMY    . COLONOSCOPY  2005   Small external hemorrhoids, single sigmoid colon diverticulum, small polyp at the hepatic flexure focally adenomatous.  . COLONOSCOPY  2010   Dr. Gala Romney, Versed 4/Demerol 75. Pancolonic diverticula, external hemorrhoids. Colonoscopy every 5 years for history of adenomatous polyps  . COLONOSCOPY  2014   Dr. Olevia Perches: Propofol. mild diverticulosis in the sigmoid colon, random colon biopsies benign.  . COLONOSCOPY WITH PROPOFOL N/A 09/13/2015   Procedure: COLONOSCOPY WITH PROPOFOL;  Surgeon: Daneil Dolin, Debra Harris;  [facility]: [facility];  Service: Endoscopy;  Laterality: N/A;  1030  . ESOPHAGEAL DILATION N/A 10/26/2014   Procedure: ESOPHAGEAL DILATION 54  FRENCH;  Surgeon: Daneil Dolin, Debra Harris;  Location: [facility];  Service: Endoscopy;  Laterality: N/A;  . ESOPHAGOGASTRODUODENOSCOPY  2010   Dr. Gala Romney, distal esophageal erosions, Venia Minks dilator passed. Comment small hiatal hernia, patulous EG junction.  . ESOPHAGOGASTRODUODENOSCOPY (EGD) WITH PROPOFOL N/A 10/26/2014   Dr.Rourk- distal esophageal erosions c/w mild erosive reflux esophagitis. hiatal hernia, o/w normal EGD  . ESOPHAGOGASTRODUODENOSCOPY (EGD) WITH PROPOFOL N/A 09/13/2015   Procedure: ESOPHAGOGASTRODUODENOSCOPY (EGD) WITH PROPOFOL;  Surgeon: Daneil Dolin, Debra Harris;  [facility]: [facility];  Service: Endoscopy;  Laterality: N/A;  . GIVENS CAPSULE STUDY N/A 10/26/2014   couple of mucosal  breaks/erosions but essentially unremarkable small bowel study  . HEMORRHOID BANDING    . KNEE SURGERY     right-arthroscopy  . OOPHORECTOMY  age 29s  . PARTIAL HYSTERECTOMY  age 77s  . RECTAL EXAM UNDER ANESTHESIA N/A 10/26/2014   circumferential grade 3 hemoorrhoids easily reducible  . thumb surger Bilateral    trigger thumb    SOCIAL HISTORY: Social History   Tobacco Use  . Smoking status: Never Smoker  . Smokeless tobacco: Never Used  . Tobacco comment: Pt reports tried once in 7th grade//ms  Substance Use Topics  . Alcohol use: Not Currently    Alcohol/week: 1.0 standard drinks    Types: 1 Glasses of wine per week    Comment: rare  . Drug use: No    FAMILY HISTORY: Family History  Problem Relation Age of Onset  . Ulcers Mother   . Glaucoma Mother   . Heart disease Mother   . Diabetes Mother   . Depression Mother   . Sudden death Mother   . Obesity Mother   . Glaucoma Maternal Grandmother   . Heart disease Maternal Grandmother   . Heart disease Maternal Grandfather   . Colon cancer Neg Hx     ROS: Review of Systems  Constitutional: Negative for weight loss.  Gastrointestinal: Negative for nausea and vomiting.  Endo/Heme/Allergies:       Negative for hypoglycemia.    PHYSICAL EXAM: Blood pressure (!) 115/57, pulse (!) 58, temperature 97.9 F (36.6 C), temperature source Oral, height 5\' 4"  (1.626 m), weight 173 lb (78.5 kg), SpO2 100 %. Body mass index is 29.7 kg/m. Physical Exam  Constitutional: She is oriented to person, place, and time. She appears well-developed and well-nourished.  Cardiovascular: Normal rate.  Pulmonary/Chest: Effort normal.  Musculoskeletal: Normal range of motion.  Neurological: She is oriented to person, place, and time.  Skin: Skin is warm and dry.  Psychiatric: She has a normal mood and affect. Her behavior is normal.  Vitals reviewed.   RECENT LABS AND TESTS: BMET    Component Value Date/Time   NA 143 01/08/2018 1555    K 4.5 01/08/2018 1555   CL 104 01/08/2018 1555   CO2 24 01/08/2018 1555   GLUCOSE 86 01/08/2018 1555   GLUCOSE 129 (H) 10/08/2017 1050   BUN 15 01/08/2018 1555   CREATININE 0.65 01/08/2018 1555   CALCIUM 9.5 01/08/2018 1555   GFRNONAA 92 01/08/2018 1555   GFRAA 106 01/08/2018 1555   Lab Results  Component Value Date   HGBA1C 5.6 01/08/2018   HGBA1C 6.0 11/16/2017   HGBA1C 5.5 07/09/2017   Lab Results  Component Value Date   INSULIN 10.4 01/08/2018   INSULIN 21.9 07/25/2017   CBC    Component Value Date/Time   WBC 4.8 10/08/2017 1050   RBC 4.91 10/08/2017 1050   HGB 13.6 10/08/2017 1050  HCT 41.8 10/08/2017 1050   PLT 190 10/08/2017 1050   MCV 85.1 10/08/2017 1050   MCH 27.7 10/08/2017 1050   MCHC 32.5 10/08/2017 1050   RDW 15.7 (H) 10/08/2017 1050   LYMPHSABS 1.1 10/08/2017 1050   MONOABS 0.4 10/08/2017 1050   EOSABS 0.1 10/08/2017 1050   BASOSABS 0.0 10/08/2017 1050   Iron/TIBC/Ferritin/ %Sat    Component Value Date/Time   IRON 76 10/08/2017 1050   TIBC 389 10/08/2017 1050   FERRITIN 111 10/08/2017 1050   IRONPCTSAT 20 10/08/2017 1050   Lipid Panel     Component Value Date/Time   CHOL 179 01/08/2018 1555   TRIG 81 01/08/2018 1555   HDL 42 01/08/2018 1555   LDLCALC 121 (H) 01/08/2018 1555   Hepatic Function Panel     Component Value Date/Time   PROT 7.0 01/08/2018 1555   ALBUMIN 4.2 01/08/2018 1555   AST 15 01/08/2018 1555   ALT 15 01/08/2018 1555   ALKPHOS 65 01/08/2018 1555   BILITOT 0.8 01/08/2018 1555      Component Value Date/Time   TSH 0.718 07/25/2017 1239   TSH 0.71 10/22/2012 1030   Results for SHAUNTAY, BRUNELLI D "SANDY" (MRN [id number]) as of 02/04/2018 15:27  Ref. Range 01/08/2018 15:55  Vitamin D, 25-Hydroxy Latest Ref Range: 30.0 - 100.0 ng/mL 37.7   ASSESSMENT AND PLAN: Type 2 diabetes mellitus without complication, without long-term current use of insulin (HCC) - Plan: metFORMIN (GLUCOPHAGE) 500 MG tablet  Class 1 obesity  with serious comorbidity and body mass index (BMI) of 30.0 to 30.9 in adult, unspecified obesity type - Starting BMI greater then 30  PLAN:  Pre-Diabetes Debra Harris will continue to work on weight loss, exercise, and decreasing simple carbohydrates in her diet to help decrease the risk of diabetes. She was informed that eating too many simple carbohydrates or too many calories at one sitting increases the likelihood of GI side effects. Debra Harris agreed to continue metformin 500mg  with breakfast #30 with no refills and a prescription was written today. Debra Harris agreed to follow up with [name] as directed to monitor her progress in 4 weeks.  Obesity Debra Harris is currently in the action stage of change. As such, her goal is to continue with weight loss efforts. She has agreed to go back to the Category 1 plan. Debra Harris has been instructed to work up to a goal of 150 minutes of combined cardio and strengthening exercise per week for weight loss and overall health benefits. We discussed the following Behavioral Modification Strategies today: increasing lean protein intake, decreasing simple carbohydrates, increasing vegetables, decrease eating out, work on meal planning and easy cooking plans, holiday eating strategies, and celebration eating strategies  Debra Harris has agreed to follow up with our clinic in 4 weeks. She was informed of the importance of frequent follow up visits to maximize her success with intensive lifestyle modifications for her multiple health conditions.   OBESITY BEHAVIORAL INTERVENTION VISIT  Today's visit was # 10   Starting weight: 208 lbs Starting date: 07/25/17 Today's weight : Weight: 173 lb (78.5 kg)  Today's date: 01/31/2018 Total lbs lost to date: 35 At least 15 minutes were spent on discussing the following behavioral intervention visit.  ASK: We discussed the diagnosis of obesity with Debra Harris today and Debra Harris agreed to give [name] permission to discuss obesity behavioral  modification therapy today.  ASSESS: Debra Harris has the diagnosis of obesity and her BMI today is 29.7. Debra Harris is in the action stage of change.  ADVISE: Debra Harris was educated on the multiple health risks of obesity as well as the benefit of weight loss to improve her health. She was advised of the need for long term treatment and the importance of lifestyle modifications to improve her current health and to decrease her risk of future health problems.  AGREE: Multiple dietary modification options and treatment options were discussed and Debra Harris agreed to follow the recommendations documented in the above note.  ARRANGE: Debra Harris was educated on the importance of frequent visits to treat obesity as outlined per CMS and USPSTF guidelines and agreed to schedule her next follow up appointment today.  I, Debra Harris, am acting as transcriptionist for Debra Skeans, Debra Harris  I have reviewed the above documentation for accuracy and completeness, and I agree with the above. -Debra Nip, Debra Harris

## 2018-02-05 ENCOUNTER — Ambulatory Visit (INDEPENDENT_AMBULATORY_CARE_PROVIDER_SITE_OTHER): Payer: Medicare Other | Admitting: Cardiovascular Disease

## 2018-02-05 ENCOUNTER — Encounter: Payer: Self-pay | Admitting: Cardiovascular Disease

## 2018-02-05 ENCOUNTER — Other Ambulatory Visit (HOSPITAL_COMMUNITY): Payer: Self-pay | Admitting: Obstetrics & Gynecology

## 2018-02-05 VITALS — BP 118/64 | HR 81 | Ht 64.0 in | Wt 177.0 lb

## 2018-02-05 DIAGNOSIS — G4733 Obstructive sleep apnea (adult) (pediatric): Secondary | ICD-10-CM | POA: Diagnosis not present

## 2018-02-05 DIAGNOSIS — E119 Type 2 diabetes mellitus without complications: Secondary | ICD-10-CM | POA: Diagnosis not present

## 2018-02-05 DIAGNOSIS — R079 Chest pain, unspecified: Secondary | ICD-10-CM

## 2018-02-05 DIAGNOSIS — Z13820 Encounter for screening for osteoporosis: Secondary | ICD-10-CM

## 2018-02-05 DIAGNOSIS — Z9989 Dependence on other enabling machines and devices: Secondary | ICD-10-CM | POA: Diagnosis not present

## 2018-02-05 NOTE — Patient Instructions (Signed)
Medication Instructions:   Your physician recommends that you continue on your current medications as directed. Please refer to the Current Medication list given to you today.  Labwork:  none  Testing/Procedures:  none  Follow-Up:  Your physician recommends that you schedule a follow-up appointment in: as needed.  Any Other Special Instructions Will Be Listed Below (If Applicable).  If you need a refill on your cardiac medications before your next appointment, please call your pharmacy. 

## 2018-02-05 NOTE — Progress Notes (Signed)
SUBJECTIVE: The patient returns for follow-up after undergoing cardiovascular testing performed for the evaluation of chest pain.  Troponin was normal.  She underwent a low risk nuclear stress test on 01/03/2018 with no evidence of myocardial ischemia or scar, LVEF 68%.  Echocardiogram on 12/31/2017 demonstrated normal left ventricular systolic and diastolic function and normal regional wall motion, LVEF 60 to 65%.  There was moderate focal basal septal hypertrophy and mild tricuspid regurgitation.  Her husband is present.  She denies exertional chest pain and shortness of breath.  She denies leg swelling, orthopnea, dizziness, and paroxysmal nocturnal dyspnea.  She is currently on a high-protein diet.  Her husband told me about how his thumb turned blue but was not cold and gave me some articles describing case reports of this phenomenon known as Achenbach syndrome.  It occurred shortly before Thanksgiving and spontaneous resolved a few days afterwards.  They spent Thanksgiving down in [facility].   Soc Hx: Her husband is Dr. Sanjuana Kava, an orthopedic surgeon.  He is also my patient.   Review of Systems: As per "subjective", otherwise negative.  Allergies  Allergen Reactions  . Injectafer [Ferric Carboxymaltose]     Has tolerated feraheme without problems    Current Outpatient Medications  Medication Sig Dispense Refill  . acetaminophen (TYLENOL) 500 MG tablet Take 500 mg by mouth every 6 (six) hours as needed.    . cetirizine (ZYRTEC) 10 MG tablet Take 10 mg by mouth daily.     . metFORMIN (GLUCOPHAGE) 500 MG tablet Take 1 tablet (500 mg total) by mouth daily with breakfast. 30 tablet 0  . naproxen sodium (ALEVE) 220 MG tablet Take 220-440 mg by mouth daily as needed.     No current facility-administered medications for this visit.     Past Medical History:  Diagnosis Date  . Adenomatous colon polyp   . Allergic rhinitis   . Arthritis   . Back pain   .  Constipation   . Diabetes mellitus   . Diverticulosis   . External hemorrhoids   . Fibromyalgia   . Gallbladder problem   . GERD (gastroesophageal reflux disease)   . Glaucoma   . Glaucoma   . Hemorrhoids    grade 3   . Hip pain   . IBS (irritable bowel syndrome)   . IDA (iron deficiency anemia)   . Iron deficiency anemia 06/05/2014  . Irregular heartbeat   . Leg edema   . Leg numbness   . Leg pain   . Low tension glaucoma   . Neuropathy, leg   . Ovarian cyst 11/20/00  . Prediabetes   . Sleep apnea    CPAP  . Sliding hiatal hernia     Past Surgical History:  Procedure Laterality Date  . CARPAL TUNNEL RELEASE Bilateral   . CATARACT EXTRACTION     bilateral  . CHOLECYSTECTOMY    . COLONOSCOPY  2005   Small external hemorrhoids, single sigmoid colon diverticulum, small polyp at the hepatic flexure focally adenomatous.  . COLONOSCOPY  2010   Dr. Gala Romney, Versed 4/Demerol 75. Pancolonic diverticula, external hemorrhoids. Colonoscopy every 5 years for history of adenomatous polyps  . COLONOSCOPY  2014   Dr. Olevia Perches: Propofol. mild diverticulosis in the sigmoid colon, random colon biopsies benign.  . COLONOSCOPY WITH PROPOFOL N/A 09/13/2015   Procedure: COLONOSCOPY WITH PROPOFOL;  Surgeon: Daneil Dolin, MD;  [facility]: [facility];  Service: Endoscopy;  Laterality: N/A;  1030  .  ESOPHAGEAL DILATION N/A 10/26/2014   Procedure: ESOPHAGEAL DILATION [place];  Surgeon: Daneil Dolin, MD;  Location: [facility];  Service: Endoscopy;  Laterality: N/A;  . ESOPHAGOGASTRODUODENOSCOPY  2010   Dr. Gala Romney, distal esophageal erosions, Venia Minks dilator passed. Comment small hiatal hernia, patulous EG junction.  . ESOPHAGOGASTRODUODENOSCOPY (EGD) WITH PROPOFOL N/A 10/26/2014   Dr.Rourk- distal esophageal erosions c/w mild erosive reflux esophagitis. hiatal hernia, o/w normal EGD  . ESOPHAGOGASTRODUODENOSCOPY (EGD) WITH PROPOFOL N/A 09/13/2015   Procedure: ESOPHAGOGASTRODUODENOSCOPY (EGD) WITH  PROPOFOL;  Surgeon: Daneil Dolin, MD;  [facility];  Service: Endoscopy;  Laterality: N/A;  . GIVENS CAPSULE STUDY N/A 10/26/2014   couple of mucosal breaks/erosions but essentially unremarkable small bowel study  . HEMORRHOID BANDING    . KNEE SURGERY     right-arthroscopy  . OOPHORECTOMY  age 19s  . PARTIAL HYSTERECTOMY  age 39s  . RECTAL EXAM UNDER ANESTHESIA N/A 10/26/2014   circumferential grade 3 hemoorrhoids easily reducible  . thumb surger Bilateral    trigger thumb    Social History   Socioeconomic History  . Marital status: Married    Spouse name: J. Orland Dec, MD  . Number of children: 3  . Years of education: Not on file  . Highest education level: Not on file  Occupational History  . Occupation: AUDIOLOGIST, retired  Scientific laboratory technician  . Financial resource strain: Not on file  . Food insecurity:    Worry: Not on file    Inability: Not on file  . Transportation needs:    Medical: Not on file    Non-medical: Not on file  Tobacco Use  . Smoking status: Never Smoker  . Smokeless tobacco: Never Used  . Tobacco comment: Pt reports tried once in 7th grade//ms  Substance and Sexual Activity  . Alcohol use: Not Currently    Alcohol/week: 1.0 standard drinks    Types: 1 Glasses of wine per week    Comment: rare  . Drug use: No  . Sexual activity: Never    Birth control/protection: None  Lifestyle  . Physical activity:    Days per week: Not on file    Minutes per session: Not on file  . Stress: Not on file  Relationships  . Social connections:    Talks on phone: Not on file    Gets together: Not on file    Attends religious service: Not on file    Active member of club or organization: Not on file    Attends meetings of clubs or organizations: Not on file    Relationship status: Not on file  . Intimate partner violence:    Fear of current or ex partner: Not on file    Emotionally abused: Not on file    Physically abused: Not on file    Forced  sexual activity: Not on file  Other Topics Concern  . Not on file  Social History Narrative   3 children & married to orthopedist. 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     Vitals:   02/05/18 1522  BP: 118/64  Pulse: 81  SpO2: 99%  Weight: 177 lb (80.3 kg)  Height: 5\' 4"  (1.626 m)    Wt Readings from Last 3 Encounters:  02/05/18 177 lb (80.3 kg)  01/31/18 173 lb (78.5 kg)  01/08/18 170 lb (77.1 kg)     PHYSICAL EXAM General: NAD HEENT: Normal. Neck: No JVD, no thyromegaly. Lungs: Clear to auscultation bilaterally with normal respiratory  effort. CV: Regular rate and rhythm, normal S1/S2, no S3/S4, no murmur. No pretibial or periankle edema.  No carotid bruit.   Abdomen: Soft, nontender, no distention.  Neurologic: Alert and oriented.  Psych: Normal affect. Skin: Normal. Musculoskeletal: No gross deformities.    ECG: Reviewed above under Subjective   Labs: Lab Results  Component Value Date/Time   K 4.5 01/08/2018 03:55 PM   BUN 15 01/08/2018 03:55 PM   CREATININE 0.65 01/08/2018 03:55 PM   ALT 15 01/08/2018 03:55 PM   TSH 0.718 07/25/2017 12:39 PM   HGB 13.6 10/08/2017 10:50 AM     Lipids: Lab Results  Component Value Date/Time   LDLCALC 121 (H) 01/08/2018 03:55 PM   CHOL 179 01/08/2018 03:55 PM   TRIG 81 01/08/2018 03:55 PM   HDL 42 01/08/2018 03:55 PM       ASSESSMENT AND PLAN:  1.  Chest pain: Symptomatically improved.  Normal nuclear stress test as detailed above.  No further cardiac testing is indicated at this time.  I told her to inform me should she develop symptom recurrence.  2.  Type 2 diabetes mellitus: Good control with hemoglobin A1c of 5.6% on 01/08/2018.  Currently on metformin.  3.  Obstructive sleep apnea: Uses CPAP.   Disposition: Follow up as needed   Kate Sable, M.D., F.A.C.C.

## 2018-02-15 DIAGNOSIS — D3131 Benign neoplasm of right choroid: Secondary | ICD-10-CM | POA: Diagnosis not present

## 2018-02-15 DIAGNOSIS — H43813 Vitreous degeneration, bilateral: Secondary | ICD-10-CM | POA: Diagnosis not present

## 2018-02-15 DIAGNOSIS — H04123 Dry eye syndrome of bilateral lacrimal glands: Secondary | ICD-10-CM | POA: Diagnosis not present

## 2018-02-15 DIAGNOSIS — Z961 Presence of intraocular lens: Secondary | ICD-10-CM | POA: Diagnosis not present

## 2018-03-07 ENCOUNTER — Encounter (INDEPENDENT_AMBULATORY_CARE_PROVIDER_SITE_OTHER): Payer: Self-pay | Admitting: Family Medicine

## 2018-03-07 ENCOUNTER — Ambulatory Visit (INDEPENDENT_AMBULATORY_CARE_PROVIDER_SITE_OTHER): Payer: Medicare Other | Admitting: Family Medicine

## 2018-03-07 VITALS — BP 127/59 | HR 62 | Temp 97.7°F | Ht 64.0 in | Wt 174.0 lb

## 2018-03-07 DIAGNOSIS — Z6830 Body mass index (BMI) 30.0-30.9, adult: Secondary | ICD-10-CM | POA: Diagnosis not present

## 2018-03-07 DIAGNOSIS — E669 Obesity, unspecified: Secondary | ICD-10-CM | POA: Diagnosis not present

## 2018-03-07 DIAGNOSIS — E119 Type 2 diabetes mellitus without complications: Secondary | ICD-10-CM | POA: Diagnosis not present

## 2018-03-07 MED ORDER — METFORMIN HCL 500 MG PO TABS: 500.0000 mg | ORAL_TABLET | Freq: Every day | ORAL | 0 refills | Status: DC

## 2018-03-11 NOTE — Progress Notes (Signed)
Office: [phone]  /  Fax: [phone]   HPI:   Chief Complaint: OBESITY Debra Harris is here to discuss her progress with her obesity treatment plan. She is following the Category 1 plan and is following her eating plan approximately 90 to 95 % of the time. She states she is using the treadmill and bike 30 minutes 3 times per week. Charleene did well minimizing weight loss over the holidays. Her husband will try to eat with her which should help. She is ready to get back on track now.  Her weight is 174 lb (78.9 kg) today and has had a weight gain of 1 pound over a period of 5 weeks since her last visit. She has lost 34 lbs since starting treatment with [name].  Diabetes II Debra Harris has a diagnosis of diabetes type II. Debra Harris's last A1c was 5.6 on 01/08/18 and is well controlled. She denies nausea, vomiting, and hypoglycemia. She has been working on intensive lifestyle modifications including diet, exercise, and weight loss to help control her blood glucose levels.  ASSESSMENT AND PLAN:  Type 2 diabetes mellitus without complication, without long-term current use of insulin (Debra Harris) - Plan: metFORMIN (GLUCOPHAGE) 500 MG tablet  Class 1 obesity with serious comorbidity and body mass index (BMI) of 30.0 to 30.9 in adult, unspecified obesity type  PLAN:  Diabetes II Debra Harris has been given extensive diabetes education by myself today including ideal fasting and post-prandial blood glucose readings, individual ideal Hgb A1c goals, and hypoglycemia prevention. We discussed the importance of good blood sugar control to decrease the likelihood of diabetic complications such as nephropathy, neuropathy, limb loss, blindness, coronary artery disease, and death. We discussed the importance of intensive lifestyle modification including diet, exercise and weight loss as the first line treatment for diabetes. Debra Harris agrees to continue her diet, exercise, and metformin 500mg  with breakfast qd #30 with no refills and  will follow up at the agreed upon time in 3 to 4 weeks.  Obesity Debra Harris is currently in the action stage of change. As such, her goal is to continue with weight loss efforts. She has agreed to follow the Category 1 plan. Debra Harris has been instructed to work up to a goal of 150 minutes of combined cardio and strengthening exercise per week for weight loss and overall health benefits. We discussed the following Behavioral Modification Strategies today: increasing lean protein intake, decreasing simple carbohydrates, work on meal planning and easy cooking plans, and dealing with family or coworker sabotage.  Debra Harris has agreed to follow up with our clinic in 3 to 4 weeks. She was informed of the importance of frequent follow up visits to maximize her success with intensive lifestyle modifications for her multiple health conditions.  ALLERGIES: Allergies  Allergen Reactions  . Injectafer [Ferric Carboxymaltose]     Has tolerated feraheme without problems    MEDICATIONS: Current Outpatient Medications on File Prior to Visit  Medication Sig Dispense Refill  . acetaminophen (TYLENOL) 500 MG tablet Take 500 mg by mouth every 6 (six) hours as needed.    . cetirizine (ZYRTEC) 10 MG tablet Take 10 mg by mouth daily.     . naproxen sodium (ALEVE) 220 MG tablet Take 220-440 mg by mouth daily as needed.     No current facility-administered medications on file prior to visit.     PAST MEDICAL HISTORY: Past Medical History:  Diagnosis Date  . Adenomatous colon polyp   . Allergic rhinitis   . Arthritis   . Back pain   .  Constipation   . Diabetes mellitus   . Diverticulosis   . External hemorrhoids   . Fibromyalgia   . Gallbladder problem   . GERD (gastroesophageal reflux disease)   . Glaucoma   . Glaucoma   . Hemorrhoids    grade 3   . Hip pain   . IBS (irritable bowel syndrome)   . IDA (iron deficiency anemia)   . Iron deficiency anemia 06/05/2014  . Irregular heartbeat   . Leg edema     . Leg numbness   . Leg pain   . Low tension glaucoma   . Neuropathy, leg   . Ovarian cyst 11/20/00  . Prediabetes   . Sleep apnea    CPAP  . Sliding hiatal hernia     PAST SURGICAL HISTORY: Past Surgical History:  Procedure Laterality Date  . CARPAL TUNNEL RELEASE Bilateral   . CATARACT EXTRACTION     bilateral  . CHOLECYSTECTOMY    . COLONOSCOPY  2005   Small external hemorrhoids, single sigmoid colon diverticulum, small polyp at the hepatic flexure focally adenomatous.  . COLONOSCOPY  2010   Dr. Gala Romney, Versed 4/Demerol 75. Pancolonic diverticula, external hemorrhoids. Colonoscopy every 5 years for history of adenomatous polyps  . COLONOSCOPY  2014   Dr. Olevia Perches: Propofol. mild diverticulosis in the sigmoid colon, random colon biopsies benign.  . COLONOSCOPY WITH PROPOFOL N/A 09/13/2015   Procedure: COLONOSCOPY WITH PROPOFOL;  Surgeon: Daneil Dolin, MD;  [facility];  Service: Endoscopy;  Laterality: N/A;  1030  . ESOPHAGEAL DILATION N/A 10/26/2014   Procedure: ESOPHAGEAL DILATION [place];  Surgeon: Daneil Dolin, MD;  Location: [facility];  Service: Endoscopy;  Laterality: N/A;  . ESOPHAGOGASTRODUODENOSCOPY  2010   Dr. Gala Romney, distal esophageal erosions, Venia Minks dilator passed. Comment small hiatal hernia, patulous EG junction.  . ESOPHAGOGASTRODUODENOSCOPY (EGD) WITH PROPOFOL N/A 10/26/2014   Dr.Rourk- distal esophageal erosions c/w mild erosive reflux esophagitis. hiatal hernia, o/w normal EGD  . ESOPHAGOGASTRODUODENOSCOPY (EGD) WITH PROPOFOL N/A 09/13/2015   Procedure: ESOPHAGOGASTRODUODENOSCOPY (EGD) WITH PROPOFOL;  Surgeon: Daneil Dolin, MD;  [facility];  Service: Endoscopy;  Laterality: N/A;  . GIVENS CAPSULE STUDY N/A 10/26/2014   couple of mucosal breaks/erosions but essentially unremarkable small bowel study  . HEMORRHOID BANDING    . KNEE SURGERY     right-arthroscopy  . OOPHORECTOMY  age 39s  . PARTIAL HYSTERECTOMY  age 64s  . RECTAL EXAM  UNDER ANESTHESIA N/A 10/26/2014   circumferential grade 3 hemoorrhoids easily reducible  . thumb surger Bilateral    trigger thumb    SOCIAL HISTORY: Social History   Tobacco Use  . Smoking status: Never Smoker  . Smokeless tobacco: Never Used  . Tobacco comment: Pt reports tried once in 7th grade//ms  Substance Use Topics  . Alcohol use: Not Currently    Alcohol/week: 1.0 standard drinks    Types: 1 Glasses of wine per week    Comment: rare  . Drug use: No    FAMILY HISTORY: Family History  Problem Relation Age of Onset  . Ulcers Mother   . Glaucoma Mother   . Heart disease Mother   . Diabetes Mother   . Depression Mother   . Sudden death Mother   . Obesity Mother   . Glaucoma Maternal Grandmother   . Heart disease Maternal Grandmother   . Heart disease Maternal Grandfather   . Colon cancer Neg Hx     ROS: Review of Systems  Constitutional: Negative for weight loss.  Gastrointestinal: Negative for nausea and vomiting.  Endo/Heme/Allergies:       Negative for hypoglycemia.    PHYSICAL EXAM: Blood pressure (!) 127/59, pulse 62, temperature 97.7 F (36.5 C), temperature source Oral, height 5\' 4"  (1.626 m), weight 174 lb (78.9 kg), SpO2 99 %. Body mass index is 29.87 kg/m. Physical Exam Vitals signs reviewed.  Constitutional:      Appearance: Normal appearance. She is obese.  Cardiovascular:     Rate and Rhythm: Normal rate.  Pulmonary:     Effort: Pulmonary effort is normal.  Musculoskeletal: Normal range of motion.  Skin:    General: Skin is warm and dry.  Neurological:     Mental Status: She is alert and oriented to person, place, and time.  Psychiatric:        Mood and Affect: Mood normal.        Behavior: Behavior normal.     RECENT LABS AND TESTS: BMET    Component Value Date/Time   NA 143 01/08/2018 1555   K 4.5 01/08/2018 1555   CL 104 01/08/2018 1555   CO2 24 01/08/2018 1555   GLUCOSE 86 01/08/2018 1555   GLUCOSE 129 (H) 10/08/2017  1050   BUN 15 01/08/2018 1555   CREATININE 0.65 01/08/2018 1555   CALCIUM 9.5 01/08/2018 1555   GFRNONAA 92 01/08/2018 1555   GFRAA 106 01/08/2018 1555   Lab Results  Component Value Date   HGBA1C 5.6 01/08/2018   HGBA1C 6.0 11/16/2017   HGBA1C 5.5 07/09/2017   Lab Results  Component Value Date   INSULIN 10.4 01/08/2018   INSULIN 21.9 07/25/2017   CBC    Component Value Date/Time   WBC 4.8 10/08/2017 1050   RBC 4.91 10/08/2017 1050   HGB 13.6 10/08/2017 1050   HCT 41.8 10/08/2017 1050   PLT 190 10/08/2017 1050   MCV 85.1 10/08/2017 1050   MCH 27.7 10/08/2017 1050   MCHC 32.5 10/08/2017 1050   RDW 15.7 (H) 10/08/2017 1050   LYMPHSABS 1.1 10/08/2017 1050   MONOABS 0.4 10/08/2017 1050   EOSABS 0.1 10/08/2017 1050   BASOSABS 0.0 10/08/2017 1050   Iron/TIBC/Ferritin/ %Sat    Component Value Date/Time   IRON 76 10/08/2017 1050   TIBC 389 10/08/2017 1050   FERRITIN 111 10/08/2017 1050   IRONPCTSAT 20 10/08/2017 1050   Lipid Panel     Component Value Date/Time   CHOL 179 01/08/2018 1555   TRIG 81 01/08/2018 1555   HDL 42 01/08/2018 1555   LDLCALC 121 (H) 01/08/2018 1555   Hepatic Function Panel     Component Value Date/Time   PROT 7.0 01/08/2018 1555   ALBUMIN 4.2 01/08/2018 1555   AST 15 01/08/2018 1555   ALT 15 01/08/2018 1555   ALKPHOS 65 01/08/2018 1555   BILITOT 0.8 01/08/2018 1555      Component Value Date/Time   TSH 0.718 07/25/2017 1239   TSH 0.71 10/22/2012 1030   Results for MITZI, LILJA D "SANDY" (MRN [id number]) as of 03/11/2018 11:24  Ref. Range 01/08/2018 15:55  Vitamin D, 25-Hydroxy Latest Ref Range: 30.0 - 100.0 ng/mL 37.7    OBESITY BEHAVIORAL INTERVENTION VISIT  Today's visit was # 11   Starting weight: 208 lbs Starting date: 07/25/17 Today's weight : Weight: 174 lb (78.9 kg)  Today's date: 03/07/2018 Total lbs lost to date: 34 At least 15 minutes were spent on discussing the following behavioral intervention visit.  ASK: We  discussed the  diagnosis of obesity with Garnette Gunner today and Undine agreed to give [name] permission to discuss obesity behavioral modification therapy today.  ASSESS: Shakyla has the diagnosis of obesity and her BMI today is 29.8. Arien is in the action stage of change.   ADVISE: Sonam was educated on the multiple health risks of obesity as well as the benefit of weight loss to improve her health. She was advised of the need for long term treatment and the importance of lifestyle modifications to improve her current health and to decrease her risk of future health problems.  AGREE: Multiple dietary modification options and treatment options were discussed and Darenda agreed to follow the recommendations documented in the above note.  ARRANGE: Dagmar was educated on the importance of frequent visits to treat obesity as outlined per CMS and USPSTF guidelines and agreed to schedule her next follow up appointment today.  I, Marcille Blanco, am acting as transcriptionist for Starlyn Skeans, MD I have reviewed the above documentation for accuracy and completeness, and I agree with the above. -Dennard Nip, MD

## 2018-03-19 DIAGNOSIS — Z79899 Other long term (current) drug therapy: Secondary | ICD-10-CM | POA: Diagnosis not present

## 2018-03-19 DIAGNOSIS — E1140 Type 2 diabetes mellitus with diabetic neuropathy, unspecified: Secondary | ICD-10-CM | POA: Diagnosis not present

## 2018-03-19 DIAGNOSIS — D508 Other iron deficiency anemias: Secondary | ICD-10-CM | POA: Diagnosis not present

## 2018-03-19 DIAGNOSIS — E785 Hyperlipidemia, unspecified: Secondary | ICD-10-CM | POA: Diagnosis not present

## 2018-03-19 LAB — LIPID PANEL
Cholesterol: 162 (ref 0–200)
HDL: 46 (ref 35–70)
LDL Cholesterol: 102
Triglycerides: 70 (ref 40–160)

## 2018-03-19 LAB — HEPATIC FUNCTION PANEL
ALT: 20 (ref 7–35)
AST: 16 (ref 13–35)
Alkaline Phosphatase: 55 (ref 25–125)
BILIRUBIN, TOTAL: 0.5

## 2018-03-19 LAB — BASIC METABOLIC PANEL
BUN: 24 — AB (ref 4–21)
CREATININE: 0.7 (ref 0.5–1.1)
GLUCOSE: 105
Potassium: 5.1 (ref 3.4–5.3)
SODIUM: 144 (ref 137–147)

## 2018-03-19 LAB — HEMOGLOBIN A1C: HEMOGLOBIN A1C: 5.7

## 2018-03-26 DIAGNOSIS — E1140 Type 2 diabetes mellitus with diabetic neuropathy, unspecified: Secondary | ICD-10-CM | POA: Diagnosis not present

## 2018-03-26 DIAGNOSIS — Z6830 Body mass index (BMI) 30.0-30.9, adult: Secondary | ICD-10-CM | POA: Diagnosis not present

## 2018-03-26 DIAGNOSIS — M1990 Unspecified osteoarthritis, unspecified site: Secondary | ICD-10-CM | POA: Diagnosis not present

## 2018-04-02 ENCOUNTER — Ambulatory Visit (INDEPENDENT_AMBULATORY_CARE_PROVIDER_SITE_OTHER): Payer: Medicare Other | Admitting: Family Medicine

## 2018-04-02 ENCOUNTER — Encounter (INDEPENDENT_AMBULATORY_CARE_PROVIDER_SITE_OTHER): Payer: Self-pay | Admitting: Family Medicine

## 2018-04-02 ENCOUNTER — Encounter (INDEPENDENT_AMBULATORY_CARE_PROVIDER_SITE_OTHER): Payer: Self-pay

## 2018-04-02 VITALS — BP 114/64 | HR 56 | Temp 97.9°F | Ht 64.0 in | Wt 170.0 lb

## 2018-04-02 DIAGNOSIS — E669 Obesity, unspecified: Secondary | ICD-10-CM | POA: Diagnosis not present

## 2018-04-02 DIAGNOSIS — K5909 Other constipation: Secondary | ICD-10-CM

## 2018-04-02 DIAGNOSIS — E119 Type 2 diabetes mellitus without complications: Secondary | ICD-10-CM

## 2018-04-02 DIAGNOSIS — Z6830 Body mass index (BMI) 30.0-30.9, adult: Secondary | ICD-10-CM

## 2018-04-02 MED ORDER — METFORMIN HCL 500 MG PO TABS: 500.0000 mg | ORAL_TABLET | Freq: Every day | ORAL | 0 refills | Status: DC

## 2018-04-04 ENCOUNTER — Ambulatory Visit (HOSPITAL_COMMUNITY)
Admission: RE | Admit: 2018-04-04 | Discharge: 2018-04-04 | Disposition: A | Discharge status: Home / Self Care | Payer: Medicare Other | Source: Ambulatory Visit | Attending: Obstetrics & Gynecology | Admitting: Obstetrics & Gynecology

## 2018-04-04 DIAGNOSIS — E2839 Other primary ovarian failure: Secondary | ICD-10-CM | POA: Diagnosis not present

## 2018-04-04 DIAGNOSIS — Z13820 Encounter for screening for osteoporosis: Secondary | ICD-10-CM | POA: Insufficient documentation

## 2018-04-09 NOTE — Progress Notes (Signed)
Office: [phone]  /  Fax: [phone]   HPI:   Chief Complaint: OBESITY Debra Harris is here to discuss her progress with her obesity treatment plan. She is on the Category 1 plan and is following her eating plan approximately 96 % of the time. She states she is doing stretches and back exercises for 30 minutes 3 times per week. Debra Harris continues to do well with weight loss. She has gotten better at meal planning and decreasing eating out.  Her weight is 170 lb (77.1 kg) today and has had a weight loss of 4 pounds over a period of 3 to 4 weeks since her last visit. She has lost 38 lbs since starting treatment with [name].  Constipation Debra Harris notes constipation has improved with increased vegetables and H20 in diet. She denies abdominal pain.   Diabetes II Debra Harris has a diagnosis of diabetes type II. Debra Harris is stable on metformin and denies nausea, vomiting, or hypoglycemia. She is doing well on diet prescription. Last A1c was 5.7. She has been working on intensive lifestyle modifications including diet, exercise, and weight loss to help control her blood glucose levels.  ASSESSMENT AND PLAN:  Type 2 diabetes mellitus without complication, without long-term current use of insulin ([place]) - Plan: metFORMIN (GLUCOPHAGE) 500 MG tablet  Other constipation  Class 1 obesity with serious comorbidity and body mass index (BMI) of 30.0 to 30.9 in adult, unspecified obesity type - Starting BMI greater then 30  PLAN:  Constipation Debra Harris was informed decrease bowel movement frequency is normal while losing weight, but stools should not be hard or painful. She was advised to increase her H20 intake and work on increasing her fiber intake. High fiber foods were discussed today. Kyle will continue diet and exercise and she agrees to follow up with our clinic 4 weeks.  Diabetes II Debra Harris has been given extensive diabetes education by myself today including ideal fasting and post-prandial blood glucose  readings, individual ideal Hgb A1c goals and hypoglycemia prevention. We discussed the importance of good blood sugar control to decrease the likelihood of diabetic complications such as nephropathy, neuropathy, limb loss, blindness, coronary artery disease, and death. We discussed the importance of intensive lifestyle modification including diet, exercise and weight loss as the first line treatment for diabetes. Debra Harris agrees to continue taking metformin 500 mg q Am #30 and we will refill for 1 month. Debra Harris agrees to follow up with our clinic in 4 weeks.  Obesity Debra Harris is currently in the action stage of change. As such, her goal is to continue with weight loss efforts She has agreed to follow the Category 1 plan Debra Harris has been instructed to work up to a goal of 150 minutes of combined cardio and strengthening exercise per week for weight loss and overall health benefits. We discussed the following Behavioral Modification Strategies today: increasing fiber rich foods and increase H20 intake   Debra Harris has agreed to follow up with our clinic in 4 weeks. She was informed of the importance of frequent follow up visits to maximize her success with intensive lifestyle modifications for her multiple health conditions.  ALLERGIES: Allergies  Allergen Reactions  . Injectafer [Ferric Carboxymaltose]     Has tolerated feraheme without problems    MEDICATIONS: Current Outpatient Medications on File Prior to Visit  Medication Sig Dispense Refill  . acetaminophen (TYLENOL) 500 MG tablet Take 500 mg by mouth every 6 (six) hours as needed.    . cetirizine (ZYRTEC) 10 MG tablet Take 10 mg  by mouth daily.     . naproxen sodium (ALEVE) 220 MG tablet Take 220-440 mg by mouth daily as needed.     No current facility-administered medications on file prior to visit.     PAST MEDICAL HISTORY: Past Medical History:  Diagnosis Date  . Adenomatous colon polyp   . Allergic rhinitis   . Arthritis   . Back  pain   . Constipation   . Diabetes mellitus   . Diverticulosis   . External hemorrhoids   . Fibromyalgia   . Gallbladder problem   . GERD (gastroesophageal reflux disease)   . Glaucoma   . Glaucoma   . Hemorrhoids    grade 3   . Hip pain   . IBS (irritable bowel syndrome)   . IDA (iron deficiency anemia)   . Iron deficiency anemia 06/05/2014  . Irregular heartbeat   . Leg edema   . Leg numbness   . Leg pain   . Low tension glaucoma   . Neuropathy, leg   . Ovarian cyst 11/20/00  . Prediabetes   . Sleep apnea    CPAP  . Sliding hiatal hernia     PAST SURGICAL HISTORY: Past Surgical History:  Procedure Laterality Date  . CARPAL TUNNEL RELEASE Bilateral   . CATARACT EXTRACTION     bilateral  . CHOLECYSTECTOMY    . COLONOSCOPY  2005   Small external hemorrhoids, single sigmoid colon diverticulum, small polyp at the hepatic flexure focally adenomatous.  . COLONOSCOPY  2010   Dr. Gala Romney, Versed 4/Demerol 75. Pancolonic diverticula, external hemorrhoids. Colonoscopy every 5 years for history of adenomatous polyps  . COLONOSCOPY  2014   Dr. Olevia Perches: Propofol. mild diverticulosis in the sigmoid colon, random colon biopsies benign.  . COLONOSCOPY WITH PROPOFOL N/A 09/13/2015   Procedure: COLONOSCOPY WITH PROPOFOL;  Surgeon: Daneil Dolin, MD;  [facility];  Service: Endoscopy;  Laterality: N/A;  1030  . ESOPHAGEAL DILATION N/A 10/26/2014   Procedure: ESOPHAGEAL DILATION [place];  Surgeon: Daneil Dolin, MD;  Location: [facility];  Service: Endoscopy;  Laterality: N/A;  . ESOPHAGOGASTRODUODENOSCOPY  2010   Dr. Gala Romney, distal esophageal erosions, Venia Minks dilator passed. Comment small hiatal hernia, patulous EG junction.  . ESOPHAGOGASTRODUODENOSCOPY (EGD) WITH PROPOFOL N/A 10/26/2014   Dr.Rourk- distal esophageal erosions c/w mild erosive reflux esophagitis. hiatal hernia, o/w normal EGD  . ESOPHAGOGASTRODUODENOSCOPY (EGD) WITH PROPOFOL N/A 09/13/2015   Procedure:  ESOPHAGOGASTRODUODENOSCOPY (EGD) WITH PROPOFOL;  Surgeon: Daneil Dolin, MD;  [facility];  Service: Endoscopy;  Laterality: N/A;  . GIVENS CAPSULE STUDY N/A 10/26/2014   couple of mucosal breaks/erosions but essentially unremarkable small bowel study  . HEMORRHOID BANDING    . KNEE SURGERY     right-arthroscopy  . OOPHORECTOMY  age 42s  . PARTIAL HYSTERECTOMY  age 65s  . RECTAL EXAM UNDER ANESTHESIA N/A 10/26/2014   circumferential grade 3 hemoorrhoids easily reducible  . thumb surger Bilateral    trigger thumb    SOCIAL HISTORY: Social History   Tobacco Use  . Smoking status: Never Smoker  . Smokeless tobacco: Never Used  . Tobacco comment: Pt reports tried once in 7th grade//ms  Substance Use Topics  . Alcohol use: Not Currently    Alcohol/week: 1.0 standard drinks    Types: 1 Glasses of wine per week    Comment: rare  . Drug use: No    FAMILY HISTORY: Family History  Problem Relation Age of Onset  . Ulcers  Mother   . Glaucoma Mother   . Heart disease Mother   . Diabetes Mother   . Depression Mother   . Sudden death Mother   . Obesity Mother   . Glaucoma Maternal Grandmother   . Heart disease Maternal Grandmother   . Heart disease Maternal Grandfather   . Colon cancer Neg Hx     ROS: Review of Systems  Constitutional: Positive for weight loss.  Gastrointestinal: Positive for constipation. Negative for abdominal pain, nausea and vomiting.  Endo/Heme/Allergies:       Negative hypoglycemia    PHYSICAL EXAM: Blood pressure 114/64, pulse (!) 56, temperature 97.9 F (36.6 C), temperature source Oral, height 5\' 4"  (1.626 m), weight 170 lb (77.1 kg), SpO2 99 %. Body mass index is 29.18 kg/m. Physical Exam Vitals signs reviewed.  Constitutional:      Appearance: Normal appearance. She is obese.  Cardiovascular:     Rate and Rhythm: Normal rate.     Pulses: Normal pulses.  Pulmonary:     Effort: Pulmonary effort is normal.     Breath sounds:  Normal breath sounds.  Musculoskeletal: Normal range of motion.  Skin:    General: Skin is warm and dry.  Neurological:     Mental Status: She is alert and oriented to person, place, and time.  Psychiatric:        Mood and Affect: Mood normal.        Behavior: Behavior normal.     RECENT LABS AND TESTS: BMET    Component Value Date/Time   NA 144 03/19/2018   K 5.1 03/19/2018   CL 104 01/08/2018 1555   CO2 24 01/08/2018 1555   GLUCOSE 86 01/08/2018 1555   GLUCOSE 129 (H) 10/08/2017 1050   BUN 24 (A) 03/19/2018   CREATININE 0.7 03/19/2018   CREATININE 0.65 01/08/2018 1555   CALCIUM 9.5 01/08/2018 1555   GFRNONAA 92 01/08/2018 1555   GFRAA 106 01/08/2018 1555   Lab Results  Component Value Date   HGBA1C 5.7 03/19/2018   HGBA1C 5.6 01/08/2018   HGBA1C 6.0 11/16/2017   HGBA1C 5.5 07/09/2017   Lab Results  Component Value Date   INSULIN 10.4 01/08/2018   INSULIN 21.9 07/25/2017   CBC    Component Value Date/Time   WBC 4.8 10/08/2017 1050   RBC 4.91 10/08/2017 1050   HGB 13.6 10/08/2017 1050   HCT 41.8 10/08/2017 1050   PLT 190 10/08/2017 1050   MCV 85.1 10/08/2017 1050   MCH 27.7 10/08/2017 1050   MCHC 32.5 10/08/2017 1050   RDW 15.7 (H) 10/08/2017 1050   LYMPHSABS 1.1 10/08/2017 1050   MONOABS 0.4 10/08/2017 1050   EOSABS 0.1 10/08/2017 1050   BASOSABS 0.0 10/08/2017 1050   Iron/TIBC/Ferritin/ %Sat    Component Value Date/Time   IRON 76 10/08/2017 1050   TIBC 389 10/08/2017 1050   FERRITIN 111 10/08/2017 1050   IRONPCTSAT 20 10/08/2017 1050   Lipid Panel     Component Value Date/Time   CHOL 162 03/19/2018   CHOL 179 01/08/2018 1555   TRIG 70 03/19/2018   HDL 46 03/19/2018   HDL 42 01/08/2018 1555   LDLCALC 102 03/19/2018   LDLCALC 121 (H) 01/08/2018 1555   Hepatic Function Panel     Component Value Date/Time   PROT 7.0 01/08/2018 1555   ALBUMIN 4.2 01/08/2018 1555   AST 16 03/19/2018   ALT 20 03/19/2018   ALKPHOS 55 03/19/2018   BILITOT  0.8 01/08/2018 1555  Component Value Date/Time   TSH 0.718 07/25/2017 1239   TSH 0.71 10/22/2012 1030      OBESITY BEHAVIORAL INTERVENTION VISIT  Today's visit was # 12   Starting weight: 208 lbs Starting date: 07/25/17 Today's weight : 170 lbs  Today's date: 04/02/2018 Total lbs lost to date: 38 At least 15 minutes were spent on discussing the following behavioral intervention visit.   ASK: We discussed the diagnosis of obesity with Debra Harris today and Debra Harris agreed to give [name] permission to discuss obesity behavioral modification therapy today.  ASSESS: Debra Harris has the diagnosis of obesity and her BMI today is 29.17 Debra Harris is in the action stage of change   ADVISE: Debra Harris was educated on the multiple health risks of obesity as well as the benefit of weight loss to improve her health. She was advised of the need for long term treatment and the importance of lifestyle modifications to improve her current health and to decrease her risk of future health problems.  AGREE: Multiple dietary modification options and treatment options were discussed and  Debra Harris agreed to follow the recommendations documented in the above note.  ARRANGE: Debra Harris was educated on the importance of frequent visits to treat obesity as outlined per CMS and USPSTF guidelines and agreed to schedule her next follow up appointment today.  I, Debra Harris, am acting as transcriptionist for Dennard Nip, MD  I have reviewed the above documentation for accuracy and completeness, and I agree with the above. -Dennard Nip, MD

## 2018-04-15 ENCOUNTER — Inpatient Hospital Stay (HOSPITAL_COMMUNITY): Payer: Medicare Other | Attending: Hematology

## 2018-04-15 DIAGNOSIS — D509 Iron deficiency anemia, unspecified: Secondary | ICD-10-CM | POA: Diagnosis not present

## 2018-04-15 DIAGNOSIS — D500 Iron deficiency anemia secondary to blood loss (chronic): Secondary | ICD-10-CM

## 2018-04-15 DIAGNOSIS — Z79899 Other long term (current) drug therapy: Secondary | ICD-10-CM | POA: Insufficient documentation

## 2018-04-15 DIAGNOSIS — Z7984 Long term (current) use of oral hypoglycemic drugs: Secondary | ICD-10-CM | POA: Diagnosis not present

## 2018-04-15 DIAGNOSIS — N6012 Diffuse cystic mastopathy of left breast: Secondary | ICD-10-CM | POA: Diagnosis not present

## 2018-04-15 DIAGNOSIS — K921 Melena: Secondary | ICD-10-CM | POA: Diagnosis not present

## 2018-04-15 DIAGNOSIS — E119 Type 2 diabetes mellitus without complications: Secondary | ICD-10-CM | POA: Insufficient documentation

## 2018-04-15 LAB — CBC WITH DIFFERENTIAL/PLATELET
Abs Immature Granulocytes: 0.01 10*3/uL (ref 0.00–0.07)
BASOS ABS: 0 10*3/uL (ref 0.0–0.1)
Basophils Relative: 0 %
EOS ABS: 0.1 10*3/uL (ref 0.0–0.5)
EOS PCT: 2 %
HCT: 41.5 % (ref 36.0–46.0)
Hemoglobin: 12.8 g/dL (ref 12.0–15.0)
Immature Granulocytes: 0 %
Lymphocytes Relative: 25 %
Lymphs Abs: 1.3 10*3/uL (ref 0.7–4.0)
MCH: 27.4 pg (ref 26.0–34.0)
MCHC: 30.8 g/dL (ref 30.0–36.0)
MCV: 88.9 fL (ref 80.0–100.0)
Monocytes Absolute: 0.4 10*3/uL (ref 0.1–1.0)
Monocytes Relative: 8 %
NEUTROS PCT: 65 %
NRBC: 0 % (ref 0.0–0.2)
Neutro Abs: 3.3 10*3/uL (ref 1.7–7.7)
Platelets: 218 10*3/uL (ref 150–400)
RBC: 4.67 MIL/uL (ref 3.87–5.11)
RDW: 13.4 % (ref 11.5–15.5)
WBC: 5.1 10*3/uL (ref 4.0–10.5)

## 2018-04-15 LAB — COMPREHENSIVE METABOLIC PANEL
ALBUMIN: 3.9 g/dL (ref 3.5–5.0)
ALT: 21 U/L (ref 0–44)
ANION GAP: 8 (ref 5–15)
AST: 19 U/L (ref 15–41)
Alkaline Phosphatase: 44 U/L (ref 38–126)
BUN: 22 mg/dL (ref 8–23)
CO2: 29 mmol/L (ref 22–32)
Calcium: 9.5 mg/dL (ref 8.9–10.3)
Chloride: 103 mmol/L (ref 98–111)
Creatinine, Ser: 0.59 mg/dL (ref 0.44–1.00)
GFR calc Af Amer: >60 mL/min (ref >60)
GFR calc non Af Amer: >60 mL/min (ref >60)
GLUCOSE: 109 mg/dL — AB (ref 70–99)
POTASSIUM: 4.2 mmol/L (ref 3.5–5.1)
SODIUM: 140 mmol/L (ref 135–145)
TOTAL PROTEIN: 7.2 g/dL (ref 6.5–8.1)
Total Bilirubin: 0.7 mg/dL (ref 0.3–1.2)

## 2018-04-15 LAB — FERRITIN: Ferritin: 27 ng/mL (ref 11–307)

## 2018-04-15 LAB — LACTATE DEHYDROGENASE: LDH: 121 U/L (ref 98–192)

## 2018-04-17 ENCOUNTER — Other Ambulatory Visit (HOSPITAL_COMMUNITY): Payer: Self-pay | Admitting: Nurse Practitioner

## 2018-04-17 ENCOUNTER — Inpatient Hospital Stay (HOSPITAL_COMMUNITY): Payer: Medicare Other

## 2018-04-17 DIAGNOSIS — Z7984 Long term (current) use of oral hypoglycemic drugs: Secondary | ICD-10-CM | POA: Diagnosis not present

## 2018-04-17 DIAGNOSIS — D509 Iron deficiency anemia, unspecified: Secondary | ICD-10-CM | POA: Diagnosis not present

## 2018-04-17 DIAGNOSIS — K625 Hemorrhage of anus and rectum: Secondary | ICD-10-CM

## 2018-04-17 DIAGNOSIS — N6012 Diffuse cystic mastopathy of left breast: Secondary | ICD-10-CM | POA: Diagnosis not present

## 2018-04-17 DIAGNOSIS — K921 Melena: Secondary | ICD-10-CM | POA: Diagnosis not present

## 2018-04-17 DIAGNOSIS — D500 Iron deficiency anemia secondary to blood loss (chronic): Secondary | ICD-10-CM

## 2018-04-17 DIAGNOSIS — E119 Type 2 diabetes mellitus without complications: Secondary | ICD-10-CM | POA: Diagnosis not present

## 2018-04-17 DIAGNOSIS — Z79899 Other long term (current) drug therapy: Secondary | ICD-10-CM | POA: Diagnosis not present

## 2018-04-17 MED ORDER — SODIUM CHLORIDE 0.9 % IV SOLN
510.0000 mg | Freq: Once | INTRAVENOUS | Status: AC
  Administered 2018-04-17: 510 mg via INTRAVENOUS
  Filled 2018-04-17: qty 510

## 2018-04-17 MED ORDER — SODIUM CHLORIDE 0.9 % IV SOLN
INTRAVENOUS | Status: DC
  Administered 2018-04-17: 14:00:00 via INTRAVENOUS

## 2018-04-17 MED ORDER — SODIUM CHLORIDE 0.9% FLUSH: 3.0000 mL | Freq: Once | INTRAVENOUS | Status: DC

## 2018-04-18 ENCOUNTER — Encounter (HOSPITAL_COMMUNITY): Payer: Self-pay

## 2018-04-18 ENCOUNTER — Other Ambulatory Visit: Payer: Self-pay

## 2018-04-18 NOTE — Progress Notes (Signed)
Iron given per orders. Patient tolerated it well without problems. Vitals stable and discharged home from clinic ambulatory. Follow up as scheduled.  

## 2018-04-22 ENCOUNTER — Encounter (HOSPITAL_COMMUNITY): Payer: Self-pay | Admitting: Hematology

## 2018-04-22 ENCOUNTER — Ambulatory Visit (HOSPITAL_COMMUNITY): Payer: Medicare Other | Admitting: Internal Medicine

## 2018-04-24 ENCOUNTER — Inpatient Hospital Stay (HOSPITAL_BASED_OUTPATIENT_CLINIC_OR_DEPARTMENT_OTHER): Payer: Medicare Other | Admitting: Internal Medicine

## 2018-04-24 ENCOUNTER — Encounter (HOSPITAL_COMMUNITY): Payer: Self-pay | Admitting: Internal Medicine

## 2018-04-24 ENCOUNTER — Inpatient Hospital Stay (HOSPITAL_COMMUNITY): Payer: Medicare Other

## 2018-04-24 VITALS — BP 122/51 | HR 54 | Temp 97.8°F | Resp 16

## 2018-04-24 DIAGNOSIS — D500 Iron deficiency anemia secondary to blood loss (chronic): Secondary | ICD-10-CM

## 2018-04-24 DIAGNOSIS — Z79899 Other long term (current) drug therapy: Secondary | ICD-10-CM

## 2018-04-24 DIAGNOSIS — K921 Melena: Secondary | ICD-10-CM

## 2018-04-24 DIAGNOSIS — E119 Type 2 diabetes mellitus without complications: Secondary | ICD-10-CM | POA: Diagnosis not present

## 2018-04-24 DIAGNOSIS — D509 Iron deficiency anemia, unspecified: Secondary | ICD-10-CM

## 2018-04-24 DIAGNOSIS — Z7984 Long term (current) use of oral hypoglycemic drugs: Secondary | ICD-10-CM | POA: Diagnosis not present

## 2018-04-24 DIAGNOSIS — N6012 Diffuse cystic mastopathy of left breast: Secondary | ICD-10-CM | POA: Diagnosis not present

## 2018-04-24 MED ORDER — SODIUM CHLORIDE 0.9 % IV SOLN
INTRAVENOUS | Status: DC
  Administered 2018-04-24: 09:00:00 via INTRAVENOUS

## 2018-04-24 MED ORDER — SODIUM CHLORIDE 0.9 % IV SOLN
510.0000 mg | Freq: Once | INTRAVENOUS | Status: AC
  Administered 2018-04-24: 510 mg via INTRAVENOUS
  Filled 2018-04-24: qty 17

## 2018-04-24 NOTE — Progress Notes (Signed)
Debra Harris tolerated Feraheme infusion without incident or complaint. VSS. Discharged self ambulatory in satisfactory condition.

## 2018-04-24 NOTE — Patient Instructions (Signed)
[facility] _______________________________________________________________  Thank you for choosing [facility] at [facility] to provide your oncology and hematology care.  To afford each patient quality time with our providers, please arrive at least 15 minutes before your scheduled appointment.  You need to re-schedule your appointment if you arrive 10 or more minutes late.  We strive to give you quality time with our providers, and arriving late affects you and other patients whose appointments are after yours.  Also, if you no show three or more times for appointments you may be dismissed from the clinic.  Again, thank you for choosing [facility] at [facility]. Our hope is that these requests will allow you access to exceptional care and in a timely manner. _______________________________________________________________  If you have questions after your visit, please contact our office at [phone] between the hours of 8:30 a.m. and 5:00 p.m. Voicemails left after 4:30 p.m. will not be returned until the following business day. _______________________________________________________________  For prescription refill requests, have your pharmacy contact our office. _______________________________________________________________  Recommendations made by the consultant and any test results will be sent to your referring physician. _______________________________________________________________ 

## 2018-04-24 NOTE — Progress Notes (Signed)
Diagnosis Iron deficiency anemia due to chronic blood loss - Plan: CBC with Differential, Comprehensive metabolic panel, Lactate dehydrogenase, Ferritin  Staging Cancer Staging No matching staging information was found for the patient.  Assessment and Plan:   1.  Iron deficiency anemia.  Pt had Colonoscopy on 09/13/2015 showing diverticuli in sigmoid colon, descending colon and transverse colon, internal and external hemorrhoids banded.  She had EGD on 09/13/2015 showing normal esophagus, small hiatal hernia.    Labs done 04/15/2018 reviewed and showed WBC 5.1 HB 12.8 plts 218,000.  Chemistries WNL with K+ 4.2 Cr 0.59 and normal LFTs.  Ferritin decreased at 27.  Pt is being treated with Feraheme 510 mg IV D1 and D8.  She will RTC in 05/2018 with repeat labs.  Pt reports blood in stool and is advised to follow-up with GI as directed.    2.  Blood in stools.  HB 12.8 on labs done 04/15/2018.   Pt should follow-up with GI as recommended.    3.  Left breast cysts.  Pt had diagnostic mammogram done 10/30/2017 that showed  IMPRESSION: Benign mildly complicated cysts in the 11:30 position of the left breast account for the low-density circumscribed masses seen on today's diagnostic mammogram. Incidentally noted is an additional 0.3 cm benign cyst in the same region of the left breast. No evidence of malignancy.  She is recommended for repeat imaging in 1 year.    4.  Health maintenance.  GI follow-up and mammograms as directed.    25 minutes spent with more than 50% spent in review of records, counseling and coordination of care.    Current Status:  Pt is seen today for follow-up.  She is here to go over labs and for evaluation prior to IV iron.  She reports some blood in stool.    Problem List Patient Active Problem List   Diagnosis Date Noted  . Obesity (BMI 30-39.9) [E66.9] 08/22/2017  . Other fatigue [R53.83] 07/25/2017  . Shortness of breath on exertion [R06.02] 07/25/2017  . Prediabetes  [R73.03] 07/25/2017  . Vitamin D deficiency [E55.9] 07/25/2017  . B12 nutritional deficiency [E53.8] 07/25/2017  . Heart murmur [R01.1] 05/10/2016  . Diverticulosis of colon without hemorrhage [K57.30]   . IDA (iron deficiency anemia) [D50.9]   . Rectal bleeding [K62.5]   . History of melena [Z87.19]   . History of colonic polyps [Z86.010]   . Third degree hemorrhoids [K64.2]   . Reflux esophagitis [K21.0]   . Hiatal hernia [K44.9]   . Dysphagia, pharyngoesophageal phase [R13.14]   . Adenomatous colon polyp [D12.6] 10/16/2014  . Melena [K92.1] 10/12/2014  . Iron deficiency anemia [D50.9] 06/05/2014  . OSA (obstructive sleep apnea) [G47.33] 01/02/2011  . HEMATOCHEZIA [K92.1] 12/28/2008  . ABDOMINAL PAIN, LEFT LOWER QUADRANT [R10.32] 12/28/2008  . COLONIC POLYPS, ADENOMATOUS, HX OF [Z86.010] 12/28/2008  . GERD [K21.9] 12/25/2008  . ARTHRITIS [M12.9] 12/25/2008  . LOW BACK PAIN, ACUTE [M54.5] 12/25/2008  . Dysphagia [R13.10] 12/25/2008  . PUD, HX OF [Z87.11] 12/25/2008    Past Medical History Past Medical History:  Diagnosis Date  . Adenomatous colon polyp   . Allergic rhinitis   . Arthritis   . Back pain   . Constipation   . Diabetes mellitus   . Diverticulosis   . External hemorrhoids   . Fibromyalgia   . Gallbladder problem   . GERD (gastroesophageal reflux disease)   . Glaucoma   . Glaucoma   . Hemorrhoids    grade 3   .  Hip pain   . IBS (irritable bowel syndrome)   . IDA (iron deficiency anemia)   . Iron deficiency anemia 06/05/2014  . Irregular heartbeat   . Leg edema   . Leg numbness   . Leg pain   . Low tension glaucoma   . Neuropathy, leg   . Ovarian cyst 11/20/00  . Prediabetes   . Sleep apnea    CPAP  . Sliding hiatal hernia     Past Surgical History Past Surgical History:  Procedure Laterality Date  . CARPAL TUNNEL RELEASE Bilateral   . CATARACT EXTRACTION     bilateral  . CHOLECYSTECTOMY    . COLONOSCOPY  2005   Small external hemorrhoids,  single sigmoid colon diverticulum, small polyp at the hepatic flexure focally adenomatous.  . COLONOSCOPY  2010   Dr. Gala Romney, Versed 4/Demerol 75. Pancolonic diverticula, external hemorrhoids. Colonoscopy every 5 years for history of adenomatous polyps  . COLONOSCOPY  2014   Dr. Olevia Perches: Propofol. mild diverticulosis in the sigmoid colon, random colon biopsies benign.  . COLONOSCOPY WITH PROPOFOL N/A 09/13/2015   Procedure: COLONOSCOPY WITH PROPOFOL;  Surgeon: Daneil Dolin, MD;  [facility];  Service: Endoscopy;  Laterality: N/A;  1030  . ESOPHAGEAL DILATION N/A 10/26/2014   Procedure: ESOPHAGEAL DILATION [place];  Surgeon: Daneil Dolin, MD;  Location: [facility];  Service: Endoscopy;  Laterality: N/A;  . ESOPHAGOGASTRODUODENOSCOPY  2010   Dr. Gala Romney, distal esophageal erosions, Venia Minks dilator passed. Comment small hiatal hernia, patulous EG junction.  . ESOPHAGOGASTRODUODENOSCOPY (EGD) WITH PROPOFOL N/A 10/26/2014   Dr.Rourk- distal esophageal erosions c/w mild erosive reflux esophagitis. hiatal hernia, o/w normal EGD  . ESOPHAGOGASTRODUODENOSCOPY (EGD) WITH PROPOFOL N/A 09/13/2015   Procedure: ESOPHAGOGASTRODUODENOSCOPY (EGD) WITH PROPOFOL;  Surgeon: Daneil Dolin, MD;  [facility];  Service: Endoscopy;  Laterality: N/A;  . GIVENS CAPSULE STUDY N/A 10/26/2014   couple of mucosal breaks/erosions but essentially unremarkable small bowel study  . HEMORRHOID BANDING    . KNEE SURGERY     right-arthroscopy  . OOPHORECTOMY  age 42s  . PARTIAL HYSTERECTOMY  age 49s  . RECTAL EXAM UNDER ANESTHESIA N/A 10/26/2014   circumferential grade 3 hemoorrhoids easily reducible  . thumb surger Bilateral    trigger thumb    Family History Family History  Problem Relation Age of Onset  . Ulcers Mother   . Glaucoma Mother   . Heart disease Mother   . Diabetes Mother   . Depression Mother   . Sudden death Mother   . Obesity Mother   . Glaucoma Maternal Grandmother   . Heart  disease Maternal Grandmother   . Heart disease Maternal Grandfather   . Colon cancer Neg Hx      Social History  reports that she has never smoked. She has never used smokeless tobacco. She reports previous alcohol use of about 1.0 standard drinks of alcohol per week. She reports that she does not use drugs.  Medications  Current Outpatient Medications:  .  acetaminophen (TYLENOL) 500 MG tablet, Take 500 mg by mouth every 6 (six) hours as needed., Disp: , Rfl:  .  amitriptyline (ELAVIL) 25 MG tablet, amitriptyline 25 mg tablet, Disp: , Rfl:  .  cetirizine (ZYRTEC) 10 MG tablet, Take 10 mg by mouth daily. , Disp: , Rfl:  .  metFORMIN (GLUCOPHAGE) 500 MG tablet, Take 1 tablet (500 mg total) by mouth daily with breakfast., Disp: 30 tablet, Rfl: 0 .  naproxen sodium (ALEVE)  220 MG tablet, Take 220-440 mg by mouth daily as needed., Disp: , Rfl:  No current facility-administered medications for this visit.   Facility-Administered Medications Ordered in Other Visits:  .  0.9 %  sodium chloride infusion, , Intravenous, Continuous, Lurlean Kernen, MD, Last Rate: 20 mL/hr at 04/24/18 9417  Allergies Injectafer [ferric carboxymaltose]  Review of Systems Review of Systems - Oncology ROS negative other than blood in stools.     Physical Exam  Vitals Wt Readings from Last 3 Encounters:  04/02/18 170 lb (77.1 kg)  03/07/18 174 lb (78.9 kg)  02/05/18 177 lb (80.3 kg)   Temp Readings from Last 3 Encounters:  04/24/18 97.8 F (36.6 C) (Oral)  04/24/18 97.8 F (36.6 C) (Oral)  04/02/18 97.9 F (36.6 C) (Oral)   BP Readings from Last 3 Encounters:  04/24/18 (!) 122/51  04/24/18 (!) 122/51  04/17/18 (!) (P) 134/41   Pulse Readings from Last 3 Encounters:  04/24/18 (!) 54  04/24/18 (!) 54  04/17/18 (!) (P) 51    Constitutional: Well-developed, well-nourished, and in no distress.   HENT: Head: Normocephalic and atraumatic.  Mouth/Throat: No oropharyngeal exudate. Mucosa moist. Eyes:  Pupils are equal, round, and reactive to light. Conjunctivae are normal. No scleral icterus.  Neck: Normal range of motion. Neck supple. No JVD present.  Cardiovascular: Normal rate, regular rhythm and normal heart sounds.  Exam reveals no gallop and no friction rub.   No murmur heard. Pulmonary/Chest: Effort normal and breath sounds normal. No respiratory distress. No wheezes.No rales.  Abdominal: Soft. Bowel sounds are normal. No distension. There is no tenderness. There is no guarding.  Musculoskeletal: No edema or tenderness.  Lymphadenopathy: No cervical, axillary or supraclavicular adenopathy.  Neurological: Alert and oriented to person, place, and time. No cranial nerve deficit.  Skin: Skin is warm and dry. No rash noted. No erythema. No pallor.  Psychiatric: Affect and judgment normal.   Labs No visits with results within 3 Day(s) from this visit.  Latest known visit with results is:  Appointment on 04/15/2018  Component Date Value Ref Range Status  . WBC 04/15/2018 5.1  4.0 - 10.5 K/uL Final  . RBC 04/15/2018 4.67  3.87 - 5.11 MIL/uL Final  . Hemoglobin 04/15/2018 12.8  12.0 - 15.0 g/dL Final  . HCT 04/15/2018 41.5  36.0 - 46.0 % Final  . MCV 04/15/2018 88.9  80.0 - 100.0 fL Final  . MCH 04/15/2018 27.4  26.0 - 34.0 pg Final  . MCHC 04/15/2018 30.8  30.0 - 36.0 g/dL Final  . RDW 04/15/2018 13.4  11.5 - 15.5 % Final  . Platelets 04/15/2018 218  150 - 400 K/uL Final  . nRBC 04/15/2018 0.0  0.0 - 0.2 % Final  . Neutrophils Relative % 04/15/2018 65  % Final  . Neutro Abs 04/15/2018 3.3  1.7 - 7.7 K/uL Final  . Lymphocytes Relative 04/15/2018 25  % Final  . Lymphs Abs 04/15/2018 1.3  0.7 - 4.0 K/uL Final  . Monocytes Relative 04/15/2018 8  % Final  . Monocytes Absolute 04/15/2018 0.4  0.1 - 1.0 K/uL Final  . Eosinophils Relative 04/15/2018 2  % Final  . Eosinophils Absolute 04/15/2018 0.1  0.0 - 0.5 K/uL Final  . Basophils Relative 04/15/2018 0  % Final  . Basophils Absolute  04/15/2018 0.0  0.0 - 0.1 K/uL Final  . Immature Granulocytes 04/15/2018 0  % Final  . Abs Immature Granulocytes 04/15/2018 0.01  0.00 - 0.07 K/uL Final  Performed at [facility], [address], [place], [place] [address]  . Sodium 04/15/2018 140  135 - 145 mmol/L Final  . Potassium 04/15/2018 4.2  3.5 - 5.1 mmol/L Final  . Chloride 04/15/2018 103  98 - 111 mmol/L Final  . CO2 04/15/2018 29  22 - 32 mmol/L Final  . Glucose, Bld 04/15/2018 109* 70 - 99 mg/dL Final  . BUN 04/15/2018 22  8 - 23 mg/dL Final  . Creatinine, Ser 04/15/2018 0.59  0.44 - 1.00 mg/dL Final  . Calcium 04/15/2018 9.5  8.9 - 10.3 mg/dL Final  . Total Protein 04/15/2018 7.2  6.5 - 8.1 g/dL Final  . Albumin 04/15/2018 3.9  3.5 - 5.0 g/dL Final  . AST 04/15/2018 19  15 - 41 U/L Final  . ALT 04/15/2018 21  0 - 44 U/L Final  . Alkaline Phosphatase 04/15/2018 44  38 - 126 U/L Final  . Total Bilirubin 04/15/2018 0.7  0.3 - 1.2 mg/dL Final  . GFR calc non Af Amer 04/15/2018 >60  >60 mL/min Final  . GFR calc Af Amer 04/15/2018 >60  >60 mL/min Final  . Anion gap 04/15/2018 8  5 - 15 Final   Performed at [facility], [address] [place], [place] [address]  . LDH 04/15/2018 121  98 - 192 U/L Final   Performed at [facility], [address] [place], [place] [address]  . Ferritin 04/15/2018 27  11 - 307 ng/mL Final   Performed at [facility], [address], [place], [place] [address]     Pathology Orders Placed This Encounter  Procedures  . CBC with Differential    Standing Status:   Future    Standing Expiration Date:   04/25/2019  . Comprehensive metabolic panel    Standing Status:   Future    Standing Expiration Date:   04/25/2019  . Lactate dehydrogenase    Standing Status:   Future    Standing Expiration Date:   04/25/2019  . Ferritin    Standing Status:   Future    Standing Expiration Date:   04/25/2019       Zoila Shutter MD

## 2018-04-30 ENCOUNTER — Ambulatory Visit (INDEPENDENT_AMBULATORY_CARE_PROVIDER_SITE_OTHER): Payer: Medicare Other | Admitting: Family Medicine

## 2018-04-30 ENCOUNTER — Encounter (INDEPENDENT_AMBULATORY_CARE_PROVIDER_SITE_OTHER): Payer: Self-pay | Admitting: Family Medicine

## 2018-04-30 VITALS — BP 103/64 | HR 59 | Ht 64.0 in | Wt 169.0 lb

## 2018-04-30 DIAGNOSIS — E669 Obesity, unspecified: Secondary | ICD-10-CM | POA: Diagnosis not present

## 2018-04-30 DIAGNOSIS — Z6830 Body mass index (BMI) 30.0-30.9, adult: Secondary | ICD-10-CM

## 2018-04-30 DIAGNOSIS — E119 Type 2 diabetes mellitus without complications: Secondary | ICD-10-CM

## 2018-04-30 DIAGNOSIS — K5909 Other constipation: Secondary | ICD-10-CM | POA: Diagnosis not present

## 2018-04-30 MED ORDER — METFORMIN HCL 500 MG PO TABS: 500.0000 mg | ORAL_TABLET | Freq: Every day | ORAL | 1 refills | Status: DC

## 2018-04-30 NOTE — Progress Notes (Signed)
Office: [phone]  /  Fax: [phone]   HPI:   Chief Complaint: OBESITY Nishat is here to discuss her progress with her obesity treatment plan. She is on the Category 1 plan and is following her eating plan approximately 96 % of the time. She states she is exercising 15 to 20 minutes 3 times per week. Bryanah continues to lose weight and her BMI is now under 30. Hunger is controlled. Nkechi tried to journal, but she didn't do well with the journaling app. Her weight is 169 lb (76.7 kg) today and has had a weight loss of 1 pound over a period of 4 weeks since her last visit. She has lost 39 lbs since starting treatment with [name].  Diabetes II Misk has a diagnosis of diabetes type II. Zriyah is on metformin currently. She is doing well on her diet prescription and she denies any hypoglycemic episodes. Last A1c was very controlled at 5.7 She has been working on intensive lifestyle modifications including diet, exercise, and weight loss to help control her blood glucose levels.  Constipation Donabelle is on Miralax daily and she has increased her fiber and water in her diet, but she still feels constipated. She denies rectal bleeding.   ASSESSMENT AND PLAN:  Type 2 diabetes mellitus without complication, without long-term current use of insulin ([place]) - Plan: metFORMIN (GLUCOPHAGE) 500 MG tablet  Other constipation  Class 1 obesity with serious comorbidity and body mass index (BMI) of 30.0 to 30.9 in adult, unspecified obesity type - starting BMI greater then 30  PLAN:  Diabetes II Larri has been given extensive diabetes education by myself today including ideal fasting and post-prandial blood glucose readings, individual ideal Hgb A1c goals and hypoglycemia prevention. We discussed the importance of good blood sugar control to decrease the likelihood of diabetic complications such as nephropathy, neuropathy, limb loss, blindness, coronary artery disease, and death. We discussed the  importance of intensive lifestyle modification including diet, exercise and weight loss as the first line treatment for diabetes. Landyn agrees to continue metformin 500 mg daily with breakfast #30 with 1 refill and follow up at the agreed upon time.  Constipation Delayni was informed decrease bowel movement frequency is normal while losing weight, but stools should not be hard or painful. She was advised to continue increasing her fiber and water intake. Yaneliz agrees to increase OTC Miralax to 17 grams BID and follow up as directed.  Obesity Emrys is currently in the action stage of change. As such, her goal is to continue with weight loss efforts She has agreed to follow the Category 1 plan Bertha has been instructed to work up to a goal of 150 minutes of combined cardio and strengthening exercise per week for weight loss and overall health benefits. We discussed the following Behavioral Modification Strategies today: increase H2O intake, increasing lean protein intake, decreasing simple carbohydrates, increasing fiber rich foods, work on meal planning and easy cooking plans and celebration eating strategies   Comfort has agreed to follow up with our clinic in 6 weeks and she is to see Hoyle Sauer our registered dietician in 3 weeks to review journaling on the app and per plan. She was informed of the importance of frequent follow up visits to maximize her success with intensive lifestyle modifications for her multiple health conditions.  ALLERGIES: Allergies  Allergen Reactions   Injectafer [Ferric Carboxymaltose]     Has tolerated feraheme without problems    MEDICATIONS: Current Outpatient Medications on File Prior to  Visit  Medication Sig Dispense Refill   acetaminophen (TYLENOL) 500 MG tablet Take 500 mg by mouth every 6 (six) hours as needed.     amitriptyline (ELAVIL) 25 MG tablet amitriptyline 25 mg tablet     cetirizine (ZYRTEC) 10 MG tablet Take 10 mg by mouth daily.       naproxen sodium (ALEVE) 220 MG tablet Take 220-440 mg by mouth daily as needed.     No current facility-administered medications on file prior to visit.     PAST MEDICAL HISTORY: Past Medical History:  Diagnosis Date   Adenomatous colon polyp    Allergic rhinitis    Arthritis    Back pain    Constipation    Diabetes mellitus    Diverticulosis    External hemorrhoids    Fibromyalgia    Gallbladder problem    GERD (gastroesophageal reflux disease)    Glaucoma    Glaucoma    Hemorrhoids    grade 3    Hip pain    IBS (irritable bowel syndrome)    IDA (iron deficiency anemia)    Iron deficiency anemia 06/05/2014   Irregular heartbeat    Leg edema    Leg numbness    Leg pain    Low tension glaucoma    Neuropathy, leg    Ovarian cyst 11/20/00   Prediabetes    Sleep apnea    CPAP   Sliding hiatal hernia     PAST SURGICAL HISTORY: Past Surgical History:  Procedure Laterality Date   CARPAL TUNNEL RELEASE Bilateral    CATARACT EXTRACTION     bilateral   CHOLECYSTECTOMY     COLONOSCOPY  2005   Small external hemorrhoids, single sigmoid colon diverticulum, small polyp at the hepatic flexure focally adenomatous.   COLONOSCOPY  2010   Dr. Gala Romney, Versed 4/Demerol 75. Pancolonic diverticula, external hemorrhoids. Colonoscopy every 5 years for history of adenomatous polyps   COLONOSCOPY  2014   Dr. Olevia Perches: Propofol. mild diverticulosis in the sigmoid colon, random colon biopsies benign.   COLONOSCOPY WITH PROPOFOL N/A 09/13/2015   Procedure: COLONOSCOPY WITH PROPOFOL;  Surgeon: Daneil Dolin, MD;  [facility];  Service: Endoscopy;  Laterality: N/A;  1030   ESOPHAGEAL DILATION N/A 10/26/2014   Procedure: ESOPHAGEAL DILATION [place];  Surgeon: Daneil Dolin, MD;  Location: [facility];  Service: Endoscopy;  Laterality: N/A;   ESOPHAGOGASTRODUODENOSCOPY  2010   Dr. Gala Romney, distal esophageal erosions, Venia Minks dilator passed. Comment small  hiatal hernia, patulous EG junction.   ESOPHAGOGASTRODUODENOSCOPY (EGD) WITH PROPOFOL N/A 10/26/2014   Dr.Rourk- distal esophageal erosions c/w mild erosive reflux esophagitis. hiatal hernia, o/w normal EGD   ESOPHAGOGASTRODUODENOSCOPY (EGD) WITH PROPOFOL N/A 09/13/2015   Procedure: ESOPHAGOGASTRODUODENOSCOPY (EGD) WITH PROPOFOL;  Surgeon: Daneil Dolin, MD;  [facility];  Service: Endoscopy;  Laterality: N/A;   GIVENS CAPSULE STUDY N/A 10/26/2014   couple of mucosal breaks/erosions but essentially unremarkable small bowel study   HEMORRHOID BANDING     KNEE SURGERY     right-arthroscopy   OOPHORECTOMY  age 8s   PARTIAL HYSTERECTOMY  age 84s   RECTAL EXAM UNDER ANESTHESIA N/A 10/26/2014   circumferential grade 3 hemoorrhoids easily reducible   thumb surger Bilateral    trigger thumb    SOCIAL HISTORY: Social History   Tobacco Use   Smoking status: Never Smoker   Smokeless tobacco: Never Used   Tobacco comment: Pt reports tried once in 7th grade//ms  Substance Use Topics  Alcohol use: Not Currently    Alcohol/week: 1.0 standard drinks    Types: 1 Glasses of wine per week    Comment: rare   Drug use: No    FAMILY HISTORY: Family History  Problem Relation Age of Onset   Ulcers Mother    Glaucoma Mother    Heart disease Mother    Diabetes Mother    Depression Mother    Sudden death Mother    Obesity Mother    Glaucoma Maternal Grandmother    Heart disease Maternal Grandmother    Heart disease Maternal Grandfather    Colon cancer Neg Hx     ROS: Review of Systems  Constitutional: Positive for weight loss.  Gastrointestinal: Positive for constipation.       Negative for rectal bleeding  Endo/Heme/Allergies:       Negative for hypoglycemia    PHYSICAL EXAM: Blood pressure 103/64, pulse (!) 59, height 5\' 4"  (1.626 m), weight 169 lb (76.7 kg), SpO2 99 %. Body mass index is 29.01 kg/m. Physical Exam Vitals signs reviewed.    Constitutional:      Appearance: Normal appearance. She is well-developed. She is obese.  Cardiovascular:     Rate and Rhythm: Normal rate.  Pulmonary:     Effort: Pulmonary effort is normal.  Musculoskeletal: Normal range of motion.  Skin:    General: Skin is warm and dry.  Neurological:     Mental Status: She is alert and oriented to person, place, and time.  Psychiatric:        Mood and Affect: Mood normal.        Behavior: Behavior normal.     RECENT LABS AND TESTS: BMET    Component Value Date/Time   NA 140 04/15/2018 1002   NA 144 03/19/2018   K 4.2 04/15/2018 1002   CL 103 04/15/2018 1002   CO2 29 04/15/2018 1002   GLUCOSE 109 (H) 04/15/2018 1002   BUN 22 04/15/2018 1002   BUN 24 (A) 03/19/2018   CREATININE 0.59 04/15/2018 1002   CALCIUM 9.5 04/15/2018 1002   GFRNONAA >60 04/15/2018 1002   GFRAA >60 04/15/2018 1002   Lab Results  Component Value Date   HGBA1C 5.7 03/19/2018   HGBA1C 5.6 01/08/2018   HGBA1C 6.0 11/16/2017   HGBA1C 5.5 07/09/2017   Lab Results  Component Value Date   INSULIN 10.4 01/08/2018   INSULIN 21.9 07/25/2017   CBC    Component Value Date/Time   WBC 5.1 04/15/2018 1002   RBC 4.67 04/15/2018 1002   HGB 12.8 04/15/2018 1002   HCT 41.5 04/15/2018 1002   PLT 218 04/15/2018 1002   MCV 88.9 04/15/2018 1002   MCH 27.4 04/15/2018 1002   MCHC 30.8 04/15/2018 1002   RDW 13.4 04/15/2018 1002   LYMPHSABS 1.3 04/15/2018 1002   MONOABS 0.4 04/15/2018 1002   EOSABS 0.1 04/15/2018 1002   BASOSABS 0.0 04/15/2018 1002   Iron/TIBC/Ferritin/ %Sat    Component Value Date/Time   IRON 76 10/08/2017 1050   TIBC 389 10/08/2017 1050   FERRITIN 27 04/15/2018 1002   IRONPCTSAT 20 10/08/2017 1050   Lipid Panel     Component Value Date/Time   CHOL 162 03/19/2018   CHOL 179 01/08/2018 1555   TRIG 70 03/19/2018   HDL 46 03/19/2018   HDL 42 01/08/2018 1555   LDLCALC 102 03/19/2018   LDLCALC 121 (H) 01/08/2018 1555   Hepatic Function  Panel     Component Value Date/Time  PROT 7.2 04/15/2018 1002   PROT 7.0 01/08/2018 1555   ALBUMIN 3.9 04/15/2018 1002   ALBUMIN 4.2 01/08/2018 1555   AST 19 04/15/2018 1002   ALT 21 04/15/2018 1002   ALKPHOS 44 04/15/2018 1002   BILITOT 0.7 04/15/2018 1002   BILITOT 0.8 01/08/2018 1555      Component Value Date/Time   TSH 0.718 07/25/2017 1239   TSH 0.71 10/22/2012 1030   Results for ARRYANNA, HOLQUIN "SANDY" (MRN [id number]) as of 04/30/2018 16:26  Ref. Range 01/08/2018 15:55  Vitamin D, 25-Hydroxy Latest Ref Range: 30.0 - 100.0 ng/mL 37.7     OBESITY BEHAVIORAL INTERVENTION VISIT  Today's visit was # 13   Starting weight: 208 lbs Starting date: 07/25/2017 Today's weight : 169 lbs Today's date: 04/30/2018 Total lbs lost to date: 39 At least 15 minutes were spent on discussing the following behavioral intervention visit.    04/30/2018  Height 5\' 4"  (1.626 m)  Weight 169 lb (76.7 kg)  BMI (Calculated) 28.99  BLOOD PRESSURE - SYSTOLIC 449  BLOOD PRESSURE - DIASTOLIC 64   Body Fat % 67.5 %  Total Body Water (lbs) 73.8 lbs    ASK: We discussed the diagnosis of obesity with Garnette Gunner today and Katharine Look agreed to give [name] permission to discuss obesity behavioral modification therapy today.  ASSESS: Adie has the diagnosis of obesity and her BMI today is 28.99 Shakayla is in the action stage of change   ADVISE: Pete was educated on the multiple health risks of obesity as well as the benefit of weight loss to improve her health. She was advised of the need for long term treatment and the importance of lifestyle modifications to improve her current health and to decrease her risk of future health problems.  AGREE: Multiple dietary modification options and treatment options were discussed and  Shaquan agreed to follow the recommendations documented in the above note.  ARRANGE: Lillybeth was educated on the importance of frequent visits to treat obesity as outlined per CMS  and USPSTF guidelines and agreed to schedule her next follow up appointment today.  Corey Skains, am acting as transcriptionist for Dennard Nip, MD

## 2018-05-01 NOTE — Progress Notes (Signed)
Office: [phone]  /  Fax: [phone]   HPI:   Chief Complaint: OBESITY Debra Harris is here to discuss her progress with her obesity treatment plan. She is on the Category 1 plan and is following her eating plan approximately 96% of the time. She states she is doing floor exercises and riding a bike 15-20 minutes 3 times per week. Debra Harris continues to lose weight and her BMI is now under 30. She reports her hunger is controlled. She states she tried to journal but didn't do well with the journaling app. Her weight is 169 lb (76.7 kg) today and has had a weight loss of 1 pound over a period of 4 weeks since her last visit. She has lost 39 lbs since starting treatment with [name].  Diabetes II Debra Harris has a diagnosis of diabetes type II. Debra Harris does not report checking her blood sugars. She denies any hypoglycemic episodes. Her last A1c was very controlled at  5.7 on metformin. She is doing very well on her diet prescription and has been working on intensive lifestyle modifications including diet, exercise, and weight loss to help control her blood glucose levels.  Constipation Debra Harris states she is on MiraLax daily and increased her fiber and H20 in her diet but still feels constipated. She denies rectal bleeding.   ASSESSMENT AND PLAN:  Type 2 diabetes mellitus without complication, without long-term current use of insulin (Debra Harris) - Plan: metFORMIN (GLUCOPHAGE) 500 MG tablet  Other constipation  Class 1 obesity with serious comorbidity and body mass index (BMI) of 30.0 to 30.9 in adult, unspecified obesity type - starting BMI greater then 30  PLAN:  Diabetes II Debra Harris has been given extensive diabetes education by myself today including ideal fasting and post-prandial blood glucose readings, individual ideal Hgb A1c goals  and hypoglycemia prevention. We discussed the importance of good blood sugar control to decrease the likelihood of diabetic complications such as nephropathy, neuropathy,  limb loss, blindness, coronary artery disease, and death. We discussed the importance of intensive lifestyle modification including diet, exercise and weight loss as the first line treatment for diabetes. [name] was given a refill on her metformin x2 months and will follow-up at the agreed upon time.  Constipation Debra Harris was advised to increase her MiraLax OTC to 17 grams BID and to increase her fiber and H20 intake.   Obesity Debra Harris is currently in the action stage of change. As such, her goal is to continue with weight loss efforts. She has agreed to follow the Category 1 plan. Debra Harris has been instructed to increase her strengthening exercises. Handout was given. We discussed the following Behavioral Modification Strategies today: increasing lean protein intake, decreasing simple carbohydrates, increase H20 intake, increasing fiber rich foods, work on meal planning and easy cooking plans, and celebration eating strategies. [name] will see Thelma Comp, our Registered Dietitian, in 3 weeks to review journaling on the app and on paper.  [name] has agreed to follow-up with our clinic in 6 weeks. She was informed of the importance of frequent follow up visits to maximize her success with intensive lifestyle modifications for her multiple health conditions.  ALLERGIES: Allergies  Allergen Reactions   Injectafer [Ferric Carboxymaltose]     Has tolerated feraheme without problems    MEDICATIONS: Current Outpatient Medications on File Prior to Visit  Medication Sig Dispense Refill   acetaminophen (TYLENOL) 500 MG tablet Take 500 mg by mouth every 6 (six) hours as needed.     amitriptyline (ELAVIL) 25 MG tablet amitriptyline  25 mg tablet     cetirizine (ZYRTEC) 10 MG tablet Take 10 mg by mouth daily.      naproxen sodium (ALEVE) 220 MG tablet Take 220-440 mg by mouth daily as needed.     No current facility-administered medications on file prior to visit.    PAST MEDICAL  HISTORY: Past Medical History:  Diagnosis Date   Adenomatous colon polyp    Allergic rhinitis    Arthritis    Back pain    Constipation    Diabetes mellitus    Diverticulosis    External hemorrhoids    Fibromyalgia    Gallbladder problem    GERD (gastroesophageal reflux disease)    Glaucoma    Glaucoma    Hemorrhoids    grade 3    Hip pain    IBS (irritable bowel syndrome)    IDA (iron deficiency anemia)    Iron deficiency anemia 06/05/2014   Irregular heartbeat    Leg edema    Leg numbness    Leg pain    Low tension glaucoma    Neuropathy, leg    Ovarian cyst 11/20/00   Prediabetes    Sleep apnea    CPAP   Sliding hiatal hernia    PAST SURGICAL HISTORY: Past Surgical History:  Procedure Laterality Date   CARPAL TUNNEL RELEASE Bilateral    CATARACT EXTRACTION     bilateral   CHOLECYSTECTOMY     COLONOSCOPY  2005   Small external hemorrhoids, single sigmoid colon diverticulum, small polyp at the hepatic flexure focally adenomatous.   COLONOSCOPY  2010   Dr. Gala Romney, Versed 4/Demerol 75. Pancolonic diverticula, external hemorrhoids. Colonoscopy every 5 years for history of adenomatous polyps   COLONOSCOPY  2014   Dr. Olevia Perches: Propofol. mild diverticulosis in the sigmoid colon, random colon biopsies benign.   COLONOSCOPY WITH PROPOFOL N/A 09/13/2015   Procedure: COLONOSCOPY WITH PROPOFOL;  Surgeon: Daneil Dolin, MD;  [facility]: [facility];  Service: Endoscopy;  Laterality: N/A;  1030   ESOPHAGEAL DILATION N/A 10/26/2014   Procedure: ESOPHAGEAL DILATION [place];  Surgeon: Daneil Dolin, MD;  Location: [facility];  Service: Endoscopy;  Laterality: N/A;   ESOPHAGOGASTRODUODENOSCOPY  2010   Dr. Gala Romney, distal esophageal erosions, Venia Minks dilator passed. Comment small hiatal hernia, patulous EG junction.   ESOPHAGOGASTRODUODENOSCOPY (EGD) WITH PROPOFOL N/A 10/26/2014   Dr.Rourk- distal esophageal erosions c/w mild erosive reflux  esophagitis. hiatal hernia, o/w normal EGD   ESOPHAGOGASTRODUODENOSCOPY (EGD) WITH PROPOFOL N/A 09/13/2015   Procedure: ESOPHAGOGASTRODUODENOSCOPY (EGD) WITH PROPOFOL;  Surgeon: Daneil Dolin, MD;  [facility];  Service: Endoscopy;  Laterality: N/A;   GIVENS CAPSULE STUDY N/A 10/26/2014   couple of mucosal breaks/erosions but essentially unremarkable small bowel study   HEMORRHOID BANDING     KNEE SURGERY     right-arthroscopy   OOPHORECTOMY  age 42s   PARTIAL HYSTERECTOMY  age 83s   RECTAL EXAM UNDER ANESTHESIA N/A 10/26/2014   circumferential grade 3 hemoorrhoids easily reducible   thumb surger Bilateral    trigger thumb   SOCIAL HISTORY: Social History   Tobacco Use   Smoking status: Never Smoker   Smokeless tobacco: Never Used   Tobacco comment: Pt reports tried once in 7th grade//ms  Substance Use Topics   Alcohol use: Not Currently    Alcohol/week: 1.0 standard drinks    Types: 1 Glasses of wine per week    Comment: rare   Drug use: No   FAMILY HISTORY:  Family History  Problem Relation Age of Onset   Ulcers Mother    Glaucoma Mother    Heart disease Mother    Diabetes Mother    Depression Mother    Sudden death Mother    Obesity Mother    Glaucoma Maternal Grandmother    Heart disease Maternal Grandmother    Heart disease Maternal Grandfather    Colon cancer Neg Hx    ROS: Review of Systems  Constitutional: Positive for weight loss.  Gastrointestinal: Positive for constipation. Negative for blood in stool.   PHYSICAL EXAM: Blood pressure 103/64, pulse (!) 59, height 5\' 4"  (1.626 m), weight 169 lb (76.7 kg), SpO2 99 %. Body mass index is 29.01 kg/m. Physical Exam Vitals signs reviewed.  Constitutional:      Appearance: Normal appearance. She is obese.  Cardiovascular:     Rate and Rhythm: Normal rate.     Pulses: Normal pulses.  Pulmonary:     Effort: Pulmonary effort is normal.     Breath sounds: Normal breath  sounds.  Musculoskeletal: Normal range of motion.  Skin:    General: Skin is warm and dry.  Neurological:     Mental Status: She is alert and oriented to person, place, and time.  Psychiatric:        Behavior: Behavior normal.   RECENT LABS AND TESTS: BMET    Component Value Date/Time   NA 140 04/15/2018 1002   NA 144 03/19/2018   K 4.2 04/15/2018 1002   CL 103 04/15/2018 1002   CO2 29 04/15/2018 1002   GLUCOSE 109 (H) 04/15/2018 1002   BUN 22 04/15/2018 1002   BUN 24 (A) 03/19/2018   CREATININE 0.59 04/15/2018 1002   CALCIUM 9.5 04/15/2018 1002   GFRNONAA >60 04/15/2018 1002   GFRAA >60 04/15/2018 1002   Lab Results  Component Value Date   HGBA1C 5.7 03/19/2018   HGBA1C 5.6 01/08/2018   HGBA1C 6.0 11/16/2017   HGBA1C 5.5 07/09/2017   Lab Results  Component Value Date   INSULIN 10.4 01/08/2018   INSULIN 21.9 07/25/2017   CBC    Component Value Date/Time   WBC 5.1 04/15/2018 1002   RBC 4.67 04/15/2018 1002   HGB 12.8 04/15/2018 1002   HCT 41.5 04/15/2018 1002   PLT 218 04/15/2018 1002   MCV 88.9 04/15/2018 1002   MCH 27.4 04/15/2018 1002   MCHC 30.8 04/15/2018 1002   RDW 13.4 04/15/2018 1002   LYMPHSABS 1.3 04/15/2018 1002   MONOABS 0.4 04/15/2018 1002   EOSABS 0.1 04/15/2018 1002   BASOSABS 0.0 04/15/2018 1002   Iron/TIBC/Ferritin/ %Sat    Component Value Date/Time   IRON 76 10/08/2017 1050   TIBC 389 10/08/2017 1050   FERRITIN 27 04/15/2018 1002   IRONPCTSAT 20 10/08/2017 1050   Lipid Panel     Component Value Date/Time   CHOL 162 03/19/2018   CHOL 179 01/08/2018 1555   TRIG 70 03/19/2018   HDL 46 03/19/2018   HDL 42 01/08/2018 1555   LDLCALC 102 03/19/2018   LDLCALC 121 (H) 01/08/2018 1555   Hepatic Function Panel     Component Value Date/Time   PROT 7.2 04/15/2018 1002   PROT 7.0 01/08/2018 1555   ALBUMIN 3.9 04/15/2018 1002   ALBUMIN 4.2 01/08/2018 1555   AST 19 04/15/2018 1002   ALT 21 04/15/2018 1002   ALKPHOS 44 04/15/2018 1002    BILITOT 0.7 04/15/2018 1002   BILITOT 0.8 01/08/2018 1555  Component Value Date/Time   TSH 0.718 07/25/2017 1239   TSH 0.71 10/22/2012 1030    Ref. Range 01/08/2018 15:55  Vitamin D, 25-Hydroxy Latest Ref Range: 30.0 - 100.0 ng/mL 37.7   OBESITY BEHAVIORAL INTERVENTION VISIT  Today's visit was #13  Starting weight: 208 lbs Starting date: 07/25/2017 Today's weight: 169 lbs  Today's date: 04/30/2018 Total lbs lost to date: 39 At least 15 minutes were spent on discussing the following behavioral intervention visit.    04/30/2018  Height 5\' 4"  (1.626 m)  Weight 169 lb (76.7 kg)  BMI (Calculated) 28.99  BLOOD PRESSURE - SYSTOLIC 334  BLOOD PRESSURE - DIASTOLIC 64   Body Fat % 35.6 %  Total Body Water (lbs) 73.8 lbs   ASK: We discussed the diagnosis of obesity with Garnette Gunner today and Katharine Look agreed to give [name] permission to discuss obesity behavioral modification therapy today.  ASSESS: Andriana has the diagnosis of obesity and her BMI today is 28.99. Yoko is in the action stage of change.   ADVISE: Trenita was educated on the multiple health risks of obesity as well as the benefit of weight loss to improve her health. She was advised of the need for long term treatment and the importance of lifestyle modifications to improve her current health and to decrease her risk of future health problems.  AGREE: Multiple dietary modification options and treatment options were discussed and  Braxton agreed to follow the recommendations documented in the above note.  ARRANGE: Tata was educated on the importance of frequent visits to treat obesity as outlined per CMS and USPSTF guidelines and agreed to schedule her next follow up appointment today.  I, Michaelene Song, am acting as Location manager for Dennard Nip, MD   I have reviewed the above documentation for accuracy and completeness, and I agree with the above. -Dennard Nip, MD

## 2018-05-23 ENCOUNTER — Encounter (INDEPENDENT_AMBULATORY_CARE_PROVIDER_SITE_OTHER): Payer: Self-pay

## 2018-05-27 ENCOUNTER — Ambulatory Visit (INDEPENDENT_AMBULATORY_CARE_PROVIDER_SITE_OTHER): Payer: Medicare Other | Admitting: Dietician

## 2018-06-11 ENCOUNTER — Other Ambulatory Visit: Payer: Self-pay

## 2018-06-11 ENCOUNTER — Encounter (INDEPENDENT_AMBULATORY_CARE_PROVIDER_SITE_OTHER): Payer: Self-pay | Admitting: Family Medicine

## 2018-06-11 ENCOUNTER — Ambulatory Visit (INDEPENDENT_AMBULATORY_CARE_PROVIDER_SITE_OTHER): Payer: Medicare Other | Admitting: Family Medicine

## 2018-06-11 DIAGNOSIS — J3089 Other allergic rhinitis: Secondary | ICD-10-CM | POA: Diagnosis not present

## 2018-06-11 DIAGNOSIS — Z6830 Body mass index (BMI) 30.0-30.9, adult: Secondary | ICD-10-CM

## 2018-06-11 DIAGNOSIS — E669 Obesity, unspecified: Secondary | ICD-10-CM | POA: Diagnosis not present

## 2018-06-11 DIAGNOSIS — E119 Type 2 diabetes mellitus without complications: Secondary | ICD-10-CM | POA: Diagnosis not present

## 2018-06-11 DIAGNOSIS — E66811 Obesity, class 1: Secondary | ICD-10-CM

## 2018-06-11 MED ORDER — METFORMIN HCL 500 MG PO TABS: 500.0000 mg | ORAL_TABLET | Freq: Every day | ORAL | 0 refills | Status: DC

## 2018-06-11 NOTE — Progress Notes (Signed)
Office: [phone]  /  Fax: [phone] TeleHealth Visit:  Debra Harris has verbally consented to this TeleHealth visit today. The patient is located at home, the provider is located at the [facility] office. The participants in this visit include the listed provider and patient. The visit was conducted today via Face Time.  HPI:   Chief Complaint: OBESITY Debra Harris is here to discuss her progress with her obesity treatment plan. She is journaling calories and following the Category 1 plan and is following her eating plan approximately 85 to 90 % of the time. She states she is doing yard work. Debra Harris states that she is doing well with maintaining her weight. She is doing a lot of very physical activity in her yard like laying a long brick sidewalk and relandscaping various areas. She struggles to journal with her app and is mostly portion control and smarter choices.  We were unable to weigh the patient today for this TeleHealth visit. She feels as if she has maintained weight since her last visit. She has lost 39 lbs since starting treatment with [name].  Diabetes II Ameyah has a diagnosis of diabetes type II. Chassity is doing well on metformin and has increased exercise. Her last A1c was 5.7 on 03/19/18. Debra Harris is still struggling with her diet, but is still being mindful. She has been working on intensive lifestyle modifications including diet, exercise, and weight loss to help control her blood glucose levels.  Allergic Rhinitis Larkyn is stable on Zyrtec even with increased outdoor activity. She denies urinary retention.  ASSESSMENT AND PLAN:  Type 2 diabetes mellitus without complication, without long-term current use of insulin ([place]) - Plan: metFORMIN (GLUCOPHAGE) 500 MG tablet  Allergic rhinitis due to other allergic trigger, unspecified seasonality  Class 1 obesity with serious comorbidity and body mass index (BMI) of 30.0 to 30.9 in adult, unspecified obesity type -  Starting BMI greater then  30  PLAN:  Diabetes II Debra Harris has been given extensive diabetes education by myself today including ideal fasting and post-prandial blood glucose readings, individual ideal Hgb A1c goals, and hypoglycemia prevention. We discussed the importance of good blood sugar control to decrease the likelihood of diabetic complications such as nephropathy, neuropathy, limb loss, blindness, coronary artery disease, and death. We discussed the importance of intensive lifestyle modification including diet, exercise, and weight loss as the first line treatment for diabetes. Debra Harris agrees to continue her metformin 500 mg qAM #30 with no refills and will follow up at the agreed upon time.   Allergic Rhinitis Debra Harris agrees to continue OTC Zyrtec with a goal to change to Flonase next allergy season due to Debra Harris's age. She will follow up as agrees in 4 weeks.  Obesity Debra Harris is currently in the action stage of change. As such, her goal is to continue with weight loss efforts. She has agreed to keep a food journal with 1200 calories and 70+ grams of protein or follow the Category 1 plan. Debra Harris has been instructed to work up to a goal of 150 minutes of combined cardio and strengthening exercise per week for weight loss and overall health benefits. We discussed the following Behavioral Modification Strategies today: increasing lean protein intake, keeping healthy foods in the home, better snacking choices, and ways to avoid boredom eating.  Debra Harris has agreed to follow up with our clinic in 4 weeks. She was informed of the importance of frequent follow up visits to maximize her success with intensive lifestyle modifications for  her multiple health conditions.  ALLERGIES: Allergies  Allergen Reactions  . Injectafer [Ferric Carboxymaltose]     Has tolerated feraheme without problems    MEDICATIONS: Current Outpatient Medications on File Prior to Visit  Medication Sig Dispense Refill  .  acetaminophen (TYLENOL) 500 MG tablet Take 500 mg by mouth every 6 (six) hours as needed.    Marland Kitchen amitriptyline (ELAVIL) 25 MG tablet amitriptyline 25 mg tablet    . cetirizine (ZYRTEC) 10 MG tablet Take 10 mg by mouth daily.     . naproxen sodium (ALEVE) 220 MG tablet Take 220-440 mg by mouth daily as needed.     No current facility-administered medications on file prior to visit.     PAST MEDICAL HISTORY: Past Medical History:  Diagnosis Date  . Adenomatous colon polyp   . Allergic rhinitis   . Arthritis   . Back pain   . Constipation   . Diabetes mellitus   . Diverticulosis   . External hemorrhoids   . Fibromyalgia   . Gallbladder problem   . GERD (gastroesophageal reflux disease)   . Glaucoma   . Glaucoma   . Hemorrhoids    grade 3   . Hip pain   . IBS (irritable bowel syndrome)   . IDA (iron deficiency anemia)   . Iron deficiency anemia 06/05/2014  . Irregular heartbeat   . Leg edema   . Leg numbness   . Leg pain   . Low tension glaucoma   . Neuropathy, leg   . Ovarian cyst 11/20/00  . Prediabetes   . Sleep apnea    CPAP  . Sliding hiatal hernia     PAST SURGICAL HISTORY: Past Surgical History:  Procedure Laterality Date  . CARPAL TUNNEL RELEASE Bilateral   . CATARACT EXTRACTION     bilateral  . CHOLECYSTECTOMY    . COLONOSCOPY  2005   Small external hemorrhoids, single sigmoid colon diverticulum, small polyp at the hepatic flexure focally adenomatous.  . COLONOSCOPY  2010   Dr. Gala Romney, Versed 4/Demerol 75. Pancolonic diverticula, external hemorrhoids. Colonoscopy every 5 years for history of adenomatous polyps  . COLONOSCOPY  2014   Dr. Olevia Perches: Propofol. mild diverticulosis in the sigmoid colon, random colon biopsies benign.  . COLONOSCOPY WITH PROPOFOL N/A 09/13/2015   Procedure: COLONOSCOPY WITH PROPOFOL;  Surgeon: Daneil Dolin, MD;  [facility];  Service: Endoscopy;  Laterality: N/A;  1030  . ESOPHAGEAL DILATION N/A 10/26/2014   Procedure:  ESOPHAGEAL DILATION [place];  Surgeon: Daneil Dolin, MD;  Location: [facility];  Service: Endoscopy;  Laterality: N/A;  . ESOPHAGOGASTRODUODENOSCOPY  2010   Dr. Gala Romney, distal esophageal erosions, Venia Minks dilator passed. Comment small hiatal hernia, patulous EG junction.  . ESOPHAGOGASTRODUODENOSCOPY (EGD) WITH PROPOFOL N/A 10/26/2014   Dr.Rourk- distal esophageal erosions c/w mild erosive reflux esophagitis. hiatal hernia, o/w normal EGD  . ESOPHAGOGASTRODUODENOSCOPY (EGD) WITH PROPOFOL N/A 09/13/2015   Procedure: ESOPHAGOGASTRODUODENOSCOPY (EGD) WITH PROPOFOL;  Surgeon: Daneil Dolin, MD;  [facility];  Service: Endoscopy;  Laterality: N/A;  . GIVENS CAPSULE STUDY N/A 10/26/2014   couple of mucosal breaks/erosions but essentially unremarkable small bowel study  . HEMORRHOID BANDING    . KNEE SURGERY     right-arthroscopy  . OOPHORECTOMY  age 89s  . PARTIAL HYSTERECTOMY  age 63s  . RECTAL EXAM UNDER ANESTHESIA N/A 10/26/2014   circumferential grade 3 hemoorrhoids easily reducible  . thumb surger Bilateral    trigger thumb  SOCIAL HISTORY: Social History   Tobacco Use  . Smoking status: Never Smoker  . Smokeless tobacco: Never Used  . Tobacco comment: Pt reports tried once in 7th grade//ms  Substance Use Topics  . Alcohol use: Not Currently    Alcohol/week: 1.0 standard drinks    Types: 1 Glasses of wine per week    Comment: rare  . Drug use: No    FAMILY HISTORY: Family History  Problem Relation Age of Onset  . Ulcers Mother   . Glaucoma Mother   . Heart disease Mother   . Diabetes Mother   . Depression Mother   . Sudden death Mother   . Obesity Mother   . Glaucoma Maternal Grandmother   . Heart disease Maternal Grandmother   . Heart disease Maternal Grandfather   . Colon cancer Neg Hx     ROS: Review of Systems  Genitourinary:       Negative for urinary retention.    PHYSICAL EXAM: Pt in no acute distress  RECENT LABS AND TESTS: BMET     Component Value Date/Time   NA 140 04/15/2018 1002   NA 144 03/19/2018   K 4.2 04/15/2018 1002   CL 103 04/15/2018 1002   CO2 29 04/15/2018 1002   GLUCOSE 109 (H) 04/15/2018 1002   BUN 22 04/15/2018 1002   BUN 24 (A) 03/19/2018   CREATININE 0.59 04/15/2018 1002   CALCIUM 9.5 04/15/2018 1002   GFRNONAA >60 04/15/2018 1002   GFRAA >60 04/15/2018 1002   Lab Results  Component Value Date   HGBA1C 5.7 03/19/2018   HGBA1C 5.6 01/08/2018   HGBA1C 6.0 11/16/2017   HGBA1C 5.5 07/09/2017   Lab Results  Component Value Date   INSULIN 10.4 01/08/2018   INSULIN 21.9 07/25/2017   CBC    Component Value Date/Time   WBC 5.1 04/15/2018 1002   RBC 4.67 04/15/2018 1002   HGB 12.8 04/15/2018 1002   HCT 41.5 04/15/2018 1002   PLT 218 04/15/2018 1002   MCV 88.9 04/15/2018 1002   MCH 27.4 04/15/2018 1002   MCHC 30.8 04/15/2018 1002   RDW 13.4 04/15/2018 1002   LYMPHSABS 1.3 04/15/2018 1002   MONOABS 0.4 04/15/2018 1002   EOSABS 0.1 04/15/2018 1002   BASOSABS 0.0 04/15/2018 1002   Iron/TIBC/Ferritin/ %Sat    Component Value Date/Time   IRON 76 10/08/2017 1050   TIBC 389 10/08/2017 1050   FERRITIN 27 04/15/2018 1002   IRONPCTSAT 20 10/08/2017 1050   Lipid Panel     Component Value Date/Time   CHOL 162 03/19/2018   CHOL 179 01/08/2018 1555   TRIG 70 03/19/2018   HDL 46 03/19/2018   HDL 42 01/08/2018 1555   LDLCALC 102 03/19/2018   LDLCALC 121 (H) 01/08/2018 1555   Hepatic Function Panel     Component Value Date/Time   PROT 7.2 04/15/2018 1002   PROT 7.0 01/08/2018 1555   ALBUMIN 3.9 04/15/2018 1002   ALBUMIN 4.2 01/08/2018 1555   AST 19 04/15/2018 1002   ALT 21 04/15/2018 1002   ALKPHOS 44 04/15/2018 1002   BILITOT 0.7 04/15/2018 1002   BILITOT 0.8 01/08/2018 1555      Component Value Date/Time   TSH 0.718 07/25/2017 1239   TSH 0.71 10/22/2012 1030   Results for MAEVYN, RIORDAN D "SANDY" (MRN [id number]) as of 06/11/2018 12:28  Ref. Range 01/08/2018 15:55   Vitamin D, 25-Hydroxy Latest Ref Range: 30.0 - 100.0 ng/mL 37.7     I, Cara  Lorie Apley, CMA, am acting as transcriptionist for Starlyn Skeans, MD I have reviewed the above documentation for accuracy and completeness, and I agree with the above. -Dennard Nip, MD

## 2018-06-12 ENCOUNTER — Other Ambulatory Visit (HOSPITAL_COMMUNITY): Payer: Medicare Other

## 2018-06-18 ENCOUNTER — Other Ambulatory Visit: Payer: Self-pay | Admitting: Radiology

## 2018-06-18 NOTE — Telephone Encounter (Signed)
Patient requesting refill on Hydrocodone, she had it last in January 2019, and only uses it prn severe pain, please advise.

## 2018-06-19 ENCOUNTER — Ambulatory Visit (HOSPITAL_COMMUNITY): Payer: Medicare Other | Admitting: Nurse Practitioner

## 2018-06-19 ENCOUNTER — Telehealth: Payer: Self-pay | Admitting: Radiology

## 2018-06-19 MED ORDER — HYDROCODONE-ACETAMINOPHEN 5-325 MG PO TABS: 1.0000 | ORAL_TABLET | Freq: Four times a day (QID) | ORAL | 0 refills | Status: DC | PRN

## 2018-06-19 NOTE — Telephone Encounter (Signed)
Patient has a lot of knee pain requested Hydrocodone yesterday, can you advise?

## 2018-06-25 ENCOUNTER — Telehealth: Payer: Self-pay | Admitting: Orthopedic Surgery

## 2018-06-25 NOTE — Telephone Encounter (Signed)
Patient requests refill on Hydrocodone/Acetaminophen 5-325  Mgs.   Qty 60  Sig: Take 1 tablet by mouth every 6 (six) hours as needed for moderate pain.  Patient uses [facility]process development [facility] in [place]

## 2018-06-26 ENCOUNTER — Other Ambulatory Visit: Payer: Self-pay | Admitting: Orthopedic Surgery

## 2018-06-26 MED ORDER — HYDROCODONE-ACETAMINOPHEN 5-325 MG PO TABS: 1.0000 | ORAL_TABLET | Freq: Four times a day (QID) | ORAL | 0 refills | Status: DC | PRN

## 2018-07-09 ENCOUNTER — Ambulatory Visit (INDEPENDENT_AMBULATORY_CARE_PROVIDER_SITE_OTHER): Payer: Self-pay | Admitting: Family Medicine

## 2018-07-17 ENCOUNTER — Encounter (INDEPENDENT_AMBULATORY_CARE_PROVIDER_SITE_OTHER): Payer: Self-pay | Admitting: Family Medicine

## 2018-07-17 ENCOUNTER — Other Ambulatory Visit: Payer: Self-pay

## 2018-07-17 ENCOUNTER — Ambulatory Visit (INDEPENDENT_AMBULATORY_CARE_PROVIDER_SITE_OTHER): Payer: Medicare Other | Admitting: Family Medicine

## 2018-07-17 DIAGNOSIS — E669 Obesity, unspecified: Secondary | ICD-10-CM | POA: Diagnosis not present

## 2018-07-17 DIAGNOSIS — G4709 Other insomnia: Secondary | ICD-10-CM

## 2018-07-17 DIAGNOSIS — Z6830 Body mass index (BMI) 30.0-30.9, adult: Secondary | ICD-10-CM | POA: Diagnosis not present

## 2018-07-17 DIAGNOSIS — E119 Type 2 diabetes mellitus without complications: Secondary | ICD-10-CM

## 2018-07-17 MED ORDER — METFORMIN HCL 500 MG PO TABS: 500.0000 mg | ORAL_TABLET | Freq: Every day | ORAL | 0 refills | Status: DC

## 2018-07-18 NOTE — Progress Notes (Signed)
Office: [phone]  /  Fax: [phone] TeleHealth Visit:  Debra Harris has verbally consented to this TeleHealth visit today. The patient is located at home, the provider is located at the [facility] office. The participants in this visit include the listed provider and patient. The visit was conducted today via Face Time.  HPI:   Chief Complaint: OBESITY Debra Harris is here to discuss her progress with her obesity treatment plan. She is on the "COVID19" Category 1 plan and is following her eating plan approximately 0 % of the time. She states she is doing stretches and yard work. Debra Harris feels that she is doing well with maintaining her weight, but she is frustrated. She seems to be stalling. Her resting metabolic rate has never been real high and could be dropping.  We were unable to weigh the patient today for this TeleHealth visit. She feels as if she has maintained weight since her last visit. She has lost 39 lbs since starting treatment with [name].  Insomnia Debra Harris wakes up in the middle of the night and can be awake 2 to 3 hours. She uses her CPAP regularly and tolerates it well. She sees a sleep doctor, but hasn't gotten help.  Diabetes II Debra Harris has a diagnosis of diabetes type II. Debra Harris's last A1c was 5.7 on 03/19/18. She is stable on medications and is well controlled. She has been working on intensive lifestyle modifications including diet, exercise, and weight loss to help control her blood glucose levels. Debra Harris denies nausea, vomiting, or hypoglycemia.  ASSESSMENT AND PLAN:  Other insomnia  Type 2 diabetes mellitus without complication, without long-term current use of insulin ([facility]) - Plan: metFORMIN (GLUCOPHAGE) 500 MG tablet  Class 1 obesity with serious comorbidity and body mass index (BMI) of 30.0 to 30.9 in adult, unspecified obesity type - Starting BMI greater then 30  PLAN:  Insomnia The problem of recurrent insomnia was discussed. We discussed  sleep hygiene techniques for her to try. Avoidance of caffeine sources was strongly encouraged. Basya agrees to follow up in 2 weeks to see if there is any improvement.  Diabetes II Debra Harris has been given extensive diabetes education by myself today including ideal fasting and post-prandial blood glucose readings, individual ideal Hgb A1c goals, and hypoglycemia prevention. We discussed the importance of good blood sugar control to decrease the likelihood of diabetic complications such as nephropathy, neuropathy, limb loss, blindness, coronary artery disease, and death. We discussed the importance of intensive lifestyle modification including diet, exercise, and weight loss as the first line treatment for diabetes. Debra Harris agrees to continue her metformin 500 mg qAM #30 with no refills and will follow up at the agreed upon time in 2 weeks.  Obesity Debra Harris is currently in the action stage of change. As such, her goal is to continue with weight loss efforts She has agreed to follow the Category 1 plan or keep a food journal with 1200 calories and 70+ grams of protein. Debra Harris has been instructed to work up to a goal of 150 minutes of combined cardio and strengthening exercise per week for weight loss and overall health benefits. We discussed the following Behavioral Modification Strategies today: increasing lean protein intake, work on meal planning and easy cooking plans, and emotional eating strategies. Debra Harris agrees to continue her Category 1 plan and we will plan to recheck her RMR when it is safe to come back into the office.  Debra Harris has agreed to follow up with our clinic in 2  weeks. She was informed of the importance of frequent follow up visits to maximize her success with intensive lifestyle modifications for her multiple health conditions.  ALLERGIES: Allergies  Allergen Reactions  . Injectafer [Ferric Carboxymaltose]     Has tolerated feraheme without problems    MEDICATIONS: Current  Outpatient Medications on File Prior to Visit  Medication Sig Dispense Refill  . acetaminophen (TYLENOL) 500 MG tablet Take 500 mg by mouth every 6 (six) hours as needed.    Debra Harris Kitchen amitriptyline (ELAVIL) 25 MG tablet amitriptyline 25 mg tablet    . cetirizine (ZYRTEC) 10 MG tablet Take 10 mg by mouth daily.     Debra Harris Kitchen HYDROcodone-acetaminophen (NORCO/VICODIN) 5-325 MG tablet Take 1 tablet by mouth every 6 (six) hours as needed for moderate pain. 60 tablet 0  . naproxen sodium (ALEVE) 220 MG tablet Take 220-440 mg by mouth daily as needed.     No current facility-administered medications on file prior to visit.     PAST MEDICAL HISTORY: Past Medical History:  Diagnosis Date  . Adenomatous colon polyp   . Allergic rhinitis   . Arthritis   . Back pain   . Constipation   . Diabetes mellitus   . Diverticulosis   . External hemorrhoids   . Fibromyalgia   . Gallbladder problem   . GERD (gastroesophageal reflux disease)   . Glaucoma   . Glaucoma   . Hemorrhoids    grade 3   . Hip pain   . IBS (irritable bowel syndrome)   . IDA (iron deficiency anemia)   . Iron deficiency anemia 06/05/2014  . Irregular heartbeat   . Leg edema   . Leg numbness   . Leg pain   . Low tension glaucoma   . Neuropathy, leg   . Ovarian cyst 11/20/00  . Prediabetes   . Sleep apnea    CPAP  . Sliding hiatal hernia     PAST SURGICAL HISTORY: Past Surgical History:  Procedure Laterality Date  . CARPAL TUNNEL RELEASE Bilateral   . CATARACT EXTRACTION     bilateral  . CHOLECYSTECTOMY    . COLONOSCOPY  2005   Small external hemorrhoids, single sigmoid colon diverticulum, small polyp at the hepatic flexure focally adenomatous.  . COLONOSCOPY  2010   Dr. Gala Romney, Versed 4/Demerol 75. Pancolonic diverticula, external hemorrhoids. Colonoscopy every 5 years for history of adenomatous polyps  . COLONOSCOPY  2014   Dr. Olevia Perches: Propofol. mild diverticulosis in the sigmoid colon, random colon biopsies benign.  .  COLONOSCOPY WITH PROPOFOL N/A 09/13/2015   Procedure: COLONOSCOPY WITH PROPOFOL;  Surgeon: Daneil Dolin, MD;  [facility];  Service: Endoscopy;  Laterality: N/A;  1030  . ESOPHAGEAL DILATION N/A 10/26/2014   Procedure: ESOPHAGEAL DILATION [place];  Surgeon: Daneil Dolin, MD;  Location: [facility];  Service: Endoscopy;  Laterality: N/A;  . ESOPHAGOGASTRODUODENOSCOPY  2010   Dr. Gala Romney, distal esophageal erosions, Venia Minks dilator passed. Comment small hiatal hernia, patulous EG junction.  . ESOPHAGOGASTRODUODENOSCOPY (EGD) WITH PROPOFOL N/A 10/26/2014   Dr.Rourk- distal esophageal erosions c/w mild erosive reflux esophagitis. hiatal hernia, o/w normal EGD  . ESOPHAGOGASTRODUODENOSCOPY (EGD) WITH PROPOFOL N/A 09/13/2015   Procedure: ESOPHAGOGASTRODUODENOSCOPY (EGD) WITH PROPOFOL;  Surgeon: Daneil Dolin, MD;  [facility];  Service: Endoscopy;  Laterality: N/A;  . GIVENS CAPSULE STUDY N/A 10/26/2014   couple of mucosal breaks/erosions but essentially unremarkable small bowel study  . HEMORRHOID BANDING    . KNEE SURGERY  right-arthroscopy  . OOPHORECTOMY  age 28s  . PARTIAL HYSTERECTOMY  age 41s  . RECTAL EXAM UNDER ANESTHESIA N/A 10/26/2014   circumferential grade 3 hemoorrhoids easily reducible  . thumb surger Bilateral    trigger thumb    SOCIAL HISTORY: Social History   Tobacco Use  . Smoking status: Never Smoker  . Smokeless tobacco: Never Used  . Tobacco comment: Pt reports tried once in 7th grade//ms  Substance Use Topics  . Alcohol use: Not Currently    Alcohol/week: 1.0 standard drinks    Types: 1 Glasses of wine per week    Comment: rare  . Drug use: No    FAMILY HISTORY: Family History  Problem Relation Age of Onset  . Ulcers Mother   . Glaucoma Mother   . Heart disease Mother   . Diabetes Mother   . Depression Mother   . Sudden death Mother   . Obesity Mother   . Glaucoma Maternal Grandmother   . Heart disease Maternal Grandmother   .  Heart disease Maternal Grandfather   . Colon cancer Neg Hx     ROS: Review of Systems  Gastrointestinal: Negative for nausea and vomiting.  Endo/Heme/Allergies:       Negative for hypoglycemia.  Psychiatric/Behavioral: The patient has insomnia.     PHYSICAL EXAM: Pt in no acute distress  RECENT LABS AND TESTS: BMET    Component Value Date/Time   NA 140 04/15/2018 1002   NA 144 03/19/2018   K 4.2 04/15/2018 1002   CL 103 04/15/2018 1002   CO2 29 04/15/2018 1002   GLUCOSE 109 (H) 04/15/2018 1002   BUN 22 04/15/2018 1002   BUN 24 (A) 03/19/2018   CREATININE 0.59 04/15/2018 1002   CALCIUM 9.5 04/15/2018 1002   GFRNONAA >60 04/15/2018 1002   GFRAA >60 04/15/2018 1002   Lab Results  Component Value Date   HGBA1C 5.7 03/19/2018   HGBA1C 5.6 01/08/2018   HGBA1C 6.0 11/16/2017   HGBA1C 5.5 07/09/2017   Lab Results  Component Value Date   INSULIN 10.4 01/08/2018   INSULIN 21.9 07/25/2017   CBC    Component Value Date/Time   WBC 5.1 04/15/2018 1002   RBC 4.67 04/15/2018 1002   HGB 12.8 04/15/2018 1002   HCT 41.5 04/15/2018 1002   PLT 218 04/15/2018 1002   MCV 88.9 04/15/2018 1002   MCH 27.4 04/15/2018 1002   MCHC 30.8 04/15/2018 1002   RDW 13.4 04/15/2018 1002   LYMPHSABS 1.3 04/15/2018 1002   MONOABS 0.4 04/15/2018 1002   EOSABS 0.1 04/15/2018 1002   BASOSABS 0.0 04/15/2018 1002   Iron/TIBC/Ferritin/ %Sat    Component Value Date/Time   IRON 76 10/08/2017 1050   TIBC 389 10/08/2017 1050   FERRITIN 27 04/15/2018 1002   IRONPCTSAT 20 10/08/2017 1050   Lipid Panel     Component Value Date/Time   CHOL 162 03/19/2018   CHOL 179 01/08/2018 1555   TRIG 70 03/19/2018   HDL 46 03/19/2018   HDL 42 01/08/2018 1555   LDLCALC 102 03/19/2018   LDLCALC 121 (H) 01/08/2018 1555   Hepatic Function Panel     Component Value Date/Time   PROT 7.2 04/15/2018 1002   PROT 7.0 01/08/2018 1555   ALBUMIN 3.9 04/15/2018 1002   ALBUMIN 4.2 01/08/2018 1555   AST 19  04/15/2018 1002   ALT 21 04/15/2018 1002   ALKPHOS 44 04/15/2018 1002   BILITOT 0.7 04/15/2018 1002   BILITOT 0.8 01/08/2018 1555  Component Value Date/Time   TSH 0.718 07/25/2017 1239   TSH 0.71 10/22/2012 1030   Results for Debra Harris, Debra Harris "SANDY" (MRN [id number]) as of 07/18/2018 13:07  Ref. Range 01/08/2018 15:55  Vitamin D, 25-Hydroxy Latest Ref Range: 30.0 - 100.0 ng/mL 37.7     I, Marcille Blanco, CMA, am acting as transcriptionist for Starlyn Skeans, MD I have reviewed the above documentation for accuracy and completeness, and I agree with the above. -Dennard Nip, MD

## 2018-07-19 DIAGNOSIS — Z03818 Encounter for observation for suspected exposure to other biological agents ruled out: Secondary | ICD-10-CM | POA: Diagnosis not present

## 2018-07-19 DIAGNOSIS — E1140 Type 2 diabetes mellitus with diabetic neuropathy, unspecified: Secondary | ICD-10-CM | POA: Diagnosis not present

## 2018-07-25 DIAGNOSIS — E1140 Type 2 diabetes mellitus with diabetic neuropathy, unspecified: Secondary | ICD-10-CM | POA: Diagnosis not present

## 2018-07-25 DIAGNOSIS — S022XXA Fracture of nasal bones, initial encounter for closed fracture: Secondary | ICD-10-CM | POA: Diagnosis not present

## 2018-07-29 DIAGNOSIS — S022XXA Fracture of nasal bones, initial encounter for closed fracture: Secondary | ICD-10-CM | POA: Insufficient documentation

## 2018-07-29 DIAGNOSIS — J3489 Other specified disorders of nose and nasal sinuses: Secondary | ICD-10-CM | POA: Diagnosis not present

## 2018-07-29 DIAGNOSIS — G4733 Obstructive sleep apnea (adult) (pediatric): Secondary | ICD-10-CM | POA: Diagnosis not present

## 2018-08-01 ENCOUNTER — Ambulatory Visit (INDEPENDENT_AMBULATORY_CARE_PROVIDER_SITE_OTHER): Payer: Medicare Other | Admitting: Family Medicine

## 2018-08-01 ENCOUNTER — Other Ambulatory Visit: Payer: Self-pay

## 2018-08-01 ENCOUNTER — Encounter (INDEPENDENT_AMBULATORY_CARE_PROVIDER_SITE_OTHER): Payer: Self-pay | Admitting: Family Medicine

## 2018-08-01 DIAGNOSIS — E119 Type 2 diabetes mellitus without complications: Secondary | ICD-10-CM

## 2018-08-01 DIAGNOSIS — Z6830 Body mass index (BMI) 30.0-30.9, adult: Secondary | ICD-10-CM

## 2018-08-01 DIAGNOSIS — E669 Obesity, unspecified: Secondary | ICD-10-CM

## 2018-08-01 NOTE — Progress Notes (Signed)
Office: [phone]  /  Fax: [phone] TeleHealth Visit:  Debra Harris has verbally consented to this TeleHealth visit today. The patient is located in the car, the provider is located at the [facility] office. The participants in this visit include the listed provider and patient. Debra Harris was unable to use Face Time today and the telehealth visit was conducted via telephone.  HPI:   Chief Complaint: OBESITY Debra Harris is here to discuss her progress with her obesity treatment plan. She is on the modified Category 1 plan and is following her eating plan approximately 90 to 95 % of the time. She states she is doing stretches and yard work 20 minutes 6 times per week. Debra Harris is frustrated that she cannot be seen in the office yet, due to her COVID risk factors. She has modified our Category 1 plan herself and has done well maintaining her weight, but is upset that this hasn't caused weight loss. She is active in her yard and garden and feels this is enough exercise. She will not keep a food journal as I have suggested and is using shakes and bars to meet her protein goals, which I recommended against.   We were unable to weigh the patient today for this TeleHealth visit. She feels as if she has maintained weight since her last visit. She has lost 39 lbs since starting treatment with Debra Harris.  Diabetes II [name] has a diagnosis of diabetes type II. Debra Harris states that she does not check blood sugars at home. Last A1c was 5.7 on 03/19/18. [name] is stable on metformin and her renal function is normal. She has been working on intensive lifestyle modifications including diet, exercise, and weight loss to help control her blood glucose levels.  ASSESSMENT AND PLAN:  Type 2 diabetes mellitus without complication, without long-term current use of insulin ([facility])  Class 1 obesity with serious comorbidity and body mass index (BMI) of 30.0 to 30.9 in adult, unspecified obesity type - Starting BMI  greater then 30  PLAN:  Diabetes II Debra Harris has been given extensive diabetes education by myself today including ideal fasting and post-prandial blood glucose readings, individual ideal Hgb A1c goals, and hypoglycemia prevention. We discussed the importance of good blood sugar control to decrease the likelihood of diabetic complications such as nephropathy, neuropathy, limb loss, blindness, coronary artery disease, and death. We discussed the importance of intensive lifestyle modification including diet, exercise, and weight loss as the first line treatment for diabetes. Debra Harris agrees to continue her metformin and to get back to the diet that I prescribed strictly. She will follow up as needed.  I spent > than 50% of the 15 minute visit on counseling as documented in the note.  Obesity Debra Harris is currently in the action stage of change. As such, her goal is to continue with weight loss efforts. She has agreed to follow the Category 1 plan. Debra Harris has been instructed to work up to a goal of 150 minutes of combined cardio and strengthening exercise per week for weight loss and overall health benefits. We discussed the following Behavioral Modification Strategies today: increasing lean protein intake. Debra Harris will continue to do her own plan and was encouraged to increase her core strengthening exercises. She will call next month to see if she can be seen in office to have her resting metabolic rate tested.  Debra Harris has agreed to follow up with our clinic as needed. She was informed of the importance of frequent follow up visits  to maximize her success with intensive lifestyle modifications for her multiple health conditions.  ALLERGIES: Allergies  Allergen Reactions  . Injectafer [Ferric Carboxymaltose]     Has tolerated feraheme without problems    MEDICATIONS: Current Outpatient Medications on File Prior to Visit  Medication Sig Dispense Refill  . acetaminophen (TYLENOL) 500 MG tablet Take  500 mg by mouth every 6 (six) hours as needed.    Marland Kitchen amitriptyline (ELAVIL) 25 MG tablet amitriptyline 25 mg tablet    . cetirizine (ZYRTEC) 10 MG tablet Take 10 mg by mouth daily.     Marland Kitchen HYDROcodone-acetaminophen (NORCO/VICODIN) 5-325 MG tablet Take 1 tablet by mouth every 6 (six) hours as needed for moderate pain. 60 tablet 0  . metFORMIN (GLUCOPHAGE) 500 MG tablet Take 1 tablet (500 mg total) by mouth daily with breakfast. 30 tablet 0  . naproxen sodium (ALEVE) 220 MG tablet Take 220-440 mg by mouth daily as needed.     No current facility-administered medications on file prior to visit.     PAST MEDICAL HISTORY: Past Medical History:  Diagnosis Date  . Adenomatous colon polyp   . Allergic rhinitis   . Arthritis   . Back pain   . Constipation   . Diabetes mellitus   . Diverticulosis   . External hemorrhoids   . Fibromyalgia   . Gallbladder problem   . GERD (gastroesophageal reflux disease)   . Glaucoma   . Glaucoma   . Hemorrhoids    grade 3   . Hip pain   . IBS (irritable bowel syndrome)   . IDA (iron deficiency anemia)   . Iron deficiency anemia 06/05/2014  . Irregular heartbeat   . Leg edema   . Leg numbness   . Leg pain   . Low tension glaucoma   . Neuropathy, leg   . Ovarian cyst 11/20/00  . Prediabetes   . Sleep apnea    CPAP  . Sliding hiatal hernia     PAST SURGICAL HISTORY: Past Surgical History:  Procedure Laterality Date  . CARPAL TUNNEL RELEASE Bilateral   . CATARACT EXTRACTION     bilateral  . CHOLECYSTECTOMY    . COLONOSCOPY  2005   Small external hemorrhoids, single sigmoid colon diverticulum, small polyp at the hepatic flexure focally adenomatous.  . COLONOSCOPY  2010   Dr. Gala Romney, Versed 4/Demerol 75. Pancolonic diverticula, external hemorrhoids. Colonoscopy every 5 years for history of adenomatous polyps  . COLONOSCOPY  2014   Dr. Olevia Perches: Propofol. mild diverticulosis in the sigmoid colon, random colon biopsies benign.  . COLONOSCOPY WITH  PROPOFOL N/A 09/13/2015   Procedure: COLONOSCOPY WITH PROPOFOL;  Surgeon: Daneil Dolin, MD;  [facility];  Service: Endoscopy;  Laterality: N/A;  1030  . ESOPHAGEAL DILATION N/A 10/26/2014   Procedure: ESOPHAGEAL DILATION [place];  Surgeon: Daneil Dolin, MD;  Location: [facility];  Service: Endoscopy;  Laterality: N/A;  . ESOPHAGOGASTRODUODENOSCOPY  2010   Dr. Gala Romney, distal esophageal erosions, Venia Minks dilator passed. Comment small hiatal hernia, patulous EG junction.  . ESOPHAGOGASTRODUODENOSCOPY (EGD) WITH PROPOFOL N/A 10/26/2014   Dr.Rourk- distal esophageal erosions c/w mild erosive reflux esophagitis. hiatal hernia, o/w normal EGD  . ESOPHAGOGASTRODUODENOSCOPY (EGD) WITH PROPOFOL N/A 09/13/2015   Procedure: ESOPHAGOGASTRODUODENOSCOPY (EGD) WITH PROPOFOL;  Surgeon: Daneil Dolin, MD;  [facility];  Service: Endoscopy;  Laterality: N/A;  . GIVENS CAPSULE STUDY N/A 10/26/2014   couple of mucosal breaks/erosions but essentially unremarkable small bowel study  . HEMORRHOID  BANDING    . KNEE SURGERY     right-arthroscopy  . OOPHORECTOMY  age 7s  . PARTIAL HYSTERECTOMY  age 17s  . RECTAL EXAM UNDER ANESTHESIA N/A 10/26/2014   circumferential grade 3 hemoorrhoids easily reducible  . thumb surger Bilateral    trigger thumb    SOCIAL HISTORY: Social History   Tobacco Use  . Smoking status: Never Smoker  . Smokeless tobacco: Never Used  . Tobacco comment: Pt reports tried once in 7th grade//ms  Substance Use Topics  . Alcohol use: Not Currently    Alcohol/week: 1.0 standard drinks    Types: 1 Glasses of wine per week    Comment: rare  . Drug use: No    FAMILY HISTORY: Family History  Problem Relation Age of Onset  . Ulcers Mother   . Glaucoma Mother   . Heart disease Mother   . Diabetes Mother   . Depression Mother   . Sudden death Mother   . Obesity Mother   . Glaucoma Maternal Grandmother   . Heart disease Maternal Grandmother   . Heart disease  Maternal Grandfather   . Colon cancer Neg Hx     ROS: ROS  PHYSICAL EXAM: Pt in no acute distress  RECENT LABS AND TESTS: BMET    Component Value Date/Time   NA 140 04/15/2018 1002   NA 144 03/19/2018   K 4.2 04/15/2018 1002   CL 103 04/15/2018 1002   CO2 29 04/15/2018 1002   GLUCOSE 109 (H) 04/15/2018 1002   BUN 22 04/15/2018 1002   BUN 24 (A) 03/19/2018   CREATININE 0.59 04/15/2018 1002   CALCIUM 9.5 04/15/2018 1002   GFRNONAA >60 04/15/2018 1002   GFRAA >60 04/15/2018 1002   Lab Results  Component Value Date   HGBA1C 5.7 03/19/2018   HGBA1C 5.6 01/08/2018   HGBA1C 6.0 11/16/2017   HGBA1C 5.5 07/09/2017   Lab Results  Component Value Date   INSULIN 10.4 01/08/2018   INSULIN 21.9 07/25/2017   CBC    Component Value Date/Time   WBC 5.1 04/15/2018 1002   RBC 4.67 04/15/2018 1002   HGB 12.8 04/15/2018 1002   HCT 41.5 04/15/2018 1002   PLT 218 04/15/2018 1002   MCV 88.9 04/15/2018 1002   MCH 27.4 04/15/2018 1002   MCHC 30.8 04/15/2018 1002   RDW 13.4 04/15/2018 1002   LYMPHSABS 1.3 04/15/2018 1002   MONOABS 0.4 04/15/2018 1002   EOSABS 0.1 04/15/2018 1002   BASOSABS 0.0 04/15/2018 1002   Iron/TIBC/Ferritin/ %Sat    Component Value Date/Time   IRON 76 10/08/2017 1050   TIBC 389 10/08/2017 1050   FERRITIN 27 04/15/2018 1002   IRONPCTSAT 20 10/08/2017 1050   Lipid Panel     Component Value Date/Time   CHOL 162 03/19/2018   CHOL 179 01/08/2018 1555   TRIG 70 03/19/2018   HDL 46 03/19/2018   HDL 42 01/08/2018 1555   LDLCALC 102 03/19/2018   LDLCALC 121 (H) 01/08/2018 1555   Hepatic Function Panel     Component Value Date/Time   PROT 7.2 04/15/2018 1002   PROT 7.0 01/08/2018 1555   ALBUMIN 3.9 04/15/2018 1002   ALBUMIN 4.2 01/08/2018 1555   AST 19 04/15/2018 1002   ALT 21 04/15/2018 1002   ALKPHOS 44 04/15/2018 1002   BILITOT 0.7 04/15/2018 1002   BILITOT 0.8 01/08/2018 1555      Component Value Date/Time   TSH 0.718 07/25/2017 1239    TSH 0.71 10/22/2012  1030   Results for ZELENE, BARGA "SANDY" (MRN [id number]) as of 08/01/2018 13:30  Ref. Range 01/08/2018 15:55  Vitamin D, 25-Hydroxy Latest Ref Range: 30.0 - 100.0 ng/mL 37.7     I, Marcille Blanco, CMA, am acting as transcriptionist for Starlyn Skeans, MD I have reviewed the above documentation for accuracy and completeness, and I agree with the above. -Dennard Nip, MD

## 2018-08-09 ENCOUNTER — Other Ambulatory Visit (HOSPITAL_COMMUNITY): Payer: Self-pay | Admitting: *Deleted

## 2018-08-09 DIAGNOSIS — D500 Iron deficiency anemia secondary to blood loss (chronic): Secondary | ICD-10-CM

## 2018-08-09 DIAGNOSIS — D509 Iron deficiency anemia, unspecified: Secondary | ICD-10-CM

## 2018-08-09 DIAGNOSIS — D508 Other iron deficiency anemias: Secondary | ICD-10-CM

## 2018-08-12 ENCOUNTER — Other Ambulatory Visit: Payer: Self-pay

## 2018-08-12 ENCOUNTER — Inpatient Hospital Stay (HOSPITAL_COMMUNITY): Payer: Medicare Other | Attending: Hematology

## 2018-08-12 DIAGNOSIS — K922 Gastrointestinal hemorrhage, unspecified: Secondary | ICD-10-CM | POA: Insufficient documentation

## 2018-08-12 DIAGNOSIS — D500 Iron deficiency anemia secondary to blood loss (chronic): Secondary | ICD-10-CM | POA: Diagnosis not present

## 2018-08-12 DIAGNOSIS — K909 Intestinal malabsorption, unspecified: Secondary | ICD-10-CM | POA: Insufficient documentation

## 2018-08-12 DIAGNOSIS — D508 Other iron deficiency anemias: Secondary | ICD-10-CM

## 2018-08-12 DIAGNOSIS — D509 Iron deficiency anemia, unspecified: Secondary | ICD-10-CM

## 2018-08-12 LAB — CBC WITH DIFFERENTIAL/PLATELET
Abs Immature Granulocytes: 0.01 10*3/uL (ref 0.00–0.07)
Basophils Absolute: 0 10*3/uL (ref 0.0–0.1)
Basophils Relative: 1 %
Eosinophils Absolute: 0.1 10*3/uL (ref 0.0–0.5)
Eosinophils Relative: 3 %
HCT: 40.3 % (ref 36.0–46.0)
Hemoglobin: 13.1 g/dL (ref 12.0–15.0)
Immature Granulocytes: 0 %
Lymphocytes Relative: 27 %
Lymphs Abs: 1.3 10*3/uL (ref 0.7–4.0)
MCH: 29.6 pg (ref 26.0–34.0)
MCHC: 32.5 g/dL (ref 30.0–36.0)
MCV: 91.2 fL (ref 80.0–100.0)
Monocytes Absolute: 0.4 10*3/uL (ref 0.1–1.0)
Monocytes Relative: 8 %
Neutro Abs: 3 10*3/uL (ref 1.7–7.7)
Neutrophils Relative %: 61 %
Platelets: 181 10*3/uL (ref 150–400)
RBC: 4.42 MIL/uL (ref 3.87–5.11)
RDW: 13.1 % (ref 11.5–15.5)
WBC: 4.9 10*3/uL (ref 4.0–10.5)
nRBC: 0 % (ref 0.0–0.2)

## 2018-08-12 LAB — COMPREHENSIVE METABOLIC PANEL
ALT: 20 U/L (ref 0–44)
AST: 19 U/L (ref 15–41)
Albumin: 3.8 g/dL (ref 3.5–5.0)
Alkaline Phosphatase: 45 U/L (ref 38–126)
Anion gap: 12 (ref 5–15)
BUN: 16 mg/dL (ref 8–23)
CO2: 27 mmol/L (ref 22–32)
Calcium: 9.3 mg/dL (ref 8.9–10.3)
Chloride: 103 mmol/L (ref 98–111)
Creatinine, Ser: 0.57 mg/dL (ref 0.44–1.00)
GFR calc Af Amer: >60 mL/min (ref >60)
GFR calc non Af Amer: >60 mL/min (ref >60)
Glucose, Bld: 110 mg/dL — ABNORMAL HIGH (ref 70–99)
Potassium: 3.9 mmol/L (ref 3.5–5.1)
Sodium: 142 mmol/L (ref 135–145)
Total Bilirubin: 1 mg/dL (ref 0.3–1.2)
Total Protein: 7.1 g/dL (ref 6.5–8.1)

## 2018-08-12 LAB — FERRITIN: Ferritin: 110 ng/mL (ref 11–307)

## 2018-08-12 LAB — LACTATE DEHYDROGENASE: LDH: 126 U/L (ref 98–192)

## 2018-08-16 ENCOUNTER — Other Ambulatory Visit: Payer: Self-pay

## 2018-08-19 ENCOUNTER — Ambulatory Visit (HOSPITAL_COMMUNITY): Payer: Medicare Other | Admitting: Hematology

## 2018-08-21 ENCOUNTER — Other Ambulatory Visit: Payer: Self-pay

## 2018-08-22 ENCOUNTER — Inpatient Hospital Stay (HOSPITAL_BASED_OUTPATIENT_CLINIC_OR_DEPARTMENT_OTHER): Payer: Medicare Other | Admitting: Hematology

## 2018-08-22 ENCOUNTER — Encounter (HOSPITAL_COMMUNITY): Payer: Self-pay | Admitting: Hematology

## 2018-08-22 DIAGNOSIS — D500 Iron deficiency anemia secondary to blood loss (chronic): Secondary | ICD-10-CM | POA: Diagnosis not present

## 2018-08-22 DIAGNOSIS — K909 Intestinal malabsorption, unspecified: Secondary | ICD-10-CM | POA: Diagnosis not present

## 2018-08-22 DIAGNOSIS — D508 Other iron deficiency anemias: Secondary | ICD-10-CM

## 2018-08-22 DIAGNOSIS — K922 Gastrointestinal hemorrhage, unspecified: Secondary | ICD-10-CM

## 2018-08-22 NOTE — Progress Notes (Signed)
[place], [place] [address]   CLINIC:  Medical Oncology/Hematology  PCP:  Asencion Noble, MD [address] [place] [address] [phone]    INTERVAL HISTORY:  [name] 68 y.o. female presents today for follow-up.  She reports overall doing well.  Denies any significant fatigue.  She actually states that her energy is improved.  She denies any obvious signs of bleeding.  Her last iron infusion was in February 2020.  She denies any changes to her health history since her last visit.  She does report increased anxiety with current events.  She is here to discuss results of recent lab work.   REVIEW OF SYSTEMS:  Review of Systems  Psychiatric/Behavioral: The patient is nervous/anxious.   All other systems reviewed and are negative.    PAST MEDICAL/SURGICAL HISTORY:  Past Medical History:  Diagnosis Date   Adenomatous colon polyp    Allergic rhinitis    Arthritis    Back pain    Constipation    Diabetes mellitus    Diverticulosis    External hemorrhoids    Fibromyalgia    Gallbladder problem    GERD (gastroesophageal reflux disease)    Glaucoma    Glaucoma    Hemorrhoids    grade 3    Hip pain    IBS (irritable bowel syndrome)    IDA (iron deficiency anemia)    Iron deficiency anemia 06/05/2014   Irregular heartbeat    Leg edema    Leg numbness    Leg pain    Low tension glaucoma    Neuropathy, leg    Ovarian cyst 11/20/00   Prediabetes    Sleep apnea    CPAP   Sliding hiatal hernia    Past Surgical History:  Procedure Laterality Date   CARPAL TUNNEL RELEASE Bilateral    CATARACT EXTRACTION     bilateral   CHOLECYSTECTOMY     COLONOSCOPY  2005   Small external hemorrhoids, single sigmoid colon diverticulum, small polyp at the hepatic flexure focally adenomatous.   COLONOSCOPY  2010   Dr. Gala Romney, Versed 4/Demerol 75. Pancolonic diverticula, external hemorrhoids. Colonoscopy  every 5 years for history of adenomatous polyps   COLONOSCOPY  2014   Dr. Olevia Perches: Propofol. mild diverticulosis in the sigmoid colon, random colon biopsies benign.   COLONOSCOPY WITH PROPOFOL N/A 09/13/2015   Procedure: COLONOSCOPY WITH PROPOFOL;  Surgeon: Daneil Dolin, MD;  [facility]: [facility];  Service: Endoscopy;  Laterality: N/A;  1030   ESOPHAGEAL DILATION N/A 10/26/2014   Procedure: ESOPHAGEAL DILATION [place];  Surgeon: Daneil Dolin, MD;  Location: [facility];  Service: Endoscopy;  Laterality: N/A;   ESOPHAGOGASTRODUODENOSCOPY  2010   Dr. Gala Romney, distal esophageal erosions, Venia Minks dilator passed. Comment small hiatal hernia, patulous EG junction.   ESOPHAGOGASTRODUODENOSCOPY (EGD) WITH PROPOFOL N/A 10/26/2014   Dr.Rourk- distal esophageal erosions c/w mild erosive reflux esophagitis. hiatal hernia, o/w normal EGD   ESOPHAGOGASTRODUODENOSCOPY (EGD) WITH PROPOFOL N/A 09/13/2015   Procedure: ESOPHAGOGASTRODUODENOSCOPY (EGD) WITH PROPOFOL;  Surgeon: Daneil Dolin, MD;  [facility];  Service: Endoscopy;  Laterality: N/A;   GIVENS CAPSULE STUDY N/A 10/26/2014   couple of mucosal breaks/erosions but essentially unremarkable small bowel study   [place]     right-arthroscopy   OOPHORECTOMY  age 14s   PARTIAL HYSTERECTOMY  age 35s   RECTAL EXAM UNDER ANESTHESIA N/A 10/26/2014   circumferential grade  3 hemoorrhoids easily reducible   thumb surger Bilateral    trigger thumb     SOCIAL HISTORY:  Social History   Socioeconomic History   Marital status: Married    Spouse name: J. Orland Dec, MD   Number of children: 3   Years of education: Not on file   Highest education level: Not on file  Occupational History   Occupation: AUDIOLOGIST, retired  Scientist, product/process development strain: Not on file   Food insecurity    Worry: Not on file    Inability: Not on Lexicographer needs    Medical: Not on file     Non-medical: Not on file  Tobacco Use   Smoking status: Never Smoker   Smokeless tobacco: Never Used   Tobacco comment: Pt reports tried once in 7th grade//ms  Substance and Sexual Activity   Alcohol use: Not Currently    Alcohol/week: 1.0 standard drinks    Types: 1 Glasses of wine per week    Comment: rare   Drug use: No   Sexual activity: Never    Birth control/protection: None  Lifestyle   Physical activity    Days per week: Not on file    Minutes per session: Not on file   Stress: Not on file  Relationships   Social connections    Talks on phone: Not on file    Gets together: Not on file    Attends religious service: Not on file    Active member of club or organization: Not on file    Attends meetings of clubs or organizations: Not on file    Relationship status: Not on file   Intimate partner violence    Fear of current or ex partner: Not on file    Emotionally abused: Not on file    Physically abused: Not on file    Forced sexual activity: Not on file  Other Topics Concern   Not on file  Social History Narrative   3 children & married to orthopedist. 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    FAMILY HISTORY:  Family History  Problem Relation Age of Onset   Ulcers Mother    Glaucoma Mother    Heart disease Mother    Diabetes Mother    Depression Mother    Sudden death Mother    Obesity Mother    Glaucoma Maternal Grandmother    Heart disease Maternal Grandmother    Heart disease Maternal Grandfather    Colon cancer Neg Hx     CURRENT MEDICATIONS:  Outpatient Encounter Medications as of 08/22/2018  Medication Sig   acetaminophen (TYLENOL) 500 MG tablet Take 500 mg by mouth every 6 (six) hours as needed.   cetirizine (ZYRTEC) 10 MG tablet Take 10 mg by mouth daily.    metFORMIN (GLUCOPHAGE) 500 MG tablet Take 1 tablet (500 mg total) by mouth daily with breakfast.   HYDROcodone-acetaminophen (NORCO/VICODIN) 5-325 MG tablet  Take 1 tablet by mouth every 6 (six) hours as needed for moderate pain. (Patient not taking: Reported on 08/22/2018)   latanoprost (XALATAN) 0.005 % ophthalmic solution    naproxen sodium (ALEVE) 220 MG tablet Take 220-440 mg by mouth daily as needed.   [DISCONTINUED] amitriptyline (ELAVIL) 25 MG tablet amitriptyline 25 mg tablet   No facility-administered encounter medications on file as of 08/22/2018.     ALLERGIES:  Allergies  Allergen Reactions   Injectafer [Ferric Carboxymaltose]     Has tolerated feraheme  without problems     PHYSICAL EXAM:  ECOG Performance status: 1  Vitals:   08/22/18 1100  BP: (!) 126/93  Pulse: (!) 58  Resp: 16  Temp: (!) 97.3 F (36.3 C)  SpO2: 100%   Filed Weights   08/22/18 1100  Weight: 178 lb 6 oz (80.9 kg)    Physical Exam Constitutional:      Appearance: Normal appearance. She is obese.  HENT:     Head: Normocephalic.     Nose: Nose normal.     Mouth/Throat:     Mouth: Mucous membranes are moist.     Pharynx: Oropharynx is clear.  Eyes:     Extraocular Movements: Extraocular movements intact.     Conjunctiva/sclera: Conjunctivae normal.  Neck:     Musculoskeletal: Normal range of motion.  Cardiovascular:     Rate and Rhythm: Normal rate and regular rhythm.     Pulses: Normal pulses.     Heart sounds: Normal heart sounds.  Pulmonary:     Effort: Pulmonary effort is normal.     Breath sounds: Normal breath sounds.  Abdominal:     General: Bowel sounds are normal.     Palpations: Abdomen is soft.  Musculoskeletal: Normal range of motion.  Skin:    General: Skin is warm and dry.  Neurological:     General: No focal deficit present.     Mental Status: She is alert and oriented to person, place, and time.  Psychiatric:        Mood and Affect: Mood normal.        Behavior: Behavior normal.        Thought Content: Thought content normal.        Judgment: Judgment normal.      LABORATORY DATA:  I have reviewed the  labs as listed.  CBC    Component Value Date/Time   WBC 4.9 08/12/2018 1108   RBC 4.42 08/12/2018 1108   HGB 13.1 08/12/2018 1108   HCT 40.3 08/12/2018 1108   PLT 181 08/12/2018 1108   MCV 91.2 08/12/2018 1108   MCH 29.6 08/12/2018 1108   MCHC 32.5 08/12/2018 1108   RDW 13.1 08/12/2018 1108   LYMPHSABS 1.3 08/12/2018 1108   MONOABS 0.4 08/12/2018 1108   EOSABS 0.1 08/12/2018 1108   BASOSABS 0.0 08/12/2018 1108   CMP Latest Ref Rng & Units 08/12/2018 04/15/2018 03/19/2018  Glucose 70 - 99 mg/dL 110(H) 109(H) -  BUN 8 - 23 mg/dL 16 22 24(A)  Creatinine 0.44 - 1.00 mg/dL 0.57 0.59 0.7  Sodium 135 - 145 mmol/L 142 140 144  Potassium 3.5 - 5.1 mmol/L 3.9 4.2 5.1  Chloride 98 - 111 mmol/L 103 103 -  CO2 22 - 32 mmol/L 27 29 -  Calcium 8.9 - 10.3 mg/dL 9.3 9.5 -  Total Protein 6.5 - 8.1 g/dL 7.1 7.2 -  Total Bilirubin 0.3 - 1.2 mg/dL 1.0 0.7 -  Alkaline Phos 38 - 126 U/L 45 44 55  AST 15 - 41 U/L 19 19 16   ALT 0 - 44 U/L 20 21 20        ASSESSMENT & PLAN:   Iron deficiency anemia 1.  Iron deficiency anemia: - Colonoscopy on 09/13/2015 showing diverticuli in sigmoid colon, descending colon and transverse colon, internal and external hemorrhoids banded -EGD on 09/13/2015 showing normal esophagus, small hiatal hernia - Feraheme infusion in January and February 2019 - Iron deficiency state from a combination of malabsorption and chronic GI loss -  Last Feraheme infusion: February 2020 -Most recent labs reveal hemoglobin of 13.1, MCV 91.2, ferritin 110.  Labs are stable and adequate.  Would not recommend any additional IV Feraheme at this time.  We will continue to monitor. -Return to clinic in 4 months.       Orders placed this encounter:  Orders Placed This Encounter  Procedures   CBC with Differential   Comprehensive metabolic panel   Ferritin   Iron and TIBC      Roger Shelter, [place] [phone]

## 2018-08-22 NOTE — Assessment & Plan Note (Signed)
1.  Iron deficiency anemia: - Colonoscopy on 09/13/2015 showing diverticuli in sigmoid colon, descending colon and transverse colon, internal and external hemorrhoids banded -EGD on 09/13/2015 showing normal esophagus, small hiatal hernia - Feraheme infusion in January and February 2019 - Iron deficiency state from a combination of malabsorption and chronic GI loss - Last Feraheme infusion: February 2020 -Most recent labs reveal hemoglobin of 13.1, MCV 91.2, ferritin 110.  Labs are stable and adequate.  Would not recommend any additional IV Feraheme at this time.  We will continue to monitor. -Return to clinic in 4 months.

## 2018-09-21 ENCOUNTER — Other Ambulatory Visit (INDEPENDENT_AMBULATORY_CARE_PROVIDER_SITE_OTHER): Payer: Self-pay | Admitting: Family Medicine

## 2018-09-21 DIAGNOSIS — E119 Type 2 diabetes mellitus without complications: Secondary | ICD-10-CM

## 2018-10-04 DIAGNOSIS — H401132 Primary open-angle glaucoma, bilateral, moderate stage: Secondary | ICD-10-CM | POA: Diagnosis not present

## 2018-10-04 DIAGNOSIS — D3131 Benign neoplasm of right choroid: Secondary | ICD-10-CM | POA: Diagnosis not present

## 2018-10-04 DIAGNOSIS — H04123 Dry eye syndrome of bilateral lacrimal glands: Secondary | ICD-10-CM | POA: Diagnosis not present

## 2018-10-04 DIAGNOSIS — H43813 Vitreous degeneration, bilateral: Secondary | ICD-10-CM | POA: Diagnosis not present

## 2018-10-04 DIAGNOSIS — Z961 Presence of intraocular lens: Secondary | ICD-10-CM | POA: Diagnosis not present

## 2018-10-05 ENCOUNTER — Encounter (INDEPENDENT_AMBULATORY_CARE_PROVIDER_SITE_OTHER): Payer: Self-pay | Admitting: Family Medicine

## 2018-10-07 NOTE — Telephone Encounter (Signed)
Please advise 

## 2018-10-28 ENCOUNTER — Ambulatory Visit (INDEPENDENT_AMBULATORY_CARE_PROVIDER_SITE_OTHER): Payer: Medicare Other | Admitting: Family Medicine

## 2018-10-28 ENCOUNTER — Other Ambulatory Visit: Payer: Self-pay

## 2018-10-28 ENCOUNTER — Encounter (INDEPENDENT_AMBULATORY_CARE_PROVIDER_SITE_OTHER): Payer: Self-pay | Admitting: Family Medicine

## 2018-10-28 VITALS — BP 139/69 | HR 59 | Temp 97.8°F | Ht 64.0 in | Wt 179.0 lb

## 2018-10-28 DIAGNOSIS — R0602 Shortness of breath: Secondary | ICD-10-CM

## 2018-10-28 DIAGNOSIS — Z6830 Body mass index (BMI) 30.0-30.9, adult: Secondary | ICD-10-CM | POA: Diagnosis not present

## 2018-10-28 DIAGNOSIS — E119 Type 2 diabetes mellitus without complications: Secondary | ICD-10-CM

## 2018-10-28 DIAGNOSIS — E669 Obesity, unspecified: Secondary | ICD-10-CM | POA: Diagnosis not present

## 2018-10-28 MED ORDER — RYBELSUS 3 MG PO TABS: 3.0000 mg | ORAL_TABLET | ORAL | 0 refills | Status: DC

## 2018-10-28 NOTE — Progress Notes (Signed)
Office: [phone]  /  Fax: [phone]   HPI:   Chief Complaint: OBESITY Debra Harris is here to discuss her progress with her obesity treatment plan. She is on the Category 1 plan and is following her eating plan approximately 92 % of the time. She states she is doing yard work, biking and back exercises 20 to 30 minutes 5 to 6 times per week. Debra Harris has been struggling with weight gain on her 1,000 calorie, Category 1 eating plan. She wonders if it is due to a low metabolism; so her RMR was tested today and was 1785. She is clearly eating more calories than she states. Debra Harris is ready to get back on track. Her weight is 179 lb (81.2 kg) today and has had a weight gain of 10 pounds over a period of 12 weeks since her last visit. She has lost 29 lbs since starting treatment with [name].  Diabetes II Debra Harris has a diagnosis of diabetes type II. Last A1c was at 5.7. She has stopped Metformin, but she has been gaining weight. She struggles with increased hunger at times. She has been working on intensive lifestyle modifications including diet, exercise, and weight loss to help control her blood glucose levels.  Dyspnea with Exercise Debra Harris still notes some shortness of breath with exercising, but she is doing very well with exercise.  Debra Harris denies shortness of breath at rest or orthopnea.  ASSESSMENT AND PLAN:  Type 2 diabetes mellitus without complication, without long-term current use of insulin (HCC) - Plan: Semaglutide (RYBELSUS) 3 MG TABS  Shortness of breath on exertion  Class 1 obesity with serious comorbidity and body mass index (BMI) of 30.0 to 30.9 in adult, unspecified obesity type  PLAN:  Diabetes II Debra Harris has been given extensive diabetes education by myself today including ideal fasting and post-prandial blood glucose readings, individual ideal Hgb A1c goals and hypoglycemia prevention. We discussed the importance of good blood sugar control to decrease the likelihood of  diabetic complications such as nephropathy, neuropathy, limb loss, blindness, coronary artery disease, and death. We discussed the importance of intensive lifestyle modification including diet, exercise and weight loss as the first line treatment for diabetes. Debra Harris agrees to start Rybelsus  3 mg each morning #30 with no refills and discontinue metformin. Debra Harris agreed to follow up at the agreed upon time.  Dyspnea with exercise Debra Harris's shortness of breath appears to be obesity related and exercise induced. The repeat indirect calorimeter results shows her RMR has improved. (VO2 is 256, RMR is 1785 and BMR is 1438). She has agreed to continue weight loss and exercise, as is to treat her exercise induced shortness of breath.  I spent > than 50% of the 25 minute visit on counseling as documented in the note.  Obesity Debra Harris is currently in the action stage of change. As such, her goal is to continue with weight loss efforts She has agreed to change to the Category 2 plan Debra Harris has been instructed to work up to a goal of 150 minutes of combined cardio and strengthening exercise per week for weight loss and overall health benefits. We discussed the following Behavioral Modification Strategies today: no skipping meals, increasing lean protein intake, decreasing simple carbohydrates and work on meal planning and easy cooking plans  Debra Harris has agreed to follow up with our clinic in 2 to 3 weeks. She was informed of the importance of frequent follow up visits to maximize her success with intensive lifestyle modifications for her multiple health conditions.  ALLERGIES: Allergies  Allergen Reactions  . Injectafer [Ferric Carboxymaltose]     Has tolerated feraheme without problems    MEDICATIONS: Current Outpatient Medications on File Prior to Visit  Medication Sig Dispense Refill  . acetaminophen (TYLENOL) 500 MG tablet Take 500 mg by mouth every 6 (six) hours as needed.    . cetirizine (ZYRTEC)  10 MG tablet Take 10 mg by mouth daily.     Mar[place] Kitchen HYDROcodone-acetaminophen (NORCO/VICODIN) 5-325 MG tablet Take 1 tablet by mouth every 6 (six) hours as needed for moderate pain. 60 tablet 0  . latanoprost (XALATAN) 0.005 % ophthalmic solution     . naproxen sodium (ALEVE) 220 MG tablet Take 220-440 mg by mouth daily as needed.     No current facility-administered medications on file prior to visit.     PAST MEDICAL HISTORY: Past Medical History:  Diagnosis Date  . Adenomatous colon polyp   . Allergic rhinitis   . Arthritis   . Back pain   . Constipation   . Diabetes mellitus   . Diverticulosis   . External hemorrhoids   . Fibromyalgia   . Gallbladder problem   . GERD (gastroesophageal reflux disease)   . Glaucoma   . Glaucoma   . Hemorrhoids    grade 3   . Hip pain   . IBS (irritable bowel syndrome)   . IDA (iron deficiency anemia)   . Iron deficiency anemia 06/05/2014  . Irregular heartbeat   . Leg edema   . Leg numbness   . Leg pain   . Low tension glaucoma   . Neuropathy, leg   . Ovarian cyst 11/20/00  . Prediabetes   . Sleep apnea    CPAP  . Sliding hiatal hernia     PAST SURGICAL HISTORY: Past Surgical History:  Procedure Laterality Date  . CARPAL TUNNEL RELEASE Bilateral   . CATARACT EXTRACTION     bilateral  . CHOLECYSTECTOMY    . COLONOSCOPY  2005   Small external hemorrhoids, single sigmoid colon diverticulum, small polyp at the hepatic flexure focally adenomatous.  . COLONOSCOPY  2010   Dr. Gala Romney, Versed 4/Demerol 75. Pancolonic diverticula, external hemorrhoids. Colonoscopy every 5 years for history of adenomatous polyps  . COLONOSCOPY  2014   Dr. Olevia Perches: Propofol. mild diverticulosis in the sigmoid colon, random colon biopsies benign.  . COLONOSCOPY WITH PROPOFOL N/A 09/13/2015   Procedure: COLONOSCOPY WITH PROPOFOL;  Surgeon: Daneil Dolin, MD;  [facility];  Service: Endoscopy;  Laterality: N/A;  1030  . ESOPHAGEAL DILATION N/A 10/26/2014    Procedure: ESOPHAGEAL DILATION [place];  Surgeon: Daneil Dolin, MD;  Location: [facility];  Service: Endoscopy;  Laterality: N/A;  . ESOPHAGOGASTRODUODENOSCOPY  2010   Dr. Gala Romney, distal esophageal erosions, Venia Minks dilator passed. Comment small hiatal hernia, patulous EG junction.  . ESOPHAGOGASTRODUODENOSCOPY (EGD) WITH PROPOFOL N/A 10/26/2014   Dr.Rourk- distal esophageal erosions c/w mild erosive reflux esophagitis. hiatal hernia, o/w normal EGD  . ESOPHAGOGASTRODUODENOSCOPY (EGD) WITH PROPOFOL N/A 09/13/2015   Procedure: ESOPHAGOGASTRODUODENOSCOPY (EGD) WITH PROPOFOL;  Surgeon: Daneil Dolin, MD;  [facility];  Service: Endoscopy;  Laterality: N/A;  . GIVENS CAPSULE STUDY N/A 10/26/2014   couple of mucosal breaks/erosions but essentially unremarkable small bowel study  . HEMORRHOID BANDING    . KNEE SURGERY     right-arthroscopy  . OOPHORECTOMY  age 44s  . PARTIAL HYSTERECTOMY  age 67s  . RECTAL EXAM UNDER ANESTHESIA N/A 10/26/2014   circumferential grade  3 hemoorrhoids easily reducible  . thumb surger Bilateral    trigger thumb    SOCIAL HISTORY: Social History   Tobacco Use  . Smoking status: Never Smoker  . Smokeless tobacco: Never Used  . Tobacco comment: Pt reports tried once in 7th grade//ms  Substance Use Topics  . Alcohol use: Not Currently    Alcohol/week: 1.0 standard drinks    Types: 1 Glasses of wine per week    Comment: rare  . Drug use: No    FAMILY HISTORY: Family History  Problem Relation Age of Onset  . Ulcers Mother   . Glaucoma Mother   . Heart disease Mother   . Diabetes Mother   . Depression Mother   . Sudden death Mother   . Obesity Mother   . Glaucoma Maternal Grandmother   . Heart disease Maternal Grandmother   . Heart disease Maternal Grandfather   . Colon cancer Neg Hx     ROS: Review of Systems  Constitutional: Negative for weight loss.  Respiratory: Positive for shortness of breath (on exertion).        Negative for  shortness of breath at rest  Cardiovascular: Negative for orthopnea.  Endo/Heme/Allergies:       Positive for polyphagia    PHYSICAL EXAM: Blood pressure 139/69, pulse (!) 59, temperature 97.8 F (36.6 C), temperature source Oral, height 5\' 4"  (1.626 m), weight 179 lb (81.2 kg), SpO2 99 %. Body mass index is 30.73 kg/m. Physical Exam Vitals signs reviewed.  Constitutional:      Appearance: Normal appearance. She is well-developed. She is obese.  Cardiovascular:     Rate and Rhythm: Normal rate.  Pulmonary:     Effort: Pulmonary effort is normal.  Musculoskeletal: Normal range of motion.  Skin:    General: Skin is warm and dry.  Neurological:     Mental Status: She is alert and oriented to person, place, and time.  Psychiatric:        Mood and Affect: Mood normal.        Behavior: Behavior normal.     RECENT LABS AND TESTS: BMET    Component Value Date/Time   NA 142 08/12/2018 1108   NA 144 03/19/2018   K 3.9 08/12/2018 1108   CL 103 08/12/2018 1108   CO2 27 08/12/2018 1108   GLUCOSE 110 (H) 08/12/2018 1108   BUN 16 08/12/2018 1108   BUN 24 (A) 03/19/2018   CREATININE 0.57 08/12/2018 1108   CALCIUM 9.3 08/12/2018 1108   GFRNONAA >60 08/12/2018 1108   GFRAA >60 08/12/2018 1108   Lab Results  Component Value Date   HGBA1C 5.7 03/19/2018   HGBA1C 5.6 01/08/2018   HGBA1C 6.0 11/16/2017   HGBA1C 5.5 07/09/2017   Lab Results  Component Value Date   INSULIN 10.4 01/08/2018   INSULIN 21.9 07/25/2017   CBC    Component Value Date/Time   WBC 4.9 08/12/2018 1108   RBC 4.42 08/12/2018 1108   HGB 13.1 08/12/2018 1108   HCT 40.3 08/12/2018 1108   PLT 181 08/12/2018 1108   MCV 91.2 08/12/2018 1108   MCH 29.6 08/12/2018 1108   MCHC 32.5 08/12/2018 1108   RDW 13.1 08/12/2018 1108   LYMPHSABS 1.3 08/12/2018 1108   MONOABS 0.4 08/12/2018 1108   EOSABS 0.1 08/12/2018 1108   BASOSABS 0.0 08/12/2018 1108   Iron/TIBC/Ferritin/ %Sat    Component Value Date/Time    IRON 76 10/08/2017 1050   TIBC 389 10/08/2017 1050  FERRITIN 110 08/12/2018 1108   IRONPCTSAT 20 10/08/2017 1050   Lipid Panel     Component Value Date/Time   CHOL 162 03/19/2018   CHOL 179 01/08/2018 1555   TRIG 70 03/19/2018   HDL 46 03/19/2018   HDL 42 01/08/2018 1555   LDLCALC 102 03/19/2018   LDLCALC 121 (H) 01/08/2018 1555   Hepatic Function Panel     Component Value Date/Time   PROT 7.1 08/12/2018 1108   PROT 7.0 01/08/2018 1555   ALBUMIN 3.8 08/12/2018 1108   ALBUMIN 4.2 01/08/2018 1555   AST 19 08/12/2018 1108   ALT 20 08/12/2018 1108   ALKPHOS 45 08/12/2018 1108   BILITOT 1.0 08/12/2018 1108   BILITOT 0.8 01/08/2018 1555      Component Value Date/Time   TSH 0.718 07/25/2017 1239   TSH 0.71 10/22/2012 1030      OBESITY BEHAVIORAL INTERVENTION VISIT  Today's visit was # 17   Starting weight: 208 lbs Starting date: 07/25/2017 Today's weight : 179 lbs Today's date: 10/28/2018 Total lbs lost to date: 29    10/28/2018  Height 5\' 4"  (1.626 m)  Weight 179 lb (81.2 kg)  BMI (Calculated) 30.71  BLOOD PRESSURE - SYSTOLIC [id number]  BLOOD PRESSURE - DIASTOLIC 69   Body Fat % 42 %  Total Body Water (lbs) 77.8 lbs  RMR 1785    ASK: We discussed the diagnosis of obesity with Debra Harris today and Debra Harris agreed to give [name] permission to discuss obesity behavioral modification therapy today.  ASSESS: Nayleen has the diagnosis of obesity and her BMI today is 30.71 [place] is in the action stage of change   ADVISE: Natori was educated on the multiple health risks of obesity as well as the benefit of weight loss to improve her health. She was advised of the need for long term treatment and the importance of lifestyle modifications to improve her current health and to decrease her risk of future health problems.  AGREE: Multiple dietary modification options and treatment options were discussed and  Debra Harris agreed to follow the recommendations documented in the above  note.  ARRANGE: Debra Harris was educated on the importance of frequent visits to treat obesity as outlined per CMS and USPSTF guidelines and agreed to schedule her next follow up appointment today.  I, Doreene Nest, am acting as transcriptionist for Dennard Nip, MD  I have reviewed the above documentation for accuracy and completeness, and I agree with the above. -Dennard Nip, MD

## 2018-11-14 ENCOUNTER — Encounter (INDEPENDENT_AMBULATORY_CARE_PROVIDER_SITE_OTHER): Payer: Self-pay

## 2018-11-18 ENCOUNTER — Ambulatory Visit (INDEPENDENT_AMBULATORY_CARE_PROVIDER_SITE_OTHER): Payer: Medicare Other | Admitting: Family Medicine

## 2018-11-25 DIAGNOSIS — R1013 Epigastric pain: Secondary | ICD-10-CM | POA: Diagnosis not present

## 2018-11-25 DIAGNOSIS — E1169 Type 2 diabetes mellitus with other specified complication: Secondary | ICD-10-CM | POA: Diagnosis not present

## 2018-11-25 DIAGNOSIS — E785 Hyperlipidemia, unspecified: Secondary | ICD-10-CM | POA: Diagnosis not present

## 2018-12-03 DIAGNOSIS — N390 Urinary tract infection, site not specified: Secondary | ICD-10-CM | POA: Diagnosis not present

## 2018-12-11 NOTE — Progress Notes (Signed)
Virtual Visit via Telephone Note  I connected with Debra Harris on 12/12/18 at  9:30 AM EDT by telephone and verified that I am speaking with the correct person using two identifiers.  Location: Patient: Home Provider: [facility] - [id number], [place], [place], [place] [address]   I discussed the limitations, risks, security and privacy concerns of performing an evaluation and management service by telephone and the availability of in person appointments. I also discussed with the patient that there may be a patient responsible charge related to this service. The patient expressed understanding and agreed to proceed.  Patient consented to consult via telephone: Yes People present and their role in pt care: Pt    History of Present Illness:  68 year old female followed in our office for severe obstructive sleep apnea  Past medical history: GERD, arthritis, prediabetes Smoking history: Never Smoker Maintenance: None Patient of Dr. Halford Chessman  Chief complaint: Severe OSA - CPAP   68 year old female never smoker followed in our office for severe obstructive sleep apnea.  Former patient of Dr. Gwenette Greet as well as Dr. Creig Hines.  Patient reports she is doing well.  She reports she has no issues with using her CPAP.  She reports she cannot sleep without it.  She even naps with it.  She does have an old CPAP that she uses if she travels to visit family in [place].  CPAP compliance shows excellent compliance.  See compliance report listed below:  11/11/2018-12/10/2018 27- out of last 30 days use, 27 of those days greater than 4 hours, average usage 7 hours and 43 minutes, APAP setting 4.4-12, 95th percentile 10.2, AHI 0.5.  Patient reports that the 3 days that she did not use it over the last 30 days was when she was in [place] using her old machine.   Doing well  No issues  Naps with it  Cant sleep without it     Observations/Objective:  POLYSOMNOGRAM 11/25/2010:  AHI  68.6 events/hr. low saturation 81% during REM sleep. Periodic limb movement index 0. Average heart rate 69 bpm with no significant dysrhythmias noted. Optimal CPAP 13cm H2O.  Assessment and Plan:  OSA (obstructive sleep apnea) Plan:  Continue CPAP nightly  Continue So Clean  If you travel to [place] use old cpap  Use it with naps  Continue to work on increasing overall physical activity Work to reduce your BMI  Follow Up Instructions:  Return in about 1 year (around 12/12/2019), or if symptoms worsen or fail to improve, for Follow up with Dr. Halford Chessman.   I discussed the assessment and treatment plan with the patient. The patient was provided an opportunity to ask questions and all were answered. The patient agreed with the plan and demonstrated an understanding of the instructions.   The patient was advised to call back or seek an in-person evaluation if the symptoms worsen or if the condition fails to improve as anticipated.  I provided 15 minutes of non-face-to-face time during this encounter.   Lauraine Rinne, NP

## 2018-12-12 ENCOUNTER — Ambulatory Visit (INDEPENDENT_AMBULATORY_CARE_PROVIDER_SITE_OTHER): Payer: Medicare Other | Admitting: Pulmonary Disease

## 2018-12-12 ENCOUNTER — Encounter: Payer: Self-pay | Admitting: Pulmonary Disease

## 2018-12-12 DIAGNOSIS — G4733 Obstructive sleep apnea (adult) (pediatric): Secondary | ICD-10-CM

## 2018-12-12 NOTE — Progress Notes (Signed)
Reviewed and agree with assessment/plan.   Bonnee Zertuche, MD [facility] 02/23/2016, 12:24 PM Pager:  [phone]  

## 2018-12-12 NOTE — Assessment & Plan Note (Signed)
Plan:  Continue CPAP nightly  Continue So Clean  If you travel to [place] use old cpap  Use it with naps  Continue to work on increasing overall physical activity Work to reduce your BMI

## 2018-12-12 NOTE — Patient Instructions (Addendum)
.  You were seen today by Lauraine Rinne, NP  for:   1. OSA (obstructive sleep apnea)  Continue CPAP  >>>Supplies for 1 year   We recommend that you continue using your CPAP daily >>>Keep up the hard work using your device >>> Goal should be wearing this for the entire night that you are sleeping, at least 4 to 6 hours  Remember:  . Do not drive or operate heavy machinery if tired or drowsy.  . Please notify the supply company and office if you are unable to use your device regularly due to missing supplies or machine being broken.  . Work on maintaining a healthy weight and following your recommended nutrition plan  . Maintain proper daily exercise and movement  . Maintaining proper use of your device can also help improve management of other chronic illnesses such as: Blood pressure, blood sugars, and weight management.   BiPAP/ CPAP Cleaning:  >>>Clean weekly, with Dawn soap, and bottle brush.  Set up to air dry.   Follow Up:    Return in about 1 year (around 12/12/2019), or if symptoms worsen or fail to improve, for Follow up with Dr. Halford Chessman.   Please do your part to reduce the spread of COVID-19:      Reduce your risk of any infection  and COVID19 by using the similar precautions used for avoiding the common cold or flu:  Marland Kitchen Wash your hands often with soap and warm water for at least 20 seconds.  If soap and water are not readily available, use an alcohol-based hand sanitizer with at least 60% alcohol.  . If coughing or sneezing, cover your mouth and nose by coughing or sneezing into the elbow areas of your shirt or coat, into a tissue or into your sleeve (not your hands). Langley Gauss A MASK when in public  . Avoid shaking hands with others and consider head nods or verbal greetings only. . Avoid touching your eyes, nose, or mouth with unwashed hands.  . Avoid close contact with people who are sick. . Avoid places or events with large numbers of people in one location, like concerts  or sporting events. . If you have some symptoms but not all symptoms, continue to monitor at home and seek medical attention if your symptoms worsen. . If you are having a medical emergency, call 911.   [facility] / e-Visit: [email or web address]         [facility]: [facility]: [id number]                   [facility]: [id number]     It is flu season:   >>> Best ways to protect herself from the flu: Receive the yearly flu vaccine, practice good hand hygiene washing with soap and also using hand sanitizer when available, eat a nutritious meals, get adequate rest, hydrate appropriately   Please contact the office if your symptoms worsen or you have concerns that you are not improving.   Thank you for choosing [facility] for your healthcare, and for allowing [name] to partner with you on your healthcare journey. I am thankful to be able to provide care to you today.   Wyn Quaker FNP-C

## 2018-12-16 ENCOUNTER — Other Ambulatory Visit (HOSPITAL_COMMUNITY): Payer: Self-pay

## 2018-12-16 ENCOUNTER — Telehealth: Payer: Self-pay | Admitting: Internal Medicine

## 2018-12-16 DIAGNOSIS — R1032 Left lower quadrant pain: Secondary | ICD-10-CM

## 2018-12-16 DIAGNOSIS — D508 Other iron deficiency anemias: Secondary | ICD-10-CM

## 2018-12-16 DIAGNOSIS — D500 Iron deficiency anemia secondary to blood loss (chronic): Secondary | ICD-10-CM

## 2018-12-16 NOTE — Telephone Encounter (Signed)
[phone] please call patient, she wanted to speak to you about a possible solution for the issues she is having.

## 2018-12-16 NOTE — Telephone Encounter (Signed)
Spoke with patient regarding symptoms. She has been having LLQ pain intermittent for one year. Symptoms more persistent. Stabbing quality. Wakes her up at night. No N/V. Not worsened with meals or improved with BM. Intentional weight loss of 40 pounds but no improvement in pain. She has prior history of [place], uterus had to be taken out in pieces due to "it crumbled". Took 5 hours. 10 years later, they thought she had ovarian cancer but turned out to be benign growths on both ovaries. She wants imaging to evaluate her LLQ pain.  She also has constipation in setting of high protein diet. miralax daily is not helping effectively.   Discussed with RMR.  Can we please schedule patient for CT A/P with contrast for one year h/o LLQ pain?  She will need creatinine.

## 2018-12-17 ENCOUNTER — Other Ambulatory Visit: Payer: Self-pay

## 2018-12-17 ENCOUNTER — Inpatient Hospital Stay (HOSPITAL_COMMUNITY): Payer: Medicare Other | Attending: Hematology

## 2018-12-17 DIAGNOSIS — K929 Disease of digestive system, unspecified: Secondary | ICD-10-CM | POA: Diagnosis not present

## 2018-12-17 DIAGNOSIS — D500 Iron deficiency anemia secondary to blood loss (chronic): Secondary | ICD-10-CM

## 2018-12-17 DIAGNOSIS — K909 Intestinal malabsorption, unspecified: Secondary | ICD-10-CM | POA: Insufficient documentation

## 2018-12-17 DIAGNOSIS — D509 Iron deficiency anemia, unspecified: Secondary | ICD-10-CM | POA: Diagnosis not present

## 2018-12-17 DIAGNOSIS — E119 Type 2 diabetes mellitus without complications: Secondary | ICD-10-CM | POA: Insufficient documentation

## 2018-12-17 LAB — COMPREHENSIVE METABOLIC PANEL
ALT: 20 U/L (ref 0–44)
AST: 17 U/L (ref 15–41)
Albumin: 4 g/dL (ref 3.5–5.0)
Alkaline Phosphatase: 52 U/L (ref 38–126)
Anion gap: 8 (ref 5–15)
BUN: 19 mg/dL (ref 8–23)
CO2: 28 mmol/L (ref 22–32)
Calcium: 9.4 mg/dL (ref 8.9–10.3)
Chloride: 104 mmol/L (ref 98–111)
Creatinine, Ser: 0.64 mg/dL (ref 0.44–1.00)
GFR calc Af Amer: >60 mL/min (ref >60)
GFR calc non Af Amer: >60 mL/min (ref >60)
Glucose, Bld: 97 mg/dL (ref 70–99)
Potassium: 4.3 mmol/L (ref 3.5–5.1)
Sodium: 140 mmol/L (ref 135–145)
Total Bilirubin: 0.9 mg/dL (ref 0.3–1.2)
Total Protein: 7.1 g/dL (ref 6.5–8.1)

## 2018-12-17 LAB — LACTATE DEHYDROGENASE: LDH: 135 U/L (ref 98–192)

## 2018-12-17 LAB — CBC WITH DIFFERENTIAL/PLATELET
Abs Immature Granulocytes: 0.01 10*3/uL (ref 0.00–0.07)
Basophils Absolute: 0 10*3/uL (ref 0.0–0.1)
Basophils Relative: 1 %
Eosinophils Absolute: 0.1 10*3/uL (ref 0.0–0.5)
Eosinophils Relative: 2 %
HCT: 42 % (ref 36.0–46.0)
Hemoglobin: 13 g/dL (ref 12.0–15.0)
Immature Granulocytes: 0 %
Lymphocytes Relative: 32 %
Lymphs Abs: 1.4 10*3/uL (ref 0.7–4.0)
MCH: 28 pg (ref 26.0–34.0)
MCHC: 31 g/dL (ref 30.0–36.0)
MCV: 90.5 fL (ref 80.0–100.0)
Monocytes Absolute: 0.3 10*3/uL (ref 0.1–1.0)
Monocytes Relative: 7 %
Neutro Abs: 2.5 10*3/uL (ref 1.7–7.7)
Neutrophils Relative %: 58 %
Platelets: 193 10*3/uL (ref 150–400)
RBC: 4.64 MIL/uL (ref 3.87–5.11)
RDW: 13 % (ref 11.5–15.5)
WBC: 4.3 10*3/uL (ref 4.0–10.5)
nRBC: 0 % (ref 0.0–0.2)

## 2018-12-17 LAB — FERRITIN: Ferritin: 87 ng/mL (ref 11–307)

## 2018-12-17 NOTE — Addendum Note (Signed)
Addended by: Hassan Rowan on: 12/17/2018 10:47 AM   Modules accepted: Orders

## 2018-12-17 NOTE — Telephone Encounter (Signed)
CT abd/pelvis scheduled for 12/31/18 at 8:00am. NPO 4 hours prior to test. Pick up contrast prior to test. Needs creatinine.  Called pt, informed of appt and details. Pt states she is having labs drawn today that were ordered by cancer center. After review of chart, she has CMET ordered. Advised pt those results can be used for CT. Creatinine order canceled. Appt letter mailed.

## 2018-12-17 NOTE — Telephone Encounter (Signed)
Noted. Thanks.

## 2018-12-24 ENCOUNTER — Other Ambulatory Visit: Payer: Self-pay

## 2018-12-24 ENCOUNTER — Encounter (HOSPITAL_COMMUNITY): Payer: Self-pay | Admitting: Hematology

## 2018-12-24 ENCOUNTER — Inpatient Hospital Stay (HOSPITAL_BASED_OUTPATIENT_CLINIC_OR_DEPARTMENT_OTHER): Payer: Medicare Other | Admitting: Hematology

## 2018-12-24 ENCOUNTER — Other Ambulatory Visit: Payer: Self-pay | Admitting: *Deleted

## 2018-12-24 DIAGNOSIS — D509 Iron deficiency anemia, unspecified: Secondary | ICD-10-CM | POA: Diagnosis not present

## 2018-12-24 DIAGNOSIS — D508 Other iron deficiency anemias: Secondary | ICD-10-CM | POA: Diagnosis not present

## 2018-12-24 DIAGNOSIS — Z23 Encounter for immunization: Secondary | ICD-10-CM | POA: Diagnosis not present

## 2018-12-24 DIAGNOSIS — Z20828 Contact with and (suspected) exposure to other viral communicable diseases: Secondary | ICD-10-CM | POA: Diagnosis not present

## 2018-12-24 DIAGNOSIS — Z20822 Contact with and (suspected) exposure to covid-19: Secondary | ICD-10-CM

## 2018-12-25 LAB — NOVEL CORONAVIRUS, NAA: SARS-CoV-2, NAA: NOT DETECTED

## 2018-12-31 ENCOUNTER — Ambulatory Visit (HOSPITAL_COMMUNITY)
Admission: RE | Admit: 2018-12-31 | Discharge: 2018-12-31 | Disposition: A | Discharge status: Home / Self Care | Payer: Medicare Other | Source: Ambulatory Visit | Attending: Gastroenterology | Admitting: Gastroenterology

## 2018-12-31 ENCOUNTER — Other Ambulatory Visit: Payer: Self-pay

## 2018-12-31 DIAGNOSIS — R1032 Left lower quadrant pain: Secondary | ICD-10-CM

## 2018-12-31 DIAGNOSIS — K5730 Diverticulosis of large intestine without perforation or abscess without bleeding: Secondary | ICD-10-CM | POA: Diagnosis not present

## 2018-12-31 MED ORDER — IOHEXOL 300 MG/ML  SOLN
100.0000 mL | Freq: Once | INTRAMUSCULAR | Status: AC | PRN
  Administered 2018-12-31: 100 mL via INTRAVENOUS

## 2019-01-15 ENCOUNTER — Telehealth: Payer: Self-pay | Admitting: Orthopedic Surgery

## 2019-01-15 ENCOUNTER — Other Ambulatory Visit: Payer: Self-pay

## 2019-01-15 ENCOUNTER — Ambulatory Visit (HOSPITAL_COMMUNITY)
Admission: RE | Admit: 2019-01-15 | Discharge: 2019-01-15 | Disposition: A | Discharge status: Home / Self Care | Payer: Medicare Other | Source: Ambulatory Visit | Attending: Orthopedic Surgery | Admitting: Orthopedic Surgery

## 2019-01-15 DIAGNOSIS — G44319 Acute post-traumatic headache, not intractable: Secondary | ICD-10-CM

## 2019-01-15 DIAGNOSIS — S0083XA Contusion of other part of head, initial encounter: Secondary | ICD-10-CM | POA: Diagnosis not present

## 2019-01-15 NOTE — Telephone Encounter (Signed)
Spoke to Dr Aline Brochure, he states ok for CT head, have placed order, worked in for appointment and called with results.

## 2019-01-15 NOTE — Telephone Encounter (Signed)
Dr Luna Glasgow called and Debra Harris fell hit head yesterday, now with dizziness nausea signs of concussion  She has been in contact with a neurosurgeon in [place], he recommended CT head for her, stat  Can you order this for her, please? I will schedule and call her with appointment

## 2019-01-17 ENCOUNTER — Ambulatory Visit: Payer: Medicare Other | Admitting: Internal Medicine

## 2019-01-21 ENCOUNTER — Ambulatory Visit (INDEPENDENT_AMBULATORY_CARE_PROVIDER_SITE_OTHER): Payer: Medicare Other | Admitting: Internal Medicine

## 2019-01-21 ENCOUNTER — Encounter: Payer: Self-pay | Admitting: Internal Medicine

## 2019-01-21 ENCOUNTER — Other Ambulatory Visit: Payer: Self-pay

## 2019-01-21 VITALS — BP 140/75 | HR 59 | Temp 98.2°F | Ht 63.0 in | Wt 190.8 lb

## 2019-01-21 DIAGNOSIS — K5909 Other constipation: Secondary | ICD-10-CM | POA: Diagnosis not present

## 2019-01-21 NOTE — Progress Notes (Signed)
Primary Care Physician:  Asencion Noble, MD Primary Gastroenterologist:  Dr. Gala Romney  Pre-Procedure History & Physical: HPI:  Debra Harris is a 68 y.o. female here for follow-up of chronic left lower quadrant abdominal pain in the setting of constipation.  Has been constipated for several years.  May go a week or more without a bowel movement in spite of using MiraLAX once daily and stool softeners.  Use Benefiber previously sporadically.  She has significant diverticulosis on CT and prior colonoscopy.  Recently had a CT for left lower quadrant abdominal pain  -   prominent diverticulosis but no inflammatory lesions or other concerning findings. Diet has been modified quite a bit over the last year or so going from more or less protein.  She was able to lose quite a bit of weight.  She is uncomfortable a good bit of the time because of constipation.  There is been no bleeding.  She has a history of hemorrhoids and has had multiple procedures.  Saw Dr. Arnoldo Morale last year felt that no further intervention was warranted.  When she moves her bowels well,  hemorrhoids tend not to be an issue. History of iron deficiency resolved.  History of NSAID use.  History of erosions seen on small bowel capsule study previously.   Past Medical History:  Diagnosis Date  . Adenomatous colon polyp   . Allergic rhinitis   . Arthritis   . Back pain   . Constipation   . Diabetes mellitus   . Diverticulosis   . External hemorrhoids   . Fibromyalgia   . Gallbladder problem   . GERD (gastroesophageal reflux disease)   . Glaucoma   . Glaucoma   . Hemorrhoids    grade 3   . Hip pain   . IBS (irritable bowel syndrome)   . IDA (iron deficiency anemia)   . Iron deficiency anemia 06/05/2014  . Irregular heartbeat   . Leg edema   . Leg numbness   . Leg pain   . Low tension glaucoma   . Neuropathy, leg   . Ovarian cyst 11/20/00  . Prediabetes   . Sleep apnea    CPAP  . Sliding hiatal hernia     Past  Surgical History:  Procedure Laterality Date  . CARPAL TUNNEL RELEASE Bilateral   . CATARACT EXTRACTION     bilateral  . CHOLECYSTECTOMY    . COLONOSCOPY  2005   Small external hemorrhoids, single sigmoid colon diverticulum, small polyp at the hepatic flexure focally adenomatous.  . COLONOSCOPY  2010   Dr. Gala Romney, Versed 4/Demerol 75. Pancolonic diverticula, external hemorrhoids. Colonoscopy every 5 years for history of adenomatous polyps  . COLONOSCOPY  2014   Dr. Olevia Perches: Propofol. mild diverticulosis in the sigmoid colon, random colon biopsies benign.  . COLONOSCOPY WITH PROPOFOL N/A 09/13/2015   Procedure: COLONOSCOPY WITH PROPOFOL;  Surgeon: Daneil Dolin, MD;  Location: [facility];  Service: Endoscopy;  Laterality: N/A;  1030  . ESOPHAGEAL DILATION N/A 10/26/2014   Procedure: ESOPHAGEAL DILATION [place];  Surgeon: Daneil Dolin, MD;  Location: [facility];  Service: Endoscopy;  Laterality: N/A;  . ESOPHAGOGASTRODUODENOSCOPY  2010   Dr. Gala Romney, distal esophageal erosions, Venia Minks dilator passed. Comment small hiatal hernia, patulous EG junction.  . ESOPHAGOGASTRODUODENOSCOPY (EGD) WITH PROPOFOL N/A 10/26/2014   Dr.- distal esophageal erosions c/w mild erosive reflux esophagitis. hiatal hernia, o/w normal EGD  . ESOPHAGOGASTRODUODENOSCOPY (EGD) WITH PROPOFOL N/A 09/13/2015   Procedure: ESOPHAGOGASTRODUODENOSCOPY (EGD) WITH  PROPOFOL;  Surgeon: Daneil Dolin, MD;  Location: [facility];  Service: Endoscopy;  Laterality: N/A;  . GIVENS CAPSULE STUDY N/A 10/26/2014   couple of mucosal breaks/erosions but essentially unremarkable small bowel study  . HEMORRHOID BANDING    . KNEE SURGERY     right-arthroscopy  . OOPHORECTOMY  age 78s  . PARTIAL HYSTERECTOMY  age 70s  . RECTAL EXAM UNDER ANESTHESIA N/A 10/26/2014   circumferential grade 3 hemoorrhoids easily reducible  . thumb surger Bilateral    trigger thumb    Prior to Admission medications   Medication Sig Start Date End Date  Taking? Authorizing Provider  acetaminophen (TYLENOL) 500 MG tablet Take 500 mg by mouth every 6 (six) hours as needed.   Yes [provider]  bisacodyl (DULCOLAX) 5 MG EC tablet Take 5 mg by mouth daily as needed for moderate constipation.   Yes [provider]  cetirizine (ZYRTEC) 10 MG tablet Take 10 mg by mouth daily.    Yes [provider]  HYDROcodone-acetaminophen (NORCO/VICODIN) 5-325 MG tablet Take 1 tablet by mouth every 6 (six) hours as needed for moderate pain. 06/26/18  Yes Carole Civil, MD  latanoprost (XALATAN) 0.005 % ophthalmic solution  06/27/18  Yes [provider]  naproxen sodium (ALEVE) 220 MG tablet Take 220-440 mg by mouth daily as needed.   Yes [provider]  NON FORMULARY CPAP at night   Yes [provider]  polyethylene glycol (MIRALAX / GLYCOLAX) 17 g packet Take 17 g by mouth daily.   Yes [provider]    Allergies as of 01/21/2019 - Review Complete 01/21/2019  Allergen Reaction Noted  . Injectafer [ferric carboxymaltose]  06/14/2016    Family History  Problem Relation Age of Onset  . Ulcers Mother   . Glaucoma Mother   . Heart disease Mother   . Diabetes Mother   . Depression Mother   . Sudden death Mother   . Obesity Mother   . Glaucoma Maternal Grandmother   . Heart disease Maternal Grandmother   . Heart disease Maternal Grandfather   . Colon cancer Neg Hx     Social History   Socioeconomic History  . Marital status: Married    Spouse name: J. Orland Dec, MD  . Number of children: 3  . Years of education: Not on file  . Highest education level: Not on file  Occupational History  . Occupation: AUDIOLOGIST, retired  Scientific laboratory technician  . Financial resource strain: Not on file  . Food insecurity    Worry: Not on file    Inability: Not on file  . Transportation needs    Medical: Not on file    Non-medical: Not on file  Tobacco Use  . Smoking status: Never Smoker  .  Smokeless tobacco: Never Used  . Tobacco comment: Pt reports tried once in 7th grade//ms  Substance and Sexual Activity  . Alcohol use: Not Currently    Alcohol/week: 1.0 standard drinks    Types: 1 Glasses of wine per week    Comment: rare  . Drug use: No  . Sexual activity: Never    Birth control/protection: None  Lifestyle  . Physical activity    Days per week: Not on file    Minutes per session: Not on file  . Stress: Not on file  Relationships  . Social Herbalist on phone: Not on file    Gets together: Not on file  Attends religious service: Not on file    Active member of club or organization: Not on file    Attends meetings of clubs or organizations: Not on file    Relationship status: Not on file  . Intimate partner violence    Fear of current or ex partner: Not on file    Emotionally abused: Not on file    Physically abused: Not on file    Forced sexual activity: Not on file  Other Topics Concern  . Not on file  Social History Narrative   3 children & married to orthopedist. 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    Review of Systems: See HPI, otherwise negative ROS  Physical Exam: BP 140/75   Pulse (!) 59   Temp 98.2 F (36.8 C) (Oral)   Ht 5\' 3"  (1.6 m)   Wt 190 lb 12.8 oz (86.5 kg)   BMI 33.80 kg/m  General:   Alert,  pleasant and cooperative in NAD Abdomen: Non-distended, normal bowel sounds.  Soft and nontender without appreciable mass or hepatosplenomegaly.    Impression/Plan: 68 year old lady with chronic constipation/left lower quadrant abdominal pain.  Colonoscopy up-to-date.  Recent CT findings reassuring.  She likely has functional constipation, in part, exacerbated by wide swings in dietary protein intake.  We discussed a reasonable goal would to achieve 1 bowel movement every other day or 3 times a week which would  be considered normal.. There will likely be modification in our recommendations going forward to reach goal.   Recommendations:  Stop miralax and stool softener  Begin Linzess 145 daily -samples provided  Begin Benefiber 1 tablespoon daily x 3 weeks; then increase to 2 daily thereafter  Call in 3 weeks with progress report  Office visit wit me in 3 months   Notice: This dictation was prepared with Dragon dictation along with smaller phrase technology. Any transcriptional errors that result from this process are unintentional and may not be corrected upon review.

## 2019-01-21 NOTE — Patient Instructions (Addendum)
Stop miralax and stool softener  Begin Linzess 145 daily  Begin Benefiber 1 tablespoon daily x 3 weeks; then increase to 2 daily thereafter  Call me in 3 weeks with progress report  Office visit wit me in 3 months

## 2019-01-28 ENCOUNTER — Other Ambulatory Visit: Payer: Self-pay

## 2019-01-28 ENCOUNTER — Encounter (INDEPENDENT_AMBULATORY_CARE_PROVIDER_SITE_OTHER): Payer: Self-pay | Admitting: Family Medicine

## 2019-01-28 ENCOUNTER — Telehealth (INDEPENDENT_AMBULATORY_CARE_PROVIDER_SITE_OTHER): Payer: Medicare Other | Admitting: Family Medicine

## 2019-01-28 DIAGNOSIS — R7303 Prediabetes: Secondary | ICD-10-CM

## 2019-01-28 DIAGNOSIS — E559 Vitamin D deficiency, unspecified: Secondary | ICD-10-CM | POA: Diagnosis not present

## 2019-01-28 DIAGNOSIS — E669 Obesity, unspecified: Secondary | ICD-10-CM

## 2019-01-28 DIAGNOSIS — Z6830 Body mass index (BMI) 30.0-30.9, adult: Secondary | ICD-10-CM

## 2019-01-28 DIAGNOSIS — E7849 Other hyperlipidemia: Secondary | ICD-10-CM | POA: Diagnosis not present

## 2019-01-28 DIAGNOSIS — G4733 Obstructive sleep apnea (adult) (pediatric): Secondary | ICD-10-CM

## 2019-01-28 NOTE — Progress Notes (Signed)
Office: [phone]  /  Fax: [phone] TeleHealth Visit:  Debra Harris has verbally consented to this TeleHealth visit today. The patient is located at home, the provider is located at the [facility] office. The participants in this visit include the listed provider and patient. The visit was conducted today via telephone call (FaceTime failed - changed to telephone call).  HPI:   Chief Complaint: OBESITY Debra Harris is here to discuss her progress with her obesity treatment plan. She is on the Category 2 plan and is following her eating plan approximately 0% of the time. She states she is exercising 0 minutes 0 times per week. Kenlea's last in-office visit was approximately 3 months ago. She feels she has gained about 15 lbs due to getting off track with her eating. She states she is ready to get back on track. She notes some increased sabotage from her husband. We were unable to weigh the patient today for this TeleHealth visit. She feels as if she has gained weight since her last visit. She has lost 29 lbs since starting treatment with [name].  Pre-Diabetes Debra Harris has a diagnosis of prediabetes based on her elevated Hgb A1c and was informed this puts her at greater risk of developing diabetes. She is not taking metformin currently. She is working on diet and is overdue to have labs drawn. She has struggled recently with weight gain, but is ready to get back on track. She denies nausea or hypoglycemia.  Hyperlipidemia Debra Harris has hyperlipidemia. She has been trying to improve her cholesterol levels with intensive lifestyle modification including a low saturated fat diet, exercise and weight loss. She is not on a statin. She denies any chest pain, claudication or myalgias.  Vitamin D deficiency Debra Harris has a diagnosis of Vitamin D deficiency. She is no longer taking Vit D and denies nausea, vomiting or muscle weakness. She is due to have her labs rechecked.  ASSESSMENT AND PLAN:   Prediabetes - Plan: Comprehensive metabolic panel, Hemoglobin A1c, Insulin, random  Other hyperlipidemia - Plan: Lipid Panel With LDL/HDL Ratio  Vitamin D deficiency - Plan: Vitamin D (25 hydroxy)  Class 1 obesity with serious comorbidity and body mass index (BMI) of 30.0 to 30.9 in adult, unspecified obesity type  PLAN:  Pre-Diabetes Debra Harris will continue to work on weight loss, exercise, and decreasing simple carbohydrates in her diet to help decrease the risk of diabetes. We dicussed metformin including benefits and risks. She was informed that eating too many simple carbohydrates or too many calories at one sitting increases the likelihood of GI side effects. Blu will have labs checked and follow-up as directed for review of lab results.  Hyperlipidemia Debra Harris was informed of the American Heart Association Guidelines emphasizing intensive lifestyle modifications as the first line treatment for hyperlipidemia. We discussed many lifestyle modifications today in depth, and Dayva will continue to work on decreasing saturated fats such as fatty red meat, butter and many fried foods.  Debra Harris will have labs checked. She will also increase vegetables and lean protein in her diet and continue to work on exercise and weight loss efforts.  Vitamin D Deficiency Debra Harris was informed that low Vitamin D levels contributes to fatigue and are associated with obesity, breast, and colon cancer. She will have routine testing of Vitamin D and follow-up with our clinic in 4 weeks.  I spent > than 50% of the 20 minute visit on counseling as documented in the note.  TIME SPENT: 28 minutes  Obesity  Debra Harris is currently in the action stage of change. As such, her goal is to continue with weight loss efforts. She has agreed to follow the Category 2 plan. Madeeha has been instructed to work up to a goal of 150 minutes of combined cardio and strengthening exercise per week for weight loss and overall health  benefits. We discussed the following Behavioral Modification Strategies today: work on meal planning and easy cooking plans, and dealing with family or coworker sabotage.  Debra Harris has agreed to follow-up with our clinic in 4 weeks. She was informed of the importance of frequent follow-up visits to maximize her success with intensive lifestyle modifications for her multiple health conditions.  ALLERGIES: Allergies  Allergen Reactions  . Injectafer [Ferric Carboxymaltose]     Has tolerated feraheme without problems    MEDICATIONS: Current Outpatient Medications on File Prior to Visit  Medication Sig Dispense Refill  . acetaminophen (TYLENOL) 500 MG tablet Take 500 mg by mouth every 6 (six) hours as needed.    . bisacodyl (DULCOLAX) 5 MG EC tablet Take 5 mg by mouth daily as needed for moderate constipation.    . cetirizine (ZYRTEC) 10 MG tablet Take 10 mg by mouth daily.     Marland Kitchen HYDROcodone-acetaminophen (NORCO/VICODIN) 5-325 MG tablet Take 1 tablet by mouth every 6 (six) hours as needed for moderate pain. 60 tablet 0  . latanoprost (XALATAN) 0.005 % ophthalmic solution     . linaclotide (LINZESS) 145 MCG CAPS capsule Take 145 mcg by mouth daily before breakfast.    . naproxen sodium (ALEVE) 220 MG tablet Take 220-440 mg by mouth daily as needed.    . NON FORMULARY CPAP at night    . Wheat Dextrin (BENEFIBER) POWD Take by mouth daily.     No current facility-administered medications on file prior to visit.     PAST MEDICAL HISTORY: Past Medical History:  Diagnosis Date  . Adenomatous colon polyp   . Allergic rhinitis   . Arthritis   . Back pain   . Constipation   . Diabetes mellitus   . Diverticulosis   . External hemorrhoids   . Fibromyalgia   . Gallbladder problem   . GERD (gastroesophageal reflux disease)   . Glaucoma   . Glaucoma   . Hemorrhoids    grade 3   . Hip pain   . IBS (irritable bowel syndrome)   . IDA (iron deficiency anemia)   . Iron deficiency anemia  06/05/2014  . Irregular heartbeat   . Leg edema   . Leg numbness   . Leg pain   . Low tension glaucoma   . Neuropathy, leg   . Ovarian cyst 11/20/00  . Prediabetes   . Sleep apnea    CPAP  . Sliding hiatal hernia     PAST SURGICAL HISTORY: Past Surgical History:  Procedure Laterality Date  . CARPAL TUNNEL RELEASE Bilateral   . CATARACT EXTRACTION     bilateral  . CHOLECYSTECTOMY    . COLONOSCOPY  2005   Small external hemorrhoids, single sigmoid colon diverticulum, small polyp at the hepatic flexure focally adenomatous.  . COLONOSCOPY  2010   Dr. Gala Romney, Versed 4/Demerol 75. Pancolonic diverticula, external hemorrhoids. Colonoscopy every 5 years for history of adenomatous polyps  . COLONOSCOPY  2014   Dr. Olevia Perches: Propofol. mild diverticulosis in the sigmoid colon, random colon biopsies benign.  . COLONOSCOPY WITH PROPOFOL N/A 09/13/2015   Procedure: COLONOSCOPY WITH PROPOFOL;  Surgeon: Daneil Dolin, MD;  [facility];  Service: Endoscopy;  Laterality: N/A;  1030  . ESOPHAGEAL DILATION N/A 10/26/2014   Procedure: ESOPHAGEAL DILATION [place];  Surgeon: Daneil Dolin, MD;  Location: [facility];  Service: Endoscopy;  Laterality: N/A;  . ESOPHAGOGASTRODUODENOSCOPY  2010   Dr. Gala Romney, distal esophageal erosions, Venia Minks dilator passed. Comment small hiatal hernia, patulous EG junction.  . ESOPHAGOGASTRODUODENOSCOPY (EGD) WITH PROPOFOL N/A 10/26/2014   Dr.Rourk- distal esophageal erosions c/w mild erosive reflux esophagitis. hiatal hernia, o/w normal EGD  . ESOPHAGOGASTRODUODENOSCOPY (EGD) WITH PROPOFOL N/A 09/13/2015   Procedure: ESOPHAGOGASTRODUODENOSCOPY (EGD) WITH PROPOFOL;  Surgeon: Daneil Dolin, MD;  [facility];  Service: Endoscopy;  Laterality: N/A;  . GIVENS CAPSULE STUDY N/A 10/26/2014   couple of mucosal breaks/erosions but essentially unremarkable small bowel study  . HEMORRHOID BANDING    . KNEE SURGERY     right-arthroscopy  . OOPHORECTOMY  age 40s  .  PARTIAL HYSTERECTOMY  age 74s  . RECTAL EXAM UNDER ANESTHESIA N/A 10/26/2014   circumferential grade 3 hemoorrhoids easily reducible  . thumb surger Bilateral    trigger thumb    SOCIAL HISTORY: Social History   Tobacco Use  . Smoking status: Never Smoker  . Smokeless tobacco: Never Used  . Tobacco comment: Pt reports tried once in 7th grade//ms  Substance Use Topics  . Alcohol use: Not Currently    Alcohol/week: 1.0 standard drinks    Types: 1 Glasses of wine per week    Comment: rare  . Drug use: No    FAMILY HISTORY: Family History  Problem Relation Age of Onset  . Ulcers Mother   . Glaucoma Mother   . Heart disease Mother   . Diabetes Mother   . Depression Mother   . Sudden death Mother   . Obesity Mother   . Glaucoma Maternal Grandmother   . Heart disease Maternal Grandmother   . Heart disease Maternal Grandfather   . Colon cancer Neg Hx    ROS: Review of Systems  Cardiovascular: Negative for chest pain and claudication.  Gastrointestinal: Negative for nausea and vomiting.  Musculoskeletal: Negative for myalgias.       Negative for muscle weakness.  Endo/Heme/Allergies:       Negative for hypoglycemia.   PHYSICAL EXAM: Pt in no acute distress  RECENT LABS AND TESTS: BMET    Component Value Date/Time   NA 140 12/17/2018 1107   NA 144 03/19/2018   K 4.3 12/17/2018 1107   CL 104 12/17/2018 1107   CO2 28 12/17/2018 1107   GLUCOSE 97 12/17/2018 1107   BUN 19 12/17/2018 1107   BUN 24 (A) 03/19/2018   CREATININE 0.64 12/17/2018 1107   CALCIUM 9.4 12/17/2018 1107   GFRNONAA >60 12/17/2018 1107   GFRAA >60 12/17/2018 1107   Lab Results  Component Value Date   HGBA1C 5.7 03/19/2018   HGBA1C 5.6 01/08/2018   HGBA1C 6.0 11/16/2017   HGBA1C 5.5 07/09/2017   Lab Results  Component Value Date   INSULIN 10.4 01/08/2018   INSULIN 21.9 07/25/2017   CBC    Component Value Date/Time   WBC 4.3 12/17/2018 1107   RBC 4.64 12/17/2018 1107   HGB 13.0  12/17/2018 1107   HCT 42.0 12/17/2018 1107   PLT 193 12/17/2018 1107   MCV 90.5 12/17/2018 1107   MCH 28.0 12/17/2018 1107   MCHC 31.0 12/17/2018 1107   RDW 13.0 12/17/2018 1107   LYMPHSABS 1.4 12/17/2018 1107   MONOABS 0.3 12/17/2018 1107  EOSABS 0.1 12/17/2018 1107   BASOSABS 0.0 12/17/2018 1107   Iron/TIBC/Ferritin/ %Sat    Component Value Date/Time   IRON 76 10/08/2017 1050   TIBC 389 10/08/2017 1050   FERRITIN 87 12/17/2018 1107   IRONPCTSAT 20 10/08/2017 1050   Lipid Panel     Component Value Date/Time   CHOL 162 03/19/2018   CHOL 179 01/08/2018 1555   TRIG 70 03/19/2018   HDL 46 03/19/2018   HDL 42 01/08/2018 1555   LDLCALC 102 03/19/2018   LDLCALC 121 (H) 01/08/2018 1555   Hepatic Function Panel     Component Value Date/Time   PROT 7.1 12/17/2018 1107   PROT 7.0 01/08/2018 1555   ALBUMIN 4.0 12/17/2018 1107   ALBUMIN 4.2 01/08/2018 1555   AST 17 12/17/2018 1107   ALT 20 12/17/2018 1107   ALKPHOS 52 12/17/2018 1107   BILITOT 0.9 12/17/2018 1107   BILITOT 0.8 01/08/2018 1555      Component Value Date/Time   TSH 0.718 07/25/2017 1239   TSH 0.71 10/22/2012 1030   Results for SOMALY, JOCHEM D "SANDY" (MRN [id number]) as of 01/28/2019 13:40  Ref. Range 01/08/2018 15:55  Vitamin D, 25-Hydroxy Latest Ref Range: 30.0 - 100.0 ng/mL 37.7   I, Michaelene Song, am acting as Location manager for Dennard Nip, MD I have reviewed the above documentation for accuracy and completeness, and I agree with the above. -Dennard Nip, MD

## 2019-02-03 DIAGNOSIS — H04123 Dry eye syndrome of bilateral lacrimal glands: Secondary | ICD-10-CM | POA: Diagnosis not present

## 2019-02-03 DIAGNOSIS — H401132 Primary open-angle glaucoma, bilateral, moderate stage: Secondary | ICD-10-CM | POA: Diagnosis not present

## 2019-02-03 DIAGNOSIS — Z961 Presence of intraocular lens: Secondary | ICD-10-CM | POA: Diagnosis not present

## 2019-02-11 ENCOUNTER — Other Ambulatory Visit: Payer: Medicare Other

## 2019-02-13 NOTE — Assessment & Plan Note (Signed)
1.  Iron deficiency anemia: - Colonoscopy on 09/13/2015 showing diverticuli in sigmoid colon, descending colon and transverse colon, internal and external hemorrhoids banded -EGD on 09/13/2015 showing normal esophagus, small hiatal hernia - Feraheme infusion in January and February 2019 - Iron deficiency state from a combination of malabsorption and chronic GI loss - Last Feraheme infusion: February 2020 -Hemoglobin and ferritin levels remain stable.  We will continue to monitor. -She will return to clinic in 1 year or sooner if needed.

## 2019-02-13 NOTE — Progress Notes (Signed)
[address] [place], [place] [address]   CLINIC:  Medical Oncology/Hematology  PCP:  Asencion Noble, MD [address] [place] [address] [phone]   REASON FOR VISIT:  Follow-up for Iron deficiency anemia   CURRENT THERAPY: clinical surveillance   INTERVAL HISTORY:  Debra Harris 68 y.o. female presents today for follow up. Reports over all doing well.  She denies any significant fatigue.  Denies any obvious signs of bleeding.  No change in bowel habits.  Appetite is stable.  No weight loss.  Denies any fevers, chills, night sweats.  Denies any shortness of breath, lightheadedness or dizziness.  She is here for repeat labs and office visit.   REVIEW OF SYSTEMS:  Review of Systems  Constitutional: Negative.   HENT:  Negative.   Eyes: Negative.   Respiratory: Negative.   Cardiovascular: Negative.   Gastrointestinal: Negative.   Endocrine: Negative.   Genitourinary: Negative.    Musculoskeletal: Positive for arthralgias.  Skin: Negative.   Neurological: Negative.   Hematological: Negative.   Psychiatric/Behavioral: Negative.      PAST MEDICAL/SURGICAL HISTORY:  Past Medical History:  Diagnosis Date  . Adenomatous colon polyp   . Allergic rhinitis   . Arthritis   . Back pain   . Constipation   . Diabetes mellitus   . Diverticulosis   . External hemorrhoids   . Fibromyalgia   . Gallbladder problem   . GERD (gastroesophageal reflux disease)   . Glaucoma   . Glaucoma   . Hemorrhoids    grade 3   . Hip pain   . IBS (irritable bowel syndrome)   . IDA (iron deficiency anemia)   . Iron deficiency anemia 06/05/2014  . Irregular heartbeat   . Leg edema   . Leg numbness   . Leg pain   . Low tension glaucoma   . Neuropathy, leg   . Ovarian cyst 11/20/00  . Prediabetes   . Sleep apnea    CPAP  . Sliding hiatal hernia    Past Surgical History:  Procedure Laterality Date  . CARPAL TUNNEL RELEASE Bilateral   . CATARACT  EXTRACTION     bilateral  . CHOLECYSTECTOMY    . COLONOSCOPY  2005   Small external hemorrhoids, single sigmoid colon diverticulum, small polyp at the hepatic flexure focally adenomatous.  . COLONOSCOPY  2010   Dr. Gala Romney, Versed 4/Demerol 75. Pancolonic diverticula, external hemorrhoids. Colonoscopy every 5 years for history of adenomatous polyps  . COLONOSCOPY  2014   Dr. Olevia Perches: Propofol. mild diverticulosis in the sigmoid colon, random colon biopsies benign.  . COLONOSCOPY WITH PROPOFOL N/A 09/13/2015   Procedure: COLONOSCOPY WITH PROPOFOL;  Surgeon: Daneil Dolin, MD;  [facility];  Service: Endoscopy;  Laterality: N/A;  1030  . ESOPHAGEAL DILATION N/A 10/26/2014   Procedure: ESOPHAGEAL DILATION [place];  Surgeon: Daneil Dolin, MD;  Location: [facility];  Service: Endoscopy;  Laterality: N/A;  . ESOPHAGOGASTRODUODENOSCOPY  2010   Dr. Gala Romney, distal esophageal erosions, Venia Minks dilator passed. Comment small hiatal hernia, patulous EG junction.  . ESOPHAGOGASTRODUODENOSCOPY (EGD) WITH PROPOFOL N/A 10/26/2014   Dr.Rourk- distal esophageal erosions c/w mild erosive reflux esophagitis. hiatal hernia, o/w normal EGD  . ESOPHAGOGASTRODUODENOSCOPY (EGD) WITH PROPOFOL N/A 09/13/2015   Procedure: ESOPHAGOGASTRODUODENOSCOPY (EGD) WITH PROPOFOL;  Surgeon: Daneil Dolin, MD;  [facility];  Service: Endoscopy;  Laterality: N/A;  . GIVENS CAPSULE STUDY N/A 10/26/2014   couple of mucosal breaks/erosions  but essentially unremarkable small bowel study  . HEMORRHOID BANDING    . KNEE SURGERY     right-arthroscopy  . OOPHORECTOMY  age 33s  . PARTIAL HYSTERECTOMY  age 48s  . RECTAL EXAM UNDER ANESTHESIA N/A 10/26/2014   circumferential grade 3 hemoorrhoids easily reducible  . thumb surger Bilateral    trigger thumb     SOCIAL HISTORY:  Social History   Socioeconomic History  . Marital status: Married    Spouse name: J. Orland Dec, MD  . Number of children: 3  . Years of  education: Not on file  . Highest education level: Not on file  Occupational History  . Occupation: AUDIOLOGIST, retired  Tobacco Use  . Smoking status: Never Smoker  . Smokeless tobacco: Never Used  . Tobacco comment: Pt reports tried once in 7th grade//ms  Substance and Sexual Activity  . Alcohol use: Not Currently    Alcohol/week: 1.0 standard drinks    Types: 1 Glasses of wine per week    Comment: rare  . Drug use: No  . Sexual activity: Never    Birth control/protection: None  Other Topics Concern  . Not on file  Social History Narrative   3 children & married to orthopedist. 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   Social Determinants of Health   Financial Resource Strain:   . Difficulty of Paying Living Expenses: Not on file  Food Insecurity:   . Worried About Charity fundraiser in the Last Year: Not on file  . Ran Out of Food in the Last Year: Not on file  Transportation Needs:   . Lack of Transportation (Medical): Not on file  . Lack of Transportation (Non-Medical): Not on file  Physical Activity:   . Days of Exercise per Week: Not on file  . Minutes of Exercise per Session: Not on file  Stress:   . Feeling of Stress : Not on file  Social Connections:   . Frequency of Communication with Friends and Family: Not on file  . Frequency of Social Gatherings with Friends and Family: Not on file  . Attends Religious Services: Not on file  . Active Member of Clubs or Organizations: Not on file  . Attends Archivist Meetings: Not on file  . Marital Status: Not on file  Intimate Partner Violence:   . Fear of Current or Ex-Partner: Not on file  . Emotionally Abused: Not on file  . Physically Abused: Not on file  . Sexually Abused: Not on file    FAMILY HISTORY:  Family History  Problem Relation Age of Onset  . Ulcers Mother   . Glaucoma Mother   . Heart disease Mother   . Diabetes Mother   . Depression Mother   . Sudden death Mother   .  Obesity Mother   . Glaucoma Maternal Grandmother   . Heart disease Maternal Grandmother   . Heart disease Maternal Grandfather   . Colon cancer Neg Hx     CURRENT MEDICATIONS:  Outpatient Encounter Medications as of 12/24/2018  Medication Sig  . cetirizine (ZYRTEC) 10 MG tablet Take 10 mg by mouth daily.   Marland Kitchen latanoprost (XALATAN) 0.005 % ophthalmic solution   . acetaminophen (TYLENOL) 500 MG tablet Take 500 mg by mouth every 6 (six) hours as needed.  Marland Kitchen HYDROcodone-acetaminophen (NORCO/VICODIN) 5-325 MG tablet Take 1 tablet by mouth every 6 (six) hours as needed for moderate pain.  . naproxen sodium (ALEVE) 220 MG tablet Take  220-440 mg by mouth daily as needed.  . [DISCONTINUED] Semaglutide (RYBELSUS) 3 MG TABS Take 3 mg by mouth every morning.   No facility-administered encounter medications on file as of 12/24/2018.    ALLERGIES:  Allergies  Allergen Reactions  . Injectafer [Ferric Carboxymaltose]     Has tolerated feraheme without problems     PHYSICAL EXAM:  ECOG Performance status: 1  Vitals:   12/24/18 1153  BP: 115/65  Pulse: 62  Resp: 18  Temp: 97.7 F (36.5 C)  SpO2: 100%   Filed Weights   12/24/18 1153  Weight: 185 lb 6.4 oz (84.1 kg)    Physical Exam Constitutional:      Appearance: Normal appearance.  HENT:     Head: Normocephalic.     Right Ear: External ear normal.     Left Ear: External ear normal.  Eyes:     Conjunctiva/sclera: Conjunctivae normal.  Cardiovascular:     Rate and Rhythm: Normal rate and regular rhythm.     Pulses: Normal pulses.     Heart sounds: Normal heart sounds.  Pulmonary:     Effort: Pulmonary effort is normal.     Breath sounds: Normal breath sounds.  Abdominal:     General: Bowel sounds are normal.  Musculoskeletal:     Cervical back: Normal range of motion.  Neurological:     General: No focal deficit present.     Mental Status: She is alert and oriented to person, place, and time.  Psychiatric:        Mood  and Affect: Mood normal.        Behavior: Behavior normal.      LABORATORY DATA:  I have reviewed the labs as listed.  CBC    Component Value Date/Time   WBC 4.3 12/17/2018 1107   RBC 4.64 12/17/2018 1107   HGB 13.0 12/17/2018 1107   HCT 42.0 12/17/2018 1107   PLT 193 12/17/2018 1107   MCV 90.5 12/17/2018 1107   MCH 28.0 12/17/2018 1107   MCHC 31.0 12/17/2018 1107   RDW 13.0 12/17/2018 1107   LYMPHSABS 1.4 12/17/2018 1107   MONOABS 0.3 12/17/2018 1107   EOSABS 0.1 12/17/2018 1107   BASOSABS 0.0 12/17/2018 1107   CMP Latest Ref Rng & Units 12/17/2018 08/12/2018 04/15/2018  Glucose 70 - 99 mg/dL 97 110(H) 109(H)  BUN 8 - 23 mg/dL 19 16 22   Creatinine 0.44 - 1.00 mg/dL 0.64 0.57 0.59  Sodium 135 - 145 mmol/L 140 142 140  Potassium 3.5 - 5.1 mmol/L 4.3 3.9 4.2  Chloride 98 - 111 mmol/L 104 103 103  CO2 22 - 32 mmol/L 28 27 29   Calcium 8.9 - 10.3 mg/dL 9.4 9.3 9.5  Total Protein 6.5 - 8.1 g/dL 7.1 7.1 7.2  Total Bilirubin 0.3 - 1.2 mg/dL 0.9 1.0 0.7  Alkaline Phos 38 - 126 U/L 52 45 44  AST 15 - 41 U/L 17 19 19   ALT 0 - 44 U/L 20 20 21            ASSESSMENT & PLAN:   Iron deficiency anemia 1.  Iron deficiency anemia: - Colonoscopy on 09/13/2015 showing diverticuli in sigmoid colon, descending colon and transverse colon, internal and external hemorrhoids banded -EGD on 09/13/2015 showing normal esophagus, small hiatal hernia - Feraheme infusion in January and February 2019 - Iron deficiency state from a combination of malabsorption and chronic GI loss - Last Feraheme infusion: February 2020 -Hemoglobin and ferritin levels remain stable.  We will  continue to monitor. -She will return to clinic in 1 year or sooner if needed.         [place] [phone]

## 2019-02-14 ENCOUNTER — Other Ambulatory Visit: Payer: Self-pay

## 2019-02-14 ENCOUNTER — Ambulatory Visit: Payer: Medicare Other | Attending: Internal Medicine

## 2019-02-14 DIAGNOSIS — Z20822 Contact with and (suspected) exposure to covid-19: Secondary | ICD-10-CM

## 2019-02-15 LAB — NOVEL CORONAVIRUS, NAA: SARS-CoV-2, NAA: NOT DETECTED

## 2019-02-25 ENCOUNTER — Other Ambulatory Visit: Payer: Self-pay

## 2019-02-25 ENCOUNTER — Ambulatory Visit (INDEPENDENT_AMBULATORY_CARE_PROVIDER_SITE_OTHER): Payer: Medicare Other | Admitting: Radiology

## 2019-02-25 ENCOUNTER — Other Ambulatory Visit: Payer: Self-pay | Admitting: Orthopedic Surgery

## 2019-02-25 ENCOUNTER — Encounter: Payer: Self-pay | Admitting: Radiology

## 2019-02-25 ENCOUNTER — Ambulatory Visit (INDEPENDENT_AMBULATORY_CARE_PROVIDER_SITE_OTHER): Payer: Medicare Other

## 2019-02-25 DIAGNOSIS — M545 Low back pain, unspecified: Secondary | ICD-10-CM

## 2019-02-25 DIAGNOSIS — K649 Unspecified hemorrhoids: Secondary | ICD-10-CM | POA: Insufficient documentation

## 2019-02-25 DIAGNOSIS — D649 Anemia, unspecified: Secondary | ICD-10-CM | POA: Insufficient documentation

## 2019-02-25 DIAGNOSIS — K589 Irritable bowel syndrome without diarrhea: Secondary | ICD-10-CM | POA: Insufficient documentation

## 2019-02-25 DIAGNOSIS — Z90710 Acquired absence of both cervix and uterus: Secondary | ICD-10-CM | POA: Insufficient documentation

## 2019-02-25 NOTE — Progress Notes (Signed)
X-ray only.

## 2019-03-03 ENCOUNTER — Ambulatory Visit (INDEPENDENT_AMBULATORY_CARE_PROVIDER_SITE_OTHER): Payer: Medicare Other | Admitting: Family Medicine

## 2019-03-05 ENCOUNTER — Encounter: Payer: Self-pay | Admitting: Neurology

## 2019-03-06 DIAGNOSIS — Z79899 Other long term (current) drug therapy: Secondary | ICD-10-CM | POA: Diagnosis not present

## 2019-03-06 DIAGNOSIS — E118 Type 2 diabetes mellitus with unspecified complications: Secondary | ICD-10-CM | POA: Diagnosis not present

## 2019-03-06 DIAGNOSIS — G609 Hereditary and idiopathic neuropathy, unspecified: Secondary | ICD-10-CM | POA: Diagnosis not present

## 2019-03-06 DIAGNOSIS — E785 Hyperlipidemia, unspecified: Secondary | ICD-10-CM | POA: Diagnosis not present

## 2019-03-07 ENCOUNTER — Encounter: Payer: Self-pay | Admitting: Neurology

## 2019-03-07 ENCOUNTER — Other Ambulatory Visit: Payer: Self-pay

## 2019-03-07 ENCOUNTER — Ambulatory Visit (INDEPENDENT_AMBULATORY_CARE_PROVIDER_SITE_OTHER): Payer: Medicare Other | Admitting: Neurology

## 2019-03-07 VITALS — BP 140/75 | HR 67 | Temp 97.2°F | Ht 65.0 in | Wt 192.0 lb

## 2019-03-07 DIAGNOSIS — H5509 Other forms of nystagmus: Secondary | ICD-10-CM

## 2019-03-07 DIAGNOSIS — H5500 Unspecified nystagmus: Secondary | ICD-10-CM | POA: Diagnosis not present

## 2019-03-07 DIAGNOSIS — R519 Headache, unspecified: Secondary | ICD-10-CM | POA: Diagnosis not present

## 2019-03-07 DIAGNOSIS — S069X9A Unspecified intracranial injury with loss of consciousness of unspecified duration, initial encounter: Secondary | ICD-10-CM

## 2019-03-07 DIAGNOSIS — R42 Dizziness and giddiness: Secondary | ICD-10-CM | POA: Diagnosis not present

## 2019-03-07 DIAGNOSIS — Z6835 Body mass index (BMI) 35.0-35.9, adult: Secondary | ICD-10-CM | POA: Diagnosis not present

## 2019-03-07 DIAGNOSIS — S060X0A Concussion without loss of consciousness, initial encounter: Secondary | ICD-10-CM | POA: Diagnosis not present

## 2019-03-07 DIAGNOSIS — E118 Type 2 diabetes mellitus with unspecified complications: Secondary | ICD-10-CM | POA: Insufficient documentation

## 2019-03-07 DIAGNOSIS — R412 Retrograde amnesia: Secondary | ICD-10-CM | POA: Diagnosis not present

## 2019-03-07 DIAGNOSIS — S069XAA Unspecified intracranial injury with loss of consciousness status unknown, initial encounter: Secondary | ICD-10-CM

## 2019-03-07 NOTE — Progress Notes (Signed)
Provider:  Larey Seat, M D  Referring Provider: Asencion Noble, MD Primary Care Physician:  Asencion Noble, MD  Chief Complaint  Patient presents with  . New Patient (Initial Visit)    pt alone, rm 11. pt presents today post head injury. on nov 16, was using a shovel and snapped back and hit her in the head. she developed a goose egg. she began to have vision difficulties but iced all afternoon. her husband is a MD, ordered a MRI without contrast ? concussion. symptoms worsened. pt noticed dizziness upon laying down and turn her head to right. ? H/o of glaucoma    HPI:  Debra Harris is a 69 y.o. female and is seen upon a referral from Dr. Willey Blade for a headache evaluation.   Mr. Boeckmann as a audiologist, now retired.  Her daughter works in a Astron[employer] her husband is a part time orthopedist at this time. On November 16th she injured herself with a shuffle trying to remove a plant which snapped back was digging. She immediately developed a big goose egg over her left eye and eyebrow and jokingly stated that she looked like a clean from [employer].  2 days later a hematoma had descended into the left cheekbone and perioral area.  It was then that her daughter made her see another physician who then ordered a CT of the brain which reportedly came back negative.  However her symptoms did not get better as the external injuries resolved.  She states that she now still feels some dizziness- a spinning sensation of vertigo and she indicates a clockwise rotation. She is a CPAP user- and she noted days before the injury that she can get lightheaded when she turns to sleep on the right.  No memory issues, but more confusion and wordfinding. Sleep has been horrible for while, she did well until mid- pandemic, but has become more unsettled , mind is racing at night. More over the last 3 month.  The vertigo didn't help. 12/31/ 2020 she fell down outdoors, on a staircase. She had looked to the  side, caught a vertigo suddenly lost her footing, her balance and she fell backwards. New onset headaches. No bony injuries.    Social history - not a smoker, not a drinker= are cocktail, one at a time. She adheres to a high guaifenesin diet.    Review of Systems: Out of a complete 14 system review, the patient complains of only the following symptoms, and all other reviewed systems are negative.  see above  fibromyalgia in the past - now presentng with vertigo, insomnia, and memory changes.  Postconcussion.  Social History   Socioeconomic History  . Marital status: Married    Spouse name: J. Orland Dec, MD  . Number of children: 3  . Years of education: Not on file  . Highest education level: Not on file  Occupational History  . Occupation: AUDIOLOGIST, retired  Tobacco Use  . Smoking status: Never Smoker  . Smokeless tobacco: Never Used  . Tobacco comment: Pt reports tried once in 7th grade//ms  Substance and Sexual Activity  . Alcohol use: Not Currently    Alcohol/week: 1.0 standard drinks    Types: 1 Glasses of wine per week    Comment: rare  . Drug use: No  . Sexual activity: Never    Birth control/protection: None  Other Topics Concern  . Not on file  Social History Narrative   3 children &  married to orthopedist. 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   Social Determinants of Health   Financial Resource Strain:   . Difficulty of Paying Living Expenses: Not on file  Food Insecurity:   . Worried About Charity fundraiser in the Last Year: Not on file  . Ran Out of Food in the Last Year: Not on file  Transportation Needs:   . Lack of Transportation (Medical): Not on file  . Lack of Transportation (Non-Medical): Not on file  Physical Activity:   . Days of Exercise per Week: Not on file  . Minutes of Exercise per Session: Not on file  Stress:   . Feeling of Stress : Not on file  Social Connections:   . Frequency of Communication with Friends and  Family: Not on file  . Frequency of Social Gatherings with Friends and Family: Not on file  . Attends Religious Services: Not on file  . Active Member of Clubs or Organizations: Not on file  . Attends Archivist Meetings: Not on file  . Marital Status: Not on file  Intimate Partner Violence:   . Fear of Current or Ex-Partner: Not on file  . Emotionally Abused: Not on file  . Physically Abused: Not on file  . Sexually Abused: Not on file    Family History  Problem Relation Age of Onset  . Ulcers Mother   . Glaucoma Mother   . Heart disease Mother   . Diabetes Mother   . Depression Mother   . Sudden death Mother   . Obesity Mother   . Glaucoma Maternal Grandmother   . Heart disease Maternal Grandmother   . Heart disease Maternal Grandfather   . Colon cancer Neg Hx     Past Medical History:  Diagnosis Date  . Adenomatous colon polyp   . Allergic rhinitis   . Arthritis   . Back pain   . Constipation   . Diabetes mellitus   . Diverticulosis   . External hemorrhoids   . Fibromyalgia   . Gallbladder problem   . GERD (gastroesophageal reflux disease)   . Glaucoma   . Glaucoma   . Hemorrhoids    grade 3   . Hip pain   . IBS (irritable bowel syndrome)   . IDA (iron deficiency anemia)   . Iron deficiency anemia 06/05/2014  . Irregular heartbeat   . Leg edema   . Leg numbness   . Leg pain   . Low tension glaucoma   . Neuropathy, leg   . Ovarian cyst 11/20/00  . Prediabetes   . Sleep apnea    CPAP  . Sliding hiatal hernia     Past Surgical History:  Procedure Laterality Date  . CARPAL TUNNEL RELEASE Bilateral   . CATARACT EXTRACTION     bilateral  . CHOLECYSTECTOMY    . COLONOSCOPY  2005   Small external hemorrhoids, single sigmoid colon diverticulum, small polyp at the hepatic flexure focally adenomatous.  . COLONOSCOPY  2010   Dr. Gala Romney, Versed 4/Demerol 75. Pancolonic diverticula, external hemorrhoids. Colonoscopy every 5 years for history of  adenomatous polyps  . COLONOSCOPY  2014   Dr. Olevia Perches: Propofol. mild diverticulosis in the sigmoid colon, random colon biopsies benign.  . COLONOSCOPY WITH PROPOFOL N/A 09/13/2015   Procedure: COLONOSCOPY WITH PROPOFOL;  Surgeon: Daneil Dolin, MD;  Location: [facility];  Service: Endoscopy;  Laterality: N/A;  1030  . ESOPHAGEAL DILATION N/A 10/26/2014   Procedure: ESOPHAGEAL  [place];  Surgeon: Daneil Dolin, MD;  Location: [facility];  Service: Endoscopy;  Laterality: N/A;  . ESOPHAGOGASTRODUODENOSCOPY  2010   Dr. Gala Romney, distal esophageal erosions, Venia Minks dilator passed. Comment small hiatal hernia, patulous EG junction.  . ESOPHAGOGASTRODUODENOSCOPY (EGD) WITH PROPOFOL N/A 10/26/2014   Dr.Rourk- distal esophageal erosions c/w mild erosive reflux esophagitis. hiatal hernia, o/w normal EGD  . ESOPHAGOGASTRODUODENOSCOPY (EGD) WITH PROPOFOL N/A 09/13/2015   Procedure: ESOPHAGOGASTRODUODENOSCOPY (EGD) WITH PROPOFOL;  Surgeon: Daneil Dolin, MD;  Location: [facility];  Service: Endoscopy;  Laterality: N/A;  . GIVENS CAPSULE STUDY N/A 10/26/2014   couple of mucosal breaks/erosions but essentially unremarkable small bowel study  . HEMORRHOID BANDING    . KNEE SURGERY     right-arthroscopy  . OOPHORECTOMY  age 63s  . PARTIAL HYSTERECTOMY  age 28s  . RECTAL EXAM UNDER ANESTHESIA N/A 10/26/2014   circumferential grade 3 hemoorrhoids easily reducible  . thumb surger Bilateral    trigger thumb    Current Outpatient Medications  Medication Sig Dispense Refill  . acetaminophen (TYLENOL) 500 MG tablet Take 500 mg by mouth every 6 (six) hours as needed.    . cetirizine (ZYRTEC) 10 MG tablet Take 10 mg by mouth daily.     Marland Kitchen latanoprost (XALATAN) 0.005 % ophthalmic solution     . Magnesium 400 MG TABS Take 1 tablet by mouth daily.    . Multiple Vitamins-Minerals (MULTIVITAMIN ADULT PO) Take 1 tablet by mouth daily.    . NON FORMULARY CPAP at night    . Wheat Dextrin (BENEFIBER) POWD Take  by mouth daily.     No current facility-administered medications for this visit.    Allergies as of 03/07/2019 - Review Complete 03/07/2019  Allergen Reaction Noted  . Injectafer [ferric carboxymaltose]  06/14/2016    Vitals: BP 140/75   Pulse 67   Temp (!) 97.2 F (36.2 C)   Ht 5\' 5"  (1.651 m)   Wt 192 lb (87.1 kg)   BMI 31.95 kg/m  Last Weight:  Wt Readings from Last 1 Encounters:  03/07/19 192 lb (87.1 kg)   Last Height:   Ht Readings from Last 1 Encounters:  03/07/19 5\' 5"  (1.651 m)    Physical exam:  General: The patient is awake, alert and appears not in acute distress. The patient is well groomed. Head: Normocephalic, atraumatic. Neck is supple. Mallampati 2-3/ Cardiovascular:  Regular rate and rhythm, without  murmurs or carotid bruit, and without distended neck veins. Respiratory: Lungs are clear to auscultation. Skin:  Without evidence of edema, or rash Trunk: BMI is 31 elevated and patient  has normal posture.  Neurologic exam : The patient is awake and alert, oriented to place and time.   Memory subjective  described as impaired. There is a normal attention span & concentration ability.  Speech is fluent without  dysarthria, dysphonia or aphasia.  Mood and affect are appropriate.  Cranial nerves: Pupils are equal and briskly reactive to light. Funduscopic exam without evidence of pallor or edema. Extraocular movements  in vertical and horizontal planes  with nystagmus, with lateral gaze. .  Visual fields by finger perimetry are intact. Hearing to finger rub intact.  Facial sensation intact to fine touch. Facial motor strength is symmetric and tongue and uvula move midline.  Tongue protrusion into either cheek is normal. Shoulder shrug is normal.   Motor exam:  Normal tone ,muscle bulk and symmetric  strength in all extremities. Sensory:  Fine touch, pinprick  and vibration were tested in all extremities. Proprioception was normal. Coordination: Rapid  alternating movements in the fingers/hands were normal. Finger-to-nose maneuver  normal without evidence of ataxia, dysmetria or tremor. Gait and station: Patient walks without assistive device and is able unassisted to climb up to the exam table. Strength within normal limits.  Stance is stable and normal. Tandem gait is unfragmented. Romberg testing is positive.   Deep tendon reflexes: in the  upper and lower extremities are symmetric and intact. Babinski maneuver response is  downgoing.   Assessment:  After physical and neurologic examination, review of laboratory studies, imaging, neurophysiology testing and pre-existing records, assessment is that of :   Concussion  Would explain Vertigo, postraumatic, positional dependent.   Plan:  Treatment plan and additional workup :  MRI brain-  I like to use contrast - will look for concussion verus contusion.  Vestibular Rehab.  RV with MOCA  Blood loss of unknown origin- followed by hematologist.    Larey Seat MD 03/07/2019

## 2019-03-07 NOTE — Patient Instructions (Signed)
Concussion, Adult  A concussion is a brain injury from a hard, direct hit (trauma) to your head or body. This direct hit causes the brain to quickly shake back and forth inside the skull. A concussion may also be called a mild traumatic brain injury (TBI). Healing from this injury can take time. What are the causes? This condition is caused by:  A direct hit to your head, such as: ? Running into a player during a game. ? Being hit in a fight. ? Hitting your head on a hard surface.  A quick and sudden movement (jolt) of the head or neck, such as in a car crash. What are the signs or symptoms? The signs of a concussion can be hard to notice. They may be missed by you, family members, and doctors. You may look fine on the outside but may not act or feel normal. Physical symptoms  Headaches.  Being tired (fatigued).  Being dizzy.  Problems with body balance.  Problems seeing or hearing.  Being sensitive to light or noise.  Feeling sick to your stomach (nausea) or throwing up (vomiting).  Not sleeping or eating as you used to.  Loss of feeling (numbness) or tingling in the body.  Seizure. Mental and emotional symptoms  Problems remembering things.  Trouble focusing your mind (concentrating), organizing, or making decisions.  Being slow to think, act, react, speak, or read.  Feeling grouchy (irritable).  Having mood changes.  Feeling worried or nervous (anxious).  Feeling sad (depressed). How is this treated? This condition may be treated by:  Stopping sports or activity if you are injured. If you hit your head or have signs of concussion: ? Do not return to sports or activities the same day. ? Get checked by a doctor before you return to your activities.  Resting your body and your mind.  Being watched carefully, often at home.  Medicines to help with symptoms such as: ? Feeling sick to your stomach. ? Headaches. ? Problems with sleep.  Avoid taking strong  pain medicines (opioids) for a concussion.  Avoiding alcohol and drugs.  Being asked to go to a concussion clinic or a place to help you recover (rehabilitation center). Recovery from a concussion can take time. Return to activities only:  When you are fully healed.  When your doctor says it is safe. Follow these instructions at home: Activity  Limit activities that need a lot of thought or focus, such as: ? Homework or work for your job. ? Watching TV. ? Using the computer or phone. ? Playing memory games and puzzles.  Rest. Rest helps your brain heal. Make sure you: ? Get plenty of sleep. Most adults should get 7-9 hours of sleep each night. ? Rest during the day. Take naps or breaks when you feel tired.  Avoid activity like exercise until your doctor says its safe. Stop any activity that makes symptoms worse.  Do not do activities that could cause a second concussion, such as riding a bike or playing sports.  Ask your doctor when you can return to your normal activities, such as school, work, sports, and driving. Your ability to react may be slower. Do not do these activities if you are dizzy. General instructions   Take over-the-counter and prescription medicines only as told by your doctor.  Do not drink alcohol until your doctor says you can.  Watch your symptoms and tell other people to do the same. Other problems can occur after a concussion. Older   adults have a higher risk of serious problems.  Tell your work manager, teachers, school nurse, school counselor, coach, or athletic trainer about your injury and symptoms. Tell them about what you can or cannot do.  Keep all follow-up visits as told by your doctor. This is important. How is this prevented?  It is very important that you do not get another brain injury. In rare cases, another injury can cause brain damage that will not go away, brain swelling, or death. The risk of this is greatest in the first 7-10 days  after a head injury. To avoid injuries: ? Stop activities that could lead to a second concussion, such as contact sports, until your doctor says it is okay. ? When you return to sports or activities:  Do not crash into other players. This is how most concussions happen.  Follow the rules.  Respect other players. ? Get regular exercise. Do strength and balance training. ? Wear a helmet that fits you well during sports, biking, or other activities.  Helmets can help protect you from serious skull and brain injuries, but they do not protect you from a concussion. Even when wearing a helmet, you should avoid being hit in the head. Contact a doctor if:  Your symptoms get worse or they do not get better.  You have new symptoms.  You have another injury. Get help right away if:  You have bad headaches or your headaches get worse.  You feel weak or numb in any part of your body.  You are mixed up (confused).  Your balance gets worse.  You keep throwing up.  You feel more sleepy than normal.  Your speech is not clear (is slurred).  You cannot recognize people or places.  You have a seizure.  Others have trouble waking you up.  You have behavior changes.  You have changes in how you see (vision).  You pass out (lose consciousness). Summary  A concussion is a brain injury from a hard, direct hit (trauma) to your head or body.  This condition is treated with rest and careful watching of symptoms.  If you keep having symptoms, call your doctor. This information is not intended to replace advice given to you by your health care provider. Make sure you discuss any questions you have with your health care provider. Document Revised: 10/04/2017 Document Reviewed: 10/04/2017 Elsevier Patient Education  2020 [employer]  

## 2019-03-14 ENCOUNTER — Telehealth: Payer: Self-pay

## 2019-03-14 ENCOUNTER — Other Ambulatory Visit (HOSPITAL_COMMUNITY): Payer: Self-pay | Admitting: Neurology

## 2019-03-14 MED ORDER — DIVALPROEX SODIUM ER 500 MG PO TB24: 500.0000 mg | ORAL_TABLET | Freq: Every day | ORAL | 1 refills | Status: DC

## 2019-03-14 NOTE — Progress Notes (Signed)
I returned call from patient to our office stating that her headache got worse yesterday and she was severely dizzy in fact she passed out early in the morning.  She took some over-the-counter analgesics that she had.  She still has a persistent moderate dull headache today but is not as dizzy.  Recommend she try Topamax for her postconcussion headaches but she declined giving family history of glaucoma.  I recommend Depakote ER 500 mg daily instead.  Patient was also concerned that her MRI of the brain was not scheduled to February 5 and wanted it to be done sooner.  I recommend she call the office on Monday to see if it can be please do not.  If she has recurrent severe headache or dizziness advised her to go to the ER and we could get an MRI on emergent basis.  She voiced understanding

## 2019-03-14 NOTE — Telephone Encounter (Signed)
Patient called to report her concussion has not been getting better when she recently saw Dr,Dohemeier and states her MRI isn't until 3 more weeks and states she had severe pain last night and was advised by Dr.dohemeier to contact the office if her pain got any worst.   Patient states she took a pain pill and  naproxen and neither helped and the feeling got worst.patient states it started at 9:30pm and is now a dull pain but the pain is still there. Patients husband informed patient she passed out at about 1:15 am. Patient states she also wrapped her head in ice and that also did not help.Patient states she was close to going to the ED but with COVID would like to avoid if possible.  Please follow up

## 2019-03-14 NOTE — Telephone Encounter (Signed)
I spoke to the patient and recommend you try Depakote ER 500 mg daily.  She was advised to call the office on Monday to try to prepare on her MRI appointment.  If headache got worse she was advised to go to the emergency room to get an expedited MRI

## 2019-03-17 DIAGNOSIS — Z23 Encounter for immunization: Secondary | ICD-10-CM | POA: Diagnosis not present

## 2019-03-25 ENCOUNTER — Ambulatory Visit: Payer: Medicare Other

## 2019-03-28 ENCOUNTER — Ambulatory Visit: Payer: Medicare Other | Attending: Neurology

## 2019-03-28 ENCOUNTER — Other Ambulatory Visit: Payer: Self-pay

## 2019-03-28 DIAGNOSIS — H8111 Benign paroxysmal vertigo, right ear: Secondary | ICD-10-CM | POA: Diagnosis not present

## 2019-03-28 DIAGNOSIS — R2689 Other abnormalities of gait and mobility: Secondary | ICD-10-CM | POA: Insufficient documentation

## 2019-03-28 DIAGNOSIS — R42 Dizziness and giddiness: Secondary | ICD-10-CM | POA: Diagnosis not present

## 2019-03-28 NOTE — Therapy (Signed)
[facility] [address] [place], [name], [address] Phone: [phone]   Fax:  [phone]  Physical Therapy Evaluation  Patient Details  Name: Debra Harris MRN: [id number] Date of Birth: 1950-03-23 Referring Provider (PT): Dr. Brett Fairy   Encounter Date: 03/28/2019  PT End of Session - 03/28/19 1413    Visit Number  1    Number of Visits  5    Date for PT Re-Evaluation  05/27/19    Authorization Type  Medicare and [employer] secondary    PT Start Time  1315    PT Stop Time  1405    PT Time Calculation (min)  50 min    Equipment Utilized During Treatment  Other (comment)   S prn   Activity Tolerance  Patient tolerated treatment well    Behavior During Therapy  [facility] for tasks assessed/performed       Past Medical History:  Diagnosis Date  . Adenomatous colon polyp   . Allergic rhinitis   . Arthritis   . Back pain   . Constipation   . Diabetes mellitus   . Diverticulosis   . External hemorrhoids   . Fibromyalgia   . Gallbladder problem   . GERD (gastroesophageal reflux disease)   . Glaucoma   . Glaucoma   . Hemorrhoids    grade 3   . Hip pain   . IBS (irritable bowel syndrome)   . IDA (iron deficiency anemia)   . Iron deficiency anemia 06/05/2014  . Irregular heartbeat   . Leg edema   . Leg numbness   . Leg pain   . Low tension glaucoma   . Neuropathy, leg   . Ovarian cyst 11/20/00  . Prediabetes   . Sleep apnea    CPAP  . Sliding hiatal hernia     Past Surgical History:  Procedure Laterality Date  . CARPAL TUNNEL RELEASE Bilateral   . CATARACT EXTRACTION     bilateral  . CHOLECYSTECTOMY    . COLONOSCOPY  2005   Small external hemorrhoids, single sigmoid colon diverticulum, small polyp at the hepatic flexure focally adenomatous.  . COLONOSCOPY  2010   Dr. Gala Romney, Versed 4/Demerol 75. Pancolonic diverticula, external hemorrhoids. Colonoscopy every 5 years for history of adenomatous  polyps  . COLONOSCOPY  2014   Dr. Olevia Perches: Propofol. mild diverticulosis in the sigmoid colon, random colon biopsies benign.  . COLONOSCOPY WITH PROPOFOL N/A 09/13/2015   Procedure: COLONOSCOPY WITH PROPOFOL;  Surgeon: Daneil Dolin, MD;  Location: [facility];  Service: Endoscopy;  Laterality: N/A;  1030  . ESOPHAGEAL DILATION N/A 10/26/2014   Procedure: ESOPHAGEAL DILATION [place];  Surgeon: Daneil Dolin, MD;  Location: [facility] OR[facility];  Service: Endoscopy;  Laterality: N/A;  . ESOPHAGOGASTRODUODENOSCOPY  2010   Dr. Gala Romney, distal esophageal erosions, Venia Minks dilator passed. Comment small hiatal hernia, patulous EG junction.  . ESOPHAGOGASTRODUODENOSCOPY (EGD) WITH PROPOFOL N/A 10/26/2014   Dr.Rourk- distal esophageal erosions c/w mild erosive reflux esophagitis. hiatal hernia, o/w normal EGD  . ESOPHAGOGASTRODUODENOSCOPY (EGD) WITH PROPOFOL N/A 09/13/2015   Procedure: ESOPHAGOGASTRODUODENOSCOPY (EGD) WITH PROPOFOL;  Surgeon: Daneil Dolin, MD;  Location: [facility];  Service: Endoscopy;  Laterality: N/A;  . GIVENS CAPSULE STUDY N/A 10/26/2014   couple of mucosal breaks/erosions but essentially unremarkable small bowel study  . HEMORRHOID BANDING    . KNEE SURGERY     right-arthroscopy  . OOPHORECTOMY  age 14s  . PARTIAL HYSTERECTOMY  age 28s  .  RECTAL EXAM UNDER ANESTHESIA N/A 10/26/2014   circumferential grade 3 hemoorrhoids easily reducible  . thumb surger Bilateral    trigger thumb    There were no vitals filed for this visit.   Subjective Assessment - 03/28/19 1323    Subjective  Pt had shovel hit face in 12/2018 while gardening in the yard (large goose egg proximal to L eye). Pt had dizziness prior to incident but dizziness has been getting worse since injury. Pt had fall on stairs after concussion due to dizziness, CT was negative but is waiting for MRI. Pt had a low grade HA, but it stopped approx. two weeks ago. Pt feels unsteady on feet with intermittent dizziness. Dizziness is  worse when rolling to the R and sudden movements, describes dizziness as spinning sensation. At worst dizziness: 6-7/10, 4-5/10 average, 0/10 at best. Pt denied new onset of weakness or tinnitus. Pt was told by neurologist (from dtr's office in [place]).    Pertinent History  DM (but pt reported she doesn't need medication), hx of dizziness, LBP, arthritis, BLE neuropathy, fibromyalgia, sleep apnea with CPAP, iron-deficiency anemia    Patient Stated Goals  I want to know if it's getting better, make sure everything is ok, function like before    Currently in Pain?  No/denies         [facility] PT Assessment - 03/28/19 1334      Assessment   Medical Diagnosis  Concussion    Referring Provider (PT)  Dr. Brett Fairy    Onset Date/Surgical Date  01/13/19    Hand Dominance  Right    Prior Therapy  none      Precautions   Precautions  Fall      Restrictions   Weight Bearing Restrictions  No      Balance Screen   Has the patient fallen in the past 6 months  Yes    How many times?  1    Has the patient had a decrease in activity level because of a fear of falling?   No    Is the patient reluctant to leave their home because of a fear of falling?   No      Home Environment   Living Environment  Private residence    Living Arrangements  Spouse/significant other    Available Help at Discharge  Family;Available PRN/intermittently   husband works 3x/week   Type of [place] to enter    [employer] of Steps  3    Entrance Stairs-Rails  Right    Home Layout  One level;Able to live on main level with bedroom/bathroom    Home Equipment  None      Prior Function   Level of Independence  Independent    Vocation  Retired    [employer]  Retired Investment banker, operational, game nights      Cognition   Overall Cognitive Status  No family/caregiver present to determine baseline cognitive functioning   pt reported she's never had a great memory      Sensation   Light Touch  Impaired by gross assessment    Additional Comments  Pt reported decr. light touch and intermittent N/T in feet with hx of neuropathy.       Ambulation/Gait   Ambulation/Gait  Yes    Ambulation/Gait Assistance  7: Independent    Ambulation Distance (Feet)  100 Feet    Assistive device  None  Gait Pattern  Step-through pattern;Within Functional Limits    Ambulation Surface  Level;Indoor    Gait velocity  3.56ft/sec. no AD    Gait Comments  Will assess gait over uneven surfaces as indicated.            Vestibular Assessment - 03/28/19 1343      Vestibular Assessment   General Observation  Has hx of dizziness. Hx of B cataract extraction, with implanted lens-R eye is for distance and L eye for up close (glasses did not work well)      Symptom Behavior   Subjective history of current problem  Incr. dizziness after shovel hit pt in head in 11/20    Type of Dizziness   Spinning    Frequency of Dizziness  2-3 times since accident she felt spinning sensation, otherwise unsteady.    Duration of Dizziness  Less than 1 minute    Symptom Nature  Positional    Aggravating Factors  Turning head quickly;Turning body quickly;Rolling to right;Lying supine    Relieving Factors  Rest;Slow movements    Progression of Symptoms  Better   HA is better, dizziness slightly improved     Oculomotor Exam   Oculomotor Alignment  Normal    Spontaneous  Absent    Gaze-induced   Left beating nystagmus with L gaze    Smooth Pursuits  Saccades   saccades in L quadrant    Saccades  Comment;Dysmetria   overshoots when coming back to target on L side   Comment  Convergence: diplopia reported at 1.5 inches from nose.      Oculomotor Exam-Fixation Suppressed    Left Head Impulse  1 beat of corrective saccade but no dizziness reported.     Right Head Impulse  1 beat of corrective saccade but no dizziness reported.      Vestibulo-Ocular Reflex   VOR 1 Head Only (x 1  viewing)  Pt reported wooziness during VOR.     VOR Cancellation  Normal      Positional Testing   Dix-Hallpike  Dix-Hallpike Right;Dix-Hallpike Left    Horizontal Canal Testing  Horizontal Canal Right;Horizontal Canal Left      Dix-Hallpike Right   Dix-Hallpike Right Duration  <20 seconds with 3/10 dizziness    Dix-Hallpike Right Symptoms  Upbeat, right rotatory nystagmus      Dix-Hallpike Left   Dix-Hallpike Left Duration  0    Dix-Hallpike Left Symptoms  No nystagmus      Horizontal Canal Right   Horizontal Canal Right Duration  0    Horizontal Canal Right Symptoms  Normal      Horizontal Canal Left   Horizontal Canal Left Duration  0    Horizontal Canal Left Symptoms  Normal          Objective measurements completed on examination: See above findings.       Vestibular Treatment/Exercise - 03/28/19 1410      Vestibular Treatment/Exercise   Vestibular Treatment Provided  Canalith Repositioning    Canalith Repositioning  Epley Manuever Right       EPLEY MANUEVER RIGHT   Number of Reps   2    Overall Response  Improved Symptoms    Response Details   Pt reported dizziness decr. to 1/10 after treatment.            PT Education - 03/28/19 1412    Education Details  PT discussed exam findings, BPPV, PT POC, frequency and duration.    Person(s) Educated  Patient    Methods  Explanation    Comprehension  Verbalized understanding       PT Short Term Goals - 03/28/19 1419      PT SHORT TERM GOAL #1   Title  same as LTGs        PT Long Term Goals - 03/28/19 1419      PT LONG TERM GOAL #1   Title  Pt will report 0/10 dizziness during all mobility to improve safety during all mobility. TARGET DATE FOR ALL LTGS:04/25/19    Baseline  4-5/10 average    Time  4    Period  Weeks    Status  New      PT LONG TERM GOAL #2   Title  Perform SOT and FGA and write goals as indicated.    Time  4    Period  Weeks    Status  New      PT LONG TERM GOAL #3    Title  Pt will amb. 1000' over even/uneven terrain, IND, performing head turns and 180 turns with dizziness <1/10 to improve functional mobility.    Baseline  100' over even terrain, guarded, slow turns    Time  4    Period  Weeks    Status  New      PT LONG TERM GOAL #4   Title  Pt will report no dizziness during all positional testing to improve safety and QOL.    Baseline  3/10 during R Dix-Hallpike today    Time  4    Period  Weeks    Status  New      PT LONG TERM GOAL #5   Baseline  Pt will be IND with HEP to improve dizziness and balance.    Time  4    Period  Weeks    Status  New             Plan - 03/28/19 1413    Clinical Impression Statement  Pt is a pleasant 68y/o female presenting to OPPT neuro for dizziness s/p concussion. Pt's PMH is significant for the following: DM (but pt reported she doesn't need medication), hx of dizziness, LBP, arthritis, BLE neuropathy, fibromyalgia, sleep apnea with CPAP, iron-deficiency anemia. Pt's gait speed was WNL. PT will formally assess balance next session as pt has reported unsteadiness and experience one fall s/p injury. Pt's dizziness likely multi-factorial in nature, as exam consistent with L hypofunction and R pBPPV. Pt reported improved dizziness after R Epley treatment. The following impairments noted upon exam: dizziness, impaired sensation, and balance and gait over uneven surfaces likely impacted based on subjective report and will be assessed next session. Pt would benefit from skilled PT to improve safety during functional mobility.    Personal Factors and Comorbidities  Age;Comorbidity 3+    Comorbidities  DM (but pt reported she doesn't need medication), hx of dizziness, LBP, arthritis, BLE neuropathy, fibromyalgia, sleep apnea with CPAP, iron-deficiency anemia    Examination-Activity Limitations  Bed Mobility;Locomotion Level;Dressing;Stairs;Stand;Transfers    Examination-Participation Restrictions  Yard  Work;Driving;Cleaning    Stability/Clinical Decision Making  Evolving/Moderate complexity    Clinical Decision Making  Moderate    Rehab Potential  Good    PT Frequency  1x / week    PT Duration  4 weeks   1 eval + 4 treatments   PT Treatment/Interventions  ADLs/Self Care Home Management;Canalith Repostioning;Therapeutic exercise;Balance training;Therapeutic activities;Manual techniques;Vestibular;Functional mobility training;Stair training;Orthotic Fit/Training;Gait training;DME Instruction;Patient/family education;Cognitive remediation;Neuromuscular re-education  PT Next Visit Plan  Reassess for R pBPPV and treat prn, perform FGA and SOT. Write goals as needed. HEP for x1 viewing.    Consulted and Agree with Plan of Care  Patient       Patient will benefit from skilled therapeutic intervention in order to improve the following deficits and impairments:  Abnormal gait, Dizziness, Decreased balance  Visit Diagnosis: BPPV (benign paroxysmal positional vertigo), right - Plan: PT plan of care cert/re-cert  Dizziness and giddiness - Plan: PT plan of care cert/re-cert  Other abnormalities of gait and mobility - Plan: PT plan of care cert/re-cert     Problem List Patient Active Problem List   Diagnosis Date Noted  . Concussion with no loss of consciousness 03/07/2019  . Retrograde memory loss 03/07/2019  . Vertigo due to brain injury ([place]) 03/07/2019  . Nystagmus on abduction 03/07/2019  . New onset of headaches 03/07/2019  . Controlled type 2 diabetes mellitus with complication, without long-term current use of insulin ([place]) 03/07/2019  . Class 2 severe obesity with serious comorbidity and body mass index (BMI) of 35.0 to 35.9 in adult ([place]) 03/07/2019  . Anemia 02/25/2019  . Hemorrhoids 02/25/2019  . History of hysterectomy 02/25/2019  . Irritable bowel syndrome 02/25/2019  . Closed fracture of nasal bones 07/29/2018  . Obstruction of nasal valve 07/29/2018  . Obesity (BMI  30-39.9) 08/22/2017  . Other fatigue 07/25/2017  . Shortness of breath on exertion 07/25/2017  . Prediabetes 07/25/2017  . Vitamin D deficiency 07/25/2017  . B12 nutritional deficiency 07/25/2017  . Heart murmur 05/10/2016  . Diverticulosis of colon without hemorrhage   . IDA (iron deficiency anemia)   . Rectal bleeding   . History of melena   . History of colonic polyps   . Third degree hemorrhoids   . Reflux esophagitis   . Hiatal hernia   . Dysphagia, pharyngoesophageal phase   . Adenomatous colon polyp 10/16/2014  . Melena 10/12/2014  . Iron deficiency anemia 06/05/2014  . Seasonal allergies 03/31/2014  . Type 2 diabetes mellitus ([place]) 03/31/2014  . Bilateral dry eyes 01/20/2014  . Blepharochalasis 01/20/2014  . H/O sleep apnea 01/20/2014  . Pseudophakia of both eyes 01/20/2014  . OSA (obstructive sleep apnea) 01/02/2011  . HEMATOCHEZIA 12/28/2008  . ABDOMINAL PAIN, LEFT LOWER QUADRANT 12/28/2008  . COLONIC POLYPS, ADENOMATOUS, HX OF 12/28/2008  . GERD 12/25/2008  . ARTHRITIS 12/25/2008  . LOW BACK PAIN, ACUTE 12/25/2008  . Dysphagia 12/25/2008  . PUD, HX OF 12/25/2008    Orvill Coulthard L 03/28/2019, 2:22 PM  [facility] [address] [place], [name], [address] Phone: [phone]   Fax:  [phone]  Name: Debra Harris MRN: [id number] Date of Birth: [birth year]   Geoffry Paradise, PT,DPT 03/28/19 2:23 PM Phone: [phone] Fax: [phone]

## 2019-04-03 ENCOUNTER — Ambulatory Visit: Payer: Medicare Other

## 2019-04-04 ENCOUNTER — Ambulatory Visit
Admission: RE | Admit: 2019-04-04 | Discharge: 2019-04-04 | Disposition: A | Discharge status: Home / Self Care | Payer: Medicare Other | Source: Ambulatory Visit | Attending: Neurology | Admitting: Neurology

## 2019-04-04 DIAGNOSIS — S060X0A Concussion without loss of consciousness, initial encounter: Secondary | ICD-10-CM

## 2019-04-04 DIAGNOSIS — R412 Retrograde amnesia: Secondary | ICD-10-CM | POA: Diagnosis not present

## 2019-04-04 DIAGNOSIS — S069X9A Unspecified intracranial injury with loss of consciousness of unspecified duration, initial encounter: Secondary | ICD-10-CM

## 2019-04-04 DIAGNOSIS — R519 Headache, unspecified: Secondary | ICD-10-CM

## 2019-04-04 DIAGNOSIS — S069XAA Unspecified intracranial injury with loss of consciousness status unknown, initial encounter: Secondary | ICD-10-CM

## 2019-04-04 DIAGNOSIS — R42 Dizziness and giddiness: Secondary | ICD-10-CM

## 2019-04-04 DIAGNOSIS — H5509 Other forms of nystagmus: Secondary | ICD-10-CM

## 2019-04-04 MED ORDER — GADOBENATE DIMEGLUMINE 529 MG/ML IV SOLN
18.0000 mL | Freq: Once | INTRAVENOUS | Status: AC | PRN
  Administered 2019-04-04: 18 mL via INTRAVENOUS

## 2019-04-07 DIAGNOSIS — Z23 Encounter for immunization: Secondary | ICD-10-CM | POA: Diagnosis not present

## 2019-04-08 ENCOUNTER — Telehealth: Payer: Self-pay | Admitting: Neurology

## 2019-04-08 NOTE — Telephone Encounter (Signed)
-----   Message from Larey Seat, MD sent at 04/08/2019  8:57 AM EST ----- After the infusion of contrast material, a normal enhancement pattern is noted.  IMPRESSION: This MRI of the brain with and without contrast shows the following: 1.   Few T2/FLAIR hyperintense foci in the hemispheres consistent with minimal chronic microvascular ischemic change, typical for age. 2.   There are no acute findings and there is a normal enhancement pattern. Richard A. Felecia Shelling, MD, PhD, Charlynn Grimes

## 2019-04-08 NOTE — Telephone Encounter (Signed)
Called the patient and reviewed the MRI report with her. Informed that was a normal finding. There was no concern present. Advised tiny microvascular changes but these can appear as age. Pt verbalized understanding.

## 2019-04-08 NOTE — Progress Notes (Signed)
After the infusion of contrast material, a normal enhancement pattern is noted.  IMPRESSION: This MRI of the brain with and without contrast shows the following: 1.   Few T2/FLAIR hyperintense foci in the hemispheres consistent with minimal chronic microvascular ischemic change, typical for age. 2.   There are no acute findings and there is a normal enhancement pattern. Richard A. Felecia Shelling, MD, PhD, Charlynn Grimes

## 2019-04-11 ENCOUNTER — Ambulatory Visit: Payer: Medicare Other | Attending: Neurology

## 2019-04-11 ENCOUNTER — Other Ambulatory Visit: Payer: Self-pay

## 2019-04-11 DIAGNOSIS — R42 Dizziness and giddiness: Secondary | ICD-10-CM | POA: Diagnosis not present

## 2019-04-11 DIAGNOSIS — H8111 Benign paroxysmal vertigo, right ear: Secondary | ICD-10-CM | POA: Diagnosis not present

## 2019-04-11 DIAGNOSIS — R2689 Other abnormalities of gait and mobility: Secondary | ICD-10-CM | POA: Insufficient documentation

## 2019-04-11 NOTE — Therapy (Signed)
[facility] [address] [place], [name], [address] Phone: [phone]   Fax:  [phone]  Physical Therapy Treatment  Patient Details  Name: Debra Harris MRN: [id number] Date of Birth: 08/16/1950 Referring Provider (PT): Dr. Brett Fairy   Encounter Date: 04/11/2019  PT End of Session - 04/11/19 1503    Visit Number  2    Number of Visits  5    Date for PT Re-Evaluation  05/27/19    Authorization Type  Medicare and [employer] secondary    PT Start Time  1403    PT Stop Time  1449    PT Time Calculation (min)  46 min    Equipment Utilized During Treatment  Other (comment)   min guard to S and SOT harness   Activity Tolerance  Patient tolerated treatment well    Behavior During Therapy  [facility] for tasks assessed/performed       Past Medical History:  Diagnosis Date  . Adenomatous colon polyp   . Allergic rhinitis   . Arthritis   . Back pain   . Constipation   . Diabetes mellitus   . Diverticulosis   . External hemorrhoids   . Fibromyalgia   . Gallbladder problem   . GERD (gastroesophageal reflux disease)   . Glaucoma   . Glaucoma   . Hemorrhoids    grade 3   . Hip pain   . IBS (irritable bowel syndrome)   . IDA (iron deficiency anemia)   . Iron deficiency anemia 06/05/2014  . Irregular heartbeat   . Leg edema   . Leg numbness   . Leg pain   . Low tension glaucoma   . Neuropathy, leg   . Ovarian cyst 11/20/00  . Prediabetes   . Sleep apnea    CPAP  . Sliding hiatal hernia     Past Surgical History:  Procedure Laterality Date  . CARPAL TUNNEL RELEASE Bilateral   . CATARACT EXTRACTION     bilateral  . CHOLECYSTECTOMY    . COLONOSCOPY  2005   Small external hemorrhoids, single sigmoid colon diverticulum, small polyp at the hepatic flexure focally adenomatous.  . COLONOSCOPY  2010   Dr. Gala Romney, Versed 4/Demerol 75. Pancolonic diverticula, external hemorrhoids. Colonoscopy every 5 years for  history of adenomatous polyps  . COLONOSCOPY  2014   Dr. Olevia Perches: Propofol. mild diverticulosis in the sigmoid colon, random colon biopsies benign.  . COLONOSCOPY WITH PROPOFOL N/A 09/13/2015   Procedure: COLONOSCOPY WITH PROPOFOL;  Surgeon: Daneil Dolin, MD;  [facility];  Service: Endoscopy;  Laterality: N/A;  1030  . ESOPHAGEAL DILATION N/A 10/26/2014   Procedure: ESOPHAGEAL DILATION [place];  Surgeon: Daneil Dolin, MD;  Location: [facility];  Service: Endoscopy;  Laterality: N/A;  . ESOPHAGOGASTRODUODENOSCOPY  2010   Dr. Gala Romney, distal esophageal erosions, Venia Minks dilator passed. Comment small hiatal hernia, patulous EG junction.  . ESOPHAGOGASTRODUODENOSCOPY (EGD) WITH PROPOFOL N/A 10/26/2014   Dr.Rourk- distal esophageal erosions c/w mild erosive reflux esophagitis. hiatal hernia, o/w normal EGD  . ESOPHAGOGASTRODUODENOSCOPY (EGD) WITH PROPOFOL N/A 09/13/2015   Procedure: ESOPHAGOGASTRODUODENOSCOPY (EGD) WITH PROPOFOL;  Surgeon: Daneil Dolin, MD;  [facility]: [facility];  Service: Endoscopy;  Laterality: N/A;  . GIVENS CAPSULE STUDY N/A 10/26/2014   couple of mucosal breaks/erosions but essentially unremarkable small bowel study  . HEMORRHOID BANDING    . KNEE SURGERY     right-arthroscopy  . OOPHORECTOMY  age 29s  .  PARTIAL HYSTERECTOMY  age 49s  . RECTAL EXAM UNDER ANESTHESIA N/A 10/26/2014   circumferential grade 3 hemoorrhoids easily reducible  . thumb surger Bilateral    trigger thumb    There were no vitals filed for this visit.  Subjective Assessment - 04/11/19 1406    Subjective  Pt denied falls or changes since last visit.    Pertinent History  DM (but pt reported she doesn't need medication), hx of dizziness, LBP, arthritis, BLE neuropathy, fibromyalgia, sleep apnea with CPAP, iron-deficiency anemia    Patient Stated Goals  I want to know if it's getting better, make sure everything is ok, function like before    Currently in Pain?  Yes    Pain Score  --    1-2/10   Pain Location  Head    Pain Orientation  Posterior    Pain Descriptors / Indicators  Headache    Pain Type  Chronic pain    Pain Onset  More than a month ago    Pain Frequency  Intermittent    Aggravating Factors   nothing    Pain Relieving Factors  nothing         [facility] PT Assessment - 04/11/19 1440      Functional Gait  Assessment   Gait assessed   Yes    Gait Level Surface  Walks 20 ft in less than 5.5 sec, no assistive devices, good speed, no evidence for imbalance, normal gait pattern, deviates no more than 6 in outside of the 12 in walkway width.   5.15sec.   Change in Gait Speed  Able to smoothly change walking speed without loss of balance or gait deviation. Deviate no more than 6 in outside of the 12 in walkway width.    Gait with Horizontal Head Turns  Performs head turns smoothly with slight change in gait velocity (eg, minor disruption to smooth gait path), deviates 6-10 in outside 12 in walkway width, or uses an assistive device.    Gait with Vertical Head Turns  Performs task with slight change in gait velocity (eg, minor disruption to smooth gait path), deviates 6 - 10 in outside 12 in walkway width or uses assistive device    Gait and Pivot Turn  Pivot turns safely within 3 sec and stops quickly with no loss of balance.    Step Over Obstacle  Is able to step over 2 stacked shoe boxes taped together (9 in total height) without changing gait speed. No evidence of imbalance.    Gait with Narrow Base of Support  Ambulates 7-9 steps.   7 steps   Gait with Eyes Closed  Walks 20 ft, slow speed, abnormal gait pattern, evidence for imbalance, deviates 10-15 in outside 12 in walkway width. Requires more than 9 sec to ambulate 20 ft.    Ambulating Backwards  Walks 20 ft, uses assistive device, slower speed, mild gait deviations, deviates 6-10 in outside 12 in walkway width.    Steps  Alternating feet, must use rail.    Total Score  23    FGA comment:  23/30: medium falls  risk.         Vestibular Assessment - 04/11/19 1408      Positional Testing   Dix-Hallpike  Dix-Hallpike Right      Dix-Hallpike Right   Dix-Hallpike Right Duration  0    Dix-Hallpike Right Symptoms  No nystagmus      NMR:  Access Code: [email or web address] URL: [email or web address] Date: 04/11/2019 Prepared by:  Geoffry Paradise  Exercises MODIFIED Tandem Stance with Support - 3 reps - 1 sets - 1x daily - 5x weekly Standing Single Leg Stance with Unilateral Counter Support - 3 reps - 1 sets - 1x daily - 5x weekly Romberg Stance with Eyes Closed and head turns/nods x10 reps/direction- 3 reps - 1 sets - 1x daily - 5x weekly  Performed in corner with min guard to S for safety. Cues and demo for technique. Pt reported wooziness during session but denied spinning sensation.   Neuro re-ed: sensory organization test performed with following results: Conditions: 1: 3 trials WNL. 2: 3 trials below normal 3: 3 trials WNL  4: 3 trials below normal 5: 2 trials WNL and 1 trial below normal 6: 2 trials WNL and 1 trial below normal Composite score: 66 (below average for age group) Sensory Analysis Som: below normal ~85 Vis: Below normal ~76 Vest: Below normal ~51 Pref: WNL Strategy analysis: Good use of hip/ankle strategies but reduced hip/ankle strategies noted during conditions 4,5, and 6.       COG alignment: L anterior bias.                         PT Education - 04/11/19 1503    Education Details  PT edcuated pt on outcome measure results and provided pt with balance HEP.    Person(s) Educated  Patient    Methods  Explanation;Demonstration;Verbal cues;Handout    Comprehension  Returned demonstration;Verbalized understanding       PT Short Term Goals - 03/28/19 1419      PT SHORT TERM GOAL #1   Title  same as LTGs        PT Long Term Goals - 04/11/19 1510      PT LONG TERM GOAL #1   Title  Pt will report 0/10 dizziness during all mobility to  improve safety during all mobility. TARGET DATE FOR ALL LTGS:04/25/19    Baseline  4-5/10 average    Time  4    Period  Weeks    Status  New      PT LONG TERM GOAL #2   Title  Perform SOT and FGA and write goals as indicated.    Time  4    Period  Weeks    Status  Achieved      PT LONG TERM GOAL #3   Title  Pt will amb. 1000' over even/uneven terrain, IND, performing head turns and 180 turns with dizziness <1/10 to improve functional mobility.    Baseline  100' over even terrain, guarded, slow turns    Time  4    Period  Weeks    Status  New      PT LONG TERM GOAL #4   Title  Pt will report no dizziness during all positional testing to improve safety and QOL.    Baseline  3/10 during R Dix-Hallpike today    Time  4    Period  Weeks    Status  New      PT LONG TERM GOAL #5   Title  Pt will be IND with HEP to improve dizziness and balance.    Time  4    Period  Weeks    Status  New      Additional Long Term Goals   Additional Long Term Goals  Yes      PT LONG TERM GOAL #6   Title  Pt will improve  SOT composite score to WNL (>70) for age group in order to improve functional mobility and dizziness.    Baseline  66    Time  4    Period  Weeks    Status  New      PT LONG TERM GOAL #7   Title  Pt will improve FGA score to >/=29/30 to decr. falls risk.    Baseline  23/30    Time  4    Period  Weeks    Status  New            Plan - 04/11/19 1502    Clinical Impression Statement  Pt's SOT composite score was below normal limits for pt's age group (60-69y/o). Pt's somatosensory, visual, and vestibular input is also below normal limits for pt's age group. Pt's FGA score indicated pt is at medium falls risk. PT initiated balance HEP to improve vestibular input. Pt would continue to benefit from skilled PT to improve safety during functional mobility.    Personal Factors and Comorbidities  Age;Comorbidity 3+    Comorbidities  DM (but pt reported she doesn't need  medication), hx of dizziness, LBP, arthritis, BLE neuropathy, fibromyalgia, sleep apnea with CPAP, iron-deficiency anemia    Examination-Activity Limitations  Bed Mobility;Locomotion Level;Dressing;Stairs;Stand;Transfers    Examination-Participation Restrictions  Yard Work;Driving;Cleaning    Stability/Clinical Decision Making  Evolving/Moderate complexity    Rehab Potential  Good    PT Frequency  1x / week    PT Duration  4 weeks   1 eval + 4 treatments   PT Treatment/Interventions  ADLs/Self Care Home Management;Canalith Repostioning;Therapeutic exercise;Balance training;Therapeutic activities;Manual techniques;Vestibular;Functional mobility training;Stair training;Orthotic Fit/Training;Gait training;DME Instruction;Patient/family education;Cognitive remediation;Neuromuscular re-education    PT Next Visit Plan  Reassess for R pBPPV and treat prn, HEP for x1 viewing. High level balance activities (rockerboard, compliant surfaces, etc)    PT Home Exercise Plan  [id number]    Consulted and Agree with Plan of Care  Patient       Patient will benefit from skilled therapeutic intervention in order to improve the following deficits and impairments:  Abnormal gait, Dizziness, Decreased balance  Visit Diagnosis: BPPV (benign paroxysmal positional vertigo), right  Dizziness and giddiness  Other abnormalities of gait and mobility     Problem List Patient Active Problem List   Diagnosis Date Noted  . Concussion with no loss of consciousness 03/07/2019  . Retrograde memory loss 03/07/2019  . Vertigo due to brain injury ([place]) 03/07/2019  . Nystagmus on abduction 03/07/2019  . New onset of headaches 03/07/2019  . Controlled type 2 diabetes mellitus with complication, without long-term current use of insulin ([place]) 03/07/2019  . Class 2 severe obesity with serious comorbidity and body mass index (BMI) of 35.0 to 35.9 in adult ([place]) 03/07/2019  . Anemia 02/25/2019  . Hemorrhoids 02/25/2019  .  History of hysterectomy 02/25/2019  . Irritable bowel syndrome 02/25/2019  . Closed fracture of nasal bones 07/29/2018  . Obstruction of nasal valve 07/29/2018  . Obesity (BMI 30-39.9) 08/22/2017  . Other fatigue 07/25/2017  . Shortness of breath on exertion 07/25/2017  . Prediabetes 07/25/2017  . Vitamin D deficiency 07/25/2017  . B12 nutritional deficiency 07/25/2017  . Heart murmur 05/10/2016  . Diverticulosis of colon without hemorrhage   . IDA (iron deficiency anemia)   . Rectal bleeding   . History of melena   . History of colonic polyps   . Third degree hemorrhoids   . Reflux esophagitis   . Hiatal  hernia   . Dysphagia, pharyngoesophageal phase   . Adenomatous colon polyp 10/16/2014  . Melena 10/12/2014  . Iron deficiency anemia 06/05/2014  . Seasonal allergies 03/31/2014  . Type 2 diabetes mellitus ([place]) 03/31/2014  . Bilateral dry eyes 01/20/2014  . Blepharochalasis 01/20/2014  . H/O sleep apnea 01/20/2014  . Pseudophakia of both eyes 01/20/2014  . OSA (obstructive sleep apnea) 01/02/2011  . HEMATOCHEZIA 12/28/2008  . ABDOMINAL PAIN, LEFT LOWER QUADRANT 12/28/2008  . COLONIC POLYPS, ADENOMATOUS, HX OF 12/28/2008  . GERD 12/25/2008  . ARTHRITIS 12/25/2008  . LOW BACK PAIN, ACUTE 12/25/2008  . Dysphagia 12/25/2008  . PUD, HX OF 12/25/2008    Kendalynn Wideman L 04/11/2019, 3:12 PM  Geoffry Paradise, PT,DPT 04/11/19 3:18 PM Phone: [phone] Fax: [facility] [phone] [place], [name], [address] Phone: [phone]   Fax:  [phone]  Name: Debra Harris MRN: [id number] Date of Birth: 04-13-1950

## 2019-04-11 NOTE — Patient Instructions (Addendum)
Access Code: [email or web address] URL: [email or web address] Date: 04/11/2019 Prepared by: Geoffry Paradise  Exercises MODIFIED Tandem Stance with Support - 3 reps - 1 sets - 1x daily - 5x weekly Standing Single Leg Stance with Unilateral Counter Support - 3 reps - 1 sets - 1x daily - 5x weekly Romberg Stance with Eyes Closed and head turns/nods x10 reps/direction- 3 reps - 1 sets - 1x daily - 5x weekly

## 2019-04-15 ENCOUNTER — Ambulatory Visit: Payer: Medicare Other

## 2019-04-18 ENCOUNTER — Ambulatory Visit: Payer: Medicare Other

## 2019-04-25 ENCOUNTER — Ambulatory Visit: Payer: Medicare Other

## 2019-05-02 ENCOUNTER — Ambulatory Visit: Payer: Medicare Other | Admitting: Physical Therapy

## 2019-05-05 ENCOUNTER — Other Ambulatory Visit: Payer: Self-pay

## 2019-05-05 ENCOUNTER — Encounter: Payer: Self-pay | Admitting: Neurology

## 2019-05-05 ENCOUNTER — Ambulatory Visit (INDEPENDENT_AMBULATORY_CARE_PROVIDER_SITE_OTHER): Payer: Medicare Other | Admitting: Neurology

## 2019-05-05 VITALS — BP 137/70 | HR 57 | Temp 97.4°F | Ht 64.5 in | Wt 203.0 lb

## 2019-05-05 DIAGNOSIS — S069X9A Unspecified intracranial injury with loss of consciousness of unspecified duration, initial encounter: Secondary | ICD-10-CM | POA: Diagnosis not present

## 2019-05-05 DIAGNOSIS — S060X0A Concussion without loss of consciousness, initial encounter: Secondary | ICD-10-CM

## 2019-05-05 DIAGNOSIS — R519 Headache, unspecified: Secondary | ICD-10-CM | POA: Diagnosis not present

## 2019-05-05 DIAGNOSIS — S069XAA Unspecified intracranial injury with loss of consciousness status unknown, initial encounter: Secondary | ICD-10-CM

## 2019-05-05 DIAGNOSIS — R42 Dizziness and giddiness: Secondary | ICD-10-CM | POA: Diagnosis not present

## 2019-05-05 DIAGNOSIS — Z6835 Body mass index (BMI) 35.0-35.9, adult: Secondary | ICD-10-CM | POA: Diagnosis not present

## 2019-05-05 NOTE — Progress Notes (Signed)
Provider:  Larey Seat, M D  Referring Provider: Asencion Noble, MD Primary Care Physician:  Asencion Noble, MD  Chief Complaint  Patient presents with  . Follow-up    pt alone, rm 11. pt here post MRI. states things are ok.     RV 05-05-2019: I have the pleasure of seeing Debra Harris today in a revisit, the patient had been suffering a concussion probably without loss of consciousness in November 2020.  She is concerned about cognitive difficulties that arose after the concussion.  She did not have as much of a short-term memory loss but more confusion and word finding difficulties.  She also reports that her sleep has been "horrible".  She underwent an MRI of the brain but has ordered with and without contrast and that showed no abnormalities, mild microvascular chronic changes were noted and these are typical for age.  We did not see atrophy, scars inflammation or infiltration.  Reviewed image in the presence of patient.  In the meantime she has taken Valproate for headaches, prescribed by Dr. Leonie Man on all. This has reduced the headaches and is not (yet) inducing any increase in appetite. We are weaning off. She has gained 30 pounds since the concussion.(!).  She has been prediabetic and needs to lose weight -- not gain. MOCA is in normal range, but patient feels subjectively that she is doing not well with memory.   Her PT sessions were d/c after her exercises hurt her knees. The balance exercises helped balance - but hurt the knee joints.   Montreal Cognitive Assessment  05/05/2019  Visuospatial/ Executive (0/5) 4  Naming (0/3) 3  Attention: Read list of digits (0/2) 2  Attention: Read list of letters (0/1) 1  Attention: Serial 7 subtraction starting at 100 (0/3) 3  Language: Repeat phrase (0/2) 2  Language : Fluency (0/1) 1  Abstraction (0/2) 2  Delayed Recall (0/5) 4  Orientation (0/6) 5  Total 27     HPI:  Debra Harris is a 69 y.o. female and was seen upon a referral  from Dr. Willey Blade for a headache evaluation on 03-07-2019. 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  Her daughter works in a Astron[employer] her husband is a part time orthopedist at this time. On November 16th she injured herself with a shuffle trying to remove a plant which snapped back was digging. She immediately developed a big goose egg over her left eye and eyebrow and jokingly stated that she looked like a clean from [employer].  2 days later a hematoma had descended into the left cheekbone and perioral area.  It was then that her daughter made her see another physician who then ordered a CT of the brain which reportedly came back negative.  However her symptoms did not get better as the external injuries resolved.  She states that she now still feels some dizziness- a spinning sensation of vertigo and she indicates a clockwise rotation. She is a CPAP user- and she noted days before the injury that she can get lightheaded when she turns to sleep on the right.  No memory issues, but more confusion and wordfinding. Sleep has been horrible for while, she did well until mid- pandemic, but has become more unsettled , mind is racing at night. More over the last 3 month.  The vertigo didn't help. 12/31/ 2020 she fell down outdoors, on a staircase. She had looked to the side, caught a vertigo suddenly lost her  footing, her balance and she fell backwards. New onset headaches. No bony injuries.    Social history - not a smoker, not a drinker= are cocktail, one at a time. She adheres to a high guaifenesin diet.    Review of Systems: Out of a complete 14 system review, the patient complains of only the following symptoms, and all other reviewed systems are negative.  see above  fibromyalgia in the past - now presentng with vertigo, insomnia, and memory changes.  Postconcussion.  Social History   Socioeconomic History  . Marital status: Married    Spouse name: J. Orland Dec, MD  . Number of  children: 3  . Years of education: Not on file  . Highest education level: Not on file  Occupational History  . Occupation: AUDIOLOGIST, retired  Tobacco Use  . Smoking status: Never Smoker  . Smokeless tobacco: Never Used  . Tobacco comment: Pt reports tried once in 7th grade//ms  Substance and Sexual Activity  . Alcohol use: Not Currently    Alcohol/week: 1.0 standard drinks    Types: 1 Glasses of wine per week    Comment: rare  . Drug use: No  . Sexual activity: Never    Birth control/protection: None  Other Topics Concern  . Not on file  Social History Narrative   3 children & married to orthopedist. 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   Social Determinants of Health   Financial Resource Strain:   . Difficulty of Paying Living Expenses: Not on file  Food Insecurity:   . Worried About Charity fundraiser in the Last Year: Not on file  . Ran Out of Food in the Last Year: Not on file  Transportation Needs:   . Lack of Transportation (Medical): Not on file  . Lack of Transportation (Non-Medical): Not on file  Physical Activity:   . Days of Exercise per Week: Not on file  . Minutes of Exercise per Session: Not on file  Stress:   . Feeling of Stress : Not on file  Social Connections:   . Frequency of Communication with Friends and Family: Not on file  . Frequency of Social Gatherings with Friends and Family: Not on file  . Attends Religious Services: Not on file  . Active Member of Clubs or Organizations: Not on file  . Attends Archivist Meetings: Not on file  . Marital Status: Not on file  Intimate Partner Violence:   . Fear of Current or Ex-Partner: Not on file  . Emotionally Abused: Not on file  . Physically Abused: Not on file  . Sexually Abused: Not on file    Family History  Problem Relation Age of Onset  . Ulcers Mother   . Glaucoma Mother   . Heart disease Mother   . Diabetes Mother   . Depression Mother   . Sudden death Mother    . Obesity Mother   . Glaucoma Maternal Grandmother   . Heart disease Maternal Grandmother   . Heart disease Maternal Grandfather   . Colon cancer Neg Hx     Past Medical History:  Diagnosis Date  . Adenomatous colon polyp   . Allergic rhinitis   . Arthritis   . Back pain   . Constipation   . Diabetes mellitus   . Diverticulosis   . External hemorrhoids   . Fibromyalgia   . Gallbladder problem   . GERD (gastroesophageal reflux disease)   . Glaucoma   . Glaucoma   .  Hemorrhoids    grade 3   . Hip pain   . IBS (irritable bowel syndrome)   . IDA (iron deficiency anemia)   . Iron deficiency anemia 06/05/2014  . Irregular heartbeat   . Leg edema   . Leg numbness   . Leg pain   . Low tension glaucoma   . Neuropathy, leg   . Ovarian cyst 11/20/00  . Prediabetes   . Sleep apnea    CPAP  . Sliding hiatal hernia     Past Surgical History:  Procedure Laterality Date  . CARPAL TUNNEL RELEASE Bilateral   . CATARACT EXTRACTION     bilateral  . CHOLECYSTECTOMY    . COLONOSCOPY  2005   Small external hemorrhoids, single sigmoid colon diverticulum, small polyp at the hepatic flexure focally adenomatous.  . COLONOSCOPY  2010   Dr. Gala Romney, Versed 4/Demerol 75. Pancolonic diverticula, external hemorrhoids. Colonoscopy every 5 years for history of adenomatous polyps  . COLONOSCOPY  2014   Dr. Olevia Perches: Propofol. mild diverticulosis in the sigmoid colon, random colon biopsies benign.  . COLONOSCOPY WITH PROPOFOL N/A 09/13/2015   Procedure: COLONOSCOPY WITH PROPOFOL;  Surgeon: Daneil Dolin, MD;  Location: [facility];  Service: Endoscopy;  Laterality: N/A;  1030  . ESOPHAGEAL DILATION N/A 10/26/2014   Procedure: ESOPHAGEAL DILATION [place];  Surgeon: Daneil Dolin, MD;  Location: [facility];  Service: Endoscopy;  Laterality: N/A;  . ESOPHAGOGASTRODUODENOSCOPY  2010   Dr. Gala Romney, distal esophageal erosions, Venia Minks dilator passed. Comment small hiatal hernia, patulous EG junction.  .  ESOPHAGOGASTRODUODENOSCOPY (EGD) WITH PROPOFOL N/A 10/26/2014   Dr.Rourk- distal esophageal erosions c/w mild erosive reflux esophagitis. hiatal hernia, o/w normal EGD  . ESOPHAGOGASTRODUODENOSCOPY (EGD) WITH PROPOFOL N/A 09/13/2015   Procedure: ESOPHAGOGASTRODUODENOSCOPY (EGD) WITH PROPOFOL;  Surgeon: Daneil Dolin, MD;  Location: [facility];  Service: Endoscopy;  Laterality: N/A;  . GIVENS CAPSULE STUDY N/A 10/26/2014   couple of mucosal breaks/erosions but essentially unremarkable small bowel study  . HEMORRHOID BANDING    . KNEE SURGERY     right-arthroscopy  . OOPHORECTOMY  age 64s  . PARTIAL HYSTERECTOMY  age 89s  . RECTAL EXAM UNDER ANESTHESIA N/A 10/26/2014   circumferential grade 3 hemoorrhoids easily reducible  . thumb surger Bilateral    trigger thumb    Current Outpatient Medications  Medication Sig Dispense Refill  . acetaminophen (TYLENOL) 500 MG tablet Take 500 mg by mouth every 6 (six) hours as needed.    . cetirizine (ZYRTEC) 10 MG tablet Take 10 mg by mouth daily.     . divalproex (DEPAKOTE ER) 500 MG 24 hr tablet Take 1 tablet (500 mg total) by mouth daily. 30 tablet 1  . latanoprost (XALATAN) 0.005 % ophthalmic solution     . Magnesium 400 MG TABS Take 1 tablet by mouth daily.    . Multiple Vitamins-Minerals (MULTIVITAMIN ADULT PO) Take 1 tablet by mouth daily.    . naproxen sodium (ALEVE) 220 MG tablet Take 220 mg by mouth.    . NON FORMULARY CPAP at night    . Wheat Dextrin (BENEFIBER) POWD Take by mouth daily.     No current facility-administered medications for this visit.    Allergies as of 05/05/2019 - Review Complete 05/05/2019  Allergen Reaction Noted  . Injectafer [ferric carboxymaltose]  06/14/2016    Vitals: BP 137/70   Pulse (!) 57   Temp (!) 97.4 F (36.3 C)   Ht 5' 4.5" (1.638 m)  Wt 203 lb (92.1 kg)   BMI 34.31 kg/m  Last Weight:  Wt Readings from Last 1 Encounters:  05/05/19 203 lb (92.1 kg)   Last Height:   Ht Readings from Last  1 Encounters:  05/05/19 5' 4.5" (1.638 m)    Physical exam:  General: The patient is awake, alert and appears not in acute distress. The patient is well groomed. Head: Normocephalic, atraumatic. Neck is supple. Mallampati 2-3/ She has severely congested nasal passages, and noted that it is allergy season for her.  Cardiovascular:  Regular rate and rhythm, without murmurs or carotid bruit, and without distended neck veins. Respiratory: Lungs are clear to auscultation. Skin:  Without evidence of edema, or rash Trunk: BMI is 31 elevated and patient  has normal posture.  Neurologic exam : The patient is awake and alert, oriented to place and time.    Memory subjective  described as impaired.  There is a normal attention span & concentration ability.  Speech is fluent without  dysarthria, but dysphonia or aphasia.  Mood and affect are appropriate.  Cranial nerves: Pupils are equal and briskly reactive to light Extraocular movements  in vertical and horizontal planes  with nystagmus, with lateral gaze. .  Visual fields by finger perimetry are intact. Hearing to finger rub intact.  Facial sensation intact to fine touch. Facial motor strength is symmetric and tongue and uvula move midline.  Tongue protrusion into either cheek is normal. Shoulder shrug is normal.   Motor exam: Normal tone ,muscle bulk and symmetric strength in all extremities. Her grip strength is affected by arthritis, pain and limited ROM.  Sensory:  Fine touch, pinprick and vibration were normal. She reports leg tingling and numbness in both legs.  Coordination: Rapid alternating movements in the fingers/hands were normal.  Finger-to-nose maneuver  normal without evidence of ataxia, dysmetria or tremor. Gait and station: Patient walks without assistive device and is able unassisted to climb up to the exam table. Strength within normal limits.  Stance is stable and normal. \Tandem gait is unfragmented. Romberg testing is  positive.   Deep tendon reflexes: in the  upper and lower extremities are symmetric and intact. Babinski maneuver response is  downgoing.    Reviewed MRI brain with and without/ normal.   I spent a total time of 25 minutes with this patient -  Assessment:  After physical and neurologic examination, review of laboratory studies, imaging, neurophysiology testing and pre-existing records, assessment is that of :   Concussion does  explain Vertigo, postraumatic, positional dependent.  This I has resolved.   Memory dysfunction  Is not objectively present- but subjectively present. MOCA 27/30   Headaches had been migrainous , helped by Depakote PO, now gaining weight. Has to change to a differenet preventive .  Her current headaches are SINUS related.   Plan:  Treatment plan and additional workup : Concussion is slowly improving.  Daily claritin or allegra - for sinusitis prevention.  Memory has somewhat improved- she is especially concerned about mathematics- doing addition in her head, and her previously very distinct talent of colour- memory.  I like for her to consider the offer of seeing a neuropsychologist.  Her OSA reportedly is controlled on CPAP and she is using a 69 year old auto-machine. [facility].   RV prn.  Asencion Partridge [place] MD 05/05/2019

## 2019-05-05 NOTE — Patient Instructions (Signed)

## 2019-06-02 DIAGNOSIS — H401132 Primary open-angle glaucoma, bilateral, moderate stage: Secondary | ICD-10-CM | POA: Diagnosis not present

## 2019-06-02 DIAGNOSIS — Z961 Presence of intraocular lens: Secondary | ICD-10-CM | POA: Diagnosis not present

## 2019-06-02 DIAGNOSIS — D3131 Benign neoplasm of right choroid: Secondary | ICD-10-CM | POA: Diagnosis not present

## 2019-06-02 DIAGNOSIS — E119 Type 2 diabetes mellitus without complications: Secondary | ICD-10-CM | POA: Diagnosis not present

## 2019-06-02 DIAGNOSIS — H04123 Dry eye syndrome of bilateral lacrimal glands: Secondary | ICD-10-CM | POA: Diagnosis not present

## 2019-06-02 DIAGNOSIS — H43813 Vitreous degeneration, bilateral: Secondary | ICD-10-CM | POA: Diagnosis not present

## 2019-07-01 DIAGNOSIS — E119 Type 2 diabetes mellitus without complications: Secondary | ICD-10-CM | POA: Diagnosis not present

## 2019-07-01 DIAGNOSIS — R5383 Other fatigue: Secondary | ICD-10-CM | POA: Diagnosis not present

## 2019-07-04 DIAGNOSIS — D509 Iron deficiency anemia, unspecified: Secondary | ICD-10-CM | POA: Diagnosis not present

## 2019-07-04 DIAGNOSIS — E1140 Type 2 diabetes mellitus with diabetic neuropathy, unspecified: Secondary | ICD-10-CM | POA: Diagnosis not present

## 2019-07-04 DIAGNOSIS — Z6833 Body mass index (BMI) 33.0-33.9, adult: Secondary | ICD-10-CM | POA: Diagnosis not present

## 2019-07-17 ENCOUNTER — Telehealth: Payer: Self-pay | Admitting: Orthopedic Surgery

## 2019-07-17 ENCOUNTER — Other Ambulatory Visit: Payer: Self-pay | Admitting: Radiology

## 2019-07-17 MED ORDER — HYDROCODONE-ACETAMINOPHEN 5-325 MG PO TABS: 1.0000 | ORAL_TABLET | Freq: Four times a day (QID) | ORAL | 0 refills | Status: DC | PRN

## 2019-07-17 NOTE — Telephone Encounter (Signed)
Patient would like refill on Hydrocodone

## 2019-07-17 NOTE — Telephone Encounter (Signed)
[facility] called and stated they had a question regarding the Hydrocodone that Dr. Aline Brochure sent in for this patient.  Please call the doctor line at [facility]  Thanks

## 2019-10-08 ENCOUNTER — Other Ambulatory Visit (HOSPITAL_COMMUNITY): Payer: Self-pay | Admitting: *Deleted

## 2019-10-08 DIAGNOSIS — D500 Iron deficiency anemia secondary to blood loss (chronic): Secondary | ICD-10-CM

## 2019-10-08 NOTE — Progress Notes (Signed)
Pt called stating that she feels like she needs her labs checked sooner than her October appointment. She states that she is having some bleeding and wants to have her labs done sooner. Lab order put in and message sent to scheduler to schedule appointments for labs and follow up with Francene Finders NP.

## 2019-10-09 ENCOUNTER — Other Ambulatory Visit: Payer: Self-pay

## 2019-10-09 ENCOUNTER — Inpatient Hospital Stay (HOSPITAL_COMMUNITY): Payer: Medicare Other | Attending: Hematology

## 2019-10-09 DIAGNOSIS — D509 Iron deficiency anemia, unspecified: Secondary | ICD-10-CM | POA: Insufficient documentation

## 2019-10-09 DIAGNOSIS — D500 Iron deficiency anemia secondary to blood loss (chronic): Secondary | ICD-10-CM

## 2019-10-09 LAB — CBC WITH DIFFERENTIAL/PLATELET
Abs Immature Granulocytes: 0.03 10*3/uL (ref 0.00–0.07)
Basophils Absolute: 0 10*3/uL (ref 0.0–0.1)
Basophils Relative: 1 %
Eosinophils Absolute: 0.1 10*3/uL (ref 0.0–0.5)
Eosinophils Relative: 1 %
HCT: 41 % (ref 36.0–46.0)
Hemoglobin: 13.1 g/dL (ref 12.0–15.0)
Immature Granulocytes: 0 %
Lymphocytes Relative: 18 %
Lymphs Abs: 1.2 10*3/uL (ref 0.7–4.0)
MCH: 27.8 pg (ref 26.0–34.0)
MCHC: 32 g/dL (ref 30.0–36.0)
MCV: 87 fL (ref 80.0–100.0)
Monocytes Absolute: 0.5 10*3/uL (ref 0.1–1.0)
Monocytes Relative: 8 %
Neutro Abs: 4.8 10*3/uL (ref 1.7–7.7)
Neutrophils Relative %: 72 %
Platelets: 219 10*3/uL (ref 150–400)
RBC: 4.71 MIL/uL (ref 3.87–5.11)
RDW: 13.5 % (ref 11.5–15.5)
WBC: 6.7 10*3/uL (ref 4.0–10.5)
nRBC: 0 % (ref 0.0–0.2)

## 2019-10-09 LAB — COMPREHENSIVE METABOLIC PANEL
ALT: 25 U/L (ref 0–44)
AST: 23 U/L (ref 15–41)
Albumin: 4.2 g/dL (ref 3.5–5.0)
Alkaline Phosphatase: 61 U/L (ref 38–126)
Anion gap: 10 (ref 5–15)
BUN: 17 mg/dL (ref 8–23)
CO2: 27 mmol/L (ref 22–32)
Calcium: 9.4 mg/dL (ref 8.9–10.3)
Chloride: 101 mmol/L (ref 98–111)
Creatinine, Ser: 0.76 mg/dL (ref 0.44–1.00)
GFR calc Af Amer: >60 mL/min (ref >60)
GFR calc non Af Amer: >60 mL/min (ref >60)
Glucose, Bld: 114 mg/dL — ABNORMAL HIGH (ref 70–99)
Potassium: 3.8 mmol/L (ref 3.5–5.1)
Sodium: 138 mmol/L (ref 135–145)
Total Bilirubin: 0.8 mg/dL (ref 0.3–1.2)
Total Protein: 7.7 g/dL (ref 6.5–8.1)

## 2019-10-09 LAB — LACTATE DEHYDROGENASE: LDH: 161 U/L (ref 98–192)

## 2019-10-09 LAB — IRON AND TIBC
Iron: 63 ug/dL (ref 28–170)
Saturation Ratios: 13 % (ref 10.4–31.8)
TIBC: 483 ug/dL — ABNORMAL HIGH (ref 250–450)
UIBC: 420 ug/dL

## 2019-10-09 LAB — FERRITIN: Ferritin: 20 ng/mL (ref 11–307)

## 2019-10-17 DIAGNOSIS — Z20822 Contact with and (suspected) exposure to covid-19: Secondary | ICD-10-CM | POA: Diagnosis not present

## 2019-10-27 DIAGNOSIS — H43813 Vitreous degeneration, bilateral: Secondary | ICD-10-CM | POA: Diagnosis not present

## 2019-10-27 DIAGNOSIS — E119 Type 2 diabetes mellitus without complications: Secondary | ICD-10-CM | POA: Diagnosis not present

## 2019-10-27 DIAGNOSIS — D3131 Benign neoplasm of right choroid: Secondary | ICD-10-CM | POA: Diagnosis not present

## 2019-10-27 DIAGNOSIS — H401132 Primary open-angle glaucoma, bilateral, moderate stage: Secondary | ICD-10-CM | POA: Diagnosis not present

## 2019-10-27 DIAGNOSIS — H04123 Dry eye syndrome of bilateral lacrimal glands: Secondary | ICD-10-CM | POA: Diagnosis not present

## 2019-10-27 DIAGNOSIS — Z961 Presence of intraocular lens: Secondary | ICD-10-CM | POA: Diagnosis not present

## 2019-10-29 ENCOUNTER — Inpatient Hospital Stay (HOSPITAL_COMMUNITY): Payer: Medicare Other | Attending: Hematology | Admitting: Nurse Practitioner

## 2019-10-29 ENCOUNTER — Other Ambulatory Visit: Payer: Self-pay

## 2019-10-29 DIAGNOSIS — K909 Intestinal malabsorption, unspecified: Secondary | ICD-10-CM | POA: Insufficient documentation

## 2019-10-29 DIAGNOSIS — E119 Type 2 diabetes mellitus without complications: Secondary | ICD-10-CM | POA: Diagnosis not present

## 2019-10-29 DIAGNOSIS — D508 Other iron deficiency anemias: Secondary | ICD-10-CM | POA: Diagnosis not present

## 2019-10-29 DIAGNOSIS — D509 Iron deficiency anemia, unspecified: Secondary | ICD-10-CM | POA: Insufficient documentation

## 2019-10-29 NOTE — Assessment & Plan Note (Signed)
1. Iron deficiency anemia: -Iron deficiency state from combination of malabsorption and chronic GI loss -Colonoscopy on 09/13/2015 showing diverticuli in the sigmoid colon, descending colon and transverse colon, internal and external hemorrhoids banded. -EGD on 09/13/2015 showing normal esophagus, small hiatal hernia. -Last Feraheme infusion was 04-24-2018. -Labs done on 10-09-2019 showed hemoglobin 13.0, ferritin 20, percent saturation 13. -She reports her fatigue has increased significantly. We'll give her 2 iron infusions -We'll see her back in 4 months with repeat labs.

## 2019-10-29 NOTE — Progress Notes (Signed)
[place], [place] [address]   CLINIC:  Medical Oncology/Hematology  PCP:  Asencion Noble, MD [address] [name] [address] [phone]   REASON FOR VISIT: Follow-up for iron deficiency anemia   CURRENT THERAPY: Intermittent iron infusions   INTERVAL HISTORY:  [name] 69 y.o. female returns for routine follow-up for iron deficiency anemia. Patient reports she has increasing fatigue since her last visit. She does report a few bleeding episodes in her stool. They have since stopped. Denies any nausea, vomiting, or diarrhea. Denies any new pains. Had not noticed any recent bleeding such as epistaxis, hematuria or hematochezia. Denies recent chest pain on exertion, shortness of breath on minimal exertion, pre-syncopal episodes, or palpitations. Denies any numbness or tingling in hands or feet. Denies any recent fevers, infections, or recent hospitalizations. Patient reports appetite at 100% and energy level at 75%. She is eating well maintaining her weight this time.    REVIEW OF SYSTEMS:  Review of Systems  Constitutional: Positive for fatigue.  HENT:   Positive for trouble swallowing.   Neurological: Positive for headaches and numbness.  All other systems reviewed and are negative.    PAST MEDICAL/SURGICAL HISTORY:  Past Medical History:  Diagnosis Date  . Adenomatous colon polyp   . Allergic rhinitis   . Arthritis   . Back pain   . Constipation   . Diabetes mellitus   . Diverticulosis   . External hemorrhoids   . Fibromyalgia   . Gallbladder problem   . GERD (gastroesophageal reflux disease)   . Glaucoma   . Glaucoma   . Hemorrhoids    grade 3   . Hip pain   . IBS (irritable bowel syndrome)   . IDA (iron deficiency anemia)   . Iron deficiency anemia 06/05/2014  . Irregular heartbeat   . Leg edema   . Leg numbness   . Leg pain   . Low tension glaucoma   . Neuropathy, leg   . Ovarian cyst 11/20/00  .  Prediabetes   . Sleep apnea    CPAP  . Sliding hiatal hernia    Past Surgical History:  Procedure Laterality Date  . CARPAL TUNNEL RELEASE Bilateral   . CATARACT EXTRACTION     bilateral  . CHOLECYSTECTOMY    . COLONOSCOPY  2005   Small external hemorrhoids, single sigmoid colon diverticulum, small polyp at the hepatic flexure focally adenomatous.  . COLONOSCOPY  2010   Dr. Gala Romney, Versed 4/Demerol 75. Pancolonic diverticula, external hemorrhoids. Colonoscopy every 5 years for history of adenomatous polyps  . COLONOSCOPY  2014   Dr. Olevia Perches: Propofol. mild diverticulosis in the sigmoid colon, random colon biopsies benign.  . COLONOSCOPY WITH PROPOFOL N/A 09/13/2015   Procedure: COLONOSCOPY WITH PROPOFOL;  Surgeon: Daneil Dolin, MD;  [facility]: [facility];  Service: Endoscopy;  Laterality: N/A;  1030  . ESOPHAGEAL DILATION N/A 10/26/2014   Procedure: ESOPHAGEAL DILATION [place];  Surgeon: Daneil Dolin, MD;  Location: [facility];  Service: Endoscopy;  Laterality: N/A;  . ESOPHAGOGASTRODUODENOSCOPY  2010   Dr. Gala Romney, distal esophageal erosions, Venia Minks dilator passed. Comment small hiatal hernia, patulous EG junction.  . ESOPHAGOGASTRODUODENOSCOPY (EGD) WITH PROPOFOL N/A 10/26/2014   Dr.Rourk- distal esophageal erosions c/w mild erosive reflux esophagitis. hiatal hernia, o/w normal EGD  . ESOPHAGOGASTRODUODENOSCOPY (EGD) WITH PROPOFOL N/A 09/13/2015   Procedure: ESOPHAGOGASTRODUODENOSCOPY (EGD) WITH PROPOFOL;  Surgeon: Daneil Dolin, MD;  [facility]: [facility];  Service:  Endoscopy;  Laterality: N/A;  . GIVENS CAPSULE STUDY N/A 10/26/2014   couple of mucosal breaks/erosions but essentially unremarkable small bowel study  . HEMORRHOID BANDING    . KNEE SURGERY     right-arthroscopy  . OOPHORECTOMY  age 30s  . PARTIAL HYSTERECTOMY  age 60s  . RECTAL EXAM UNDER ANESTHESIA N/A 10/26/2014   circumferential grade 3 hemoorrhoids easily reducible  . thumb surger Bilateral    trigger thumb      SOCIAL HISTORY:  Social History   Socioeconomic History  . Marital status: Married    Spouse name: J. Orland Dec, MD  . Number of children: 3  . Years of education: Not on file  . Highest education level: Not on file  Occupational History  . Occupation: AUDIOLOGIST, retired  Tobacco Use  . Smoking status: Never Smoker  . Smokeless tobacco: Never Used  . Tobacco comment: Pt reports tried once in 7th grade//ms  Vaping Use  . Vaping Use: Never used  Substance and Sexual Activity  . Alcohol use: Not Currently    Alcohol/week: 1.0 standard drink    Types: 1 Glasses of wine per week    Comment: rare  . Drug use: No  . Sexual activity: Never    Birth control/protection: None  Other Topics Concern  . Not on file  Social History Narrative   3 children & married to orthopedist. 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   Social Determinants of Health   Financial Resource Strain:   . Difficulty of Paying Living Expenses: Not on file  Food Insecurity:   . Worried About Charity fundraiser in the Last Year: Not on file  . Ran Out of Food in the Last Year: Not on file  Transportation Needs:   . Lack of Transportation (Medical): Not on file  . Lack of Transportation (Non-Medical): Not on file  Physical Activity:   . Days of Exercise per Week: Not on file  . Minutes of Exercise per Session: Not on file  Stress:   . Feeling of Stress : Not on file  Social Connections:   . Frequency of Communication with Friends and Family: Not on file  . Frequency of Social Gatherings with Friends and Family: Not on file  . Attends Religious Services: Not on file  . Active Member of Clubs or Organizations: Not on file  . Attends Archivist Meetings: Not on file  . Marital Status: Not on file  Intimate Partner Violence:   . Fear of Current or Ex-Partner: Not on file  . Emotionally Abused: Not on file  . Physically Abused: Not on file  . Sexually Abused: Not on file     FAMILY HISTORY:  Family History  Problem Relation Age of Onset  . Ulcers Mother   . Glaucoma Mother   . Heart disease Mother   . Diabetes Mother   . Depression Mother   . Sudden death Mother   . Obesity Mother   . Glaucoma Maternal Grandmother   . Heart disease Maternal Grandmother   . Heart disease Maternal Grandfather   . Colon cancer Neg Hx     CURRENT MEDICATIONS:  Outpatient Encounter Medications as of 10/29/2019  Medication Sig  . acetaminophen (TYLENOL) 500 MG tablet Take 500 mg by mouth every 6 (six) hours as needed.  . cetirizine (ZYRTEC) 10 MG tablet Take 10 mg by mouth daily.   Marland Kitchen latanoprost (XALATAN) 0.005 % ophthalmic solution   . Magnesium 400  MG TABS Take 1 tablet by mouth daily.  . Multiple Vitamins-Minerals (MULTIVITAMIN ADULT PO) Take 1 tablet by mouth daily.  . naproxen sodium (ALEVE) 220 MG tablet Take 220 mg by mouth.  . timolol (TIMOPTIC) 0.5 % ophthalmic solution 1 drop daily.  . Wheat Dextrin (BENEFIBER) POWD Take by mouth daily.  . [DISCONTINUED] divalproex (DEPAKOTE ER) 500 MG 24 hr tablet Take 1 tablet (500 mg total) by mouth daily.  Marland Kitchen HYDROcodone-acetaminophen (NORCO/VICODIN) 5-325 MG tablet Take 1 tablet by mouth every 6 (six) hours as needed for moderate pain. (Patient not taking: Reported on 10/29/2019)  . NON FORMULARY CPAP at night (Patient not taking: Reported on 10/29/2019)   No facility-administered encounter medications on file as of 10/29/2019.    ALLERGIES:  Allergies  Allergen Reactions  . Injectafer [Ferric Carboxymaltose]     Has tolerated feraheme without problems     PHYSICAL EXAM:  ECOG Performance status: 1  Vitals:   10/29/19 1023  BP: 132/68  Pulse: 66  Resp: 18  Temp: (!) 96.9 F (36.1 C)  SpO2: 100%   Filed Weights   10/29/19 1023  Weight: 206 lb 6.4 oz (93.6 kg)   Physical Exam Constitutional:      Appearance: Normal appearance. She is normal weight.  Cardiovascular:     Rate and Rhythm: Normal rate and  regular rhythm.     Heart sounds: Normal heart sounds.  Pulmonary:     Effort: Pulmonary effort is normal.     Breath sounds: Normal breath sounds.  Abdominal:     General: Bowel sounds are normal.     Palpations: Abdomen is soft.  Musculoskeletal:        General: Normal range of motion.  Skin:    General: Skin is warm.  Neurological:     Mental Status: She is alert and oriented to person, place, and time. Mental status is at baseline.  Psychiatric:        Mood and Affect: Mood normal.        Behavior: Behavior normal.        Thought Content: Thought content normal.        Judgment: Judgment normal.      LABORATORY DATA:  I have reviewed the labs as listed.  CBC    Component Value Date/Time   WBC 6.7 10/09/2019 1415   RBC 4.71 10/09/2019 1415   HGB 13.1 10/09/2019 1415   HCT 41.0 10/09/2019 1415   PLT 219 10/09/2019 1415   MCV 87.0 10/09/2019 1415   MCH 27.8 10/09/2019 1415   MCHC 32.0 10/09/2019 1415   RDW 13.5 10/09/2019 1415   LYMPHSABS 1.2 10/09/2019 1415   MONOABS 0.5 10/09/2019 1415   EOSABS 0.1 10/09/2019 1415   BASOSABS 0.0 10/09/2019 1415   CMP Latest Ref Rng & Units 10/09/2019 12/17/2018 08/12/2018  Glucose 70 - 99 mg/dL 114(H) 97 110(H)  BUN 8 - 23 mg/dL 17 19 16   Creatinine 0.44 - 1.00 mg/dL 0.76 0.64 0.57  Sodium 135 - 145 mmol/L 138 140 142  Potassium 3.5 - 5.1 mmol/L 3.8 4.3 3.9  Chloride 98 - 111 mmol/L 101 104 103  CO2 22 - 32 mmol/L 27 28 27   Calcium 8.9 - 10.3 mg/dL 9.4 9.4 9.3  Total Protein 6.5 - 8.1 g/dL 7.7 7.1 7.1  Total Bilirubin 0.3 - 1.2 mg/dL 0.8 0.9 1.0  Alkaline Phos 38 - 126 U/L 61 52 45  AST 15 - 41 U/L 23 17 19   ALT 0 -  44 U/L 25 20 20    All questions were answered to patient's stated satisfaction. Encouraged patient to call with any new concerns or questions before his next visit to the cancer center and we can certain see him sooner, if needed.     ASSESSMENT & PLAN:  Iron deficiency anemia 1. Iron deficiency  anemia: -Iron deficiency state from combination of malabsorption and chronic GI loss -Colonoscopy on 09/13/2015 showing diverticuli in the sigmoid colon, descending colon and transverse colon, internal and external hemorrhoids banded. -EGD on 09/13/2015 showing normal esophagus, small hiatal hernia. -Last Feraheme infusion was 04-24-2018. -Labs done on 10-09-2019 showed hemoglobin 13.0, ferritin 20, percent saturation 13. -She reports her fatigue has increased significantly. We'll give her 2 iron infusions -We'll see her back in 4 months with repeat labs.     Orders placed this encounter:  Orders Placed This Encounter  Procedures  . CBC with Differential/Platelet  . Comprehensive metabolic panel  . Ferritin  . Iron and TIBC  . Lactate dehydrogenase  . Vitamin B12  . VITAMIN D 25 Hydroxy (Vit-D Deficiency, Fractures)      Francene Finders, FNP-C [place] [phone]

## 2019-10-30 DIAGNOSIS — E1140 Type 2 diabetes mellitus with diabetic neuropathy, unspecified: Secondary | ICD-10-CM | POA: Diagnosis not present

## 2019-10-31 ENCOUNTER — Inpatient Hospital Stay (HOSPITAL_COMMUNITY): Payer: Medicare Other

## 2019-10-31 ENCOUNTER — Other Ambulatory Visit: Payer: Self-pay

## 2019-10-31 ENCOUNTER — Encounter (HOSPITAL_COMMUNITY): Payer: Self-pay

## 2019-10-31 VITALS — BP 127/75 | HR 55 | Temp 96.6°F | Resp 16

## 2019-10-31 DIAGNOSIS — D509 Iron deficiency anemia, unspecified: Secondary | ICD-10-CM | POA: Diagnosis not present

## 2019-10-31 DIAGNOSIS — K625 Hemorrhage of anus and rectum: Secondary | ICD-10-CM

## 2019-10-31 DIAGNOSIS — E119 Type 2 diabetes mellitus without complications: Secondary | ICD-10-CM | POA: Diagnosis not present

## 2019-10-31 DIAGNOSIS — D500 Iron deficiency anemia secondary to blood loss (chronic): Secondary | ICD-10-CM

## 2019-10-31 DIAGNOSIS — K909 Intestinal malabsorption, unspecified: Secondary | ICD-10-CM | POA: Diagnosis not present

## 2019-10-31 MED ORDER — SODIUM CHLORIDE 0.9 % IV SOLN
510.0000 mg | Freq: Once | INTRAVENOUS | Status: AC
  Administered 2019-10-31: 510 mg via INTRAVENOUS
  Filled 2019-10-31: qty 510

## 2019-10-31 MED ORDER — SODIUM CHLORIDE 0.9 % IV SOLN: Freq: Once | INTRAVENOUS | Status: AC

## 2019-10-31 NOTE — Progress Notes (Signed)
Patient presents today for Feraheme infusion. MAR reviewed and updated. Vital signs stable. Patient has no complaint of any pain today. Patient has complaints of fatigue. Patient denies any significant changes since her last visit.   Feraheme iven today per MD orders. Tolerated infusion without adverse affects. Vital signs stable. No complaints at this time. Discharged from clinic ambulatory. F/U with [facility] as scheduled.

## 2019-10-31 NOTE — Patient Instructions (Signed)
[facility]  Discharge Instructions:   _______________________________________________________________  Thank you for choosing [facility] to provide your oncology and hematology care.  To afford each patient quality time with our providers, please arrive at least 15 minutes before your scheduled appointment.  You need to re-schedule your appointment if you arrive 10 or more minutes late.  We strive to give you quality time with our providers, and arriving late affects you and other patients whose appointments are after yours.  Also, if you no show three or more times for appointments you may be dismissed from the clinic.  Again, thank you for choosing [facility]. Our hope is that these requests will allow you access to exceptional care and in a timely manner. _______________________________________________________________  If you have questions after your visit, please contact our office at [phone] between the hours of 8:30 a.m. and 5:00 p.m. Voicemails left after 4:30 p.m. will not be returned until the following business day. _______________________________________________________________  For prescription refill requests, have your pharmacy contact our office. _______________________________________________________________  Recommendations made by the consultant and any test results will be sent to your referring physician. _______________________________________________________________ 

## 2019-11-06 DIAGNOSIS — Z6836 Body mass index (BMI) 36.0-36.9, adult: Secondary | ICD-10-CM | POA: Diagnosis not present

## 2019-11-06 DIAGNOSIS — Z23 Encounter for immunization: Secondary | ICD-10-CM | POA: Diagnosis not present

## 2019-11-06 DIAGNOSIS — D509 Iron deficiency anemia, unspecified: Secondary | ICD-10-CM | POA: Diagnosis not present

## 2019-11-06 DIAGNOSIS — R7309 Other abnormal glucose: Secondary | ICD-10-CM | POA: Diagnosis not present

## 2019-11-06 DIAGNOSIS — E1140 Type 2 diabetes mellitus with diabetic neuropathy, unspecified: Secondary | ICD-10-CM | POA: Diagnosis not present

## 2019-11-07 ENCOUNTER — Encounter (HOSPITAL_COMMUNITY): Payer: Self-pay

## 2019-11-07 ENCOUNTER — Other Ambulatory Visit: Payer: Self-pay

## 2019-11-07 ENCOUNTER — Inpatient Hospital Stay (HOSPITAL_COMMUNITY): Payer: Medicare Other

## 2019-11-07 VITALS — BP 115/61 | HR 61 | Temp 96.8°F | Resp 18

## 2019-11-07 DIAGNOSIS — K909 Intestinal malabsorption, unspecified: Secondary | ICD-10-CM | POA: Diagnosis not present

## 2019-11-07 DIAGNOSIS — K625 Hemorrhage of anus and rectum: Secondary | ICD-10-CM

## 2019-11-07 DIAGNOSIS — D500 Iron deficiency anemia secondary to blood loss (chronic): Secondary | ICD-10-CM

## 2019-11-07 DIAGNOSIS — E119 Type 2 diabetes mellitus without complications: Secondary | ICD-10-CM | POA: Diagnosis not present

## 2019-11-07 DIAGNOSIS — D509 Iron deficiency anemia, unspecified: Secondary | ICD-10-CM | POA: Diagnosis not present

## 2019-11-07 MED ORDER — SODIUM CHLORIDE 0.9 % IV SOLN: Freq: Once | INTRAVENOUS | Status: AC

## 2019-11-07 MED ORDER — SODIUM CHLORIDE 0.9 % IV SOLN
510.0000 mg | Freq: Once | INTRAVENOUS | Status: AC
  Administered 2019-11-07: 510 mg via INTRAVENOUS
  Filled 2019-11-07: qty 510

## 2019-11-07 NOTE — Progress Notes (Signed)
Tolerated iron today without incidence.  Discharged ambulatory. Vital signs stable prior to discharge.

## 2019-11-07 NOTE — Patient Instructions (Signed)
[facility]  Discharge Instructions:   _______________________________________________________________  Thank you for choosing [facility] at [facility] to provide your oncology and hematology care.  To afford each patient quality time with our providers, please arrive at least 15 minutes before your scheduled appointment.  You need to re-schedule your appointment if you arrive 10 or more minutes late.  We strive to give you quality time with our providers, and arriving late affects you and other patients whose appointments are after yours.  Also, if you no show three or more times for appointments you may be dismissed from the clinic.  Again, thank you for choosing [facility] at [facility]. Our hope is that these requests will allow you access to exceptional care and in a timely manner. _______________________________________________________________  If you have questions after your visit, please contact our office at [phone] between the hours of 8:30 a.m. and 5:00 p.m. Voicemails left after 4:30 p.m. will not be returned until the following business day. _______________________________________________________________  For prescription refill requests, have your pharmacy contact our office. _______________________________________________________________  Recommendations made by the consultant and any test results will be sent to your referring physician. _______________________________________________________________ 

## 2019-11-17 DIAGNOSIS — H401132 Primary open-angle glaucoma, bilateral, moderate stage: Secondary | ICD-10-CM | POA: Diagnosis not present

## 2019-11-17 DIAGNOSIS — Z961 Presence of intraocular lens: Secondary | ICD-10-CM | POA: Diagnosis not present

## 2019-11-17 DIAGNOSIS — H04123 Dry eye syndrome of bilateral lacrimal glands: Secondary | ICD-10-CM | POA: Diagnosis not present

## 2019-11-17 DIAGNOSIS — H43813 Vitreous degeneration, bilateral: Secondary | ICD-10-CM | POA: Diagnosis not present

## 2019-11-17 DIAGNOSIS — E119 Type 2 diabetes mellitus without complications: Secondary | ICD-10-CM | POA: Diagnosis not present

## 2019-11-17 DIAGNOSIS — D3131 Benign neoplasm of right choroid: Secondary | ICD-10-CM | POA: Diagnosis not present

## 2019-12-16 ENCOUNTER — Other Ambulatory Visit (HOSPITAL_COMMUNITY): Payer: Medicare Other

## 2019-12-17 ENCOUNTER — Ambulatory Visit (INDEPENDENT_AMBULATORY_CARE_PROVIDER_SITE_OTHER): Payer: Medicare Other | Admitting: Pulmonary Disease

## 2019-12-17 ENCOUNTER — Other Ambulatory Visit: Payer: Self-pay

## 2019-12-17 ENCOUNTER — Encounter: Payer: Self-pay | Admitting: Pulmonary Disease

## 2019-12-17 VITALS — BP 114/70 | HR 50 | Temp 97.5°F | Ht 64.0 in | Wt 206.0 lb

## 2019-12-17 DIAGNOSIS — R198 Other specified symptoms and signs involving the digestive system and abdomen: Secondary | ICD-10-CM | POA: Diagnosis not present

## 2019-12-17 DIAGNOSIS — G4730 Sleep apnea, unspecified: Secondary | ICD-10-CM | POA: Diagnosis not present

## 2019-12-17 DIAGNOSIS — R0989 Other specified symptoms and signs involving the circulatory and respiratory systems: Secondary | ICD-10-CM

## 2019-12-17 DIAGNOSIS — E669 Obesity, unspecified: Secondary | ICD-10-CM

## 2019-12-17 DIAGNOSIS — G4733 Obstructive sleep apnea (adult) (pediatric): Secondary | ICD-10-CM

## 2019-12-17 DIAGNOSIS — R09A2 Foreign body sensation, throat: Secondary | ICD-10-CM

## 2019-12-17 NOTE — Patient Instructions (Signed)
Follow up in 1 year.

## 2019-12-17 NOTE — Progress Notes (Signed)
[facility]  Chief Complaint  Patient presents with  . Follow-up    shortness of breath when lays down in bed    Constitutional:  BP 114/70 (BP Location: Left Arm, Cuff Size: Normal)   Pulse (!) 50   Temp (!) 97.5 F (36.4 C) (Other (Comment)) Comment (Src): wrist  Ht 5\' 4"  (1.626 m)   Wt 206 lb (93.4 kg)   SpO2 98% Comment: Room air  BMI 35.36 kg/m   Past Medical History:  Colon polyps, Allergies, OA, Back pain, DM, Diverticulosis, Fibromyalgia, GERD, Glaucoma, IBS, Iron deficiency anemia, Neuropathy, Hiatal hernia  Past Surgical History:  Her  has a past surgical history that includes Partial hysterectomy (age 20s); Oophorectomy (age 17s); Cholecystectomy; Carpal tunnel release (Bilateral); thumb surger (Bilateral); Knee surgery; Cataract extraction; Colonoscopy (2005); Colonoscopy (2010); Colonoscopy (2014); Esophagogastroduodenoscopy (2010); Esophagogastroduodenoscopy (egd) with propofol (N/A, 10/26/2014); Esophageal dilation (N/A, 10/26/2014); Givens capsule study (N/A, 10/26/2014); Rectal exam under anesthesia (N/A, 10/26/2014); Hemorrhoid banding; Colonoscopy with propofol (N/A, 09/13/2015); and Esophagogastroduodenoscopy (egd) with propofol (N/A, 09/13/2015).  Brief Summary:  Debra Harris is a 69 y.o. female with obstructive sleep apnea.      Subjective:   She was previously seen by Dr. Ashok Cordia.  Had sleep study in 2012 that showed severe sleep apnea.    She has an S9 machine that she uses at her home in [place], and [place] that she uses in [place].  Has full face mask.  No issues with mask fit.  She had concussion about a year ago.  Had memory trouble afterward.  She needed to have dental implant.  This apparently was not manufactured properly.  She is now working with a Optometrist.  She is hoping this will be fixed in November.  She has trouble with her sleep and sinus since she had implant put in.  She has noticed feeling a  globus sensation when she lays flat.  She denies cough, wheeze, or sputum.  She doesn't feel like her breathing limits her activity.    Physical Exam:   Appearance - well kempt   ENMT - no sinus tenderness, no oral exudate, no LAN, Mallampati 4 airway, no stridor  Respiratory - equal breath sounds bilaterally, no wheezing or rales  CV - s1s2 regular rate and rhythm, no murmurs  Ext - no clubbing, no edema  Skin - no rashes  Psych - normal mood and affect   Sleep Tests:   PSG 11/25/10 >> AHI 68.6, SpO2 low 81%  Auto CPAP 11/16/19 to 12/15/19 >> used on 20 of 30 nights with average 7 hrs 32 min.  Average AHI 0.4 with median CPAP 8 and 95 th percentile CPAP 11 cm H2O  Cardiac Tests:   Echo 12/31/17 >> EF 60 to 65%, mild TR, PAS 35 mmHg  Social History:  She  reports that she has never smoked. She has never used smokeless tobacco. She reports previous alcohol use of about 1.0 standard drink of alcohol per week. She reports that she does not use drugs.  Family History:  Her family history includes Depression in her mother; Diabetes in her mother; Glaucoma in her maternal grandmother and mother; Heart disease in her maternal grandfather, maternal grandmother, and mother; Obesity in her mother; Sudden death in her mother; Ulcers in her mother.     Assessment/Plan:   Obstructive sleep apnea. - she is compliant with therapy and reports benefit from CPAP - she uses [employer] as her DME -  she has an S9 device that she uses at her home in [place] - continue auto CPAP range 5 to 12 cm H2O  Obesity. - discussed importance of weight loss  Globus sensation. - if this does not improve after she has dental work in November, then will need further assessment with CXR and PFT  Time Spent Involved in Patient Care on Day of Examination:  32 minutes  Follow up:  Patient Instructions  Follow up in 1 year   Medication List:   Allergies as of 12/17/2019      Reactions    Injectafer [ferric Carboxymaltose]    Has tolerated feraheme without problems      Medication List       Accurate as of December 17, 2019 11:21 AM. If you have any questions, ask your nurse or doctor.        STOP taking these medications   latanoprost 0.005 % ophthalmic solution Commonly known as: XALATAN Stopped by: Chesley Mires, MD   timolol 0.5 % ophthalmic solution Commonly known as: TIMOPTIC Stopped by: Chesley Mires, MD     TAKE these medications   acetaminophen 500 MG tablet Commonly known as: TYLENOL Take 500 mg by mouth every 6 (six) hours as needed.   Benefiber Powd Take by mouth daily.   cetirizine 10 MG tablet Commonly known as: ZYRTEC Take 10 mg by mouth daily.   HYDROcodone-acetaminophen 5-325 MG tablet Commonly known as: NORCO/VICODIN Take 1 tablet by mouth every 6 (six) hours as needed for moderate pain.   Lumigan 0.01 % Soln Generic drug: bimatoprost   Magnesium 400 MG Tabs Take 1 tablet by mouth daily.   MULTIVITAMIN ADULT PO Take 1 tablet by mouth daily.   naproxen sodium 220 MG tablet Commonly known as: ALEVE Take 220 mg by mouth.   NON FORMULARY CPAP at night       Signature:  Chesley Mires, MD [place] Pager - [phone] 12/17/2019, 11:21 AM

## 2019-12-22 ENCOUNTER — Telehealth: Payer: Self-pay | Admitting: Radiology

## 2019-12-22 DIAGNOSIS — M545 Low back pain, unspecified: Secondary | ICD-10-CM

## 2019-12-22 DIAGNOSIS — R29898 Other symptoms and signs involving the musculoskeletal system: Secondary | ICD-10-CM

## 2019-12-22 NOTE — Telephone Encounter (Signed)
Yes can see PT without seeing me

## 2019-12-22 NOTE — Telephone Encounter (Signed)
Patient wants to know if she can go for some physical therapy or if she has to see you first  Her knees have gotten worse since she had the concussion and she feels as if she may be deconditioned.

## 2019-12-22 NOTE — Telephone Encounter (Signed)
Patient wants to know if she can go for some physical therapy or if she has to see you first  Her knees have gotten worse since she had the concussion and she feels as if she may be deconditioned.   (I sent to Dr Raliegh Ip in error, resent to Dr Aline Brochure )

## 2019-12-23 ENCOUNTER — Ambulatory Visit (HOSPITAL_COMMUNITY): Payer: Medicare Other | Admitting: Nurse Practitioner

## 2019-12-24 ENCOUNTER — Encounter (HOSPITAL_COMMUNITY): Payer: Self-pay | Admitting: Physical Therapy

## 2019-12-24 ENCOUNTER — Other Ambulatory Visit: Payer: Self-pay

## 2019-12-24 ENCOUNTER — Ambulatory Visit (HOSPITAL_COMMUNITY): Payer: Medicare Other | Attending: Orthopedic Surgery | Admitting: Physical Therapy

## 2019-12-24 DIAGNOSIS — R293 Abnormal posture: Secondary | ICD-10-CM | POA: Diagnosis not present

## 2019-12-24 DIAGNOSIS — M6281 Muscle weakness (generalized): Secondary | ICD-10-CM

## 2019-12-24 DIAGNOSIS — M545 Low back pain, unspecified: Secondary | ICD-10-CM | POA: Diagnosis not present

## 2019-12-25 NOTE — Therapy (Signed)
[place], [name], [address] Phone: [phone]   Fax:  [phone]  Physical Therapy Evaluation  Patient Details  Name: Debra Harris MRN: [id number] Date of Birth: 1950-09-08 Referring Provider (PT): Arther Abbott MD    Encounter Date: 12/24/2019   PT End of Session - 12/25/19 1735    Visit Number 1    Number of Visits 16    Date for PT Re-Evaluation 02/06/20    Authorization Type Medicare A/ [employer] secondary    PT Start Time 1035    PT Stop Time 1115    PT Time Calculation (min) 40 min    Activity Tolerance Patient tolerated treatment well    Behavior During Therapy [facility] for tasks assessed/performed           Past Medical History:  Diagnosis Date  . Adenomatous colon polyp   . Allergic rhinitis   . Arthritis   . Back pain   . Constipation   . Diabetes mellitus   . Diverticulosis   . External hemorrhoids   . Fibromyalgia   . Gallbladder problem   . GERD (gastroesophageal reflux disease)   . Glaucoma   . Glaucoma   . Hemorrhoids    grade 3   . Hip pain   . IBS (irritable bowel syndrome)   . IDA (iron deficiency anemia)   . Iron deficiency anemia 06/05/2014  . Irregular heartbeat   . Leg edema   . Leg numbness   . Leg pain   . Low tension glaucoma   . Neuropathy, leg   . Ovarian cyst 11/20/00  . Prediabetes   . Sleep apnea    CPAP  . Sliding hiatal hernia     Past Surgical History:  Procedure Laterality Date  . CARPAL TUNNEL RELEASE Bilateral   . CATARACT EXTRACTION     bilateral  . CHOLECYSTECTOMY    . COLONOSCOPY  2005   Small external hemorrhoids, single sigmoid colon diverticulum, small polyp at the hepatic flexure focally adenomatous.  . COLONOSCOPY  2010   Dr. Gala Romney, Versed 4/Demerol 75. Pancolonic diverticula, external hemorrhoids. Colonoscopy every 5 years for history of adenomatous polyps  . COLONOSCOPY  2014   Dr. Olevia Perches: Propofol. mild diverticulosis in  the sigmoid colon, random colon biopsies benign.  . COLONOSCOPY WITH PROPOFOL N/A 09/13/2015   Procedure: COLONOSCOPY WITH PROPOFOL;  Surgeon: Daneil Dolin, MD;  Location: [facility];  Service: Endoscopy;  Laterality: N/A;  1030  . ESOPHAGEAL DILATION N/A 10/26/2014   Procedure: ESOPHAGEAL DILATION [place];  Surgeon: Daneil Dolin, MD;  Location: [facility] OR[facility];  Service: Endoscopy;  Laterality: N/A;  . ESOPHAGOGASTRODUODENOSCOPY  2010   Dr. Gala Romney, distal esophageal erosions, Venia Minks dilator passed. Comment small hiatal hernia, patulous EG junction.  . ESOPHAGOGASTRODUODENOSCOPY (EGD) WITH PROPOFOL N/A 10/26/2014   Dr.Rourk- distal esophageal erosions c/w mild erosive reflux esophagitis. hiatal hernia, o/w normal EGD  . ESOPHAGOGASTRODUODENOSCOPY (EGD) WITH PROPOFOL N/A 09/13/2015   Procedure: ESOPHAGOGASTRODUODENOSCOPY (EGD) WITH PROPOFOL;  Surgeon: Daneil Dolin, MD;  Location: [facility];  Service: Endoscopy;  Laterality: N/A;  . GIVENS CAPSULE STUDY N/A 10/26/2014   couple of mucosal breaks/erosions but essentially unremarkable small bowel study  . HEMORRHOID BANDING    . KNEE SURGERY     right-arthroscopy  . OOPHORECTOMY  age 12s  . PARTIAL HYSTERECTOMY  age 38s  . RECTAL EXAM UNDER ANESTHESIA N/A 10/26/2014   circumferential grade 3 hemoorrhoids easily  reducible  . thumb surger Bilateral    trigger thumb    There were no vitals filed for this visit.    Subjective Assessment - 12/24/19 1054    Subjective Patient presents to physical therapy with complaint of low back pain and generalized weakness. She says she had a concussion last year and was instructed to rest for several months. Patient says this has caused her to become weak in her legs, and feels that she does not have to endurance to do things like she was doing before. Patient also notes that her back has began hurting, possibly related to weakened legs and core muscles. Says she does have shooting pains into both legs  intermittently. Reports history of arthritis, fibromyalgia, and no cartilage in RT knee. Says her RT knee is the primary reason for her balance issues.    Pertinent History arthritis, fibromyalgia, RT knee scope    Limitations Standing;Walking;House hold activities    How long can you stand comfortably? Not sure    How long can you walk comfortably? 15-20 minutes    Diagnostic tests Xrays    Patient Stated Goals Become independent with strengthening my body    Currently in Pain? Yes    Pain Score 5     Pain Location Back    Pain Orientation Posterior;Lower    Pain Descriptors / Indicators Aching;Shooting    Pain Type Chronic pain    Pain Onset More than a month ago    Pain Frequency Constant    Aggravating Factors  "overdoing it"    Pain Relieving Factors "Nothing"    Effect of Pain on Daily Activities Limits             12/24/19 0001  Assessment  Medical Diagnosis LBP, generalized weakness   Referring Provider (PT) Arther Abbott MD   Onset Date/Surgical Date  (chronic )  Prior Therapy No   Precautions  Precautions None  Restrictions  Weight Bearing Restrictions No  Balance Screen  Has the patient fallen in the past 6 months No  Has the patient had a decrease in activity level because of a fear of falling?  No  Is the patient reluctant to leave their home because of a fear of falling?  No  Home [employer] residence  Living Arrangements Spouse/significant other  Prior Function  Level of Independence Independent  Cognition  Overall Cognitive Status Within Functional Limits for tasks assessed  Observation/Other Assessments  Focus on Therapeutic Outcomes (FOTO)  Complete next visit   Posture/Postural Control  Posture/Postural Control Postural limitations  Postural Limitations Increased lumbar lordosis  Posture Comments Increased lordosis/ shear point at L4  ROM / Strength  AROM / PROM / Strength AROM  AROM  AROM Assessment Site Lumbar   Lumbar Flexion 10% limited  Lumbar Extension 50% limited  (increased radicular pains in BLE )  Lumbar - Right Side Bend 10% limited  (pain )  Lumbar - Left Side Bend 25% limited  (pain)  Lumbar - Right Rotation 10% limited  (pain)  Lumbar - Left Rotation 10% limited  (pain)  Palpation  Palpation comment Increased TTP about RT SI joint, glute med  Transfers  Five time sit to stand comments  17 sec with no UEs   Ambulation/Gait  Ambulation/Gait Yes  Ambulation/Gait Assistance 7: Independent  Ambulation Distance (Feet) 452 Feet  Assistive device None  Gait Pattern Decreased stride length;Decreased stance time - right;Antalgic  Ambulation Surface Level;Indoor  Gait Comments 2MWT  PT Education - 12/24/19 1105    Education Details on evaluaiton findings, and POC    Person(s) Educated Patient    Methods Explanation    Comprehension Verbalized understanding            PT Short Term Goals - 12/25/19 1743      PT SHORT TERM GOAL #1   Title Patient will be independent with initial HEP and self-management strategies to improve functional outcomes    Time 3    Period Weeks    Status New    Target Date 01/16/20             PT Long Term Goals - 12/25/19 1743      PT LONG TERM GOAL #1   Title Patient will be independent with final HEP and self-management strategies to improve functional outcomes    Time 6    Period Weeks    Status New    Target Date 02/06/20      PT LONG TERM GOAL #2   Title Patient will report at least 70% overall improvement in subjective complaint to indicate improvement in ability to perform ADLs.    Time 6    Period Weeks    Status New    Target Date 02/06/20      PT LONG TERM GOAL #3   Title Patient will be able to perform stand x 5 in < 12 seconds to demonstrate improvement in functional mobility and reduced risk for falls.    Time 6    Period Weeks    Status New    Target Date 02/06/20      PT LONG TERM GOAL #4   Title Patient  will improve FOTO score to predicted value to indicate improvement in functional outcomes    Time 6    Period Weeks    Status New    Target Date 02/06/20                  Plan - 12/25/19 1740    Clinical Impression Statement Patient is a 69 y.o. female who presents to physical therapy with complaint of BLE weakness and LBP. Patient demonstrates decreased strength, ROM restriction, and gait abnormalities which are likely contributing to symptoms of pain and are negatively impacting patient ability to perform ADLs and functional mobility tasks. Patient will benefit from skilled physical therapy services to address these deficits to reduce pain, improve level of function with ADLs, functional mobility tasks, and reduce risk for falls.    Examination-Activity Limitations Stairs;Squat;Stand;Locomotion Level;Lift    Examination-Participation Restrictions Cleaning;Community Activity;[facility]    Stability/Clinical Decision Making Stable/Uncomplicated    Clinical Decision Making Low    Rehab Potential Good    PT Frequency 3x / week    PT Duration 6 weeks    PT Treatment/Interventions ADLs/Self Care Home Management;Aquatic Therapy;Biofeedback;Cryotherapy;Ultrasound;Parrafin;Fluidtherapy;Therapeutic activities;Patient/family education;Manual lymph drainage;Energy conservation;Splinting;Spinal Manipulations;Visual/perceptual remediation/compensation;Passive range of motion;Dry needling;Neuromuscular re-education;Manual techniques;Functional mobility training;Stair training;Moist Heat;Traction;Iontophoresis 4mg /ml Dexamethasone;Gait training;DME Instruction;Therapeutic exercise;Balance training;Contrast Bath;Electrical Stimulation;Taping;Joint Manipulations;Vasopneumatic Device;Scar mobilization;Orthotic Fit/Training;Compression bandaging    PT Next Visit Plan Complete FOTO. Review goals. Issue HEP. Initiate ther ex program focused on improving functional strengthening of hips and core  musculature, as well as posturing. Manual to address pain and restriction in lumbar/ hip area    PT Home Exercise Plan Issue next visit    Consulted and Agree with Plan of Care Patient           Patient will benefit from skilled therapeutic intervention in  order to improve the following deficits and impairments:  Pain, Improper body mechanics, Increased fascial restricitons, Abnormal gait, Difficulty walking, Decreased balance, Decreased strength, Decreased activity tolerance, Decreased endurance, Decreased mobility, Postural dysfunction  Visit Diagnosis: Low back pain, unspecified back pain laterality, unspecified chronicity, unspecified whether sciatica present  Abnormal posture  Muscle weakness (generalized)     Problem List Patient Active Problem List   Diagnosis Date Noted  . Concussion with no loss of consciousness 03/07/2019  . Retrograde memory loss 03/07/2019  . Vertigo due to brain injury ([place]) 03/07/2019  . Nystagmus on abduction 03/07/2019  . New onset of headaches 03/07/2019  . Controlled type 2 diabetes mellitus with complication, without long-term current use of insulin ([place]) 03/07/2019  . Class 2 severe obesity with serious comorbidity and body mass index (BMI) of 35.0 to 35.9 in adult ([place]) 03/07/2019  . Anemia 02/25/2019  . Hemorrhoids 02/25/2019  . History of hysterectomy 02/25/2019  . Irritable bowel syndrome 02/25/2019  . Closed fracture of nasal bones 07/29/2018  . Obstruction of nasal valve 07/29/2018  . Obesity (BMI 30-39.9) 08/22/2017  . Other fatigue 07/25/2017  . Shortness of breath on exertion 07/25/2017  . Prediabetes 07/25/2017  . Vitamin D deficiency 07/25/2017  . B12 nutritional deficiency 07/25/2017  . Heart murmur 05/10/2016  . Diverticulosis of colon without hemorrhage   . IDA (iron deficiency anemia)   . Rectal bleeding   . History of melena   . History of colonic polyps   . Third degree hemorrhoids   . Reflux esophagitis   .  Hiatal hernia   . Dysphagia, pharyngoesophageal phase   . Adenomatous colon polyp 10/16/2014  . Melena 10/12/2014  . Iron deficiency anemia 06/05/2014  . Seasonal allergies 03/31/2014  . Type 2 diabetes mellitus ([place]) 03/31/2014  . Bilateral dry eyes 01/20/2014  . Blepharochalasis 01/20/2014  . H/O sleep apnea 01/20/2014  . Pseudophakia of both eyes 01/20/2014  . OSA (obstructive sleep apnea) 01/02/2011  . HEMATOCHEZIA 12/28/2008  . ABDOMINAL PAIN, LEFT LOWER QUADRANT 12/28/2008  . COLONIC POLYPS, ADENOMATOUS, HX OF 12/28/2008  . GERD 12/25/2008  . ARTHRITIS 12/25/2008  . LOW BACK PAIN, ACUTE 12/25/2008  . Dysphagia 12/25/2008  . PUD, HX OF 12/25/2008   5:50 PM, 12/25/19 Josue Hector PT DPT  Physical Therapist with [facility]  [phone] [place] [address] [place], [name], [address] Phone: [phone]   Fax:  [phone]  Name: DESTYNI HOPPEL MRN: [id number] Date of Birth: 05-22-1950

## 2019-12-29 ENCOUNTER — Ambulatory Visit (INDEPENDENT_AMBULATORY_CARE_PROVIDER_SITE_OTHER): Payer: Medicare Other | Admitting: Orthopedic Surgery

## 2019-12-29 ENCOUNTER — Ambulatory Visit: Payer: Medicare Other

## 2019-12-29 ENCOUNTER — Telehealth (HOSPITAL_COMMUNITY): Payer: Self-pay | Admitting: Physical Therapy

## 2019-12-29 ENCOUNTER — Ambulatory Visit (HOSPITAL_COMMUNITY): Payer: Medicare Other | Admitting: Physical Therapy

## 2019-12-29 ENCOUNTER — Encounter: Payer: Self-pay | Admitting: Orthopedic Surgery

## 2019-12-29 ENCOUNTER — Other Ambulatory Visit: Payer: Self-pay

## 2019-12-29 DIAGNOSIS — M25562 Pain in left knee: Secondary | ICD-10-CM

## 2019-12-29 DIAGNOSIS — M25571 Pain in right ankle and joints of right foot: Secondary | ICD-10-CM | POA: Diagnosis not present

## 2019-12-29 DIAGNOSIS — W19XXXA Unspecified fall, initial encounter: Secondary | ICD-10-CM | POA: Diagnosis not present

## 2019-12-29 MED ORDER — HYDROCODONE-ACETAMINOPHEN 5-325 MG PO TABS
1.0000 | ORAL_TABLET | Freq: Four times a day (QID) | ORAL | 0 refills | Status: DC | PRN
Start: 2019-12-29 — End: 2020-06-09

## 2019-12-29 NOTE — Patient Instructions (Signed)
Ice several times a day for 48 hours  Use a walker and weight-bear as tolerated in the cam walker

## 2019-12-29 NOTE — Telephone Encounter (Signed)
pt called to cx today's appt since she stated she fell down some stairs

## 2019-12-29 NOTE — Progress Notes (Signed)
Progress Note   Patient ID: Debra Harris, female   DOB: Jul 13, 1950, 69 y.o.   MRN: [id number] C. MANAGEMENT   Cam walker weight-bear as tolerated with a walker follow-up 2 weeks  Meds ordered this encounter  Medications  . HYDROcodone-acetaminophen (NORCO/VICODIN) 5-325 MG tablet    Sig: Take 1 tablet by mouth every 6 (six) hours as needed for moderate pain.    Dispense:  60 tablet    Refill:  0    There is no height or weight on file to calculate BMI.  Chief Complaint  Patient presents with  . Ankle Pain    Right, fell today  . Knee Injury    left, fall today    Encounter Diagnoses  Name Primary?  . Pain in right ankle and joints of right foot Yes  . Acute pain of left knee     69 year old female presents with right ankle pain status post fall she has pain and swelling on the lateral portion of the ankle with swelling and some pain with weightbearing    ROS  Balance issues and chronic knee pain    There were no vitals taken for this visit.  Physical Exam She is awake alert and oriented x3 mood affect normal no vitals taken patient cannot stand on the scale Right ankle large goose egg over the right ankle with tenderness over the distal fibula medial sided knee nontender.  Decreased range of motion painful weightbearing Neurovascular exam is intact  MEDICAL DECISION MAKING Encounter Diagnoses  Name Primary?  . Pain in right ankle and joints of right foot Yes  . Acute pain of left knee     DATA ANALYSED:  IMAGING: Independent interpretation of images: Small avulsion fracture distal tip of the fibula   Arther Abbott, MD 12/29/2019 3:50 PM

## 2019-12-30 ENCOUNTER — Telehealth (HOSPITAL_COMMUNITY): Payer: Self-pay

## 2019-12-30 NOTE — Telephone Encounter (Signed)
pt cancelled appt for 11/05 because she broke her ankle

## 2020-01-02 ENCOUNTER — Ambulatory Visit (HOSPITAL_COMMUNITY): Payer: Medicare Other

## 2020-01-06 ENCOUNTER — Ambulatory Visit (HOSPITAL_COMMUNITY): Payer: Medicare Other | Attending: Orthopedic Surgery | Admitting: Physical Therapy

## 2020-01-06 ENCOUNTER — Other Ambulatory Visit: Payer: Self-pay

## 2020-01-06 DIAGNOSIS — R293 Abnormal posture: Secondary | ICD-10-CM | POA: Diagnosis not present

## 2020-01-06 DIAGNOSIS — M545 Low back pain, unspecified: Secondary | ICD-10-CM | POA: Diagnosis not present

## 2020-01-06 DIAGNOSIS — M6281 Muscle weakness (generalized): Secondary | ICD-10-CM | POA: Diagnosis not present

## 2020-01-06 NOTE — Therapy (Signed)
[name], [name], [address] Phone: [phone]   Fax:  [phone]  Physical Therapy Treatment  Patient Details  Name: Debra Harris MRN: [id number] Date of Birth: 07-Jun-1950 Referring Provider (PT): Arther Abbott MD    Encounter Date: 01/06/2020   PT End of Session - 01/06/20 1717    Visit Number 2    Number of Visits 16    Date for PT Re-Evaluation 02/06/20    Authorization Type Medicare A/ [employer] secondary    PT Start Time 1540    PT Stop Time 1620    PT Time Calculation (min) 40 min    Activity Tolerance Patient tolerated treatment well    Behavior During Therapy [facility] for tasks assessed/performed           Past Medical History:  Diagnosis Date  . Adenomatous colon polyp   . Allergic rhinitis   . Arthritis   . Back pain   . Constipation   . Diabetes mellitus   . Diverticulosis   . External hemorrhoids   . Fibromyalgia   . Gallbladder problem   . GERD (gastroesophageal reflux disease)   . Glaucoma   . Glaucoma   . Hemorrhoids    grade 3   . Hip pain   . IBS (irritable bowel syndrome)   . IDA (iron deficiency anemia)   . Iron deficiency anemia 06/05/2014  . Irregular heartbeat   . Leg edema   . Leg numbness   . Leg pain   . Low tension glaucoma   . Neuropathy, leg   . Ovarian cyst 11/20/00  . Prediabetes   . Sleep apnea    CPAP  . Sliding hiatal hernia     Past Surgical History:  Procedure Laterality Date  . CARPAL TUNNEL RELEASE Bilateral   . CATARACT EXTRACTION     bilateral  . CHOLECYSTECTOMY    . COLONOSCOPY  2005   Small external hemorrhoids, single sigmoid colon diverticulum, small polyp at the hepatic flexure focally adenomatous.  . COLONOSCOPY  2010   Dr. Gala Romney, Versed 4/Demerol 75. Pancolonic diverticula, external hemorrhoids. Colonoscopy every 5 years for history of adenomatous polyps  . COLONOSCOPY  2014   Dr. Olevia Perches: Propofol. mild diverticulosis in the  sigmoid colon, random colon biopsies benign.  . COLONOSCOPY WITH PROPOFOL N/A 09/13/2015   Procedure: COLONOSCOPY WITH PROPOFOL;  Surgeon: Daneil Dolin, MD;  [facility];  Service: Endoscopy;  Laterality: N/A;  1030  . ESOPHAGEAL DILATION N/A 10/26/2014   Procedure: ESOPHAGEAL DILATION [place];  Surgeon: Daneil Dolin, MD;  Location: [facility];  Service: Endoscopy;  Laterality: N/A;  . ESOPHAGOGASTRODUODENOSCOPY  2010   Dr. Gala Romney, distal esophageal erosions, Venia Minks dilator passed. Comment small hiatal hernia, patulous EG junction.  . ESOPHAGOGASTRODUODENOSCOPY (EGD) WITH PROPOFOL N/A 10/26/2014   Dr.Rourk- distal esophageal erosions c/w mild erosive reflux esophagitis. hiatal hernia, o/w normal EGD  . ESOPHAGOGASTRODUODENOSCOPY (EGD) WITH PROPOFOL N/A 09/13/2015   Procedure: ESOPHAGOGASTRODUODENOSCOPY (EGD) WITH PROPOFOL;  Surgeon: Daneil Dolin, MD;  [facility];  Service: Endoscopy;  Laterality: N/A;  . GIVENS CAPSULE STUDY N/A 10/26/2014   couple of mucosal breaks/erosions but essentially unremarkable small bowel study  . HEMORRHOID BANDING    . KNEE SURGERY     right-arthroscopy  . OOPHORECTOMY  age 49s  . PARTIAL HYSTERECTOMY  age 23s  . RECTAL EXAM UNDER ANESTHESIA N/A 10/26/2014   circumferential grade 3 hemoorrhoids easily  reducible  . thumb surger Bilateral    trigger thumb    There were no vitals filed for this visit.   Subjective Assessment - 01/06/20 1543    Subjective pt returns today wearing a CAM boot on her Rt foot due to falling and fracturing her Rt ankle.  Reports her LBP is around a 6/10 today.    Currently in Pain? Yes    Pain Score 6     Pain Location Back    Pain Orientation Lower;Posterior    Pain Descriptors / Indicators Aching              [facility] PT Assessment - 01/06/20 0001      Assessment   Medical Diagnosis LBP, generalized weakness     Referring Provider (PT) Arther Abbott MD       Observation/Other Assessments    Focus on Therapeutic Outcomes (FOTO)  55.9% functional status                         [facility] Adult PT Treatment/Exercise - 01/06/20 0001      Lumbar Exercises: Stretches   Active Hamstring Stretch Right;Left;3 reps;30 seconds    Active Hamstring Stretch Limitations with towel    Piriformis Stretch Right;Left;2 reps;20 seconds    Piriformis Stretch Limitations seated      Lumbar Exercises: Supine   Bridge 10 reps    Straight Leg Raise 10 reps                  PT Education - 01/06/20 1619    Education Details reviewed goals, POC and initiated HEP    Person(s) Educated Patient    Methods Explanation;Verbal cues;Tactile cues;Demonstration    Comprehension Verbalized understanding;Returned demonstration;Verbal cues required;Tactile cues required;Need further instruction            PT Short Term Goals - 01/06/20 1617      PT SHORT TERM GOAL #1   Title Patient will be independent with initial HEP and self-management strategies to improve functional outcomes    Time 3    Period Weeks    Status On-going    Target Date 01/16/20             PT Long Term Goals - 01/06/20 1618      PT LONG TERM GOAL #1   Title Patient will be independent with final HEP and self-management strategies to improve functional outcomes    Time 6    Period Weeks    Status On-going      PT LONG TERM GOAL #2   Title Patient will report at least 70% overall improvement in subjective complaint to indicate improvement in ability to perform ADLs.    Time 6    Period Weeks    Status On-going      PT LONG TERM GOAL #3   Title Patient will be able to perform stand x 5 in < 12 seconds to demonstrate improvement in functional mobility and reduced risk for falls.    Time 6    Period Weeks    Status On-going      PT LONG TERM GOAL #4   Title Patient will improve FOTO score to predicted value to indicate improvement in functional outcomes    Time 6    Period Weeks    Status  On-going                 Plan - 01/06/20 1715  Clinical Impression Statement Reviewed goals, initiated HEP and discussed POC moving forward.  Completed FOTO with 55.9% functional status.  Pt requires cues for proper breathing technique with activity.  Overall good form with therex.  Given written instructions for exercises to be completed at home including piriformis/hamstring stretches, bridge and SLR's.  Pt reported no increase or change in pain at EOS and had no complaints with any exercises completed.    Examination-Activity Limitations Stairs;Squat;Stand;Locomotion Level;Lift    Examination-Participation Restrictions Cleaning;Community Activity;Valla Leaver [facility]    Stability/Clinical Decision Making Stable/Uncomplicated    Rehab Potential Good    PT Frequency 3x / week    PT Duration 6 weeks    PT Treatment/Interventions ADLs/Self Care Home Management;Aquatic Therapy;Biofeedback;Cryotherapy;Ultrasound;Parrafin;Fluidtherapy;Therapeutic activities;Patient/family education;Manual lymph drainage;Energy conservation;Splinting;Spinal Manipulations;Visual/perceptual remediation/compensation;Passive range of motion;Dry needling;Neuromuscular re-education;Manual techniques;Functional mobility training;Stair training;Moist Heat;Traction;Iontophoresis 4mg /ml Dexamethasone;Gait training;DME Instruction;Therapeutic exercise;Balance training;Contrast Bath;Electrical Stimulation;Taping;Joint Manipulations;Vasopneumatic Device;Scar mobilization;Orthotic Fit/Training;Compression bandaging    PT Next Visit Plan Progress ther ex program focused on improving functional strengthening of hips and core musculature, as well as posturing. Manual to address pain and restriction in lumbar/ hip area as needed.    PT Home Exercise Plan 11/9:  bridge, SLR, supine hamstring stretch, seated pirirformis stretch    Consulted and Agree with Plan of Care Patient           Patient will benefit from skilled therapeutic  intervention in order to improve the following deficits and impairments:  Pain, Improper body mechanics, Increased fascial restricitons, Abnormal gait, Difficulty walking, Decreased balance, Decreased strength, Decreased activity tolerance, Decreased endurance, Decreased mobility, Postural dysfunction  Visit Diagnosis: Low back pain, unspecified back pain laterality, unspecified chronicity, unspecified whether sciatica present  Abnormal posture  Muscle weakness (generalized)     Problem List Patient Active Problem List   Diagnosis Date Noted  . Concussion with no loss of consciousness 03/07/2019  . Retrograde memory loss 03/07/2019  . Vertigo due to brain injury ([place]) 03/07/2019  . Nystagmus on abduction 03/07/2019  . New onset of headaches 03/07/2019  . Controlled type 2 diabetes mellitus with complication, without long-term current use of insulin ([place]) 03/07/2019  . Class 2 severe obesity with serious comorbidity and body mass index (BMI) of 35.0 to 35.9 in adult ([place]) 03/07/2019  . Anemia 02/25/2019  . Hemorrhoids 02/25/2019  . History of hysterectomy 02/25/2019  . Irritable bowel syndrome 02/25/2019  . Closed fracture of nasal bones 07/29/2018  . Obstruction of nasal valve 07/29/2018  . Obesity (BMI 30-39.9) 08/22/2017  . Other fatigue 07/25/2017  . Shortness of breath on exertion 07/25/2017  . Prediabetes 07/25/2017  . Vitamin D deficiency 07/25/2017  . B12 nutritional deficiency 07/25/2017  . Heart murmur 05/10/2016  . Diverticulosis of colon without hemorrhage   . IDA (iron deficiency anemia)   . Rectal bleeding   . History of melena   . History of colonic polyps   . Third degree hemorrhoids   . Reflux esophagitis   . Hiatal hernia   . Dysphagia, pharyngoesophageal phase   . Adenomatous colon polyp 10/16/2014  . Melena 10/12/2014  . Iron deficiency anemia 06/05/2014  . Seasonal allergies 03/31/2014  . Type 2 diabetes mellitus ([place]) 03/31/2014  . Bilateral dry  eyes 01/20/2014  . Blepharochalasis 01/20/2014  . H/O sleep apnea 01/20/2014  . Pseudophakia of both eyes 01/20/2014  . OSA (obstructive sleep apnea) 01/02/2011  . HEMATOCHEZIA 12/28/2008  . ABDOMINAL PAIN, LEFT LOWER QUADRANT 12/28/2008  . COLONIC POLYPS, ADENOMATOUS, HX OF 12/28/2008  . GERD 12/25/2008  .  ARTHRITIS 12/25/2008  . LOW BACK PAIN, ACUTE 12/25/2008  . Dysphagia 12/25/2008  . PUD, HX OF 12/25/2008   Teena Irani, PTA/CLT [phone]  Teena Irani 01/06/2020, 5:24 PM  [facility] [address] [place], [name], [address] Phone: [phone]   Fax:  [phone]  Name: Debra Harris MRN: [id number] Date of Birth: [birth year]

## 2020-01-07 ENCOUNTER — Telehealth (HOSPITAL_COMMUNITY): Payer: Self-pay | Admitting: Physical Therapy

## 2020-01-07 NOTE — Telephone Encounter (Signed)
pt cancelled appts for 11/11, 11/15, 11/14, and 11/19 because she is going to be seeing Dr. Aline Brochure to see if she should continue therapy

## 2020-01-08 ENCOUNTER — Encounter (HOSPITAL_COMMUNITY): Payer: Medicare Other | Admitting: Physical Therapy

## 2020-01-12 ENCOUNTER — Ambulatory Visit (INDEPENDENT_AMBULATORY_CARE_PROVIDER_SITE_OTHER): Payer: Medicare Other | Admitting: Orthopedic Surgery

## 2020-01-12 ENCOUNTER — Other Ambulatory Visit: Payer: Self-pay

## 2020-01-12 ENCOUNTER — Encounter (HOSPITAL_COMMUNITY): Payer: Medicare Other | Admitting: Physical Therapy

## 2020-01-12 ENCOUNTER — Telehealth (HOSPITAL_COMMUNITY): Payer: Self-pay | Admitting: Physical Therapy

## 2020-01-12 VITALS — Ht 64.0 in | Wt 206.0 lb

## 2020-01-12 DIAGNOSIS — S93491D Sprain of other ligament of right ankle, subsequent encounter: Secondary | ICD-10-CM

## 2020-01-12 NOTE — Telephone Encounter (Signed)
PATIENT CALLED STATING SHE NEED TO CX THESE -PATIENT WANTS TO BE ON HOLD UNTIL BROKEN ANKLE HEALS.SHE WILL CALL us BACK TO R/S THESE LATER IN DEC OR FIRST OF JAN.

## 2020-01-12 NOTE — Patient Instructions (Addendum)
Ankle Sprain, Phase II Rehab An ankle sprain is an injury to tissue that connects bone to bone (a ligament) in the ankle. Ankle sprains usually cause stiffness, loss of motion, and loss of strength. Ask your health care provider which exercises are safe for you. Do exercises exactly as told by your health care provider and adjust them as directed. It is normal to feel mild stretching, pulling, tightness, or discomfort as you do these exercises. Stop right away if you feel sudden pain or your pain gets worse. Do not begin these exercises until told by your health care provider. Stretching and range-of-motion exercises These exercises warm up your muscles and joints and improve the movement and flexibility of your lower leg and ankle. These exercises also help to relieve pain and stiffness. Standing gastroc stretch This exercise is also called a standing calf (gastroc) stretch. 1. Stand with your hands against a wall. 2. Extend your left / right leg behind you, and bend your front knee slightly. Your heels should be on the floor. 3. Keeping your heels on the floor and your back knee straight, shift your weight toward the wall. You should feel a gentle stretch in the back of your lower leg (calf). 4. Hold this position for ______2____ seconds. Repeat ______10____ times. Complete this exercise _______1___ times a day. Standing soleus stretch This exercise is also called a standing calf (soleus) stretch. 1. Stand with your hands against a wall. 2. Extend your left / right leg behind you, and bend your front knee slightly. Both of your heels should be on the floor. 3. Keeping your heels on the floor, bend your back knee and shift your weight slightly over your back leg. You should feel a gentle stretch deep in your calf. 4. Hold this position for ______2____ seconds. Repeat _____10_____ times. Complete this exercise ______1____ times a day. Strengthening exercises These exercises build strength and  endurance in your ankle and lower leg. Endurance is the ability to use your muscles for a long time, even after they get tired. Ankle dorsiflexion   1. Secure a rubber exercise band or tube to an object, such as a table leg, that will stay still when the band is pulled. Secure the other end around your left / right foot. 2. Sit on the floor facing the object, with your left / right leg extended. The band or tube should be slightly tense when your foot is relaxed. 3. Slowly bring your foot toward you, bringing the top of your foot toward your shin (dorsiflexion), and pulling the band tighter. 4. Hold this position for _____2_____ seconds. 5. Slowly return your foot to the starting position. Repeat _____10_____ times. Complete this exercise ______1____ times a day.   Ankle plantar flexion   1. Sit on the floor with your left / right leg extended. 2. Loop a rubber exercise tube or band around the ball of your left / right foot. The ball of your foot is on the walking surface, right under your toes. ? Hold the ends of the band or tube in your hands. ? The band or tube should be slightly tense when your foot is relaxed. 3. Slowly point your foot and toes downward to tilt the top of your foot away from your shin (plantar flexion). 4. Hold this position for _____2_____ seconds. 5. Slowly return your foot to the starting position. Repeat _____10_____ times. Complete this exercise _______1___ times a day.

## 2020-01-12 NOTE — Progress Notes (Signed)
Chief Complaint  Patient presents with  . Follow-up    Recheck on right ankle, DOI 12-29-19.    Doing well  arom is great  Ankle feels stable   DF/PF is good   Start rom and strengthening exercises   Fu 3 weeks

## 2020-01-13 ENCOUNTER — Other Ambulatory Visit: Payer: Self-pay | Admitting: Orthopedic Surgery

## 2020-01-13 ENCOUNTER — Ambulatory Visit (INDEPENDENT_AMBULATORY_CARE_PROVIDER_SITE_OTHER): Payer: Medicare Other

## 2020-01-13 DIAGNOSIS — S93491D Sprain of other ligament of right ankle, subsequent encounter: Secondary | ICD-10-CM

## 2020-01-14 ENCOUNTER — Ambulatory Visit: Payer: Medicare Other | Admitting: Orthopedic Surgery

## 2020-01-14 ENCOUNTER — Encounter (HOSPITAL_COMMUNITY): Payer: Medicare Other | Admitting: Physical Therapy

## 2020-01-16 ENCOUNTER — Encounter (HOSPITAL_COMMUNITY): Payer: Medicare Other

## 2020-01-19 ENCOUNTER — Encounter (HOSPITAL_COMMUNITY): Payer: Medicare Other | Admitting: Physical Therapy

## 2020-01-28 ENCOUNTER — Inpatient Hospital Stay (HOSPITAL_COMMUNITY): Payer: Medicare Other | Attending: Hematology

## 2020-01-28 ENCOUNTER — Encounter (HOSPITAL_COMMUNITY): Payer: Medicare Other | Admitting: Physical Therapy

## 2020-01-28 ENCOUNTER — Other Ambulatory Visit: Payer: Self-pay

## 2020-01-28 DIAGNOSIS — D508 Other iron deficiency anemias: Secondary | ICD-10-CM

## 2020-01-28 DIAGNOSIS — D509 Iron deficiency anemia, unspecified: Secondary | ICD-10-CM | POA: Diagnosis not present

## 2020-01-28 DIAGNOSIS — Z23 Encounter for immunization: Secondary | ICD-10-CM | POA: Insufficient documentation

## 2020-01-28 LAB — IRON AND TIBC
Iron: 68 ug/dL (ref 28–170)
Saturation Ratios: 17 % (ref 10.4–31.8)
TIBC: 410 ug/dL (ref 250–450)
UIBC: 342 ug/dL

## 2020-01-28 LAB — CBC WITH DIFFERENTIAL/PLATELET
Abs Immature Granulocytes: 0.13 10*3/uL — ABNORMAL HIGH (ref 0.00–0.07)
Basophils Absolute: 0.1 10*3/uL (ref 0.0–0.1)
Basophils Relative: 1 %
Eosinophils Absolute: 0.2 10*3/uL (ref 0.0–0.5)
Eosinophils Relative: 3 %
HCT: 35.5 % — ABNORMAL LOW (ref 36.0–46.0)
Hemoglobin: 11.2 g/dL — ABNORMAL LOW (ref 12.0–15.0)
Immature Granulocytes: 2 %
Lymphocytes Relative: 19 %
Lymphs Abs: 1.3 10*3/uL (ref 0.7–4.0)
MCH: 29.2 pg (ref 26.0–34.0)
MCHC: 31.5 g/dL (ref 30.0–36.0)
MCV: 92.7 fL (ref 80.0–100.0)
Monocytes Absolute: 0.7 10*3/uL (ref 0.1–1.0)
Monocytes Relative: 10 %
Neutro Abs: 4.5 10*3/uL (ref 1.7–7.7)
Neutrophils Relative %: 65 %
Platelets: 205 10*3/uL (ref 150–400)
RBC: 3.83 MIL/uL — ABNORMAL LOW (ref 3.87–5.11)
RDW: 14.5 % (ref 11.5–15.5)
WBC: 6.9 10*3/uL (ref 4.0–10.5)
nRBC: 0 % (ref 0.0–0.2)

## 2020-01-28 LAB — COMPREHENSIVE METABOLIC PANEL
ALT: 17 U/L (ref 0–44)
AST: 15 U/L (ref 15–41)
Albumin: 3.7 g/dL (ref 3.5–5.0)
Alkaline Phosphatase: 51 U/L (ref 38–126)
Anion gap: 5 (ref 5–15)
BUN: 17 mg/dL (ref 8–23)
CO2: 27 mmol/L (ref 22–32)
Calcium: 8.6 mg/dL — ABNORMAL LOW (ref 8.9–10.3)
Chloride: 107 mmol/L (ref 98–111)
Creatinine, Ser: 0.63 mg/dL (ref 0.44–1.00)
GFR, Estimated: >60 mL/min (ref >60)
Glucose, Bld: 132 mg/dL — ABNORMAL HIGH (ref 70–99)
Potassium: 3.8 mmol/L (ref 3.5–5.1)
Sodium: 139 mmol/L (ref 135–145)
Total Bilirubin: 0.5 mg/dL (ref 0.3–1.2)
Total Protein: 6.7 g/dL (ref 6.5–8.1)

## 2020-01-28 LAB — VITAMIN D 25 HYDROXY (VIT D DEFICIENCY, FRACTURES): Vit D, 25-Hydroxy: 25.02 ng/mL — ABNORMAL LOW (ref 30–100)

## 2020-01-28 LAB — VITAMIN B12: Vitamin B-12: 313 pg/mL (ref 180–914)

## 2020-01-28 LAB — LACTATE DEHYDROGENASE: LDH: 125 U/L (ref 98–192)

## 2020-01-28 LAB — FERRITIN: Ferritin: 47 ng/mL (ref 11–307)

## 2020-01-30 ENCOUNTER — Encounter (HOSPITAL_COMMUNITY): Payer: Medicare Other

## 2020-01-30 DIAGNOSIS — S93491A Sprain of other ligament of right ankle, initial encounter: Secondary | ICD-10-CM | POA: Insufficient documentation

## 2020-02-02 ENCOUNTER — Other Ambulatory Visit: Payer: Self-pay

## 2020-02-02 ENCOUNTER — Encounter (HOSPITAL_COMMUNITY): Payer: Medicare Other | Admitting: Physical Therapy

## 2020-02-02 ENCOUNTER — Ambulatory Visit (INDEPENDENT_AMBULATORY_CARE_PROVIDER_SITE_OTHER): Payer: Medicare Other | Admitting: Orthopedic Surgery

## 2020-02-02 ENCOUNTER — Encounter: Payer: Self-pay | Admitting: Orthopedic Surgery

## 2020-02-02 ENCOUNTER — Other Ambulatory Visit: Payer: Self-pay | Admitting: Oncology

## 2020-02-02 VITALS — BP 133/67 | HR 74 | Ht 64.0 in

## 2020-02-02 DIAGNOSIS — S93491D Sprain of other ligament of right ankle, subsequent encounter: Secondary | ICD-10-CM

## 2020-02-02 NOTE — Patient Instructions (Signed)
Continue exercises x1 month

## 2020-02-02 NOTE — Progress Notes (Signed)
Chief Complaint  Patient presents with  . Ankle Injury    right     Follow-up status post ankle sprain on December 29, 2019 patient is she was placed in a cam walker and is now performing home exercises for stretching and strengthening  Current complaints a little soreness  Current exam normal drawer test excellent dorsiflexion mild tenderness anterior talofibular ligament  She was able to walk in the house in a normal shoe yesterday  Encounter Diagnosis  Name Primary?  . Sprain of anterior talofibular ligament of right ankle, subsequent encounter 12/29/2019 Yes   Continue exercises for 1 month follow-up as needed

## 2020-02-04 ENCOUNTER — Encounter (HOSPITAL_COMMUNITY): Payer: Medicare Other | Admitting: Physical Therapy

## 2020-02-05 ENCOUNTER — Other Ambulatory Visit (HOSPITAL_COMMUNITY): Payer: Medicare Other

## 2020-02-06 ENCOUNTER — Other Ambulatory Visit: Payer: Self-pay

## 2020-02-06 ENCOUNTER — Encounter (HOSPITAL_COMMUNITY): Payer: Medicare Other | Admitting: Physical Therapy

## 2020-02-06 ENCOUNTER — Inpatient Hospital Stay (HOSPITAL_COMMUNITY): Payer: Medicare Other

## 2020-02-06 VITALS — BP 115/71 | HR 56 | Temp 97.2°F | Resp 18

## 2020-02-06 DIAGNOSIS — D509 Iron deficiency anemia, unspecified: Secondary | ICD-10-CM | POA: Diagnosis not present

## 2020-02-06 DIAGNOSIS — K625 Hemorrhage of anus and rectum: Secondary | ICD-10-CM

## 2020-02-06 DIAGNOSIS — D500 Iron deficiency anemia secondary to blood loss (chronic): Secondary | ICD-10-CM

## 2020-02-06 DIAGNOSIS — Z23 Encounter for immunization: Secondary | ICD-10-CM | POA: Diagnosis not present

## 2020-02-06 MED ORDER — SODIUM CHLORIDE 0.9 % IV SOLN
510.0000 mg | Freq: Once | INTRAVENOUS | Status: AC
  Administered 2020-02-06: 510 mg via INTRAVENOUS
  Filled 2020-02-06: qty 17

## 2020-02-06 MED ORDER — INFLUENZA VAC A&B SA ADJ QUAD 0.5 ML IM PRSY
0.5000 mL | PREFILLED_SYRINGE | Freq: Once | INTRAMUSCULAR | Status: AC
  Administered 2020-02-06: 0.5 mL via INTRAMUSCULAR
  Filled 2020-02-06: qty 0.5

## 2020-02-06 MED ORDER — SODIUM CHLORIDE 0.9 % IV SOLN: Freq: Once | INTRAVENOUS | Status: AC

## 2020-02-06 MED ORDER — INFLUENZA VAC SPLIT QUAD 0.5 ML IM SUSY: 0.5000 mL | PREFILLED_SYRINGE | Freq: Once | INTRAMUSCULAR | Status: DC

## 2020-02-06 NOTE — Progress Notes (Signed)
Patient presented for Feraheme infusion today.  Vital signs WNL.  No new complaints since last visit.  Received Flu vaccinationin the left deltiod.  Injection site WNL.    Feraheme infusion given today per MD orders.  Tolerated infusion without adverse affects.  Vital signs stable.  No complaints at this time.  Discharge from clinic ambulatory in stable condition.  Alert and oriented X 3.  Follow up with [facility] as scheduled.

## 2020-02-06 NOTE — Patient Instructions (Signed)
[facility] Discharge Instructions for Patients   Today you received the following chemotherapy agents   To help prevent nausea and vomiting after your treatment, we encourage you to take your nausea medication    If you develop nausea and vomiting that is not controlled by your nausea medication, call the clinic.   BELOW ARE SYMPTOMS THAT SHOULD BE REPORTED IMMEDIATELY:  *FEVER GREATER THAN 100.5 F  *CHILLS WITH OR WITHOUT FEVER  NAUSEA AND VOMITING THAT IS NOT CONTROLLED WITH YOUR NAUSEA MEDICATION  *UNUSUAL SHORTNESS OF BREATH  *UNUSUAL BRUISING OR BLEEDING  TENDERNESS IN MOUTH AND THROAT WITH OR WITHOUT PRESENCE OF ULCERS  *URINARY PROBLEMS  *BOWEL PROBLEMS  UNUSUAL RASH Items with * indicate a potential emergency and should be followed up as soon as possible.  Feel free to call the clinic should you have any questions or concerns. The clinic phone number is [phone].  Please show the CHEMO ALERT CARD at check-in to the Emergency Department and triage nurse.   

## 2020-02-09 ENCOUNTER — Other Ambulatory Visit: Payer: Self-pay | Admitting: Radiology

## 2020-02-09 DIAGNOSIS — M9903 Segmental and somatic dysfunction of lumbar region: Secondary | ICD-10-CM | POA: Diagnosis not present

## 2020-02-09 DIAGNOSIS — M5136 Other intervertebral disc degeneration, lumbar region: Secondary | ICD-10-CM | POA: Diagnosis not present

## 2020-02-09 DIAGNOSIS — M9901 Segmental and somatic dysfunction of cervical region: Secondary | ICD-10-CM | POA: Diagnosis not present

## 2020-02-09 DIAGNOSIS — M50322 Other cervical disc degeneration at C5-C6 level: Secondary | ICD-10-CM | POA: Diagnosis not present

## 2020-02-09 MED ORDER — PREDNISONE 10 MG (48) PO TBPK: ORAL_TABLET | ORAL | 0 refills | Status: DC

## 2020-02-09 MED ORDER — CYCLOBENZAPRINE HCL 10 MG PO TABS: 10.0000 mg | ORAL_TABLET | Freq: Three times a day (TID) | ORAL | 1 refills | Status: DC | PRN

## 2020-02-09 NOTE — Telephone Encounter (Signed)
Patient having sciatica left sided  ? From walking in boot  Can you call her?  Also she has requested a steroid taper and some Flexeril

## 2020-02-10 DIAGNOSIS — M9903 Segmental and somatic dysfunction of lumbar region: Secondary | ICD-10-CM | POA: Diagnosis not present

## 2020-02-10 DIAGNOSIS — M5136 Other intervertebral disc degeneration, lumbar region: Secondary | ICD-10-CM | POA: Diagnosis not present

## 2020-02-10 DIAGNOSIS — M50322 Other cervical disc degeneration at C5-C6 level: Secondary | ICD-10-CM | POA: Diagnosis not present

## 2020-02-10 DIAGNOSIS — M9901 Segmental and somatic dysfunction of cervical region: Secondary | ICD-10-CM | POA: Diagnosis not present

## 2020-02-11 DIAGNOSIS — M9903 Segmental and somatic dysfunction of lumbar region: Secondary | ICD-10-CM | POA: Diagnosis not present

## 2020-02-11 DIAGNOSIS — M9901 Segmental and somatic dysfunction of cervical region: Secondary | ICD-10-CM | POA: Diagnosis not present

## 2020-02-11 DIAGNOSIS — M50322 Other cervical disc degeneration at C5-C6 level: Secondary | ICD-10-CM | POA: Diagnosis not present

## 2020-02-11 DIAGNOSIS — M5136 Other intervertebral disc degeneration, lumbar region: Secondary | ICD-10-CM | POA: Diagnosis not present

## 2020-02-12 ENCOUNTER — Ambulatory Visit (HOSPITAL_COMMUNITY): Payer: Medicare Other | Admitting: Hematology

## 2020-02-12 ENCOUNTER — Other Ambulatory Visit (HOSPITAL_COMMUNITY): Payer: Medicare Other

## 2020-02-13 ENCOUNTER — Ambulatory Visit (HOSPITAL_COMMUNITY): Payer: Medicare Other

## 2020-02-16 DIAGNOSIS — M50322 Other cervical disc degeneration at C5-C6 level: Secondary | ICD-10-CM | POA: Diagnosis not present

## 2020-02-16 DIAGNOSIS — M5136 Other intervertebral disc degeneration, lumbar region: Secondary | ICD-10-CM | POA: Diagnosis not present

## 2020-02-16 DIAGNOSIS — M9901 Segmental and somatic dysfunction of cervical region: Secondary | ICD-10-CM | POA: Diagnosis not present

## 2020-02-16 DIAGNOSIS — M9903 Segmental and somatic dysfunction of lumbar region: Secondary | ICD-10-CM | POA: Diagnosis not present

## 2020-02-19 ENCOUNTER — Ambulatory Visit (HOSPITAL_COMMUNITY): Payer: Medicare Other | Admitting: Hematology and Oncology

## 2020-03-05 ENCOUNTER — Other Ambulatory Visit (HOSPITAL_COMMUNITY): Payer: Self-pay | Admitting: Obstetrics & Gynecology

## 2020-03-05 DIAGNOSIS — Z1231 Encounter for screening mammogram for malignant neoplasm of breast: Secondary | ICD-10-CM

## 2020-03-05 DIAGNOSIS — Z01419 Encounter for gynecological examination (general) (routine) without abnormal findings: Secondary | ICD-10-CM | POA: Diagnosis not present

## 2020-03-05 DIAGNOSIS — Z6836 Body mass index (BMI) 36.0-36.9, adult: Secondary | ICD-10-CM | POA: Diagnosis not present

## 2020-03-05 DIAGNOSIS — R822 Biliuria: Secondary | ICD-10-CM | POA: Diagnosis not present

## 2020-03-05 DIAGNOSIS — Z124 Encounter for screening for malignant neoplasm of cervix: Secondary | ICD-10-CM | POA: Diagnosis not present

## 2020-03-10 ENCOUNTER — Ambulatory Visit (HOSPITAL_COMMUNITY)
Admission: RE | Admit: 2020-03-10 | Discharge: 2020-03-10 | Disposition: A | Discharge status: Home / Self Care | Payer: Medicare Other | Source: Ambulatory Visit | Attending: Obstetrics & Gynecology | Admitting: Obstetrics & Gynecology

## 2020-03-10 ENCOUNTER — Other Ambulatory Visit: Payer: Self-pay

## 2020-03-10 DIAGNOSIS — Z1231 Encounter for screening mammogram for malignant neoplasm of breast: Secondary | ICD-10-CM | POA: Insufficient documentation

## 2020-03-19 DIAGNOSIS — D3131 Benign neoplasm of right choroid: Secondary | ICD-10-CM | POA: Diagnosis not present

## 2020-03-19 DIAGNOSIS — Z961 Presence of intraocular lens: Secondary | ICD-10-CM | POA: Diagnosis not present

## 2020-03-19 DIAGNOSIS — E119 Type 2 diabetes mellitus without complications: Secondary | ICD-10-CM | POA: Diagnosis not present

## 2020-03-19 DIAGNOSIS — H04123 Dry eye syndrome of bilateral lacrimal glands: Secondary | ICD-10-CM | POA: Diagnosis not present

## 2020-03-19 DIAGNOSIS — H43813 Vitreous degeneration, bilateral: Secondary | ICD-10-CM | POA: Diagnosis not present

## 2020-03-19 DIAGNOSIS — H401132 Primary open-angle glaucoma, bilateral, moderate stage: Secondary | ICD-10-CM | POA: Diagnosis not present

## 2020-03-22 ENCOUNTER — Other Ambulatory Visit (HOSPITAL_COMMUNITY): Payer: Self-pay | Admitting: Surgery

## 2020-03-22 DIAGNOSIS — E1122 Type 2 diabetes mellitus with diabetic chronic kidney disease: Secondary | ICD-10-CM | POA: Diagnosis not present

## 2020-03-22 DIAGNOSIS — G720 Drug-induced myopathy: Secondary | ICD-10-CM | POA: Diagnosis not present

## 2020-03-22 DIAGNOSIS — Z6836 Body mass index (BMI) 36.0-36.9, adult: Secondary | ICD-10-CM | POA: Diagnosis not present

## 2020-03-22 DIAGNOSIS — G4733 Obstructive sleep apnea (adult) (pediatric): Secondary | ICD-10-CM | POA: Diagnosis not present

## 2020-03-22 DIAGNOSIS — K625 Hemorrhage of anus and rectum: Secondary | ICD-10-CM

## 2020-03-22 DIAGNOSIS — D500 Iron deficiency anemia secondary to blood loss (chronic): Secondary | ICD-10-CM

## 2020-03-22 DIAGNOSIS — R7309 Other abnormal glucose: Secondary | ICD-10-CM | POA: Diagnosis not present

## 2020-03-23 ENCOUNTER — Other Ambulatory Visit: Payer: Self-pay

## 2020-03-23 ENCOUNTER — Inpatient Hospital Stay (HOSPITAL_COMMUNITY): Payer: Medicare Other | Attending: Hematology

## 2020-03-23 DIAGNOSIS — Z86010 Personal history of colonic polyps: Secondary | ICD-10-CM | POA: Insufficient documentation

## 2020-03-23 DIAGNOSIS — E119 Type 2 diabetes mellitus without complications: Secondary | ICD-10-CM | POA: Insufficient documentation

## 2020-03-23 DIAGNOSIS — K625 Hemorrhage of anus and rectum: Secondary | ICD-10-CM

## 2020-03-23 DIAGNOSIS — R5383 Other fatigue: Secondary | ICD-10-CM | POA: Diagnosis not present

## 2020-03-23 DIAGNOSIS — D509 Iron deficiency anemia, unspecified: Secondary | ICD-10-CM | POA: Diagnosis not present

## 2020-03-23 DIAGNOSIS — K922 Gastrointestinal hemorrhage, unspecified: Secondary | ICD-10-CM | POA: Insufficient documentation

## 2020-03-23 DIAGNOSIS — K909 Intestinal malabsorption, unspecified: Secondary | ICD-10-CM | POA: Insufficient documentation

## 2020-03-23 DIAGNOSIS — E559 Vitamin D deficiency, unspecified: Secondary | ICD-10-CM | POA: Insufficient documentation

## 2020-03-23 DIAGNOSIS — D500 Iron deficiency anemia secondary to blood loss (chronic): Secondary | ICD-10-CM

## 2020-03-23 LAB — CBC WITH DIFFERENTIAL/PLATELET
Abs Immature Granulocytes: 0.02 10*3/uL (ref 0.00–0.07)
Basophils Absolute: 0 10*3/uL (ref 0.0–0.1)
Basophils Relative: 1 %
Eosinophils Absolute: 0.1 10*3/uL (ref 0.0–0.5)
Eosinophils Relative: 2 %
HCT: 39.4 % (ref 36.0–46.0)
Hemoglobin: 12.5 g/dL (ref 12.0–15.0)
Immature Granulocytes: 0 %
Lymphocytes Relative: 27 %
Lymphs Abs: 1.2 10*3/uL (ref 0.7–4.0)
MCH: 29.3 pg (ref 26.0–34.0)
MCHC: 31.7 g/dL (ref 30.0–36.0)
MCV: 92.3 fL (ref 80.0–100.0)
Monocytes Absolute: 0.3 10*3/uL (ref 0.1–1.0)
Monocytes Relative: 8 %
Neutro Abs: 2.8 10*3/uL (ref 1.7–7.7)
Neutrophils Relative %: 62 %
Platelets: 241 10*3/uL (ref 150–400)
RBC: 4.27 MIL/uL (ref 3.87–5.11)
RDW: 13.2 % (ref 11.5–15.5)
WBC: 4.5 10*3/uL (ref 4.0–10.5)
nRBC: 0 % (ref 0.0–0.2)

## 2020-03-23 LAB — COMPREHENSIVE METABOLIC PANEL
ALT: 23 U/L (ref 0–44)
AST: 20 U/L (ref 15–41)
Albumin: 4.3 g/dL (ref 3.5–5.0)
Alkaline Phosphatase: 56 U/L (ref 38–126)
Anion gap: 7 (ref 5–15)
BUN: 12 mg/dL (ref 8–23)
CO2: 28 mmol/L (ref 22–32)
Calcium: 9.7 mg/dL (ref 8.9–10.3)
Chloride: 103 mmol/L (ref 98–111)
Creatinine, Ser: 0.7 mg/dL (ref 0.44–1.00)
GFR, Estimated: >60 mL/min (ref >60)
Glucose, Bld: 108 mg/dL — ABNORMAL HIGH (ref 70–99)
Potassium: 3.8 mmol/L (ref 3.5–5.1)
Sodium: 138 mmol/L (ref 135–145)
Total Bilirubin: 0.8 mg/dL (ref 0.3–1.2)
Total Protein: 7.6 g/dL (ref 6.5–8.1)

## 2020-03-23 LAB — LACTATE DEHYDROGENASE: LDH: 156 U/L (ref 98–192)

## 2020-03-23 LAB — IRON AND TIBC
Iron: 73 ug/dL (ref 28–170)
Saturation Ratios: 16 % (ref 10.4–31.8)
TIBC: 457 ug/dL — ABNORMAL HIGH (ref 250–450)
UIBC: 384 ug/dL

## 2020-03-23 LAB — FERRITIN: Ferritin: 44 ng/mL (ref 11–307)

## 2020-03-23 LAB — VITAMIN B12: Vitamin B-12: 412 pg/mL (ref 180–914)

## 2020-03-23 LAB — VITAMIN D 25 HYDROXY (VIT D DEFICIENCY, FRACTURES): Vit D, 25-Hydroxy: 19.96 ng/mL — ABNORMAL LOW (ref 30–100)

## 2020-03-29 ENCOUNTER — Other Ambulatory Visit: Payer: Self-pay

## 2020-03-29 ENCOUNTER — Inpatient Hospital Stay (HOSPITAL_BASED_OUTPATIENT_CLINIC_OR_DEPARTMENT_OTHER): Payer: Medicare Other | Admitting: Hematology

## 2020-03-29 VITALS — BP 129/62 | HR 71 | Temp 96.9°F | Resp 20 | Wt 214.0 lb

## 2020-03-29 DIAGNOSIS — E559 Vitamin D deficiency, unspecified: Secondary | ICD-10-CM | POA: Diagnosis not present

## 2020-03-29 DIAGNOSIS — D509 Iron deficiency anemia, unspecified: Secondary | ICD-10-CM | POA: Diagnosis not present

## 2020-03-29 DIAGNOSIS — R5383 Other fatigue: Secondary | ICD-10-CM | POA: Diagnosis not present

## 2020-03-29 DIAGNOSIS — E119 Type 2 diabetes mellitus without complications: Secondary | ICD-10-CM | POA: Diagnosis not present

## 2020-03-29 DIAGNOSIS — D500 Iron deficiency anemia secondary to blood loss (chronic): Secondary | ICD-10-CM

## 2020-03-29 DIAGNOSIS — K909 Intestinal malabsorption, unspecified: Secondary | ICD-10-CM | POA: Diagnosis not present

## 2020-03-29 DIAGNOSIS — K922 Gastrointestinal hemorrhage, unspecified: Secondary | ICD-10-CM | POA: Diagnosis not present

## 2020-03-29 NOTE — Progress Notes (Signed)
Debra Harris, [place] [id number]   CLINIC:  Medical Oncology/Hematology  PCP:  Asencion Noble, MD [address] / [name] [address]  [phone]  REASON FOR VISIT:  Follow-up for IDA  PRIOR THERAPY: None  CURRENT THERAPY: Intermittent Feraheme last on 02/06/2020  INTERVAL HISTORY:  Debra Harris, a 70 y.o. female, returns for routine follow-up for her IDA. Debra Harris was last seen by Francene Finders, NP, on 10/29/2019.  Today Debra Harris reports feeling well. Her energy levels are still decreased and reports having to wear a pad constantly for occasional bright red blood per rectum which is not daily, even though Debra Harris had several hemorrhoid banding. Debra Harris also complains of having problems with her sleep for the past 2 years due to issues with her teeth and feels tired from that. Debra Harris received Feraheme in December and tolerated it well; Debra Harris notes that Debra Harris got only 1 unit and her energy levels did not improve as much. Debra Harris denies having pica. Debra Harris has recently started taking vitamin D due to low levels.   REVIEW OF SYSTEMS:  Review of Systems  Constitutional: Positive for fatigue (50%). Negative for appetite change.  Gastrointestinal: Positive for blood in stool (bright red blood occasional).  Psychiatric/Behavioral: Positive for sleep disturbance (due to teeth pain).  All other systems reviewed and are negative.   PAST MEDICAL/SURGICAL HISTORY:  Past Medical History:  Diagnosis Date  . Adenomatous colon polyp   . Allergic rhinitis   . Arthritis   . Back pain   . Constipation   . Diabetes mellitus   . Diverticulosis   . External hemorrhoids   . Fibromyalgia   . Gallbladder problem   . GERD (gastroesophageal reflux disease)   . Glaucoma   . Glaucoma   . Hemorrhoids    grade 3   . Hip pain   . IBS (irritable bowel syndrome)   . IDA (iron deficiency anemia)   . Iron deficiency anemia 06/05/2014  . Irregular heartbeat   . Leg edema   .  Leg numbness   . Leg pain   . Low tension glaucoma   . Neuropathy, leg   . Ovarian cyst 11/20/00  . Prediabetes   . Sleep apnea    CPAP  . Sliding hiatal hernia    Past Surgical History:  Procedure Laterality Date  . CARPAL TUNNEL RELEASE Bilateral   . CATARACT EXTRACTION     bilateral  . CHOLECYSTECTOMY    . COLONOSCOPY  2005   Small external hemorrhoids, single sigmoid colon diverticulum, small polyp at the hepatic flexure focally adenomatous.  . COLONOSCOPY  2010   Dr. Gala Romney, Versed 4/Demerol 75. Pancolonic diverticula, external hemorrhoids. Colonoscopy every 5 years for history of adenomatous polyps  . COLONOSCOPY  2014   Dr. Olevia Perches: Propofol. mild diverticulosis in the sigmoid colon, random colon biopsies benign.  . COLONOSCOPY WITH PROPOFOL N/A 09/13/2015   Procedure: COLONOSCOPY WITH PROPOFOL;  Surgeon: Daneil Dolin, MD;  Location: [facility];  Service: Endoscopy;  Laterality: N/A;  1030  . ESOPHAGEAL DILATION N/A 10/26/2014   Procedure: ESOPHAGEAL DILATION [place];  Surgeon: Daneil Dolin, MD;  Location: [facility];  Service: Endoscopy;  Laterality: N/A;  . ESOPHAGOGASTRODUODENOSCOPY  2010   Dr. Gala Romney, distal esophageal erosions, Venia Minks dilator passed. Comment small hiatal hernia, patulous EG junction.  . ESOPHAGOGASTRODUODENOSCOPY (EGD) WITH PROPOFOL N/A 10/26/2014   Dr.Rourk- distal esophageal erosions c/w mild erosive reflux esophagitis. hiatal hernia, o/w  normal EGD  . ESOPHAGOGASTRODUODENOSCOPY (EGD) WITH PROPOFOL N/A 09/13/2015   Procedure: ESOPHAGOGASTRODUODENOSCOPY (EGD) WITH PROPOFOL;  Surgeon: Daneil Dolin, MD;  Location: [facility];  Service: Endoscopy;  Laterality: N/A;  . GIVENS CAPSULE STUDY N/A 10/26/2014   couple of mucosal breaks/erosions but essentially unremarkable small bowel study  . HEMORRHOID BANDING    . KNEE SURGERY     right-arthroscopy  . OOPHORECTOMY  age 35s  . PARTIAL HYSTERECTOMY  age 5s  . RECTAL EXAM UNDER ANESTHESIA N/A 10/26/2014    circumferential grade 3 hemoorrhoids easily reducible  . thumb surger Bilateral    trigger thumb    SOCIAL HISTORY:  Social History   Socioeconomic History  . Marital status: Married    Spouse name: J. Orland Dec, MD  . Number of children: 3  . Years of education: Not on file  . Highest education level: Not on file  Occupational History  . Occupation: AUDIOLOGIST, retired  Tobacco Use  . Smoking status: Never Smoker  . Smokeless tobacco: Never Used  . Tobacco comment: Pt reports tried once in 7th grade//ms  Vaping Use  . Vaping Use: Never used  Substance and Sexual Activity  . Alcohol use: Not Currently    Alcohol/week: 1.0 standard drink    Types: 1 Glasses of wine per week    Comment: rare  . Drug use: No  . Sexual activity: Never    Birth control/protection: None  Other Topics Concern  . Not on file  Social History Narrative   3 children & married to orthopedist. 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   Social Determinants of Health   Financial Resource Strain: Not on file  Food Insecurity: Not on file  Transportation Needs: Not on file  Physical Activity: Inactive  . Days of Exercise per Week: 0 days  . Minutes of Exercise per Session: 0 min  Stress: Not on file  Social Connections: Not on file  Intimate Partner Violence: Not At Risk  . Fear of Current or Ex-Partner: No  . Emotionally Abused: No  . Physically Abused: No  . Sexually Abused: No    FAMILY HISTORY:  Family History  Problem Relation Age of Onset  . Ulcers Mother   . Glaucoma Mother   . Heart disease Mother   . Diabetes Mother   . Depression Mother   . Sudden death Mother   . Obesity Mother   . Glaucoma Maternal Grandmother   . Heart disease Maternal Grandmother   . Heart disease Maternal Grandfather   . Colon cancer Neg Hx     CURRENT MEDICATIONS:  Current Outpatient Medications  Medication Sig Dispense Refill  . acetaminophen (TYLENOL) 500 MG tablet Take 500 mg by  mouth every 6 (six) hours as needed.    . cetirizine (ZYRTEC) 10 MG tablet Take 10 mg by mouth daily.     . cyclobenzaprine (FLEXERIL) 10 MG tablet Take 1 tablet (10 mg total) by mouth 3 (three) times daily as needed for muscle spasms. 42 tablet 1  . HYDROcodone-acetaminophen (NORCO/VICODIN) 5-325 MG tablet Take 1 tablet by mouth every 6 (six) hours as needed for moderate pain. 60 tablet 0  . LUMIGAN 0.01 % SOLN     . Magnesium 400 MG TABS Take 1 tablet by mouth daily.    . Multiple Vitamins-Minerals (MULTIVITAMIN ADULT PO) Take 1 tablet by mouth daily.    . naproxen sodium (ALEVE) 220 MG tablet Take 220 mg by mouth.    Marland Kitchen  NON FORMULARY CPAP at night     . predniSONE (STERAPRED UNI-PAK 48 TAB) 10 MG (48) TBPK tablet Take as directed on pack / 12 day taper 48 tablet 0  . timolol (BETIMOL) 0.5 % ophthalmic solution Betimol 0.5 % eye drops    . Wheat Dextrin (BENEFIBER) POWD Take by mouth daily.     No current facility-administered medications for this visit.    ALLERGIES:  Allergies  Allergen Reactions  . Injectafer [Ferric Carboxymaltose]     Has tolerated feraheme without problems    PHYSICAL EXAM:  Performance status (ECOG): 1 - Symptomatic but completely ambulatory  Vitals:   03/29/20 1433  BP: 129/62  Pulse: 71  Resp: 20  Temp: (!) 96.9 F (36.1 C)  SpO2: 100%   Wt Readings from Last 3 Encounters:  03/29/20 214 lb (97.1 kg)  01/12/20 206 lb (93.4 kg)  12/17/19 206 lb (93.4 kg)   Physical Exam Vitals reviewed.  Constitutional:      Appearance: Normal appearance. Debra Harris is obese.  Cardiovascular:     Rate and Rhythm: Normal rate and regular rhythm.     Pulses: Normal pulses.     Heart sounds: Normal heart sounds.  Pulmonary:     Effort: Pulmonary effort is normal.     Breath sounds: Normal breath sounds.  Abdominal:     Palpations: Abdomen is soft. There is no hepatomegaly, splenomegaly or mass.     Tenderness: There is no abdominal tenderness.     Hernia: No hernia  is present.  Musculoskeletal:     Right lower leg: No edema.     Left lower leg: No edema.  Neurological:     General: No focal deficit present.     Mental Status: Debra Harris is alert and oriented to person, place, and time.  Psychiatric:        Mood and Affect: Mood normal.        Behavior: Behavior normal.     LABORATORY DATA:  I have reviewed the labs as listed.  CBC Latest Ref Rng & Units 03/23/2020 01/28/2020 10/09/2019  WBC 4.0 - 10.5 K/uL 4.5 6.9 6.7  Hemoglobin 12.0 - 15.0 g/dL 12.5 11.2(L) 13.1  Hematocrit 36.0 - 46.0 % 39.4 35.5(L) 41.0  Platelets 150 - 400 K/uL 241 205 219   CMP Latest Ref Rng & Units 03/23/2020 01/28/2020 10/09/2019  Glucose 70 - 99 mg/dL 108(H) 132(H) 114(H)  BUN 8 - 23 mg/dL 12 17 17   Creatinine 0.44 - 1.00 mg/dL 0.70 0.63 0.76  Sodium 135 - 145 mmol/L 138 139 138  Potassium 3.5 - 5.1 mmol/L 3.8 3.8 3.8  Chloride 98 - 111 mmol/L 103 107 101  CO2 22 - 32 mmol/L 28 27 27   Calcium 8.9 - 10.3 mg/dL 9.7 8.6(L) 9.4  Total Protein 6.5 - 8.1 g/dL 7.6 6.7 7.7  Total Bilirubin 0.3 - 1.2 mg/dL 0.8 0.5 0.8  Alkaline Phos 38 - 126 U/L 56 51 61  AST 15 - 41 U/L 20 15 23   ALT 0 - 44 U/L 23 17 25       Component Value Date/Time   RBC 4.27 03/23/2020 1308   MCV 92.3 03/23/2020 1308   MCH 29.3 03/23/2020 1308   MCHC 31.7 03/23/2020 1308   RDW 13.2 03/23/2020 1308   LYMPHSABS 1.2 03/23/2020 1308   MONOABS 0.3 03/23/2020 1308   EOSABS 0.1 03/23/2020 1308   BASOSABS 0.0 03/23/2020 1308   Lab Results  Component Value Date   LDH 156 03/23/2020  LDH 125 01/28/2020   LDH 161 10/09/2019   Lab Results  Component Value Date   VD25OH 19.96 (L) 03/23/2020   VD25OH 25.02 (L) 01/28/2020   VD25OH 37.7 01/08/2018   Lab Results  Component Value Date   TIBC 457 (H) 03/23/2020   TIBC 410 01/28/2020   TIBC 483 (H) 10/09/2019   FERRITIN 44 03/23/2020   FERRITIN 47 01/28/2020   FERRITIN 20 10/09/2019   IRONPCTSAT 16 03/23/2020   IRONPCTSAT 17 01/28/2020   IRONPCTSAT 13  10/09/2019    DIAGNOSTIC IMAGING:  I have independently reviewed the scans and discussed with the patient. MM 3D SCREEN BREAST BILATERAL  Result Date: 03/10/2020 CLINICAL DATA:  Screening. EXAM: DIGITAL SCREENING BILATERAL MAMMOGRAM WITH TOMO AND CAD COMPARISON:  Previous exam(s). ACR Breast Density Category b: There are scattered areas of fibroglandular density. FINDINGS: There are no findings suspicious for malignancy. Images were processed with CAD. IMPRESSION: No mammographic evidence of malignancy. A result letter of this screening mammogram will be mailed directly to the patient. RECOMMENDATION: Screening mammogram in one year. (Code:SM-B-01Y) BI-RADS CATEGORY  1: Negative. Electronically Signed   By: Lajean Manes M.D.   On: 03/10/2020 08:34     ASSESSMENT:  1.  Iron deficiency anemia: -Iron deficiency state from combination of malabsorption and chronic GI loss. -Colonoscopy on 09/13/2015 showing diverticuli in the sigmoid colon, descending colon and transverse colon, internal and external hemorrhoids banded.   PLAN:  1.  Iron deficiency anemia: -Debra Harris continues to have on and off hemorrhoidal bleeding. -Debra Harris is also having some dental work done.  Debra Harris has some tiredness with energy levels of 50%. -Reviewed labs from 03/23/2020.  Ferritin is 44 with percent saturation of 16.  Creatinine 0.7.  Hemoglobin is 12.5. -Recommend Feraheme weekly x2. -RTC 6 months with repeat labs including ferritin, iron panel, and CBC.  2.  Vitamin D deficiency: -Vitamin D level was 19.96. -Recommend taking vitamin D 2000 to 5000 units daily.   Orders placed this encounter:  Orders Placed This Encounter  Procedures  . CBC with Differential/Platelet  . CBC with Differential/Platelet  . Comprehensive metabolic panel  . Lactate dehydrogenase  . Ferritin  . Iron and TIBC  . Vitamin B12  . VITAMIN D 25 Hydroxy (Vit-D Deficiency, Fractures)     Derek Jack, MD [place] [phone]   I, Milinda Antis, am acting as a scribe for Dr. Sanda Linger.  I, Derek Jack MD, have reviewed the above documentation for accuracy and completeness, and I agree with the above.

## 2020-03-29 NOTE — Patient Instructions (Signed)
[place] at [facility] Discharge Instructions  You were seen today by Dr. Delton Coombes. He went over your recent results. You will be scheduled to recieve an iron infusion in march and 1 in April. Purchase vitamin D over the counter and take 2,000-5,000 units daily. Dr. Delton Coombes will see you back in 6 months for labs and follow up.   Thank you for choosing [place] at [facility] to provide your oncology and hematology care.  To afford each patient quality time with our provider, please arrive at least 15 minutes before your scheduled appointment time.   If you have a lab appointment with the [facility] please come in thru the Main Entrance and check in at the main information desk  You need to re-schedule your appointment should you arrive 10 or more minutes late.  We strive to give you quality time with our providers, and arriving late affects you and other patients whose appointments are after yours.  Also, if you no show three or more times for appointments you may be dismissed from the clinic at the providers discretion.     Again, thank you for choosing [facility].  Our hope is that these requests will decrease the amount of time that you wait before being seen by our physicians.       _____________________________________________________________  Should you have questions after your visit to [facility], please contact our office at [phone] between the hours of 8:00 a.m. and 4:30 p.m.  Voicemails left after 4:00 p.m. will not be returned until the following business day.  For prescription refill requests, have your pharmacy contact our office and allow 72 hours.    Cancer Center Support Programs:   > Cancer Support Group  2nd Tuesday of the month 1pm-2pm, Journey Room

## 2020-04-02 ENCOUNTER — Other Ambulatory Visit: Payer: Medicare Other

## 2020-04-02 DIAGNOSIS — Z20822 Contact with and (suspected) exposure to covid-19: Secondary | ICD-10-CM | POA: Diagnosis not present

## 2020-04-03 LAB — SARS-COV-2, NAA 2 DAY TAT

## 2020-04-03 LAB — NOVEL CORONAVIRUS, NAA: SARS-CoV-2, NAA: NOT DETECTED

## 2020-05-11 ENCOUNTER — Other Ambulatory Visit: Payer: Self-pay

## 2020-05-11 ENCOUNTER — Inpatient Hospital Stay (HOSPITAL_COMMUNITY): Payer: Medicare Other | Attending: Hematology

## 2020-05-11 VITALS — BP 140/59 | HR 63 | Temp 97.0°F | Resp 18

## 2020-05-11 DIAGNOSIS — K922 Gastrointestinal hemorrhage, unspecified: Secondary | ICD-10-CM | POA: Diagnosis not present

## 2020-05-11 DIAGNOSIS — D500 Iron deficiency anemia secondary to blood loss (chronic): Secondary | ICD-10-CM | POA: Diagnosis not present

## 2020-05-11 DIAGNOSIS — K625 Hemorrhage of anus and rectum: Secondary | ICD-10-CM

## 2020-05-11 MED ORDER — SODIUM CHLORIDE 0.9 % IV SOLN: Freq: Once | INTRAVENOUS | Status: AC

## 2020-05-11 MED ORDER — SODIUM CHLORIDE 0.9 % IV SOLN
510.0000 mg | Freq: Once | INTRAVENOUS | Status: AC
  Administered 2020-05-11: 510 mg via INTRAVENOUS
  Filled 2020-05-11: qty 510

## 2020-05-11 NOTE — Patient Instructions (Signed)
[facility] at [facility]  Discharge Instructions: Feraheme _______________________________________________________________  Thank you for choosing [facility] at [facility] to provide your oncology and hematology care.  To afford each patient quality time with our providers, please arrive at least 15 minutes before your scheduled appointment.  You need to re-schedule your appointment if you arrive 10 or more minutes late.  We strive to give you quality time with our providers, and arriving late affects you and other patients whose appointments are after yours.  Also, if you no show three or more times for appointments you may be dismissed from the clinic.  Again, thank you for choosing  [facility] at [facility]. Our hope is that these requests will allow you access to exceptional care and in a timely manner. _______________________________________________________________  If you have questions after your visit, please contact our office at [phone] between the hours of 8:30 a.m. and 5:00 p.m. Voicemails left after 4:30 p.m. will not be returned until the following business day. _______________________________________________________________  For prescription refill requests, have your pharmacy contact our office. _______________________________________________________________  Recommendations made by the consultant and any test results will be sent to your referring physician. _______________________________________________________________ 

## 2020-05-11 NOTE — Progress Notes (Signed)
Patient presents today for Feraheme infusion . Vital signs stable. Patient denies any pain today. Patient has complaints of fatigue. MAR reviewed and updated.   Treatment given today per MD orders. Tolerated infusion without adverse affects. Vital signs stable. No complaints at this time. Discharged from clinic ambulatory in stable condition. Alert and oriented x 3. F/U with [facility] as scheduled.

## 2020-05-12 ENCOUNTER — Ambulatory Visit (HOSPITAL_COMMUNITY): Payer: Medicare Other

## 2020-05-26 DIAGNOSIS — Z20822 Contact with and (suspected) exposure to covid-19: Secondary | ICD-10-CM | POA: Diagnosis not present

## 2020-05-27 ENCOUNTER — Encounter: Payer: Self-pay | Admitting: Neurology

## 2020-05-27 ENCOUNTER — Ambulatory Visit (INDEPENDENT_AMBULATORY_CARE_PROVIDER_SITE_OTHER): Payer: Medicare Other | Admitting: Neurology

## 2020-05-27 VITALS — BP 125/68 | HR 63 | Ht 63.5 in | Wt 213.0 lb

## 2020-05-27 DIAGNOSIS — M5431 Sciatica, right side: Secondary | ICD-10-CM | POA: Diagnosis not present

## 2020-05-27 DIAGNOSIS — M79671 Pain in right foot: Secondary | ICD-10-CM | POA: Diagnosis not present

## 2020-05-27 DIAGNOSIS — M79604 Pain in right leg: Secondary | ICD-10-CM

## 2020-05-27 DIAGNOSIS — M79605 Pain in left leg: Secondary | ICD-10-CM | POA: Diagnosis not present

## 2020-05-27 DIAGNOSIS — M5432 Sciatica, left side: Secondary | ICD-10-CM | POA: Diagnosis not present

## 2020-05-27 DIAGNOSIS — M79672 Pain in left foot: Secondary | ICD-10-CM | POA: Diagnosis not present

## 2020-05-27 DIAGNOSIS — M25579 Pain in unspecified ankle and joints of unspecified foot: Secondary | ICD-10-CM | POA: Diagnosis not present

## 2020-05-27 DIAGNOSIS — R412 Retrograde amnesia: Secondary | ICD-10-CM | POA: Diagnosis not present

## 2020-05-27 DIAGNOSIS — E118 Type 2 diabetes mellitus with unspecified complications: Secondary | ICD-10-CM

## 2020-05-27 NOTE — Patient Instructions (Addendum)
Gout  Gout is painful swelling of your joints. Gout is a type of arthritis. It is caused by having too much uric acid in your body. Uric acid is a chemical that is made when your body breaks down substances called purines. If your body has too much uric acid, sharp crystals can form and build up in your joints. This causes pain and swelling. Gout attacks can happen quickly and be very painful (acute gout). Over time, the attacks can affect more joints and happen more often (chronic gout). What are the causes?  Too much uric acid in your blood. This can happen because: ? Your kidneys do not remove enough uric acid from your blood. ? Your body makes too much uric acid. ? You eat too many foods that are high in purines. These foods include organ meats, some seafood, and beer.  Trauma or stress. What increases the risk?  Having a family history of gout.  Being female and middle-aged.  Being female and having gone through menopause.  Being very overweight (obese).  Drinking alcohol, especially beer.  Not having enough water in the body (being dehydrated).  Losing weight too quickly.  Having an organ transplant.  Having lead poisoning.  Taking certain medicines.  Having kidney disease.  Having a skin condition called psoriasis. What are the signs or symptoms? An attack of acute gout usually happens in just one joint. The most common place is the big toe. Attacks often start at night. Other joints that may be affected include joints of the feet, ankle, knee, fingers, wrist, or elbow. Symptoms of an attack may include:  Very bad pain.  Warmth.  Swelling.  Stiffness.  Shiny, red, or purple skin.  Tenderness. The affected joint may be very painful to touch.  Chills and fever. Chronic gout may cause symptoms more often. More joints may be involved. You may also have white or yellow lumps (tophi) on your hands or feet or in other areas near your joints.   How is this  treated?  Treatment for this condition has two phases: treating an acute attack and preventing future attacks.  Acute gout treatment may include: ? NSAIDs. ? Steroids. These are taken by mouth or injected into a joint. ? Colchicine. This medicine relieves pain and swelling. It can be given by mouth or through an IV tube.  Preventive treatment may include: ? Taking small doses of NSAIDs or colchicine daily. ? Using a medicine that reduces uric acid levels in your blood. ? Making changes to your diet. You may need to see a food expert (dietitian) about what to eat and drink to prevent gout. Follow these instructions at home: During a gout attack  If told, put ice on the painful area: ? Put ice in a plastic bag. ? Place a towel between your skin and the bag. ? Leave the ice on for 20 minutes, 2-3 times a day.  Raise (elevate) the painful joint above the level of your heart as often as you can.  Rest the joint as much as possible. If the joint is in your leg, you may be given crutches.  Follow instructions from your doctor about what you cannot eat or drink.   Avoiding future gout attacks  Eat a low-purine diet. Avoid foods and drinks such as: ? Liver. ? Kidney. ? Anchovies. ? Asparagus. ? Herring. ? Mushrooms. ? Mussels. ? Beer.  Stay at a healthy weight. If you want to lose weight, talk with your doctor. Do   not lose weight too fast.  Start or continue an exercise plan as told by your doctor. Eating and drinking  Drink enough fluids to keep your pee (urine) pale yellow.  If you drink alcohol: ? Limit how much you use to:  0-1 drink a day for women.  0-2 drinks a day for men. ? Be aware of how much alcohol is in your drink. In the [place] one drink equals one 12 oz bottle of beer (355 mL), one 5 oz glass of wine (148 mL), or one 1 oz glass of hard liquor (44 mL). General instructions  Take over-the-counter and prescription medicines only as told by your doctor.  Do  not drive or use heavy machinery while taking prescription pain medicine.  Return to your normal activities as told by your doctor. Ask your doctor what activities are safe for you.  Keep all follow-up visits as told by your doctor. This is important. Contact a doctor if:  You have another gout attack.  You still have symptoms of a gout attack after 10 days of treatment.  You have problems (side effects) because of your medicines.  You have chills or a fever.  You have burning pain when you pee (urinate).  You have pain in your lower back or belly. Get help right away if:  You have very bad pain.  Your pain cannot be controlled.  You cannot pee. Summary  Gout is painful swelling of the joints.  The most common site of pain is the big toe, but it can affect other joints.  Medicines and avoiding some foods can help to prevent and treat gout attacks. This information is not intended to replace advice given to you by your health care provider. Make sure you discuss any questions you have with your health care provider. Document Revised: 09/05/2017 Document Reviewed: 09/05/2017 Elsevier Patient Education  2021 [employer].  Sciatica  Sciatica is pain, weakness, tingling, or loss of feeling (numbness) along the sciatic nerve. The sciatic nerve starts in the lower back and goes down the back of each leg. Sciatica usually goes away on its own or with treatment. Sometimes, sciatica may come back (recur). What are the causes? This condition happens when the sciatic nerve is pinched or has pressure put on it. This may be the result of:  A disk in between the bones of the spine bulging out too far (herniated disk).  Changes in the spinal disks that occur with aging.  A condition that affects a muscle in the butt.  Extra bone growth near the sciatic nerve.  A break (fracture) of the area between your hip bones (pelvis).  Pregnancy.  Tumor. This is rare. What increases the  risk? You are more likely to develop this condition if you:  Play sports that put pressure or stress on the spine.  Have poor strength and ease of movement (flexibility).  Have had a back injury in the past.  Have had back surgery.  Sit for long periods of time.  Do activities that involve bending or lifting over and over again.  Are very overweight (obese). What are the signs or symptoms? Symptoms can vary from mild to very bad. They may include:  Any of these problems in the lower back, leg, hip, or butt: ? Mild tingling, loss of feeling, or dull aches. ? Burning sensations. ? Sharp pains.  Loss of feeling in the back of the calf or the sole of the foot.  Leg weakness.  Very  bad back pain that makes it hard to move. These symptoms may get worse when you cough, sneeze, or laugh. They may also get worse when you sit or stand for long periods of time. How is this treated? This condition often gets better without any treatment. However, treatment may include:  Changing or cutting back on physical activity when you have pain.  Doing exercises and stretching.  Putting ice or heat on the affected area.  Medicines that help: ? To relieve pain and swelling. ? To relax your muscles.  Shots (injections) of medicines that help to relieve pain, irritation, and swelling.  Surgery. Follow these instructions at home: Medicines  Take over-the-counter and prescription medicines only as told by your doctor.  Ask your doctor if the medicine prescribed to you: ? Requires you to avoid driving or using heavy machinery. ? Can cause trouble pooping (constipation). You may need to take these steps to prevent or treat trouble pooping:  Drink enough fluids to keep your pee (urine) pale yellow.  Take over-the-counter or prescription medicines.  Eat foods that are high in fiber. These include beans, whole grains, and fresh fruits and vegetables.  Limit foods that are high in fat and  sugar. These include fried or sweet foods. Managing pain  If told, put ice on the affected area. ? Put ice in a plastic bag. ? Place a towel between your skin and the bag. ? Leave the ice on for 20 minutes, 2-3 times a day.  If told, put heat on the affected area. Use the heat source that your doctor tells you to use, such as a moist heat pack or a heating pad. ? Place a towel between your skin and the heat source. ? Leave the heat on for 20-30 minutes. ? Remove the heat if your skin turns bright red. This is very important if you are unable to feel pain, heat, or cold. You may have a greater risk of getting burned.      Activity  Return to your normal activities as told by your doctor. Ask your doctor what activities are safe for you.  Avoid activities that make your symptoms worse.  Take short rests during the day. ? When you rest for a long time, do some physical activity or stretching between periods of rest. ? Avoid sitting for a long time without moving. Get up and move around at least one time each hour.  Exercise and stretch regularly, as told by your doctor.  Do not lift anything that is heavier than 10 lb (4.5 kg) while you have symptoms of sciatica. ? Avoid lifting heavy things even when you do not have symptoms. ? Avoid lifting heavy things over and over.  When you lift objects, always lift in a way that is safe for your body. To do this, you should: ? Bend your knees. ? Keep the object close to your body. ? Avoid twisting.   General instructions  Stay at a healthy weight.  Wear comfortable shoes that support your feet. Avoid wearing high heels.  Avoid sleeping on a mattress that is too soft or too hard. You might have less pain if you sleep on a mattress that is firm enough to support your back.  Keep all follow-up visits as told by your doctor. This is important. Contact a doctor if:  You have pain that: ? Wakes you up when you are sleeping. ? Gets worse  when you lie down. ? Is worse than the pain  you have had in the past. ? Lasts longer than 4 weeks.  You lose weight without trying. Get help right away if:  You cannot control when you pee (urinate) or poop (have a bowel movement).  You have weakness in any of these areas and it gets worse: ? Lower back. ? The area between your hip bones. ? Butt. ? Legs.  You have redness or swelling of your back.  You have a burning feeling when you pee. Summary  Sciatica is pain, weakness, tingling, or loss of feeling (numbness) along the sciatic nerve.  This condition happens when the sciatic nerve is pinched or has pressure put on it.  Sciatica can cause pain, tingling, or loss of feeling (numbness) in the lower back, legs, hips, and butt.  Treatment often includes rest, exercise, medicines, and putting ice or heat on the affected area. This information is not intended to replace advice given to you by your health care provider. Make sure you discuss any questions you have with your health care provider. Document Revised: 03/04/2018 Document Reviewed: 03/04/2018 Elsevier Patient Education  [place].

## 2020-05-27 NOTE — Progress Notes (Signed)
Provider:  Larey Seat, M D  Referring Provider: Asencion Noble, MD Primary Care Physician:  Asencion Noble, MD  Chief Complaint  Patient presents with  . 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 Aching mostly but sometimes stabbing , sharp, severe, unbearable and unable to maintain sleep. She started a regimen of bike exercises, and feels this is still not improving her leg pain- she managed a 45 miles biking exercise total.  She began having these shooting pain at age 70- and it was considered an Fibromyalgia- overlap. Her leg now draws up - its not extended- toes pain from hallux radiating to mid aspect of the medial  foot. No temperature sensation.  She adjusted diet and took guaifenesin at high doses, which is mucinex generic.   She has no history of poor circulation, ankle edema is present , though. Prediabetic. She has toe pain. GOUT ?  Her hip pain also can be radiating done, sciatic?  But she strongly feels that foot and ankle produce the pain radiating ascending up.     RV 05-05-2019: I have the pleasure of seeing Samra Pesch today in a revisit, the patient had been suffering a concussion probably without loss of consciousness in November 2020.  She is concerned about cognitive difficulties that arose after the concussion.  She did not have as much of a short-term memory loss but more confusion and word finding difficulties.  She also reports that her sleep has been "horrible".  She underwent an MRI of the brain but has ordered with and without contrast and that showed no abnormalities, mild microvascular chronic changes were noted and these are typical for age.  We did not see atrophy, scars inflammation or  infiltration.  Reviewed image in the presence of patient.  In the meantime she has taken Valproate for headaches, prescribed by Dr. Leonie Man on all. This has reduced the headaches and is not (yet) inducing any increase in appetite. We are weaning off. She has gained 30 pounds since the concussion.(!).  She has been prediabetic and needs to lose weight -- not gain. MOCA is in normal range, but patient feels subjectively that she is doing not well with memory.   Her PT sessions were d/c after her exercises hurt her knees. The balance exercises helped balance - but hurt the knee joints.   Montreal Cognitive Assessment  05/05/2019  Visuospatial/ Executive (0/5) 4  Naming (0/3) 3  Attention: Read list of digits (0/2) 2  Attention: Read list of letters (0/1) 1  Attention: Serial 7 subtraction starting at 100 (0/3) 3  Language: Repeat phrase (0/2) 2  Language : Fluency (0/1) 1  Abstraction (0/2) 2  Delayed Recall (0/5) 4  Orientation (0/6) 5  Total 27     HPI:  CAMRYN LAMPSON is a 70 y.o. female and was seen upon a referral from Dr. Willey Blade for a headache evaluation on 03-07-2019. 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  Her daughter works in a Astron[employer] her husband is a part time orthopedist at this time. On November 16th she injured herself with a shuffle trying to remove a plant which snapped back  was digging. She immediately developed a big goose egg over her left eye and eyebrow and jokingly stated that she looked like a clean from [employer].  2 days later a hematoma had descended into the left cheekbone and perioral area.  It was then that her daughter made her see another physician who then ordered a CT of the brain which reportedly came back negative.  However her symptoms did not get better as the external injuries resolved.  She states that she now still feels some dizziness- a spinning sensation of vertigo and she indicates a clockwise rotation. She is a CPAP user- and she noted  days before the injury that she can get lightheaded when she turns to sleep on the right.  No memory issues, but more confusion and wordfinding. Sleep has been horrible for while, she did well until mid- pandemic, but has become more unsettled , mind is racing at night. More over the last 3 month.  The vertigo didn't help. 12/31/ 2020 she fell down outdoors, on a staircase. She had looked to the side, caught a vertigo suddenly lost her footing, her balance and she fell backwards. New onset headaches. No bony injuries.    Social history - not a smoker, not a drinker= are cocktail, one at a time. She adheres to a high guaifenesin diet.    Review of Systems: Out of a complete 14 system review, the patient complains of only the following symptoms, and all other reviewed systems are negative.  see above  fibromyalgia in the past - now presenting with leg pain, aggravating , shooting, stabbing pain.    Diplopia vertigo, insomnia, and memory changes-Postconcussion. Has improved. Concussion does  explain Vertigo, postraumatic, positional dependent.  This I has resolved.   Memory dysfunction  Is not objectively present- but subjectively present. MOCA 27/30  Headaches had been migrainous , helped by Depakote PO, now gaining weight. Has to change to a differenet preventive .  Her current headaches are SINUS related.    Social History   Socioeconomic History  . Marital status: Married    Spouse name: J. Orland Dec, MD  . Number of children: 3  . Years of education: Not on file  . Highest education level: Not on file  Occupational History  . Occupation: AUDIOLOGIST, retired  Tobacco Use  . Smoking status: Never Smoker  . Smokeless tobacco: Never Used  . Tobacco comment: Pt reports tried once in 7th grade//ms  Vaping Use  . Vaping Use: Never used  Substance and Sexual Activity  . Alcohol use: Not Currently    Alcohol/week: 1.0 standard drink    Types: 1 Glasses of wine per week     Comment: rare  . Drug use: No  . Sexual activity: Never    Birth control/protection: None  Other Topics Concern  . Not on file  Social History Narrative   3 children & married to orthopedist. 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   Social Determinants of Health   Financial Resource Strain: Not on file  Food Insecurity: Not on file  Transportation Needs: Not on file  Physical Activity: Inactive  . Days of Exercise per Week: 0 days  . Minutes of Exercise per Session: 0 min  Stress: Not on file  Social Connections: Not on file  Intimate Partner Violence: Not At Risk  . Fear of Current or Ex-Partner: No  . Emotionally Abused: No  . Physically Abused: No  . Sexually Abused: No  Family History  Problem Relation Age of Onset  . Ulcers Mother   . Glaucoma Mother   . Heart disease Mother   . Diabetes Mother   . Depression Mother   . Sudden death Mother   . Obesity Mother   . Glaucoma Maternal Grandmother   . Heart disease Maternal Grandmother   . Heart disease Maternal Grandfather   . Colon cancer Neg Hx     Past Medical History:  Diagnosis Date  . Adenomatous colon polyp   . Allergic rhinitis   . Arthritis   . Back pain   . Constipation   . Diabetes mellitus   . Diverticulosis   . External hemorrhoids   . Fibromyalgia   . Gallbladder problem   . GERD (gastroesophageal reflux disease)   . Glaucoma   . Glaucoma   . Hemorrhoids    grade 3   . Hip pain   . IBS (irritable bowel syndrome)   . IDA (iron deficiency anemia)   . Iron deficiency anemia 06/05/2014  . Irregular heartbeat   . Leg edema   . Leg numbness   . Leg pain   . Low tension glaucoma   . Neuropathy, leg   . Ovarian cyst 11/20/00  . Prediabetes   . Sleep apnea    CPAP  . Sliding hiatal hernia     Past Surgical History:  Procedure Laterality Date  . CARPAL TUNNEL RELEASE Bilateral   . CATARACT EXTRACTION     bilateral  . CHOLECYSTECTOMY    . COLONOSCOPY  2005   Small external  hemorrhoids, single sigmoid colon diverticulum, small polyp at the hepatic flexure focally adenomatous.  . COLONOSCOPY  2010   Dr. Gala Romney, Versed 4/Demerol 75. Pancolonic diverticula, external hemorrhoids. Colonoscopy every 5 years for history of adenomatous polyps  . COLONOSCOPY  2014   Dr. Olevia Perches: Propofol. mild diverticulosis in the sigmoid colon, random colon biopsies benign.  . COLONOSCOPY WITH PROPOFOL N/A 09/13/2015   Procedure: COLONOSCOPY WITH PROPOFOL;  Surgeon: Daneil Dolin, MD;  Location: [facility];  Service: Endoscopy;  Laterality: N/A;  1030  . ESOPHAGEAL DILATION N/A 10/26/2014   Procedure: ESOPHAGEAL DILATION [place];  Surgeon: Daneil Dolin, MD;  Location: [facility];  Service: Endoscopy;  Laterality: N/A;  . ESOPHAGOGASTRODUODENOSCOPY  2010   Dr. Gala Romney, distal esophageal erosions, Venia Minks dilator passed. Comment small hiatal hernia, patulous EG junction.  . ESOPHAGOGASTRODUODENOSCOPY (EGD) WITH PROPOFOL N/A 10/26/2014   Dr.Rourk- distal esophageal erosions c/w mild erosive reflux esophagitis. hiatal hernia, o/w normal EGD  . ESOPHAGOGASTRODUODENOSCOPY (EGD) WITH PROPOFOL N/A 09/13/2015   Procedure: ESOPHAGOGASTRODUODENOSCOPY (EGD) WITH PROPOFOL;  Surgeon: Daneil Dolin, MD;  Location: [facility];  Service: Endoscopy;  Laterality: N/A;  . GIVENS CAPSULE STUDY N/A 10/26/2014   couple of mucosal breaks/erosions but essentially unremarkable small bowel study  . HEMORRHOID BANDING    . KNEE SURGERY     right-arthroscopy  . OOPHORECTOMY  age 49s  . PARTIAL HYSTERECTOMY  age 18s  . RECTAL EXAM UNDER ANESTHESIA N/A 10/26/2014   circumferential grade 3 hemoorrhoids easily reducible  . thumb surger Bilateral    trigger thumb    Current Outpatient Medications  Medication Sig Dispense Refill  . acetaminophen (TYLENOL) 500 MG tablet Take 500 mg by mouth every 6 (six) hours as needed.    . cetirizine (ZYRTEC) 10 MG tablet Take 10 mg by mouth daily.     . cyclobenzaprine  (FLEXERIL) 10 MG tablet Take 1 tablet (  10 mg total) by mouth 3 (three) times daily as needed for muscle spasms. 42 tablet 1  . HYDROcodone-acetaminophen (NORCO/VICODIN) 5-325 MG tablet Take 1 tablet by mouth every 6 (six) hours as needed for moderate pain. 60 tablet 0  . LUMIGAN 0.01 % SOLN     . Magnesium 400 MG TABS Take 1 tablet by mouth daily.    . Multiple Vitamins-Minerals (MULTIVITAMIN ADULT PO) Take 1 tablet by mouth daily.    . naproxen sodium (ALEVE) 220 MG tablet Take 220 mg by mouth.    . NON FORMULARY CPAP at night     . timolol (BETIMOL) 0.5 % ophthalmic solution Betimol 0.5 % eye drops    . Wheat Dextrin (BENEFIBER) POWD Take by mouth daily.     No current facility-administered medications for this visit.    Allergies as of 05/27/2020 - Review Complete 05/27/2020  Allergen Reaction Noted  . Injectafer [ferric carboxymaltose]  06/14/2016    Vitals: BP 125/68   Pulse 63   Ht 5' 3.5" (1.613 m)   Wt 213 lb (96.6 kg)   BMI 37.14 kg/m  Last Weight:  Wt Readings from Last 1 Encounters:  05/27/20 213 lb (96.6 kg)   Last Height:   Ht Readings from Last 1 Encounters:  05/27/20 5' 3.5" (1.613 m)    Physical exam:  General: The patient is awake, alert and appears not in acute distress. The patient is well groomed. Head: Normocephalic, atraumatic. Neck is supple. Mallampati 2-3/ She has severely congested nasal passages, and noted that it is allergy season for her.  Cardiovascular:  Regular rate and rhythm, without murmurs or carotid bruit, and without distended neck veins. Respiratory: Lungs are clear to auscultation. Skin:  Without evidence of edema, or rash Trunk: BMI is 37 ( elevated )and patient  has normal posture.  Neurologic exam : The patient is awake and alert, oriented to place and time.    Memory subjective  described as impaired.  There is a normal attention span & concentration ability.  Speech is fluent without  dysarthria, but dysphonia or aphasia.   Mood and affect are appropriate.  Cranial nerves: Pupils are equal and briskly reactive to light Extraocular movements  in vertical and horizontal planes without  nystagmus, with lateral gaze. .  Visual fields by finger perimetry are intact. Hearing to finger rub intact.  Facial sensation intact to fine touch. Facial motor strength is symmetric and tongue and uvula move midline.  Tongue protrusion into either cheek is normal. Shoulder shrug is normal.   Motor exam: Normal tone ,muscle bulk and symmetric strength in all extremities. Her grip strength is affected by arthritis, pain and limited ROM.  Weak pinch strength, penmanship is good, though.  Sensory:  Fine touch, pinprick and vibration were normal. She has arthritis in both wrists and toe joint.  She reports leg tingling and numbness in both legs.  Coordination: Rapid alternating movements in the fingers/hands were normal.  Finger-to-nose maneuver  normal without evidence of ataxia, dysmetria or tremor. Gait and station: Patient walks without assistive device  Strength within normal limits.  Stance is stable and normal. \Tandem gait is unfragmented. Romberg testing is positive.   Deep tendon reflexes: in the  upper and lower extremities are symmetric and intact. Babinski maneuver response is  downgoing.   I spent a total time of 25 minutes with this patient - new problem assessment -  There are 2 pain components here- one hip, low back , descending in  the hamstrings,  the second, more aggravating is ascending form yhe toe, ( hallux, foot and ankle to wards the knee in front of the leg . Concussion is slowly improving.   Assessment:  After physical and neurologic examination, review of laboratory studies, imaging, neurophysiology testing and pre-existing records, assessment is that of :   Sciatica/ fibromyalgia, arthritis pain?     Ascending from the toe joint is a deep, bone pain, shooting , not changing the sensation on the skin-  DEEP< severe pain and aggravating when trying to sleep. IT WAKES HER UP_  Plan:  Treatment plan and additional workup :  Gout ? Will order uric acid panel, rheumatological battery.  circulation - order venous doppler. EMG and NCV for sciatica and for possible femoral nerve , tibial nerve. Both sides !     RV prn.  Asencion Partridge Loreal Schuessler MD 05/27/2020

## 2020-05-28 LAB — URIC ACID: Uric Acid: 5.2 mg/dL (ref 3.0–7.2)

## 2020-05-31 ENCOUNTER — Telehealth: Payer: Self-pay | Admitting: Neurology

## 2020-05-31 DIAGNOSIS — Z23 Encounter for immunization: Secondary | ICD-10-CM | POA: Diagnosis not present

## 2020-05-31 NOTE — Telephone Encounter (Signed)
-----   Message from Larey Seat, MD sent at 05/31/2020 11:10 AM EDT ----- Not gout related pain, uric acid was normal.

## 2020-05-31 NOTE — Telephone Encounter (Signed)
Called the patient and the uric acid was normal. No issues or concerns. Pt had questions about the venous doppler that was ordered being done at [facility]. Advised I would let there referrals team that sends that order out. Pt verbalized understanding.

## 2020-05-31 NOTE — Progress Notes (Signed)
Not gout related pain, uric acid was normal.

## 2020-06-01 NOTE — Telephone Encounter (Signed)
UP date Debra Harris called and spoke to patient she is calling and getting her apt . scheduled for her venous .

## 2020-06-01 NOTE — Telephone Encounter (Signed)
[name] this can be done . At [place] just spoke to [place] and they do  things Different at [facility] I will call patient and explain Process to her . Thanks Hinton Dyer   Patient has to call [name] after I check auth at [phone]  [address] and she schedules her own apt.  [name] does not have a work  Art therapist that they work from .

## 2020-06-02 ENCOUNTER — Ambulatory Visit (INDEPENDENT_AMBULATORY_CARE_PROVIDER_SITE_OTHER): Payer: Medicare Other | Admitting: Neurology

## 2020-06-02 ENCOUNTER — Encounter (INDEPENDENT_AMBULATORY_CARE_PROVIDER_SITE_OTHER): Payer: Medicare Other | Admitting: Neurology

## 2020-06-02 DIAGNOSIS — M25579 Pain in unspecified ankle and joints of unspecified foot: Secondary | ICD-10-CM

## 2020-06-02 DIAGNOSIS — M5431 Sciatica, right side: Secondary | ICD-10-CM

## 2020-06-02 DIAGNOSIS — M79672 Pain in left foot: Secondary | ICD-10-CM

## 2020-06-02 DIAGNOSIS — M79604 Pain in right leg: Secondary | ICD-10-CM | POA: Diagnosis not present

## 2020-06-02 DIAGNOSIS — M5432 Sciatica, left side: Secondary | ICD-10-CM

## 2020-06-02 DIAGNOSIS — Z0289 Encounter for other administrative examinations: Secondary | ICD-10-CM

## 2020-06-02 DIAGNOSIS — M79671 Pain in right foot: Secondary | ICD-10-CM

## 2020-06-02 DIAGNOSIS — M79605 Pain in left leg: Secondary | ICD-10-CM

## 2020-06-02 NOTE — Procedures (Signed)
Full Name: Breane Grunwald Gender: Female MRN #: [id number] Date of Birth: 10-11-1950    Visit Date: 06/02/2020 07:26 Age: 70 Years Examining Physician: Marcial Pacas, MD  Referring Physician: Larey Seat, MD History: 70 year old female presented with few years history of symmetric bilateral feet paresthesia, ankle discomfort  On examination: Mild bilateral lateral ankle swelling, no distal weakness, length dependent decreased pinprick to ankle level, decreased vibratory sensation at toes, absent ankle reflex,  Summary of the test: Nerve conduction study: Bilateral sural, superficial peroneal sensory responses were absent. Bilateral tibial, peroneal to EDB motor responses were normal. Left ulnar sensory and motor responses were normal.  Electromyography: Selected needle examination of bilateral lower extremity muscles and bilateral lumbosacral paraspinal muscles were performed. The only abnormality is mildly enlarged motor unit potential at left abductor hallucis, with mildly decreased recruitment patterns, there was no spontaneous activity.  Conclusion: This is an abnormal study.  There is electrodiagnostic evidence of mild axonal sensorimotor polyneuropathy, mainly involving sensory component.  There is no evidence of bilateral lumbosacral radiculopathy.    ------------------------------- Marcial Pacas, M.D. PhD  [facility] [address], [place], [place] [address] Tel: [phone] Fax: [phone]  Verbal informed consent was obtained from the patient, patient was informed of potential risk of procedure, including bruising, bleeding, hematoma formation, infection, muscle weakness, muscle pain, numbness, among others.        [place]    Nerve / Sites Muscle Latency Ref. Amplitude Ref. Rel Amp Segments Distance Velocity Ref. Area    ms ms mV mV %  cm m/s m/s mVms  L Ulnar - ADM     Wrist ADM 2.7 ?3.3 11.4 ?6.0 100 Wrist - ADM 7   32.1      B.Elbow ADM 5.5  11.0  96.4 B.Elbow - Wrist 16 58 ?49 31.3     A.Elbow ADM 7.2  10.6  96.5 A.Elbow - B.Elbow 10 56 ?49 29.9  R Peroneal - EDB     Ankle EDB 3.8 ?6.5 2.4 ?2.0 100 Ankle - EDB 9   10.3     Fib head EDB 10.4  1.8  77.7 Fib head - Ankle 29 44 ?44 9.4     Pop fossa EDB 12.6  1.7  93.5 Pop fossa - Fib head 10 44 ?44 7.5         Pop fossa - Ankle      L Peroneal - EDB     Ankle EDB 2.5 ?6.5 2.6 ?2.0 100 Ankle - EDB 9   9.6     Fib head EDB 8.4  2.2  84.6 Fib head - Ankle 29 49 ?44 7.5     Pop fossa EDB 10.5  1.7  76.9 Pop fossa - Fib head 10 47 ?44 5.7         Pop fossa - Ankle      R Tibial - AH     Ankle AH 3.1 ?5.8 8.0 ?4.0 100 Ankle - AH 9   20.2     Pop fossa AH 12.1  6.1  76.8 Pop fossa - Ankle 38 42 ?41 17.5  L Tibial - AH     Ankle AH 3.0 ?5.8 6.5 ?4.0 100 Ankle - AH 9   17.5     Pop fossa AH 11.7  4.4  67.8 Pop fossa - Ankle 37 42 ?41 13.8               SNC  Nerve / Sites Rec. Site Peak Lat Ref.  Amp Ref. Segments Distance    ms ms V V  cm  R Sural - Ankle (Calf)     Calf Ankle NR ?4.4 NR ?6 Calf - Ankle 14  L Sural - Ankle (Calf)     Calf Ankle NR ?4.4 NR ?6 Calf - Ankle 14  R Superficial peroneal - Ankle     Lat leg Ankle NR ?4.4 NR ?6 Lat leg - Ankle 14  L Superficial peroneal - Ankle     Lat leg Ankle NR ?4.4 NR ?6 Lat leg - Ankle 14  L Ulnar - Orthodromic, (Dig V, Mid palm)     Dig V Wrist 2.7 ?3.1 5 ?5 Dig V - Wrist 3               F  Wave    Nerve F Lat Ref.   ms ms  R Tibial - AH 55.2 ?56.0  L Tibial - AH 56.0 ?56.0  L Ulnar - ADM 26.3 ?32.0           EMG Summary Table    Spontaneous MUAP Recruitment  Muscle IA Fib PSW Fasc Other Amp Dur. Poly Pattern  L. Tibialis anterior Normal None None None _______ Normal Normal Normal Normal  L. Tibialis posterior Normal None None None _______ Normal Normal Normal Normal  L. Peroneus longus Normal None None None _______ Normal Normal Normal Normal  L. Vastus lateralis Normal None None None _______  Normal Normal Normal Normal  L. Gastrocnemius (Medial head) Normal None None None _______ Normal Normal Normal Normal  L. Abductor hallucis Normal None None None _______ Normal Normal Normal Reduced  R. Tibialis anterior Normal None None None _______ Normal Normal Normal Normal  R. Tibialis posterior Normal None None None _______ Normal Normal Normal Normal  R. Peroneus longus Normal None None None _______ Normal Normal Normal Normal  R. Gastrocnemius (Medial head) Normal None None None _______ Normal Normal Normal Normal  R. Lumbar paraspinals (low) Normal None None None _______ Normal Normal Normal Normal  R. Lumbar paraspinals (mid) Normal None None None _______ Normal Normal Normal Normal  L. Lumbar paraspinals (low) Normal None None None _______ Normal Normal Normal Normal  L. Lumbar paraspinals (mid) Normal None None None _______ Normal Normal Normal Normal

## 2020-06-04 NOTE — Progress Notes (Signed)
Conclusion: This is an abnormal study.  There is electrodiagnostic evidence of mild axonal sensorimotor polyneuropathy, mainly involving sensory component.  There is no evidence of bilateral lumbosacral radiculopathy.    ------------------------------- Marcial Pacas, M.D. PhD

## 2020-06-08 ENCOUNTER — Other Ambulatory Visit: Payer: Self-pay

## 2020-06-08 ENCOUNTER — Ambulatory Visit (HOSPITAL_COMMUNITY)
Admission: RE | Admit: 2020-06-08 | Discharge: 2020-06-08 | Disposition: A | Discharge status: Home / Self Care | Payer: Medicare Other | Source: Ambulatory Visit | Attending: Neurology | Admitting: Neurology

## 2020-06-08 ENCOUNTER — Encounter: Payer: Self-pay | Admitting: Neurology

## 2020-06-08 DIAGNOSIS — M5431 Sciatica, right side: Secondary | ICD-10-CM

## 2020-06-08 DIAGNOSIS — M79672 Pain in left foot: Secondary | ICD-10-CM | POA: Diagnosis not present

## 2020-06-08 DIAGNOSIS — M79605 Pain in left leg: Secondary | ICD-10-CM | POA: Diagnosis not present

## 2020-06-08 DIAGNOSIS — R600 Localized edema: Secondary | ICD-10-CM | POA: Diagnosis not present

## 2020-06-08 DIAGNOSIS — M79671 Pain in right foot: Secondary | ICD-10-CM | POA: Diagnosis not present

## 2020-06-08 DIAGNOSIS — M79604 Pain in right leg: Secondary | ICD-10-CM | POA: Diagnosis not present

## 2020-06-08 DIAGNOSIS — M5432 Sciatica, left side: Secondary | ICD-10-CM | POA: Diagnosis not present

## 2020-06-08 DIAGNOSIS — M25579 Pain in unspecified ankle and joints of unspecified foot: Secondary | ICD-10-CM | POA: Diagnosis not present

## 2020-06-09 ENCOUNTER — Encounter: Payer: Self-pay | Admitting: Neurology

## 2020-06-09 ENCOUNTER — Inpatient Hospital Stay (HOSPITAL_COMMUNITY): Payer: Medicare Other | Attending: Hematology

## 2020-06-09 VITALS — BP 120/61 | HR 64 | Temp 97.1°F | Resp 18

## 2020-06-09 DIAGNOSIS — D500 Iron deficiency anemia secondary to blood loss (chronic): Secondary | ICD-10-CM | POA: Insufficient documentation

## 2020-06-09 DIAGNOSIS — K922 Gastrointestinal hemorrhage, unspecified: Secondary | ICD-10-CM | POA: Diagnosis not present

## 2020-06-09 DIAGNOSIS — K625 Hemorrhage of anus and rectum: Secondary | ICD-10-CM

## 2020-06-09 MED ORDER — SODIUM CHLORIDE 0.9 % IV SOLN: Freq: Once | INTRAVENOUS | Status: AC

## 2020-06-09 MED ORDER — SODIUM CHLORIDE 0.9 % IV SOLN
510.0000 mg | Freq: Once | INTRAVENOUS | Status: AC
  Administered 2020-06-09: 510 mg via INTRAVENOUS
  Filled 2020-06-09: qty 510

## 2020-06-09 NOTE — Progress Notes (Signed)
Iron infusion given per orders. Patient tolerated it well without problems. Vitals stable and discharged home from clinic ambulatory. Follow up as scheduled.  

## 2020-06-09 NOTE — Progress Notes (Signed)
Leg swelling , question of claudication or thrombosis.   IMPRESSION: No evidence of DVT within either lower extremity.

## 2020-06-09 NOTE — Patient Instructions (Signed)
[facility]  Discharge Instructions:   _______________________________________________________________  Thank you for choosing [facility] to provide your oncology and hematology care.  To afford each patient quality time with our providers, please arrive at least 15 minutes before your scheduled appointment.  You need to re-schedule your appointment if you arrive 10 or more minutes late.  We strive to give you quality time with our providers, and arriving late affects you and other patients whose appointments are after yours.  Also, if you no show three or more times for appointments you may be dismissed from the clinic.  Again, thank you for choosing [facility]. Our hope is that these requests will allow you access to exceptional care and in a timely manner. _______________________________________________________________  If you have questions after your visit, please contact our office at [phone] between the hours of 8:30 a.m. and 5:00 p.m. Voicemails left after 4:30 p.m. will not be returned until the following business day. _______________________________________________________________  For prescription refill requests, have your pharmacy contact our office. _______________________________________________________________  Recommendations made by the consultant and any test results will be sent to your referring physician. _______________________________________________________________ 

## 2020-06-10 ENCOUNTER — Ambulatory Visit (HOSPITAL_COMMUNITY): Payer: Medicare Other

## 2020-07-16 DIAGNOSIS — H43813 Vitreous degeneration, bilateral: Secondary | ICD-10-CM | POA: Diagnosis not present

## 2020-07-16 DIAGNOSIS — D3131 Benign neoplasm of right choroid: Secondary | ICD-10-CM | POA: Diagnosis not present

## 2020-07-16 DIAGNOSIS — Z961 Presence of intraocular lens: Secondary | ICD-10-CM | POA: Diagnosis not present

## 2020-07-16 DIAGNOSIS — H04123 Dry eye syndrome of bilateral lacrimal glands: Secondary | ICD-10-CM | POA: Diagnosis not present

## 2020-07-16 DIAGNOSIS — H401132 Primary open-angle glaucoma, bilateral, moderate stage: Secondary | ICD-10-CM | POA: Diagnosis not present

## 2020-07-16 DIAGNOSIS — E119 Type 2 diabetes mellitus without complications: Secondary | ICD-10-CM | POA: Diagnosis not present

## 2020-08-24 NOTE — Progress Notes (Deleted)
No chief complaint on file.    HISTORY OF PRESENT ILLNESS: 08/24/20 ALL:  Debra Harris is a 70 y.o. female here today for follow up for bilateral leg and foot pain. NCS/EMG showed mild axonal sensorimotor polyneuropathy. No evidence of lumbarsacral radiculopathy. Lower extremity dopplers were normal. Uric acid level normal. OSA followed by [facility].    HISTORY (copied from Dr Dohmeier's previous note)  RV on 05-27-2020 , Mrs Lepage, wife of a retired Doctor, general practice,  with a new problem- a leg pain in ankle radiating up to right knee, shin splint pain, sometimes shoots to hip and groin area! Aching mostly but sometimes stabbing , sharp, severe, unbearable and unable to maintain sleep. She started a regimen of bike exercises, and feels this is still not improving her leg pain- she managed a 45 miles biking exercise total.  She began having these shooting pain at age 9- and it was considered an Fibromyalgia- overlap. Her leg now draws up - its not extended- toes pain from hallux radiating to mid aspect of the medial  foot. No temperature sensation. She adjusted diet and took guaifenesin at high doses, which is mucinex generic.   She has no history of poor circulation, ankle edema is present , though. Prediabetic. She has toe pain. GOUT ?  Her hip pain also can be radiating done, sciatic? But she strongly feels that foot and ankle produce the pain radiating ascending up.   REVIEW OF SYSTEMS: Out of a complete 14 system review of symptoms, the patient complains only of the following symptoms, bilateral leg and foot pain, and all other reviewed systems are negative.   ALLERGIES: Allergies  Allergen Reactions   Injectafer [Ferric Carboxymaltose]     Has tolerated feraheme without problems     HOME MEDICATIONS: Outpatient Medications Prior to Visit  Medication Sig Dispense Refill   acetaminophen (TYLENOL) 500 MG tablet Take 500 mg by mouth every 6 (six) hours as  needed.     cetirizine (ZYRTEC) 10 MG tablet Take 10 mg by mouth daily.      LUMIGAN 0.01 % SOLN      Magnesium 400 MG TABS Take 1 tablet by mouth daily.     Multiple Vitamins-Minerals (MULTIVITAMIN ADULT PO) Take 1 tablet by mouth daily.     naproxen sodium (ALEVE) 220 MG tablet Take 220 mg by mouth.     NON FORMULARY CPAP at night      timolol (BETIMOL) 0.5 % ophthalmic solution Betimol 0.5 % eye drops     Wheat Dextrin (BENEFIBER) POWD Take by mouth daily.     No facility-administered medications prior to visit.     PAST MEDICAL HISTORY: Past Medical History:  Diagnosis Date   Adenomatous colon polyp    Allergic rhinitis    Arthritis    Back pain    Constipation    Diabetes mellitus    Diverticulosis    External hemorrhoids    Fibromyalgia    Gallbladder problem    GERD (gastroesophageal reflux disease)    Glaucoma    Glaucoma    Hemorrhoids    grade 3    Hip pain    IBS (irritable bowel syndrome)    IDA (iron deficiency anemia)    Iron deficiency anemia 06/05/2014   Irregular heartbeat    Leg edema    Leg numbness    Leg pain    Low tension glaucoma    Neuropathy, leg    Ovarian cyst 11/20/00  Prediabetes    Sleep apnea    CPAP   Sliding hiatal hernia      PAST SURGICAL HISTORY: Past Surgical History:  Procedure Laterality Date   CARPAL TUNNEL RELEASE Bilateral    CATARACT EXTRACTION     bilateral   CHOLECYSTECTOMY     COLONOSCOPY  2005   Small external hemorrhoids, single sigmoid colon diverticulum, small polyp at the hepatic flexure focally adenomatous.   COLONOSCOPY  2010   Dr. Gala Romney, Versed 4/Demerol 75. Pancolonic diverticula, external hemorrhoids. Colonoscopy every 5 years for history of adenomatous polyps   COLONOSCOPY  2014   Dr. Olevia Perches: Propofol. mild diverticulosis in the sigmoid colon, random colon biopsies benign.   COLONOSCOPY WITH PROPOFOL N/A 09/13/2015   Procedure: COLONOSCOPY WITH PROPOFOL;  Surgeon: Daneil Dolin, MD;  [facility];  Service: Endoscopy;  Laterality: N/A;  1030   ESOPHAGEAL DILATION N/A 10/26/2014   Procedure: ESOPHAGEAL DILATION [place];  Surgeon: Daneil Dolin, MD;  Location: [facility];  Service: Endoscopy;  Laterality: N/A;   ESOPHAGOGASTRODUODENOSCOPY  2010   Dr. Gala Romney, distal esophageal erosions, Venia Minks dilator passed. Comment small hiatal hernia, patulous EG junction.   ESOPHAGOGASTRODUODENOSCOPY (EGD) WITH PROPOFOL N/A 10/26/2014   Dr.Rourk- distal esophageal erosions c/w mild erosive reflux esophagitis. hiatal hernia, o/w normal EGD   ESOPHAGOGASTRODUODENOSCOPY (EGD) WITH PROPOFOL N/A 09/13/2015   Procedure: ESOPHAGOGASTRODUODENOSCOPY (EGD) WITH PROPOFOL;  Surgeon: Daneil Dolin, MD;  [facility];  Service: Endoscopy;  Laterality: N/A;   GIVENS CAPSULE STUDY N/A 10/26/2014   couple of mucosal breaks/erosions but essentially unremarkable small bowel study   HEMORRHOID BANDING     KNEE SURGERY     right-arthroscopy   OOPHORECTOMY  age 35s   PARTIAL HYSTERECTOMY  age 85s   RECTAL EXAM UNDER ANESTHESIA N/A 10/26/2014   circumferential grade 3 hemoorrhoids easily reducible   thumb surger Bilateral    trigger thumb     FAMILY HISTORY: Family History  Problem Relation Age of Onset   Ulcers Mother    Glaucoma Mother    Heart disease Mother    Diabetes Mother    Depression Mother    Sudden death Mother    Obesity Mother    Glaucoma Maternal Grandmother    Heart disease Maternal Grandmother    Heart disease Maternal Grandfather    Colon cancer Neg Hx      SOCIAL HISTORY: Social History   Socioeconomic History   Marital status: Married    Spouse name: J. Orland Dec, MD   Number of children: 3   Years of education: Not on file   Highest education level: Not on file  Occupational History   Occupation: AUDIOLOGIST, retired  Tobacco Use   Smoking status: Never   Smokeless tobacco: Never   Tobacco comments:    Pt reports tried once in 7th grade//ms  Vaping  Use   Vaping Use: Never used  Substance and Sexual Activity   Alcohol use: Not Currently    Alcohol/week: 1.0 standard drink    Types: 1 Glasses of wine per week    Comment: rare   Drug use: No   Sexual activity: Never    Birth control/protection: None  Other Topics Concern   Not on file  Social History Narrative   3 children & married to orthopedist. 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   Social Determinants of Health   Financial Resource Strain: Not on file  Food Insecurity: Not on file  Transportation Needs: Not on file  Physical Activity: Inactive   Days of Exercise per Week: 0 days   Minutes of Exercise per Session: 0 min  Stress: Not on file  Social Connections: Not on file  Intimate Partner Violence: Not At Risk   Fear of Current or Ex-Partner: No   Emotionally Abused: No   Physically Abused: No   Sexually Abused: No     PHYSICAL EXAM  There were no vitals filed for this visit. There is no height or weight on file to calculate BMI.   Generalized: Well developed, in no acute distress  Cardiology: normal rate and rhythm, no murmur auscultated  Respiratory: clear to auscultation bilaterally    Neurological examination  Mentation: Alert oriented to time, place, history taking. Follows all commands speech and language fluent Cranial nerve II-XII: Pupils were equal round reactive to light. Extraocular movements were full, visual field were full on confrontational test. Facial sensation and strength were normal. Uvula tongue midline. Head turning and shoulder shrug  were normal and symmetric. Motor: The motor testing reveals 5 over 5 strength of all 4 extremities. Good symmetric motor tone is noted throughout.  Sensory: Sensory testing is intact to soft touch on all 4 extremities. No evidence of extinction is noted.  Coordination: Cerebellar testing reveals good finger-nose-finger and heel-to-shin bilaterally.  Gait and station: Gait is normal. Tandem gait  is normal. Romberg is negative. No drift is seen.  Reflexes: Deep tendon reflexes are symmetric and normal bilaterally.    DIAGNOSTIC DATA (LABS, IMAGING, TESTING) - I reviewed patient records, labs, notes, testing and imaging myself where available.  Lab Results  Component Value Date   WBC 4.5 03/23/2020   HGB 12.5 03/23/2020   HCT 39.4 03/23/2020   MCV 92.3 03/23/2020   PLT 241 03/23/2020      Component Value Date/Time   NA 138 03/23/2020 1308   NA 144 03/19/2018 0000   K 3.8 03/23/2020 1308   CL 103 03/23/2020 1308   CO2 28 03/23/2020 1308   GLUCOSE 108 (H) 03/23/2020 1308   BUN 12 03/23/2020 1308   BUN 24 (A) 03/19/2018 0000   CREATININE 0.70 03/23/2020 1308   CALCIUM 9.7 03/23/2020 1308   PROT 7.6 03/23/2020 1308   PROT 7.0 01/08/2018 1555   ALBUMIN 4.3 03/23/2020 1308   ALBUMIN 4.2 01/08/2018 1555   AST 20 03/23/2020 1308   ALT 23 03/23/2020 1308   ALKPHOS 56 03/23/2020 1308   BILITOT 0.8 03/23/2020 1308   BILITOT 0.8 01/08/2018 1555   GFRNONAA >60 03/23/2020 1308   GFRAA >60 10/09/2019 1415   Lab Results  Component Value Date   CHOL 162 03/19/2018   HDL 46 03/19/2018   LDLCALC 102 03/19/2018   TRIG 70 03/19/2018   Lab Results  Component Value Date   HGBA1C 5.7 03/19/2018   Lab Results  Component Value Date   YYTKPTWS56 812 03/23/2020   Lab Results  Component Value Date   TSH 0.718 07/25/2017    No flowsheet data found.   Montreal Cognitive Assessment  05/05/2019  Visuospatial/ Executive (0/5) 4  Naming (0/3) 3  Attention: Read list of digits (0/2) 2  Attention: Read list of letters (0/1) 1  Attention: Serial 7 subtraction starting at 100 (0/3) 3  Language: Repeat phrase (0/2) 2  Language : Fluency (0/1) 1  Abstraction (0/2) 2  Delayed Recall (0/5) 4  Orientation (0/6) 5  Total 27     ASSESSMENT AND PLAN  70 y.o.  year old female  has a past medical history of Adenomatous colon polyp, Allergic rhinitis, Arthritis, Back pain,  Constipation, Diabetes mellitus, Diverticulosis, External hemorrhoids, Fibromyalgia, Gallbladder problem, GERD (gastroesophageal reflux disease), Glaucoma, Glaucoma, Hemorrhoids, Hip pain, IBS (irritable bowel syndrome), IDA (iron deficiency anemia), Iron deficiency anemia (06/05/2014), Irregular heartbeat, Leg edema, Leg numbness, Leg pain, Low tension glaucoma, Neuropathy, leg, Ovarian cyst (11/20/00), Prediabetes, Sleep apnea, and Sliding hiatal hernia. here with    No diagnosis found.   No orders of the defined types were placed in this encounter.    No orders of the defined types were placed in this encounter.     Debbora Presto, MSN, FNP-C 08/24/2020, 12:53 PM  [facility] [address], [place], [place] [address] [phone]

## 2020-08-26 ENCOUNTER — Ambulatory Visit: Payer: Medicare Other | Admitting: Family Medicine

## 2020-09-23 ENCOUNTER — Other Ambulatory Visit (HOSPITAL_COMMUNITY): Payer: Medicare Other

## 2020-09-27 ENCOUNTER — Inpatient Hospital Stay (HOSPITAL_COMMUNITY): Payer: Medicare Other | Attending: Hematology

## 2020-09-27 ENCOUNTER — Other Ambulatory Visit: Payer: Self-pay

## 2020-09-27 DIAGNOSIS — D500 Iron deficiency anemia secondary to blood loss (chronic): Secondary | ICD-10-CM

## 2020-09-27 DIAGNOSIS — D509 Iron deficiency anemia, unspecified: Secondary | ICD-10-CM | POA: Diagnosis not present

## 2020-09-27 LAB — COMPREHENSIVE METABOLIC PANEL
ALT: 21 U/L (ref 0–44)
AST: 20 U/L (ref 15–41)
Albumin: 4 g/dL (ref 3.5–5.0)
Alkaline Phosphatase: 59 U/L (ref 38–126)
Anion gap: 5 (ref 5–15)
BUN: 19 mg/dL (ref 8–23)
CO2: 26 mmol/L (ref 22–32)
Calcium: 8.8 mg/dL — ABNORMAL LOW (ref 8.9–10.3)
Chloride: 104 mmol/L (ref 98–111)
Creatinine, Ser: 0.64 mg/dL (ref 0.44–1.00)
GFR, Estimated: >60 mL/min (ref >60)
Glucose, Bld: 129 mg/dL — ABNORMAL HIGH (ref 70–99)
Potassium: 3.4 mmol/L — ABNORMAL LOW (ref 3.5–5.1)
Sodium: 135 mmol/L (ref 135–145)
Total Bilirubin: 0.8 mg/dL (ref 0.3–1.2)
Total Protein: 7.1 g/dL (ref 6.5–8.1)

## 2020-09-27 LAB — CBC WITH DIFFERENTIAL/PLATELET
Abs Immature Granulocytes: 0.01 10*3/uL (ref 0.00–0.07)
Basophils Absolute: 0 10*3/uL (ref 0.0–0.1)
Basophils Relative: 1 %
Eosinophils Absolute: 0.2 10*3/uL (ref 0.0–0.5)
Eosinophils Relative: 4 %
HCT: 38.5 % (ref 36.0–46.0)
Hemoglobin: 12.4 g/dL (ref 12.0–15.0)
Immature Granulocytes: 0 %
Lymphocytes Relative: 25 %
Lymphs Abs: 1 10*3/uL (ref 0.7–4.0)
MCH: 28.5 pg (ref 26.0–34.0)
MCHC: 32.2 g/dL (ref 30.0–36.0)
MCV: 88.5 fL (ref 80.0–100.0)
Monocytes Absolute: 0.4 10*3/uL (ref 0.1–1.0)
Monocytes Relative: 9 %
Neutro Abs: 2.6 10*3/uL (ref 1.7–7.7)
Neutrophils Relative %: 61 %
Platelets: 199 10*3/uL (ref 150–400)
RBC: 4.35 MIL/uL (ref 3.87–5.11)
RDW: 13.2 % (ref 11.5–15.5)
WBC: 4.1 10*3/uL (ref 4.0–10.5)
nRBC: 0 % (ref 0.0–0.2)

## 2020-09-27 LAB — VITAMIN B12: Vitamin B-12: 485 pg/mL (ref 180–914)

## 2020-09-27 LAB — LACTATE DEHYDROGENASE: LDH: 136 U/L (ref 98–192)

## 2020-09-27 LAB — IRON AND TIBC
Iron: 64 ug/dL (ref 28–170)
Saturation Ratios: 17 % (ref 10.4–31.8)
TIBC: 385 ug/dL (ref 250–450)
UIBC: 321 ug/dL

## 2020-09-27 LAB — FERRITIN: Ferritin: 42 ng/mL (ref 11–307)

## 2020-09-27 LAB — VITAMIN D 25 HYDROXY (VIT D DEFICIENCY, FRACTURES): Vit D, 25-Hydroxy: 28.12 ng/mL — ABNORMAL LOW (ref 30–100)

## 2020-09-30 ENCOUNTER — Ambulatory Visit (HOSPITAL_COMMUNITY): Payer: Medicare Other | Admitting: Hematology

## 2020-11-15 DIAGNOSIS — H04123 Dry eye syndrome of bilateral lacrimal glands: Secondary | ICD-10-CM | POA: Diagnosis not present

## 2020-11-15 DIAGNOSIS — D3131 Benign neoplasm of right choroid: Secondary | ICD-10-CM | POA: Diagnosis not present

## 2020-11-15 DIAGNOSIS — E119 Type 2 diabetes mellitus without complications: Secondary | ICD-10-CM | POA: Diagnosis not present

## 2020-11-15 DIAGNOSIS — Z961 Presence of intraocular lens: Secondary | ICD-10-CM | POA: Diagnosis not present

## 2020-11-15 DIAGNOSIS — H43813 Vitreous degeneration, bilateral: Secondary | ICD-10-CM | POA: Diagnosis not present

## 2020-11-15 DIAGNOSIS — H401132 Primary open-angle glaucoma, bilateral, moderate stage: Secondary | ICD-10-CM | POA: Diagnosis not present

## 2020-11-22 NOTE — Progress Notes (Signed)
Chief Complaint  Patient presents with   Follow-up    Rm 10, alone. Here to f/u for leg and foot pn. Pt continues to have pn. States her memory has worsened as well.       HISTORY OF PRESENT ILLNESS:  11/23/20 ALL:  Debra Harris is a 70 y.o. female here today for follow up for right leg pain. NCS/EMG showed mild axonal sensorimotor, mainly sensory, polyneuropathy. She reports electrical pains continue in both legs to knees. A1C has been well managed. She is interested in medication to help ease pain. She does report chronic pain of right knee and considering total knee replacement in the future. She also feels that her memory is not as sharp as it used to be. She states that she could tell if the cashier had rang up her groceries wrong without having to calculate prior as she has always been very good at math. She continues to play Sodoku but admits that some puzzles are harder for her than they used to be. She is completely independent. No difficulty managing home, medications or finances. She drives without difficulty. No family history of dementia.    HISTORY (copied from Dr Dohmeier's previous note)  RV on 05-27-2020 , Debra Harris, wife of a retired Doctor, general practice,  with a new problem- a leg pain in ankle radiating up to right knee, shin splint pain, sometimes shoots to hip and groin area! Aching mostly but sometimes stabbing , sharp, severe, unbearable and unable to maintain sleep. She started a regimen of bike exercises, and feels this is still not improving her leg pain- she managed a 45 miles biking exercise total.  She began having these shooting pain at age 89- and it was considered an Fibromyalgia- overlap. Her leg now draws up - its not extended- toes pain from hallux radiating to mid aspect of the medial  foot. No temperature sensation.  She adjusted diet and took guaifenesin at high doses, which is mucinex generic.   She has no history of poor circulation, ankle edema is  present , though. Prediabetic. She has toe pain. GOUT ?  Her hip pain also can be radiating done, sciatic? But she strongly feels that foot and ankle produce the pain radiating ascending up.     REVIEW OF SYSTEMS: Out of a complete 14 system review of symptoms, the patient complains only of the following symptoms, bilateral leg and foot bail, memory loss and all other reviewed systems are negative.   ALLERGIES: Allergies  Allergen Reactions   Injectafer [Ferric Carboxymaltose]     Has tolerated feraheme without problems     HOME MEDICATIONS: Outpatient Medications Prior to Visit  Medication Sig Dispense Refill   acetaminophen (TYLENOL) 500 MG tablet Take 500 mg by mouth every 6 (six) hours as needed.     cetirizine (ZYRTEC) 10 MG tablet Take 10 mg by mouth daily.      LUMIGAN 0.01 % SOLN      Magnesium 400 MG TABS Take 1 tablet by mouth daily.     Multiple Vitamins-Minerals (MULTIVITAMIN ADULT PO) Take 1 tablet by mouth daily.     naproxen sodium (ALEVE) 220 MG tablet Take 220 mg by mouth.     NON FORMULARY CPAP at night      Wheat Dextrin (BENEFIBER) POWD Take by mouth daily.     timolol (BETIMOL) 0.5 % ophthalmic solution Betimol 0.5 % eye drops     No facility-administered medications prior to visit.  PAST MEDICAL HISTORY: Past Medical History:  Diagnosis Date   Adenomatous colon polyp    Allergic rhinitis    Arthritis    Back pain    Constipation    Diabetes mellitus    Diverticulosis    External hemorrhoids    Fibromyalgia    Gallbladder problem    GERD (gastroesophageal reflux disease)    Glaucoma    Glaucoma    Hemorrhoids    grade 3    Hip pain    IBS (irritable bowel syndrome)    IDA (iron deficiency anemia)    Iron deficiency anemia 06/05/2014   Irregular heartbeat    Leg edema    Leg numbness    Leg pain    Low tension glaucoma    Neuropathy, leg    Ovarian cyst 11/20/00   Prediabetes    Sleep apnea    CPAP   Sliding hiatal hernia       PAST SURGICAL HISTORY: Past Surgical History:  Procedure Laterality Date   CARPAL TUNNEL RELEASE Bilateral    CATARACT EXTRACTION     bilateral   CHOLECYSTECTOMY     COLONOSCOPY  2005   Small external hemorrhoids, single sigmoid colon diverticulum, small polyp at the hepatic flexure focally adenomatous.   COLONOSCOPY  2010   Dr. Gala Romney, Versed 4/Demerol 75. Pancolonic diverticula, external hemorrhoids. Colonoscopy every 5 years for history of adenomatous polyps   COLONOSCOPY  2014   Dr. Olevia Perches: Propofol. mild diverticulosis in the sigmoid colon, random colon biopsies benign.   COLONOSCOPY WITH PROPOFOL N/A 09/13/2015   Procedure: COLONOSCOPY WITH PROPOFOL;  Surgeon: Daneil Dolin, MD;  [facility];  Service: Endoscopy;  Laterality: N/A;  1030   ESOPHAGEAL DILATION N/A 10/26/2014   Procedure: ESOPHAGEAL DILATION [place];  Surgeon: Daneil Dolin, MD;  Location: [facility];  Service: Endoscopy;  Laterality: N/A;   ESOPHAGOGASTRODUODENOSCOPY  2010   Dr. Gala Romney, distal esophageal erosions, Venia Minks dilator passed. Comment small hiatal hernia, patulous EG junction.   ESOPHAGOGASTRODUODENOSCOPY (EGD) WITH PROPOFOL N/A 10/26/2014   Dr.Rourk- distal esophageal erosions c/w mild erosive reflux esophagitis. hiatal hernia, o/w normal EGD   ESOPHAGOGASTRODUODENOSCOPY (EGD) WITH PROPOFOL N/A 09/13/2015   Procedure: ESOPHAGOGASTRODUODENOSCOPY (EGD) WITH PROPOFOL;  Surgeon: Daneil Dolin, MD;  [facility];  Service: Endoscopy;  Laterality: N/A;   GIVENS CAPSULE STUDY N/A 10/26/2014   couple of mucosal breaks/erosions but essentially unremarkable small bowel study   HEMORRHOID BANDING     KNEE SURGERY     right-arthroscopy   OOPHORECTOMY  age 22s   PARTIAL HYSTERECTOMY  age 33s   RECTAL EXAM UNDER ANESTHESIA N/A 10/26/2014   circumferential grade 3 hemoorrhoids easily reducible   thumb surger Bilateral    trigger thumb     FAMILY HISTORY: Family History  Problem Relation  Age of Onset   Ulcers Mother    Glaucoma Mother    Heart disease Mother    Diabetes Mother    Depression Mother    Sudden death Mother    Obesity Mother    Glaucoma Maternal Grandmother    Heart disease Maternal Grandmother    Heart disease Maternal Grandfather    Colon cancer Neg Hx      SOCIAL HISTORY: Social History   Socioeconomic History   Marital status: Married    Spouse name: J. Orland Dec, MD   Number of children: 3   Years of education: Not on file   Highest education level: Not on  file  Occupational History   Occupation: AUDIOLOGIST, retired  Tobacco Use   Smoking status: Never   Smokeless tobacco: Never   Tobacco comments:    Pt reports tried once in 7th grade//ms  Vaping Use   Vaping Use: Never used  Substance and Sexual Activity   Alcohol use: Not Currently    Alcohol/week: 1.0 standard drink    Types: 1 Glasses of wine per week    Comment: rare   Drug use: No   Sexual activity: Never    Birth control/protection: None  Other Topics Concern   Not on file  Social History Narrative   3 children & married to orthopedist. 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   Social Determinants of Health   Financial Resource Strain: Not on file  Food Insecurity: Not on file  Transportation Needs: Not on file  Physical Activity: Inactive   Days of Exercise per Week: 0 days   Minutes of Exercise per Session: 0 min  Stress: Not on file  Social Connections: Not on file  Intimate Partner Violence: Not At Risk   Fear of Current or Ex-Partner: No   Emotionally Abused: No   Physically Abused: No   Sexually Abused: No     PHYSICAL EXAM  Vitals:   11/23/20 0905  BP: 115/64  Pulse: 64  Weight: 198 lb 8 oz (90 kg)  Height: 5\' 4"  (1.626 m)   Body mass index is 34.07 kg/m.  Generalized: Well developed, in no acute distress  Cardiology: normal rate and rhythm, no murmur auscultated  Respiratory: clear to auscultation bilaterally     Neurological examination  Mentation: Alert oriented to time, place, history taking. Follows all commands speech and language fluent Cranial nerve II-XII: Pupils were equal round reactive to light. Extraocular movements were full, visual field were full on confrontational test. Facial sensation and strength were normal.  Head turning and shoulder shrug  were normal and symmetric. Motor: The motor testing reveals 5 over 5 strength of all 4 extremities. Good symmetric motor tone is noted throughout.  Sensory: Sensory testing is intact to soft touch on all 4 extremities. No evidence of extinction is noted.  Coordination: Cerebellar testing reveals good finger-nose-finger and heel-to-shin bilaterally.  Gait and station: Gait is normal.    DIAGNOSTIC DATA (LABS, IMAGING, TESTING) - I reviewed patient records, labs, notes, testing and imaging myself where available.  Lab Results  Component Value Date   WBC 4.1 09/27/2020   HGB 12.4 09/27/2020   HCT 38.5 09/27/2020   MCV 88.5 09/27/2020   PLT 199 09/27/2020      Component Value Date/Time   NA 135 09/27/2020 1515   NA 144 03/19/2018 0000   K 3.4 (L) 09/27/2020 1515   CL 104 09/27/2020 1515   CO2 26 09/27/2020 1515   GLUCOSE 129 (H) 09/27/2020 1515   BUN 19 09/27/2020 1515   BUN 24 (A) 03/19/2018 0000   CREATININE 0.64 09/27/2020 1515   CALCIUM 8.8 (L) 09/27/2020 1515   PROT 7.1 09/27/2020 1515   PROT 7.0 01/08/2018 1555   ALBUMIN 4.0 09/27/2020 1515   ALBUMIN 4.2 01/08/2018 1555   AST 20 09/27/2020 1515   ALT 21 09/27/2020 1515   ALKPHOS 59 09/27/2020 1515   BILITOT 0.8 09/27/2020 1515   BILITOT 0.8 01/08/2018 1555   GFRNONAA >60 09/27/2020 1515   GFRAA >60 10/09/2019 1415   Lab Results  Component Value Date   CHOL 162 03/19/2018   HDL 46 03/19/2018  [place] 102 03/19/2018   TRIG 70 03/19/2018   Lab Results  Component Value Date   HGBA1C 5.7 03/19/2018   Lab Results  Component Value Date   VITAMINB12 485  09/27/2020   Lab Results  Component Value Date   TSH 0.718 07/25/2017    MMSE - Mini Mental State Exam 11/23/2020  Orientation to time 5  Orientation to Place 5  Registration 3  Attention/ Calculation 5  Recall 3  Language- name 2 objects 2  Language- repeat 1  Language- follow 3 step command 3  Language- read & follow direction 1  Write a sentence 1  Copy design 1  Total score 30     Montreal Cognitive Assessment  05/05/2019  Visuospatial/ Executive (0/5) 4  Naming (0/3) 3  Attention: Read list of digits (0/2) 2  Attention: Read list of letters (0/1) 1  Attention: Serial 7 subtraction starting at 100 (0/3) 3  Language: Repeat phrase (0/2) 2  Language : Fluency (0/1) 1  Abstraction (0/2) 2  Delayed Recall (0/5) 4  Orientation (0/6) 5  Total 27     ASSESSMENT AND PLAN  70 y.o. year old female  has a past medical history of Adenomatous colon polyp, Allergic rhinitis, Arthritis, Back pain, Constipation, Diabetes mellitus, Diverticulosis, External hemorrhoids, Fibromyalgia, Gallbladder problem, GERD (gastroesophageal reflux disease), Glaucoma, Glaucoma, Hemorrhoids, Hip pain, IBS (irritable bowel syndrome), IDA (iron deficiency anemia), Iron deficiency anemia (06/05/2014), Irregular heartbeat, Leg edema, Leg numbness, Leg pain, Low tension glaucoma, Neuropathy, leg, Ovarian cyst (11/20/00), Prediabetes, Sleep apnea, and Sliding hiatal hernia. here with    Polyneuropathy  Subjective memory complaints  Debra Harris does continue to have daily neuropathy pain. We have discussed options for pain management including alpha lipoic acid, gabapentin and duloxetine. I have provided her with educational material to review as she wishes to research options. MMSE 30/30. I do not suspect neurodegenerative memory loss but have offered to send her for formal cognitive evaluation if she wishes. Memory compensation strategies reviewed. She will continue healthy lifestyle habits. She will follow up in 6  months, sooner if needed.    No orders of the defined types were placed in this encounter.    No orders of the defined types were placed in this encounter.     Debbora Presto, MSN, FNP-C 11/23/2020, 12:48 PM  [facility] [address] [place], [place] [address] [phone]

## 2020-11-23 ENCOUNTER — Other Ambulatory Visit: Payer: Self-pay

## 2020-11-23 ENCOUNTER — Ambulatory Visit (INDEPENDENT_AMBULATORY_CARE_PROVIDER_SITE_OTHER): Payer: Medicare Other | Admitting: Family Medicine

## 2020-11-23 ENCOUNTER — Encounter: Payer: Self-pay | Admitting: Family Medicine

## 2020-11-23 VITALS — BP 115/64 | HR 64 | Ht 64.0 in | Wt 198.5 lb

## 2020-11-23 DIAGNOSIS — R4189 Other symptoms and signs involving cognitive functions and awareness: Secondary | ICD-10-CM | POA: Diagnosis not present

## 2020-11-23 DIAGNOSIS — G629 Polyneuropathy, unspecified: Secondary | ICD-10-CM

## 2020-11-23 NOTE — Patient Instructions (Signed)
Below is our plan:  We will continue to monitor symptoms. Consider alpha lipoic acid over the counter. We could also consider gabapentin 100mg  three times daily if you wish. Consider referral to neuropsychology for formal neurocognitive testing.   Other therapies Possibly effective ?Alpha-lipoic acid (ALA) - For patients who are refractory to or intolerant of first-line pharmacotherapies and interested in a nutritional supplement approach, we suggest a trial of oral ALA (600 mg daily). ALA is an antioxidant with the potential to diminish oxidative stress, improve the underlying pathophysiology of neuropathy, and reduce pain. (See "Pathogenesis of diabetic polyneuropathy".) Several prospective, placebo-controlled trials have examined ALA (either intravenous or oral) in patients with painful diabetic neuropathy [61-64]. In an initial trial, daily intravenous ALA for three weeks was associated with reduced pain, paresthesia, and numbness compared with placebo infusions [62]. In a second trial, oral ALA (600, 1200, or 1800 mg daily) versus placebo was studied in 181 patients with diabetes and symptomatic distal symmetric polyneuropathy (DSPN) [64]. All three doses of oral ALA treatment were associated with a reduction in the neuropathy total symptom score (a summation of stabbing pain, burning pain, paresthesia, and asleep numbness) compared with placebo. The benefit of ALA did not differ by dose. A ?50 percent reduction in neuropathic symptoms was observed in 50 to 62 percent of patients treated with ALA versus 26 percent with placebo. Doses higher than 600 mg daily were limited by increasing adverse events (nausea, vomiting, and vertigo) without increasing efficacy. Confidence in the findings is limited by the short duration of this trial. There are no long-term studies that assess the effect of ALA on progression of neuropathy. ?Valproic acid - Valproate (500 to 1200 mg daily) was effective for reducing pain  in diabetic neuropathy in two small placebo-controlled trials from a single center [65,66]. Larger confirmatory studies have not been performed, and the gabapentinoids are generally safer and require less monitoring. Valproate should not be used in patients with liver disease. It has well-recognized teratogenic effects and should be avoided in females of childbearing age. ?Carbamazepine - Carbamazepine has a potential role in patients who have failed standard options based on its efficacy in the treatment of trigeminal neuralgia as well as historical reports in patients with diabetic neuropathy [67,68]. Contemporary data are lacking, however. Not recommended ?Opioids - We avoid the use of opioids for the treatment of painful diabetic neuropathy because of the lack of evidence regarding long-term effectiveness and because of the potential for tolerance, addiction, and overdose. We acknowledge that tapentadol has an FDA indication for diabetic neuropathy and that the 2017 American Diabetes Association (ADA) guidelines state that tramadol or tapentadol may be used as the "last-line" therapy prior to referral to a pain clinic [1]. However, a more recent systematic review on generalized treatment of pain suggests that the use of chronic opioid therapy has limited benefit and carries clear medical risk [69]. This newer statement is supported by an earlier 2009 systematic review of opioids for chronic noncancer pain, which found a paucity of evidence regarding long-term effectiveness and risks of such treatment, including the potential for opioid abuse, addiction, and overdose [70]. Similarly, a 2013 systematic review noted that the available randomized controlled trials of opioids for neuropathic pain did not clearly address the issues of abuse and addiction [71]. In a cohort study of over 9900 patients prescribed long-term opioid therapy for nonmalignant pain, the use of higher-dose regimens was associated with an  increased risk of opioid overdose [72]. Because of these issues, we  do not use opioids for treatment of painful diabetic neuropathy. A referral to a pain clinic is suggested for patients whose pain is unresponsive to conventional therapies. ?Topiramate - A systematic review that analyzed data from three randomized trials concluded that topiramate is not effective for painful diabetic polyneuropathy [73].  Please make sure you are staying well hydrated. I recommend 50-60 ounces daily. Well balanced diet and regular exercise encouraged. Consistent sleep schedule with 6-8 hours recommended.   Please continue follow up with care team as directed.   Follow up with Dr Brett Fairy in 6 months   You may receive a survey regarding today's visit. I encourage you to leave honest feed back as I do use this information to improve patient care. Thank you for seeing me today!

## 2020-12-01 DIAGNOSIS — Z23 Encounter for immunization: Secondary | ICD-10-CM | POA: Diagnosis not present

## 2020-12-02 ENCOUNTER — Other Ambulatory Visit: Payer: Self-pay | Admitting: Family Medicine

## 2020-12-02 ENCOUNTER — Encounter: Payer: Self-pay | Admitting: Family Medicine

## 2020-12-02 MED ORDER — CYCLOBENZAPRINE HCL 5 MG PO TABS: 5.0000 mg | ORAL_TABLET | Freq: Three times a day (TID) | ORAL | 3 refills | Status: DC | PRN

## 2020-12-11 DIAGNOSIS — Z20822 Contact with and (suspected) exposure to covid-19: Secondary | ICD-10-CM | POA: Diagnosis not present

## 2020-12-13 ENCOUNTER — Ambulatory Visit: Payer: Medicare Other | Admitting: Family Medicine

## 2021-01-17 DIAGNOSIS — J019 Acute sinusitis, unspecified: Secondary | ICD-10-CM | POA: Diagnosis not present

## 2021-02-10 DIAGNOSIS — Z20828 Contact with and (suspected) exposure to other viral communicable diseases: Secondary | ICD-10-CM | POA: Diagnosis not present

## 2021-02-25 DIAGNOSIS — Z20822 Contact with and (suspected) exposure to covid-19: Secondary | ICD-10-CM | POA: Diagnosis not present

## 2021-03-04 ENCOUNTER — Encounter (HOSPITAL_COMMUNITY): Payer: Self-pay | Admitting: Physical Therapy

## 2021-03-04 NOTE — Therapy (Signed)
[facility] [place], [name], [address] Phone: [phone]   Fax:  [phone]  Patient Details  Name: Debra Harris MRN: [id number] Date of Birth: 06-12-1950 Referring Provider:  No ref. provider found  Encounter Date: 03/04/2021  PHYSICAL THERAPY DISCHARGE SUMMARY  Visits from Start of Care: 2  Current functional level related to goals / functional outcomes: NA   Remaining deficits: NA   Education / Equipment: Patient DC per change in medical status   Patient agrees to discharge. Patient goals were not met. Patient is being discharged due to a change in medical status.  8:58 AM, 03/04/21 Josue Hector PT DPT  Physical Therapist with [facility]  [phone] [place] [address] [place], [name], [address] Phone: [phone]   Fax:  [phone]

## 2021-03-13 IMAGING — CT CT HEAD W/O CM
3 series · 15 of 47 positions shown, 18 images · non-contrast
Comparison: 09/17/2008

CLINICAL DATA: Head trauma and headache.  Accident 2 days ago.

EXAM:
CT HEAD WITHOUT CONTRAST
TECHNIQUE: Contiguous axial images were obtained from the base of the skull
through the vertex without intravenous contrast.

[Series 2: head w o · axial · 0.47mm/px · z∈[+1480,+1610]mm · 9 of 32 slices shown, 12 images]
[im 3/32  brain]
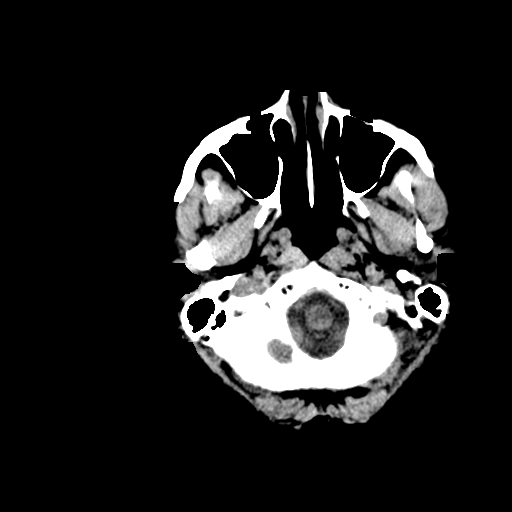
[im 3/32  bone]
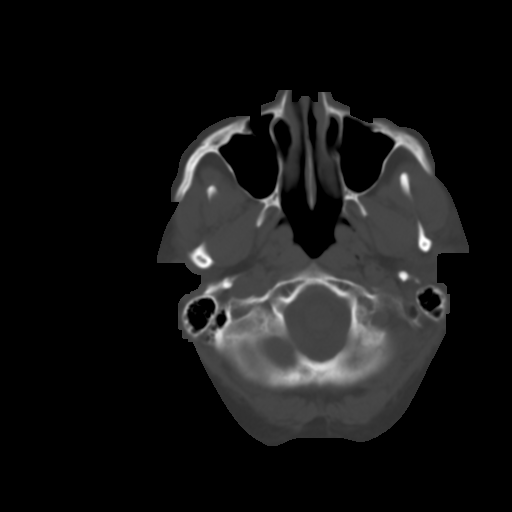
[im 6/32  brain]
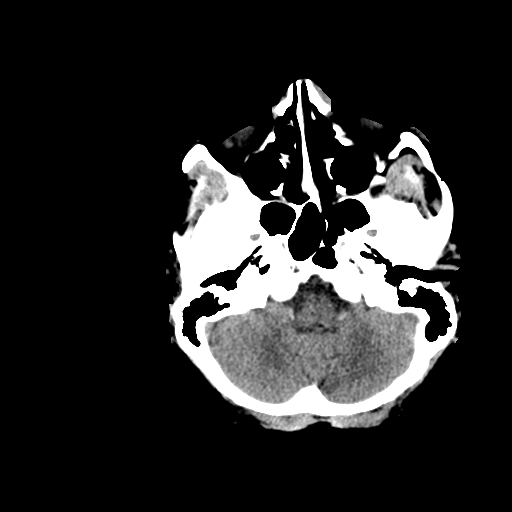
[im 9/32  brain]
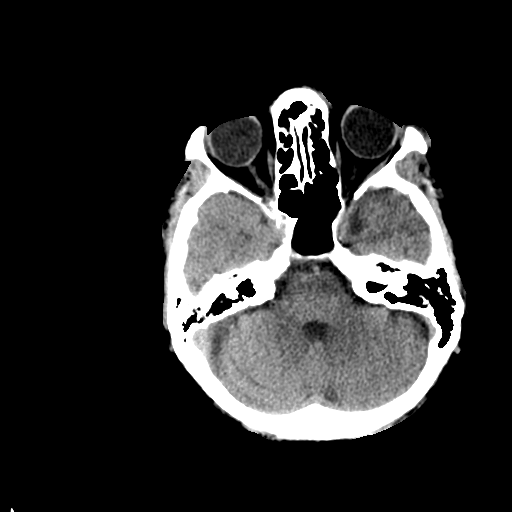
[im 12/32  brain]
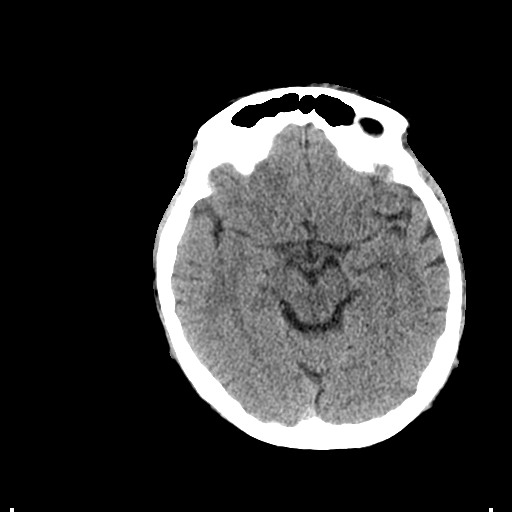
[im 17/32  brain]
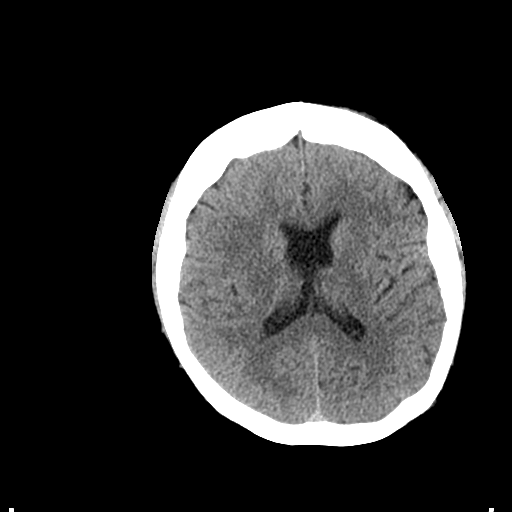
[im 17/32  bone]
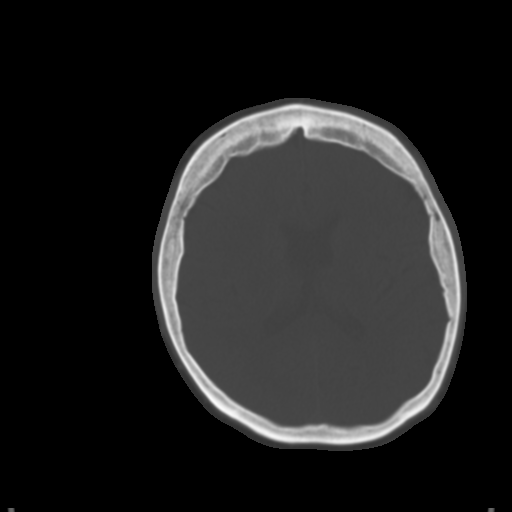
[im 20/32  brain]
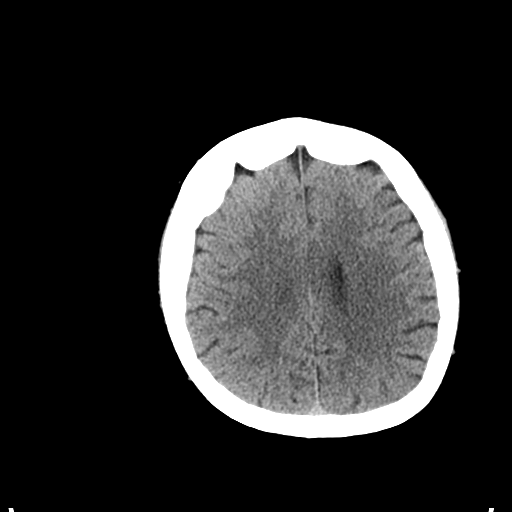
[im 23/32  brain]
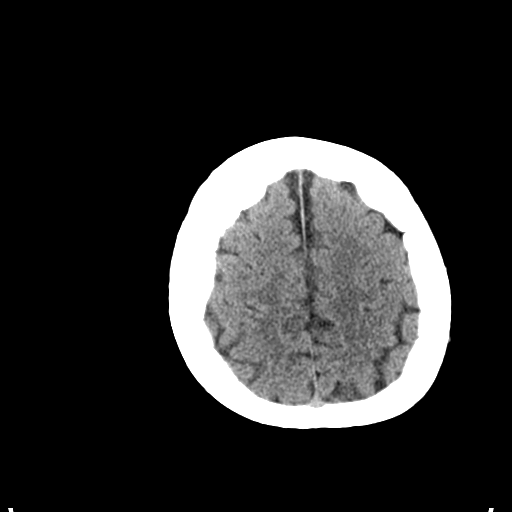
[im 26/32  brain]
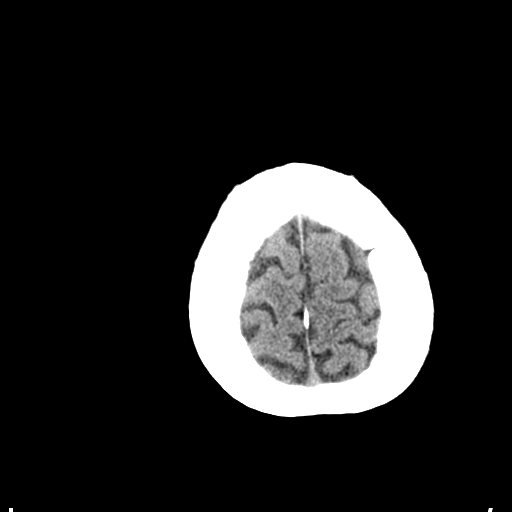
[im 29/32  brain]
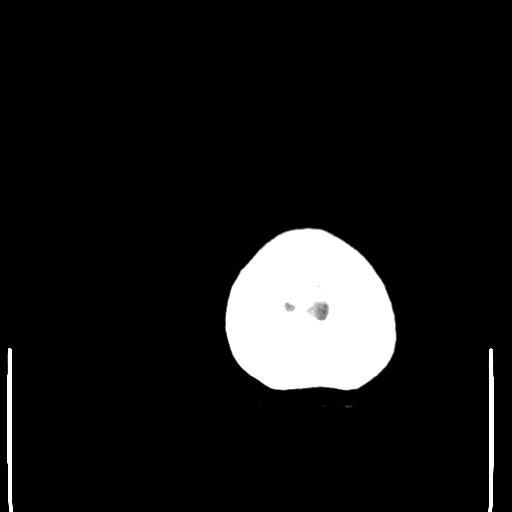
[im 29/32  bone]
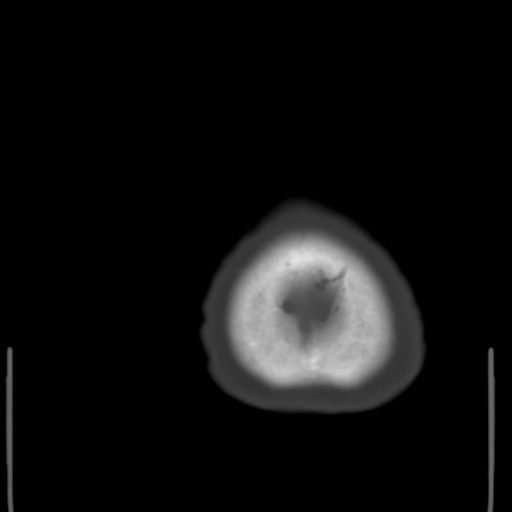

[Series 4: coronal soft · coronal · 0.29mm/px · 3 of 63 slices shown]
[im 21/63  brain]
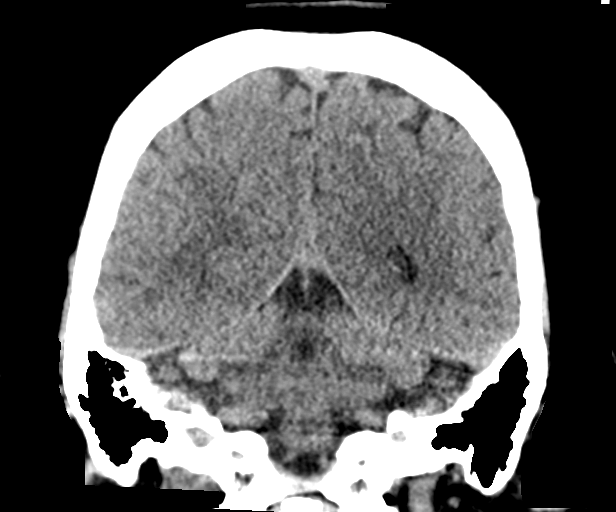
[im 28/63  brain]
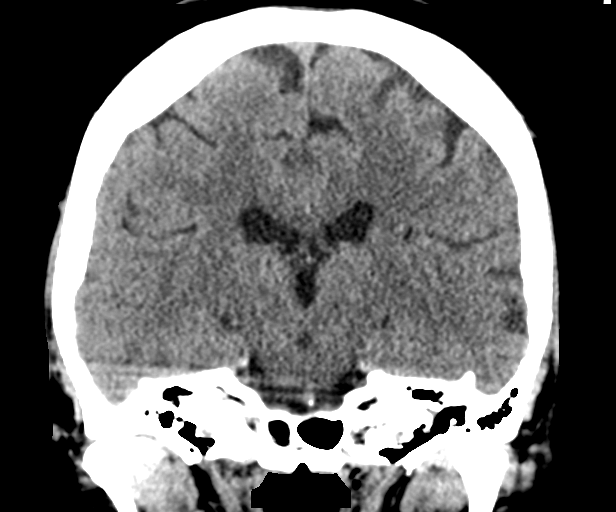
[im 35/63  brain]
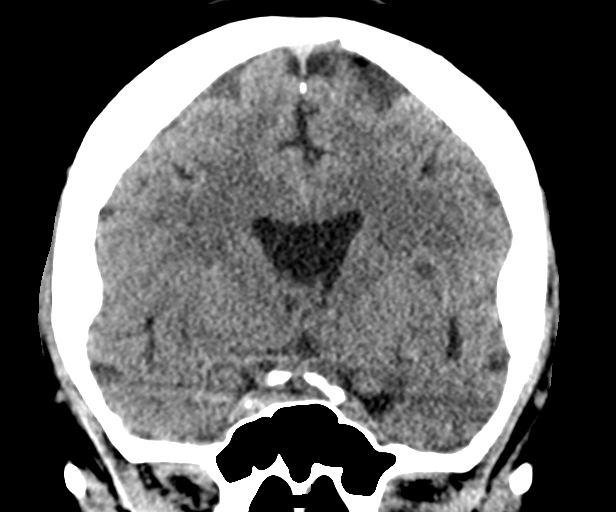

[Series 5: sagittal soft · sagittal · 0.29mm/px · 3 of 60 slices shown]
[im 20/60  brain]
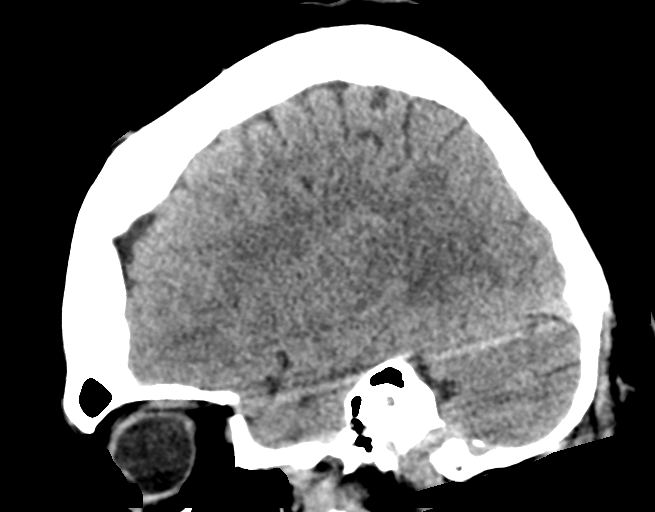
[im 30/60  brain]
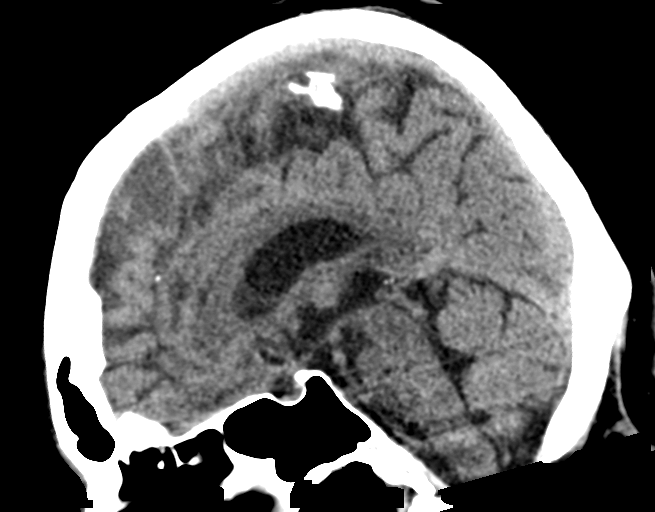
[im 40/60  brain]
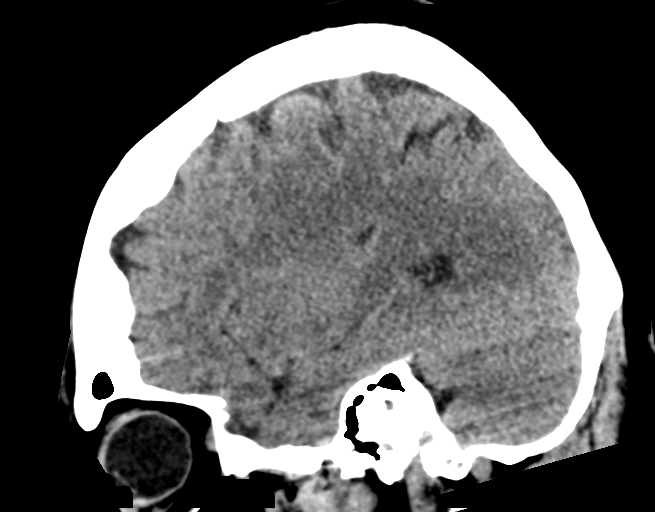

[15 of 47 positions shown; findings below may reference images not displayed]

FINDINGS: Brain: No evidence of acute infarction, hemorrhage, hydrocephalus,
extra-axial collection or mass lesion/mass effect.

Vascular: Atherosclerotic calcification, including at the mid
basilar.

Skull: Forehead contusion without opaque foreign body or fracture.

Sinuses/Orbits: Negative for injury.  Bilateral cataract resection
IMPRESSION: 1. No evidence of intracranial injury.
2. Forehead contusion without fracture.

## 2021-03-21 DIAGNOSIS — H04123 Dry eye syndrome of bilateral lacrimal glands: Secondary | ICD-10-CM | POA: Diagnosis not present

## 2021-03-21 DIAGNOSIS — H401132 Primary open-angle glaucoma, bilateral, moderate stage: Secondary | ICD-10-CM | POA: Diagnosis not present

## 2021-03-21 DIAGNOSIS — H43813 Vitreous degeneration, bilateral: Secondary | ICD-10-CM | POA: Diagnosis not present

## 2021-03-21 DIAGNOSIS — D3131 Benign neoplasm of right choroid: Secondary | ICD-10-CM | POA: Diagnosis not present

## 2021-03-21 DIAGNOSIS — E119 Type 2 diabetes mellitus without complications: Secondary | ICD-10-CM | POA: Diagnosis not present

## 2021-03-21 DIAGNOSIS — Z961 Presence of intraocular lens: Secondary | ICD-10-CM | POA: Diagnosis not present

## 2021-03-29 ENCOUNTER — Ambulatory Visit (INDEPENDENT_AMBULATORY_CARE_PROVIDER_SITE_OTHER): Payer: Medicare Other | Admitting: Pulmonary Disease

## 2021-03-29 ENCOUNTER — Other Ambulatory Visit: Payer: Self-pay

## 2021-03-29 ENCOUNTER — Encounter: Payer: Self-pay | Admitting: Pulmonary Disease

## 2021-03-29 VITALS — BP 110/60 | HR 80 | Temp 98.0°F | Ht 63.0 in | Wt 207.0 lb

## 2021-03-29 DIAGNOSIS — G4733 Obstructive sleep apnea (adult) (pediatric): Secondary | ICD-10-CM

## 2021-03-29 NOTE — Patient Instructions (Signed)
Will arrange for replacement Resmed CPAP machine and for CPAP mask refitting with [facility]  Follow up in 5 months

## 2021-03-29 NOTE — Progress Notes (Signed)
Pulmonary, Critical Care, and Sleep Medicine  Chief Complaint  Patient presents with   Follow-up    Cpap compliance, needs to new mask going to [facility] to get fitted, would like a Rx just in case     Past Surgical History:  She  has a past surgical history that includes Partial hysterectomy (age 59s); Oophorectomy (age 39s); Cholecystectomy; Carpal tunnel release (Bilateral); thumb surger (Bilateral); Knee surgery; Cataract extraction; Colonoscopy (2005); Colonoscopy (2010); Colonoscopy (2014); Esophagogastroduodenoscopy (2010); Esophagogastroduodenoscopy (egd) with propofol (N/A, 10/26/2014); Esophageal dilation (N/A, 10/26/2014); Givens capsule study (N/A, 10/26/2014); Rectal exam under anesthesia (N/A, 10/26/2014); Hemorrhoid banding; Colonoscopy with propofol (N/A, 09/13/2015); and Esophagogastroduodenoscopy (egd) with propofol (N/A, 09/13/2015).  Past Medical History:  Colon polyps, Allergies, OA, Back pain, DM, Diverticulosis, Fibromyalgia, GERD, Glaucoma, IBS, Iron deficiency anemia, Neuropathy, Hiatal hernia  Constitutional:  BP 110/60 (BP Location: Left Arm, Cuff Size: Large)    Pulse 80    Temp 98 F (36.7 C) (Oral)    Ht 5\' 3"  (1.6 m)    Wt 207 lb (93.9 kg)    SpO2 97%    BMI 36.67 kg/m   Brief Summary:  Debra Harris is a 71 y.o. female with obstructive sleep apnea.      Subjective:   She uses CPAP nightly.  Had dental work recently and know nasal mask is causing irritation over her upper lip and teeth.  Pressure setting okay.  She can sleep without CPAP.  Physical Exam:   Appearance - well kempt   ENMT - no sinus tenderness, no oral exudate, no LAN, Mallampati 3 airway, no stridor  Respiratory - equal breath sounds bilaterally, no wheezing or rales  CV - s1s2 regular rate and rhythm, no murmurs  Ext - no clubbing, no edema  Skin - no rashes  Psych - normal mood and affect   Sleep Tests:  PSG 11/25/10 >> AHI 68.6, SpO2 low 81% Auto CPAP 02/26/21 to  03/27/21 >> used on 21 of 30 nights with average 7 hrs 31 min.  Average AHI 0.5 with median CPAP 8 and 95 th percentile CPAP 11 cm H2O  Social History:  She  reports that she has never smoked. She has never used smokeless tobacco. She reports that she does not currently use alcohol after a past usage of about 1.0 standard drink per week. She reports that she does not use drugs.  Family History:  Her family history includes Depression in her mother; Diabetes in her mother; Glaucoma in her maternal grandmother and mother; Heart disease in her maternal grandfather, maternal grandmother, and mother; Obesity in her mother; Sudden death in her mother; Ulcers in her mother.     Assessment/Plan:   Obstructive sleep apnea. - she is compliant with therapy and reports benefit from CPAP - she uses [facility] as her DME - her current CPAP device is more than 71 yrs old - will arrange for new Resmed auto CPAP with range 5 to 12 cm H2O - will arrange for CPAP mask refitting.   Obesity. - discussed importance of weight loss  Time Spent Involved in Patient Care on Day of Examination:  26 minutes  Follow up:   Patient Instructions  Will arrange for replacement Resmed CPAP machine and for CPAP mask refitting with [facility]  Follow up in 5 months  Medication List:   Allergies as of 03/29/2021       Reactions   Injectafer [ferric Carboxymaltose]    Has tolerated feraheme without problems  Medication List        Accurate as of March 29, 2021  9:53 AM. If you have any questions, ask your nurse or doctor.          acetaminophen 500 MG tablet Commonly known as: TYLENOL Take 500 mg by mouth every 6 (six) hours as needed.   Benefiber Powd Take by mouth daily.   cetirizine 10 MG tablet Commonly known as: ZYRTEC Take 10 mg by mouth daily.   cyclobenzaprine 5 MG tablet Commonly known as: FLEXERIL Take 1 tablet (5 mg total) by mouth every 8 (eight) hours as needed for muscle  spasms.   Lumigan 0.01 % Soln Generic drug: bimatoprost   Magnesium 400 MG Tabs Take 1 tablet by mouth daily.   MULTIVITAMIN ADULT PO Take 1 tablet by mouth daily.   naproxen sodium 220 MG tablet Commonly known as: ALEVE Take 220 mg by mouth.   NON FORMULARY CPAP at night        Signature:  Chesley Mires, MD [place] Pager - [phone] 03/29/2021, 9:53 AM

## 2021-04-04 ENCOUNTER — Ambulatory Visit (INDEPENDENT_AMBULATORY_CARE_PROVIDER_SITE_OTHER): Payer: Medicare Other | Admitting: Orthopedic Surgery

## 2021-04-04 ENCOUNTER — Ambulatory Visit: Payer: Medicare Other

## 2021-04-04 ENCOUNTER — Encounter: Payer: Self-pay | Admitting: Orthopedic Surgery

## 2021-04-04 ENCOUNTER — Other Ambulatory Visit: Payer: Self-pay

## 2021-04-04 VITALS — BP 136/70 | HR 71 | Ht 63.0 in | Wt 200.0 lb

## 2021-04-04 DIAGNOSIS — M25551 Pain in right hip: Secondary | ICD-10-CM | POA: Diagnosis not present

## 2021-04-04 DIAGNOSIS — M545 Low back pain, unspecified: Secondary | ICD-10-CM

## 2021-04-04 DIAGNOSIS — M25511 Pain in right shoulder: Secondary | ICD-10-CM | POA: Diagnosis not present

## 2021-04-04 DIAGNOSIS — M5441 Lumbago with sciatica, right side: Secondary | ICD-10-CM

## 2021-04-04 MED ORDER — PREDNISONE 10 MG (48) PO TBPK: ORAL_TABLET | Freq: Every day | ORAL | 1 refills | Status: DC

## 2021-04-04 MED ORDER — TIZANIDINE HCL 4 MG PO TABS: 4.0000 mg | ORAL_TABLET | Freq: Every day | ORAL | 1 refills | Status: DC

## 2021-04-04 NOTE — Progress Notes (Addendum)
Chief Complaint  Patient presents with   Back Pain    Right sided buttock pain down right leg    HPI: Debra Harris is seeing [name] today for acute onset of lower back pain with radicular symptoms into the right hip leg and foot  She is followed for chronic osteoarthritis of the right knee.  She was seen by physical therapy in November 2021 for back pain  Last Tuesday or Wednesday she had to do some cleaning and then water pipe leaked and she had to clean out underneath her sink since that time she has had increasing pain which was extreme located in the lower back right hip right buttock right leg radiating to the right foot.  She took 1 hydrocodone but it did not help.  She is on some Naprosyn which seems to be helping along with ice and heat  Her pain was so severe she could not even put her socks on it was hard to walk she is doing a little bit better today.   Allergies  Allergen Reactions   Injectafer [Ferric Carboxymaltose]     Has tolerated feraheme without problems    Past Medical History:  Diagnosis Date   Adenomatous colon polyp    Allergic rhinitis    Arthritis    Back pain    Constipation    Diabetes mellitus    Diverticulosis    External hemorrhoids    Fibromyalgia    Gallbladder problem    GERD (gastroesophageal reflux disease)    Glaucoma    Glaucoma    Hemorrhoids    grade 3    Hip pain    IBS (irritable bowel syndrome)    IDA (iron deficiency anemia)    Iron deficiency anemia 06/05/2014   Irregular heartbeat    Leg edema    Leg numbness    Leg pain    Low tension glaucoma    Neuropathy, leg    Ovarian cyst 11/20/00   Prediabetes    Sleep apnea    CPAP   Sliding hiatal hernia    BP 136/70    Pulse 71    Ht 5\' 3"  (1.6 m)    Wt 200 lb (90.7 kg)    BMI 35.43 kg/m   Physical Exam Constitutional:      General: She is not in acute distress.    Appearance: She is well-developed.     Comments: Well developed, well nourished Normal grooming and hygiene      Cardiovascular:     Comments: No peripheral edema Skin:    General: Skin is warm and dry.  Neurological:     Mental Status: She is alert and oriented to person, place, and time.     Sensory: No sensory deficit.     Coordination: Coordination normal.     Gait: Gait normal.     Deep Tendon Reflexes: Reflexes are normal and symmetric.  Psychiatric:        Mood and Affect: Mood normal.        Behavior: Behavior normal.        Thought Content: Thought content normal.        Judgment: Judgment normal.     Comments: Affect normal     Musculoskeletal: Lumbar spine tenderness primarily midline and right nontender on the left there is central tenderness starting at about L3 working its way down to L5-S1.  There is some pain and tenderness in the right buttock however the range of motion in both  hips seems to be equal  She had excellent sensation to sharp touch bilaterally with good plantarflexion dorsiflexion strength in the right lower extremity.  She also had tenderness at the superior angle of the scapula on the medial side at the corner where she had had some trigger points in the past and seem to be having that again  She did have good passive range of motion of the right shoulder with no impingement signs  X-rays of the hips through her pelvic x-rays showed just minor degenerative changes at the bottom of the hip nothing in the sore seal area  The lumbar spine was compared to prior images taken on 3 separate occasions over the last 10 years and although she has degenerative arthritis in the facet joints the disc spaces are maintained there is some anterior lipping the lumbar lordosis seems to be somewhat increased   A/P  Encounter Diagnoses  Name Primary?   Lumbar pain    Pain in right hip    Acute right-sided low back pain with right-sided sciatica Yes   Trigger point of right shoulder region     Meds ordered this encounter  Medications   tiZANidine (ZANAFLEX) 4 MG  tablet    Sig: Take 1 tablet (4 mg total) by mouth daily.    Dispense:  30 tablet    Refill:  1   predniSONE (STERAPRED UNI-PAK 48 TAB) 10 MG (48) TBPK tablet    Sig: Take by mouth daily. 10 mg 12 days    Dispense:  48 tablet    Refill:  1    TRIGGER POINT INJECTION  Patient consented verbally for injection of the right posterior/MEDIAL scapula. Timeout confirmed the site of injection A steroid injection was performed at inferior border of the right  scapula at the point of maximal tenderness using 1% plain Lidocaine and 40 mg of Depo-Medrol. This was well tolerated.

## 2021-04-04 NOTE — Patient Instructions (Signed)
Rest  No lifting   Heat as needed

## 2021-04-13 ENCOUNTER — Ambulatory Visit (INDEPENDENT_AMBULATORY_CARE_PROVIDER_SITE_OTHER): Payer: Medicare Other | Admitting: Orthopedic Surgery

## 2021-04-13 ENCOUNTER — Other Ambulatory Visit: Payer: Self-pay

## 2021-04-13 ENCOUNTER — Encounter: Payer: Self-pay | Admitting: Orthopedic Surgery

## 2021-04-13 DIAGNOSIS — G8929 Other chronic pain: Secondary | ICD-10-CM

## 2021-04-13 DIAGNOSIS — M545 Low back pain, unspecified: Secondary | ICD-10-CM | POA: Diagnosis not present

## 2021-04-13 DIAGNOSIS — M25511 Pain in right shoulder: Secondary | ICD-10-CM

## 2021-04-13 DIAGNOSIS — M25561 Pain in right knee: Secondary | ICD-10-CM | POA: Diagnosis not present

## 2021-04-13 DIAGNOSIS — M1710 Unilateral primary osteoarthritis, unspecified knee: Secondary | ICD-10-CM

## 2021-04-13 MED ORDER — HYDROCODONE-ACETAMINOPHEN 5-325 MG PO TABS: 1.0000 | ORAL_TABLET | Freq: Four times a day (QID) | ORAL | 0 refills | Status: DC | PRN

## 2021-04-13 NOTE — Progress Notes (Signed)
Follow-up visit  Encounter Diagnoses  Name Primary?   Lumbar pain Yes   Trigger point of right shoulder region     Chief Complaint  Patient presents with   Back Pain    Better with the dosepack     Debra Harris has improved with the steroid Dosepak and the tizanidine  She still has some tenderness in her back but says she is comfortable doing the exercises and taking medication on an as-needed basis  Her trigger point injection did help significantly and she is on self-directed therapy which she has used in the past for the trigger point  We discussed continue with home exercises and tizanidine  She is also going on a trip and would like some hydrocodone which she takes judiciously and has not had any issues in terms of use or abuse  Encounter Diagnoses  Name Primary?   Lumbar pain Yes   Trigger point of right shoulder region    Chronic pain of right knee    Primary localized osteoarthritis of knee    Meds ordered this encounter  Medications   HYDROcodone-acetaminophen (NORCO/VICODIN) 5-325 MG tablet    Sig: Take 1 tablet by mouth every 6 (six) hours as needed for moderate pain.    Dispense:  30 tablet    Refill:  0

## 2021-05-24 ENCOUNTER — Other Ambulatory Visit: Payer: Self-pay

## 2021-05-24 ENCOUNTER — Other Ambulatory Visit: Payer: Self-pay | Admitting: *Deleted

## 2021-05-24 ENCOUNTER — Encounter: Payer: Self-pay | Admitting: Neurology

## 2021-05-24 ENCOUNTER — Ambulatory Visit (INDEPENDENT_AMBULATORY_CARE_PROVIDER_SITE_OTHER): Payer: Medicare Other | Admitting: Neurology

## 2021-05-24 VITALS — BP 138/82 | HR 73 | Ht 63.0 in | Wt 207.0 lb

## 2021-05-24 DIAGNOSIS — G629 Polyneuropathy, unspecified: Secondary | ICD-10-CM

## 2021-05-24 DIAGNOSIS — M5431 Sciatica, right side: Secondary | ICD-10-CM | POA: Diagnosis not present

## 2021-05-24 DIAGNOSIS — H532 Diplopia: Secondary | ICD-10-CM | POA: Diagnosis not present

## 2021-05-24 DIAGNOSIS — M5432 Sciatica, left side: Secondary | ICD-10-CM

## 2021-05-24 NOTE — Progress Notes (Signed)
? ? ? ?Provider:  Larey Seat, M D  ?Referring Provider: Asencion Noble, MD ?Primary Care Physician:  Asencion Noble, MD ? ?Chief Complaint  ?Patient presents with  ? Follow-up  ?  Rm 11, alone. Here for 6 month f/u for memory and polyneuropathy. Pt continues to get confused. Having HA, pn r back side of head. Will likely be start taking an antidepressant, feels like shes having an anxiety attacks.   ? 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   ?Would like memory addressed, Neuropathy is not progressing. Reports diplopia with onset at odd times, not meaning to be fatigued. Horizontal diplopia, weakness of abduction on the left eye- she wears different contact lenses, far and near , and needs reading glasses.  ? ? ? ?RV on 05-27-2020 , Debra Harris, wife of a retired Doctor, general practice,  with a new problem- a leg pain in ankle radiating up to right knee, shin splint pain, sometimes shoots to hip and groin area! Aching mostly but sometimes stabbing , sharp, severe, unbearable and unable to maintain sleep. ?She started a regimen of bike exercises, and feels this is still not improving her leg pain- she managed a 45 miles biking exercise total.  She began having these shooting pain at age 28- and it was considered an Fibromyalgia- overlap. Her leg now draws up - its not extended- toes pain from hallux radiating to mid aspect of the medial  foot. No temperature sensation.  ?She adjusted diet and took guaifenesin at high doses, which is mucinex generic.   ?She has no history of poor circulation, ankle edema is present , though. Prediabetic.  ?She has toe pain. GOUT ?  Her hip pain also can be radiating done, sciatic? ?But she strongly feels that foot and ankle produce the pain radiating ascending up.  ? ? ? ?RV 05-05-2019: ?I have the pleasure of seeing Debra Harris ?eeling today in a revisit, the patient had been suffering a  concussion probably without loss of consciousness in November 2020.  She is concerned about cognitive difficulties that arose after the concussion.  She did not have as much of a short-term memory loss but more confusion and word finding difficulties.  She also reports that her sleep has been "horrible".  She underwent an MRI of the brain but has ordered with and without contrast and that showed no abnormalities, mild microvascular chronic changes were noted and these are typical for age.  We did not see atrophy, scars inflammation or infiltration. ? ?Reviewed image in the presence of patient.  ?In the meantime she has taken Valproate for headaches, prescribed by Dr. Leonie Man on all. This has reduced the headaches and is not (yet) inducing any increase in appetite. We are weaning off. She has gained 30 pounds since the concussion.(!).  She has been prediabetic and needs to lose weight -- not gain. MOCA is in normal range, but patient feels subjectively that she is doing not well with memory.  ? ?Her PT sessions were d/c after her exercises hurt her knees. The balance exercises helped balance - but hurt the knee joints.  ? ? ?  05/05/2019  ?  9:20 AM  ?Montreal Cognitive Assessment   ?Visuospatial/ Executive (0/5) 4  ?Naming (0/3) 3  ?Attention: Read list of digits (0/2) 2  ?Attention: Read list of letters (0/1) 1  ?Attention: Serial 7 subtraction starting  at 100 (0/3) 3  ?Language: Repeat phrase (0/2) 2  ?Language : Fluency (0/1) 1  ?Abstraction (0/2) 2  ?Delayed Recall (0/5) 4  ?Orientation (0/6) 5  ?Total 27  ? ? ? ?HPI:  Debra Harris is a 71 y.o. female and was seen upon a referral from Dr. Willey Blade for a headache evaluation on 03-07-2019. 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  Her daughter works in a Astron[employer] her husband is a part time orthopedist at this time. ?On November 16th she injured herself with a shuffle trying to remove a plant which snapped back was digging. ?She immediately developed a big  goose egg over her left eye and eyebrow and jokingly stated that she looked like a clean from [employer].  2 days later a hematoma had descended into the left cheekbone and perioral area.  It was then that her daughter made her see another physician who then ordered a CT of the brain which reportedly came back negative.  However her symptoms did not get better as the external injuries resolved.  She states that she now still feels some dizziness- a spinning sensation of vertigo and she indicates a clockwise rotation. ?She is a CPAP user- and she noted days before the injury that she can get lightheaded when she turns to sleep on the right.  ?No memory issues, but more confusion and wordfinding. Sleep has been horrible for while, she did well until mid- pandemic, but has become more unsettled , mind is racing at night. More over the last 3 month.  ?The vertigo didn't help. ?12/31/ 2020 she fell down outdoors, on a staircase. She had looked to the side, caught a vertigo suddenly lost her footing, her balance and she fell backwards. New onset headaches. ?No bony injuries.  ? ? ?Social history - not a smoker, not a drinker= are cocktail, one at a time. ?She adheres to a high guaifenesin diet.  ? ? ?Review of Systems: ?Out of a complete 14 system review, the patient complains of only the following symptoms, and all other reviewed systems are negative. ? see above ? ?fibromyalgia in the past - now presenting with leg pain, aggravating , shooting, stabbing pain.  ? ?Diplopia vertigo, insomnia, and memory changes-Postconcussion. Has improved. Concussion does  explain Vertigo, postraumatic, positional dependent.  This I has resolved.  ?Memory dysfunction  Is not objectively present- but subjectively present. MOCA 27/30  ?Headaches had been migrainous , helped by Depakote PO, now gaining weight. Has to change to a differenet preventive .  Her current headaches are SINUS related. ? ? ? ?Social History  ? ?Socioeconomic History   ? Marital status: Married  ?  Spouse name: J. Orland Dec, MD  ? Number of children: 3  ? Years of education: Not on file  ? Highest education level: Not on file  ?Occupational History  ? Occupation: AUDIOLOGIST, retired  ?Tobacco Use  ? Smoking status: Never  ? Smokeless tobacco: Never  ? Tobacco comments:  ?  Pt reports tried once in 7th grade//ms  ?Vaping Use  ? Vaping Use: Never used  ?Substance and Sexual Activity  ? Alcohol use: Not Currently  ?  Alcohol/week: 1.0 standard drink  ?  Types: 1 Glasses of wine per week  ?  Comment: rare  ? Drug use: No  ? Sexual activity: Never  ?  Birth control/protection: None  ?Other Topics Concern  ? Not on file  ?Social History Narrative  ? 3 children &  married to orthopedist. 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  ? ?Social Determinants of Health  ? ?Financial Resource Strain: Not on file  ?Food Insecurity: Not on file  ?Transportation Needs: Not on file  ?Physical Activity: Not on file  ?Stress: Not on file  ?Social Connections: Not on file  ?Intimate Partner Violence: Not on file  ? ? ?Family History  ?Problem Relation Age of Onset  ? Ulcers Mother   ? Glaucoma Mother   ? Heart disease Mother   ? Diabetes Mother   ? Depression Mother   ? Sudden death Mother   ? Obesity Mother   ? Glaucoma Maternal Grandmother   ? Heart disease Maternal Grandmother   ? Heart disease Maternal Grandfather   ? Colon cancer Neg Hx   ? ? ?Past Medical History:  ?Diagnosis Date  ? Adenomatous colon polyp   ? Allergic rhinitis   ? Arthritis   ? Back pain   ? Constipation   ? Diabetes mellitus   ? Diverticulosis   ? External hemorrhoids   ? Fibromyalgia   ? Gallbladder problem   ? GERD (gastroesophageal reflux disease)   ? Glaucoma   ? Glaucoma   ? Hemorrhoids   ? grade 3   ? Hip pain   ? IBS (irritable bowel syndrome)   ? IDA (iron deficiency anemia)   ? Iron deficiency anemia 06/05/2014  ? Irregular heartbeat   ? Leg edema   ? Leg numbness   ? Leg pain   ? Low tension glaucoma   ?  Neuropathy, leg   ? Ovarian cyst 11/20/00  ? Prediabetes   ? Sleep apnea   ? CPAP  ? Sliding hiatal hernia   ? ? ?Past Surgical History:  ?Procedure Laterality Date  ? CARPAL TUNNEL RELEASE Bilateral   ? CATA

## 2021-05-24 NOTE — Addendum Note (Signed)
Addended by: Wyvonnia Lora on: 05/24/2021 10:28 AM ? ? Modules accepted: Orders ? ?

## 2021-05-24 NOTE — Patient Instructions (Signed)
There are well-accepted and sensible ways to reduce risk for Alzheimers disease and other degenerative brain disorders . ? ?Exercise Daily Walk A daily 20 minute walk should be part of your routine. Disease related apathy can be a significant roadblock to exercise and the only way to overcome this is to make it a daily routine and perhaps have a reward at the end (something your loved one loves to eat or drink perhaps) or a personal trainer coming to the home can also be very useful. Most importantly, the patient is much more likely to exercise if the caregiver / spouse does it with him/her. In general a structured, repetitive schedule is best. ? ?General Health: Any diseases which effect your body will effect your brain such as a pneumonia, urinary infection, blood clot, heart attack or stroke. Keep contact with your primary care doctor for regular follow ups. ? ?Sleep. A good nights sleep is healthy for the brain. Seven hours is recommended. If you have insomnia or poor sleep habits we can give you some instructions. If you have sleep apnea wear your mask. ? ?Diet: Eating a heart healthy diet is also a good idea; fish and poultry instead of red meat, nuts (mostly non-peanuts), vegetables, fruits, olive oil or canola oil (instead of butter), minimal salt (use other spices to flavor foods), whole grain rice, bread, cereal and pasta and wine in moderation.Research is now showing that the MIND diet, which is a combination of The Mediterranean diet and the DASH diet, is beneficial for cognitive processing and longevity. Information about this diet can be found in The MIND Diet, a book by Doyne Keel, MS, RDN, and online at [employer].si ? ?Finances, Power of Producer, television/film/video Directives: You should consider putting legal safeguards in place with regard to financial and medical decision making. While the spouse always has power of attorney for medical and financial issues in the  absence of any form, you should consider what you want in case the spouse / caregiver is no longer around or capable of making decisions.  ? ?The Alzheimers Association Position on Disease Prevention ? ?Can Alzheimer's be prevented? It's a question that continues to intrigue researchers and fuel new investigations. There are no clear-cut answers yet -- partially due to the need for more large-scale studies in diverse populations -- but promising research is under way. The Alzheimer's Association? is leading the worldwide effort to find a treatment for Alzheimer's, delay its onset and prevent it from developing.  ? ?What causes Alzheimer's? ?Experts agree that in the vast majority of cases, Alzheimer's, like other common chronic conditions, probably develops as a result of complex interactions among multiple factors, including age, genetics, environment, lifestyle and coexisting medical conditions. Although some risk factors -- such as age or genes -- cannot be changed, other risk factors -- such as high blood pressure and lack of exercise -- usually can be changed to help reduce risk. Research in these areas may lead to new ways to detect those at highest risk. ? ?Prevention studies ?A small percentage of people with Alzheimer's disease (less than 1 percent) have an early-onset type associated with genetic mutations. Individuals who have these genetic mutations are guaranteed to develop the disease. An ongoing clinical trial conducted by the [facility] ([facility]), is testing whether antibodies to beta-amyloid can reduce the accumulation of beta-amyloid plaque in the brains of people with such genetic mutations and thereby reduce, delay or prevent symptoms. Participants in the trial are receiving antibodies (  or placebo) before they develop symptoms, and the development of beta-amyloid plaques is being monitored by brain scans and other tests. ? ?Another clinical trial, known as the A4 trial  (Anti-Amyloid Treatment in Asymptomatic Alzheimer's), is testing whether antibodies to beta-amyloid can reduce the risk of Alzheimer's disease in older people (ages 73 to 61) at high risk for the disease. The A4 trial is being conducted by the Alzheimer's Disease Cooperative Study. ? ?Though research is still evolving, evidence is strong that people can reduce their risk by making key lifestyle changes, including participating in regular activity and maintaining good heart health. Based on this research, the Alzheimer's Association offers 10 Ways to Love Your Brain -- a collection of tips that can reduce the risk of cognitive decline. ? ?Heart-head connection ? ?New research shows there are things we can do to reduce the risk of mild cognitive impairment and dementia. ? ?Several conditions known to increase the risk of cardiovascular disease -- such as high blood pressure, diabetes and high cholesterol -- also increase the risk of developing Alzheimer's. Some autopsy studies show that as many as 9 percent of individuals with Alzheimer's disease also have cardiovascular disease. ? ?A longstanding question is why some people develop hallmark Alzheimer's plaques and tangles but do not develop the symptoms of Alzheimer's. Vascular disease may help researchers eventually find an answer. Some autopsy studies suggest that plaques and tangles may be present in the brain without causing symptoms of cognitive decline unless the brain also shows evidence of vascular disease. More research is needed to better understand the link between vascular health and Alzheimer's. ? ?Physical exercise and diet ?Regular physical exercise may be a beneficial strategy to lower the risk of Alzheimer's and vascular dementia. Exercise may directly benefit brain cells by increasing blood and oxygen flow in the brain. Because of its known cardiovascular benefits, a medically approved exercise program is a valuable part of any overall wellness  plan. ? ?Current evidence suggests that heart-healthy eating may also help protect the brain. Heart-healthy eating includes limiting the intake of sugar and saturated fats and making sure to eat plenty of fruits, vegetables, and whole grains. No one diet is best. Two diets that have been studied and may be beneficial are the DASH (Dietary Approaches to Stop Hypertension) diet and the Mediterranean diet. The DASH diet emphasizes vegetables, fruits and fat-free or low-fat dairy products; includes whole grains, fish, poultry, beans, seeds, nuts and vegetable oils; and limits sodium, sweets, sugary beverages and red meats. A Mediterranean diet includes relatively little red meat and emphasizes whole grains, fruits and vegetables, fish and shellfish, and nuts, olive oil and other healthy fats. ? ?Social connections and intellectual activity ?A number of studies indicate that maintaining strong social connections and keeping mentally active as we age might lower the risk of cognitive decline and Alzheimer's. Experts are not certain about the reason for this association. It may be due to direct mechanisms through which social and mental stimulation strengthen connections between nerve cells in the brain. ? ?Head trauma ?There appears to be a strong link between future risk of Alzheimer's and serious head trauma, especially when injury involves loss of consciousness. You can help reduce your risk of Alzheimer's by protecting your head. ? Wear a seat belt ? Use a helmet when participating in sports ? "Fall-proof" your home ?  ?What you can do now ?While research is not yet conclusive, certain lifestyle choices, such as physical activity and diet, may help support brain  health and prevent Alzheimer's. Many of these lifestyle changes have been shown to lower the risk of other diseases, like heart disease and diabetes, which have been linked to Alzheimer's. With few drawbacks and plenty of known benefits, healthy lifestyle  choices can improve your health and possibly protect your brain. ? ?Learn more about brain health. ?You can help increase our knowledge by considering participation in a clinical study. Our free clinical trial matc

## 2021-05-25 DIAGNOSIS — E1140 Type 2 diabetes mellitus with diabetic neuropathy, unspecified: Secondary | ICD-10-CM | POA: Diagnosis not present

## 2021-05-25 DIAGNOSIS — Z79899 Other long term (current) drug therapy: Secondary | ICD-10-CM | POA: Diagnosis not present

## 2021-05-25 DIAGNOSIS — G4733 Obstructive sleep apnea (adult) (pediatric): Secondary | ICD-10-CM | POA: Diagnosis not present

## 2021-05-25 DIAGNOSIS — T466X5A Adverse effect of antihyperlipidemic and antiarteriosclerotic drugs, initial encounter: Secondary | ICD-10-CM | POA: Diagnosis not present

## 2021-05-26 LAB — ACETYLCHOLINE RECEPTOR, BINDING: AChR Binding Ab, Serum: <0.03 nmol/L (ref 0.00–0.24)

## 2021-05-29 ENCOUNTER — Other Ambulatory Visit: Payer: Self-pay | Admitting: Orthopedic Surgery

## 2021-05-29 DIAGNOSIS — M5441 Lumbago with sciatica, right side: Secondary | ICD-10-CM

## 2021-05-30 ENCOUNTER — Encounter: Payer: Self-pay | Admitting: Neurology

## 2021-05-30 NOTE — Progress Notes (Signed)
Negative myasthenia test.

## 2021-06-01 DIAGNOSIS — F330 Major depressive disorder, recurrent, mild: Secondary | ICD-10-CM | POA: Diagnosis not present

## 2021-06-01 DIAGNOSIS — G720 Drug-induced myopathy: Secondary | ICD-10-CM | POA: Diagnosis not present

## 2021-06-01 DIAGNOSIS — E1140 Type 2 diabetes mellitus with diabetic neuropathy, unspecified: Secondary | ICD-10-CM | POA: Diagnosis not present

## 2021-06-01 DIAGNOSIS — E785 Hyperlipidemia, unspecified: Secondary | ICD-10-CM | POA: Diagnosis not present

## 2021-06-01 DIAGNOSIS — R7309 Other abnormal glucose: Secondary | ICD-10-CM | POA: Diagnosis not present

## 2021-06-16 DIAGNOSIS — Z20822 Contact with and (suspected) exposure to covid-19: Secondary | ICD-10-CM | POA: Diagnosis not present

## 2021-06-27 DIAGNOSIS — Z961 Presence of intraocular lens: Secondary | ICD-10-CM | POA: Diagnosis not present

## 2021-06-27 DIAGNOSIS — H04123 Dry eye syndrome of bilateral lacrimal glands: Secondary | ICD-10-CM | POA: Diagnosis not present

## 2021-06-27 DIAGNOSIS — H401132 Primary open-angle glaucoma, bilateral, moderate stage: Secondary | ICD-10-CM | POA: Diagnosis not present

## 2021-06-27 DIAGNOSIS — D3131 Benign neoplasm of right choroid: Secondary | ICD-10-CM | POA: Diagnosis not present

## 2021-06-27 DIAGNOSIS — H43813 Vitreous degeneration, bilateral: Secondary | ICD-10-CM | POA: Diagnosis not present

## 2021-06-27 DIAGNOSIS — E119 Type 2 diabetes mellitus without complications: Secondary | ICD-10-CM | POA: Diagnosis not present

## 2021-06-29 DIAGNOSIS — Z23 Encounter for immunization: Secondary | ICD-10-CM | POA: Diagnosis not present

## 2021-07-01 DIAGNOSIS — F329 Major depressive disorder, single episode, unspecified: Secondary | ICD-10-CM | POA: Diagnosis not present

## 2021-07-04 ENCOUNTER — Other Ambulatory Visit: Payer: Self-pay

## 2021-07-04 ENCOUNTER — Telehealth: Payer: Self-pay

## 2021-07-04 DIAGNOSIS — G8929 Other chronic pain: Secondary | ICD-10-CM

## 2021-07-04 MED ORDER — HYDROCODONE-ACETAMINOPHEN 5-325 MG PO TABS: 1.0000 | ORAL_TABLET | Freq: Four times a day (QID) | ORAL | 0 refills | Status: DC | PRN

## 2021-07-04 NOTE — Telephone Encounter (Signed)
Request sent to provider.

## 2021-07-04 NOTE — Telephone Encounter (Signed)
Hydrocodone-Acetaminophen 5/325 MG Qty 30 Tablets ? ?Take 1 tablet by mouth every 6 (six) hours as needed for moderate pain ? ?PATIENT USES [facility] ON [address] ?

## 2021-07-06 DIAGNOSIS — Z20822 Contact with and (suspected) exposure to covid-19: Secondary | ICD-10-CM | POA: Diagnosis not present

## 2021-07-25 ENCOUNTER — Other Ambulatory Visit: Payer: Self-pay | Admitting: Orthopedic Surgery

## 2021-07-25 DIAGNOSIS — M5441 Lumbago with sciatica, right side: Secondary | ICD-10-CM

## 2021-07-26 ENCOUNTER — Telehealth: Payer: Self-pay | Admitting: Radiology

## 2021-07-26 DIAGNOSIS — R29898 Other symptoms and signs involving the musculoskeletal system: Secondary | ICD-10-CM

## 2021-07-26 DIAGNOSIS — M545 Low back pain, unspecified: Secondary | ICD-10-CM

## 2021-07-26 NOTE — Telephone Encounter (Signed)
Patient has had an injury to her back, throwing luggage into train while on a trip.  Having LBP, left leg pain and sciatica, left LE weakness.  Rest, OTC meds/anti inflamm and prescription prednisone have not helped.  Concern for rutpture disc.  Per Dr Aline Brochure ok to order MRI Lumbar Spine w/o and will get patient scheduled 07/29/21 Thursday after 5pm or Friday 07/30/21 for this.

## 2021-07-26 NOTE — Telephone Encounter (Signed)
MRI ordered

## 2021-07-28 DIAGNOSIS — K648 Other hemorrhoids: Secondary | ICD-10-CM | POA: Diagnosis present

## 2021-07-28 DIAGNOSIS — K5900 Constipation, unspecified: Secondary | ICD-10-CM | POA: Diagnosis present

## 2021-07-28 DIAGNOSIS — M47816 Spondylosis without myelopathy or radiculopathy, lumbar region: Secondary | ICD-10-CM | POA: Diagnosis not present

## 2021-07-28 DIAGNOSIS — M5127 Other intervertebral disc displacement, lumbosacral region: Secondary | ICD-10-CM | POA: Diagnosis not present

## 2021-07-28 DIAGNOSIS — M5416 Radiculopathy, lumbar region: Secondary | ICD-10-CM | POA: Diagnosis not present

## 2021-07-28 DIAGNOSIS — K625 Hemorrhage of anus and rectum: Secondary | ICD-10-CM | POA: Diagnosis not present

## 2021-07-28 DIAGNOSIS — R109 Unspecified abdominal pain: Secondary | ICD-10-CM | POA: Diagnosis not present

## 2021-07-28 DIAGNOSIS — D649 Anemia, unspecified: Secondary | ICD-10-CM | POA: Diagnosis not present

## 2021-07-28 DIAGNOSIS — M47817 Spondylosis without myelopathy or radiculopathy, lumbosacral region: Secondary | ICD-10-CM | POA: Diagnosis not present

## 2021-07-28 DIAGNOSIS — M4726 Other spondylosis with radiculopathy, lumbar region: Secondary | ICD-10-CM | POA: Diagnosis not present

## 2021-07-28 DIAGNOSIS — M5116 Intervertebral disc disorders with radiculopathy, lumbar region: Secondary | ICD-10-CM | POA: Diagnosis present

## 2021-07-28 DIAGNOSIS — M48061 Spinal stenosis, lumbar region without neurogenic claudication: Secondary | ICD-10-CM | POA: Diagnosis present

## 2021-07-29 ENCOUNTER — Other Ambulatory Visit: Payer: Medicare Other

## 2021-07-29 DIAGNOSIS — R109 Unspecified abdominal pain: Secondary | ICD-10-CM | POA: Diagnosis not present

## 2021-07-29 DIAGNOSIS — M5416 Radiculopathy, lumbar region: Secondary | ICD-10-CM | POA: Diagnosis not present

## 2021-07-29 DIAGNOSIS — M4726 Other spondylosis with radiculopathy, lumbar region: Secondary | ICD-10-CM | POA: Diagnosis not present

## 2021-07-29 DIAGNOSIS — K625 Hemorrhage of anus and rectum: Secondary | ICD-10-CM | POA: Diagnosis not present

## 2021-07-29 DIAGNOSIS — K648 Other hemorrhoids: Secondary | ICD-10-CM | POA: Diagnosis present

## 2021-07-29 DIAGNOSIS — M5116 Intervertebral disc disorders with radiculopathy, lumbar region: Secondary | ICD-10-CM | POA: Diagnosis present

## 2021-07-29 DIAGNOSIS — M47816 Spondylosis without myelopathy or radiculopathy, lumbar region: Secondary | ICD-10-CM | POA: Diagnosis not present

## 2021-07-29 DIAGNOSIS — D649 Anemia, unspecified: Secondary | ICD-10-CM | POA: Diagnosis not present

## 2021-07-29 DIAGNOSIS — K5900 Constipation, unspecified: Secondary | ICD-10-CM | POA: Diagnosis present

## 2021-07-29 DIAGNOSIS — M48061 Spinal stenosis, lumbar region without neurogenic claudication: Secondary | ICD-10-CM | POA: Diagnosis present

## 2021-10-02 ENCOUNTER — Other Ambulatory Visit: Payer: Self-pay | Admitting: Orthopedic Surgery

## 2021-10-02 DIAGNOSIS — M5441 Lumbago with sciatica, right side: Secondary | ICD-10-CM

## 2021-10-03 ENCOUNTER — Ambulatory Visit (HOSPITAL_COMMUNITY)
Admission: RE | Admit: 2021-10-03 | Discharge: 2021-10-03 | Disposition: A | Discharge status: Home / Self Care | Payer: Medicare Other | Source: Ambulatory Visit | Attending: Orthopedic Surgery | Admitting: Orthopedic Surgery

## 2021-10-03 ENCOUNTER — Other Ambulatory Visit: Payer: Self-pay | Admitting: Orthopedic Surgery

## 2021-10-03 DIAGNOSIS — S0990XA Unspecified injury of head, initial encounter: Secondary | ICD-10-CM

## 2021-10-03 DIAGNOSIS — J329 Chronic sinusitis, unspecified: Secondary | ICD-10-CM | POA: Diagnosis not present

## 2021-10-03 DIAGNOSIS — J341 Cyst and mucocele of nose and nasal sinus: Secondary | ICD-10-CM | POA: Diagnosis not present

## 2021-10-03 DIAGNOSIS — I6782 Cerebral ischemia: Secondary | ICD-10-CM | POA: Diagnosis not present

## 2021-10-05 ENCOUNTER — Encounter (INDEPENDENT_AMBULATORY_CARE_PROVIDER_SITE_OTHER): Payer: Self-pay

## 2021-11-02 DIAGNOSIS — E119 Type 2 diabetes mellitus without complications: Secondary | ICD-10-CM | POA: Diagnosis not present

## 2021-11-02 DIAGNOSIS — E785 Hyperlipidemia, unspecified: Secondary | ICD-10-CM | POA: Diagnosis not present

## 2021-11-02 DIAGNOSIS — Z79899 Other long term (current) drug therapy: Secondary | ICD-10-CM | POA: Diagnosis not present

## 2021-11-02 DIAGNOSIS — F32A Depression, unspecified: Secondary | ICD-10-CM | POA: Diagnosis not present

## 2021-11-04 DIAGNOSIS — H04123 Dry eye syndrome of bilateral lacrimal glands: Secondary | ICD-10-CM | POA: Diagnosis not present

## 2021-11-04 DIAGNOSIS — Z961 Presence of intraocular lens: Secondary | ICD-10-CM | POA: Diagnosis not present

## 2021-11-04 DIAGNOSIS — E119 Type 2 diabetes mellitus without complications: Secondary | ICD-10-CM | POA: Diagnosis not present

## 2021-11-04 DIAGNOSIS — H43813 Vitreous degeneration, bilateral: Secondary | ICD-10-CM | POA: Diagnosis not present

## 2021-11-04 DIAGNOSIS — D3131 Benign neoplasm of right choroid: Secondary | ICD-10-CM | POA: Diagnosis not present

## 2021-11-04 DIAGNOSIS — H401132 Primary open-angle glaucoma, bilateral, moderate stage: Secondary | ICD-10-CM | POA: Diagnosis not present

## 2021-11-08 ENCOUNTER — Other Ambulatory Visit: Payer: Self-pay | Admitting: Physician Assistant

## 2021-11-09 DIAGNOSIS — D509 Iron deficiency anemia, unspecified: Secondary | ICD-10-CM | POA: Diagnosis not present

## 2021-11-09 DIAGNOSIS — E1140 Type 2 diabetes mellitus with diabetic neuropathy, unspecified: Secondary | ICD-10-CM | POA: Diagnosis not present

## 2021-11-09 DIAGNOSIS — Z23 Encounter for immunization: Secondary | ICD-10-CM | POA: Diagnosis not present

## 2021-11-09 DIAGNOSIS — R7309 Other abnormal glucose: Secondary | ICD-10-CM | POA: Diagnosis not present

## 2021-11-09 DIAGNOSIS — F329 Major depressive disorder, single episode, unspecified: Secondary | ICD-10-CM | POA: Diagnosis not present

## 2021-11-13 NOTE — Progress Notes (Unsigned)
Debra Harris,  [id number]   CLINIC:  Medical Oncology/Hematology  PCP:  Asencion Noble, MD [address] [name] [address] [phone]   REASON FOR VISIT:  Follow-up for iron deficiency anemia  CURRENT THERAPY: Intermittent IV iron  INTERVAL HISTORY:  Debra Harris 71 y.o. female returns for routine follow-up of her iron deficiency anemia.  She was last seen by Dr. Delton Coombes on 03/29/2020, lost to follow-up after that visit.  She last received IV iron with Feraheme x2 on 05/11/2020 and 06/09/2020.  At today's visit, she reports feeling fatigued.  She reports that her blood and iron levels were normal in March 2023, but since that time she had increased hemorrhoidal bleeding and her hemoglobin "took a steep dive."  She reports fatigue, headaches, and dyspnea on exertion.  No pica, restless legs, chest pain, lightheadedness, or syncope.  She is unable to tolerate iron tablet due to constipation that worsens her rectal bleeding.  She is taking vitamin D daily.  She has 20% energy and 50% appetite. She endorses that she is maintaining a stable weight.   REVIEW OF SYSTEMS:  Review of Systems  Constitutional:  Positive for fatigue. Negative for appetite change, chills, diaphoresis, fever and unexpected weight change.  HENT:   Negative for lump/mass and nosebleeds.   Eyes:  Negative for eye problems.  Respiratory:  Positive for shortness of breath. Negative for cough and hemoptysis.   Cardiovascular:  Positive for palpitations. Negative for chest pain and leg swelling.  Gastrointestinal:  Negative for abdominal pain, blood in stool, constipation, diarrhea, nausea and vomiting.  Genitourinary:  Negative for hematuria.   Musculoskeletal:  Positive for back pain.  Skin: Negative.   Neurological:  Positive for numbness. Negative for dizziness, headaches and light-headedness.  Hematological:  Does not bruise/bleed easily.   Psychiatric/Behavioral:  Positive for sleep disturbance.       PAST MEDICAL/SURGICAL HISTORY:  Past Medical History:  Diagnosis Date   Adenomatous colon polyp    Allergic rhinitis    Arthritis    Back pain    Constipation    Diabetes mellitus    Diverticulosis    External hemorrhoids    Fibromyalgia    Gallbladder problem    GERD (gastroesophageal reflux disease)    Glaucoma    Glaucoma    Hemorrhoids    grade 3    Hip pain    IBS (irritable bowel syndrome)    IDA (iron deficiency anemia)    Iron deficiency anemia 06/05/2014   Irregular heartbeat    Leg edema    Leg numbness    Leg pain    Low tension glaucoma    Neuropathy, leg    Ovarian cyst 11/20/00   Prediabetes    Sleep apnea    CPAP   Sliding hiatal hernia    Past Surgical History:  Procedure Laterality Date   CARPAL TUNNEL RELEASE Bilateral    CATARACT EXTRACTION     bilateral   CHOLECYSTECTOMY     COLONOSCOPY  2005   Small external hemorrhoids, single sigmoid colon diverticulum, small polyp at the hepatic flexure focally adenomatous.   COLONOSCOPY  2010   Dr. Gala Romney, Versed 4/Demerol 75. Pancolonic diverticula, external hemorrhoids. Colonoscopy every 5 years for history of adenomatous polyps   COLONOSCOPY  2014   Dr. Olevia Perches: Propofol. mild diverticulosis in the sigmoid colon, random colon biopsies benign.   COLONOSCOPY WITH PROPOFOL N/A 09/13/2015   Procedure: COLONOSCOPY WITH PROPOFOL;  Surgeon: Daneil Dolin, MD;  [facility]: [facility];  Service: Endoscopy;  Laterality: N/A;  1030   ESOPHAGEAL DILATION N/A 10/26/2014   Procedure: ESOPHAGEAL DILATION [place];  Surgeon: Daneil Dolin, MD;  Location: [facility];  Service: Endoscopy;  Laterality: N/A;   ESOPHAGOGASTRODUODENOSCOPY  2010   Dr. Gala Romney, distal esophageal erosions, Venia Minks dilator passed. Comment small hiatal hernia, patulous EG junction.   ESOPHAGOGASTRODUODENOSCOPY (EGD) WITH PROPOFOL N/A 10/26/2014   Dr.Rourk- distal esophageal erosions c/w  mild erosive reflux esophagitis. hiatal hernia, o/w normal EGD   ESOPHAGOGASTRODUODENOSCOPY (EGD) WITH PROPOFOL N/A 09/13/2015   Procedure: ESOPHAGOGASTRODUODENOSCOPY (EGD) WITH PROPOFOL;  Surgeon: Daneil Dolin, MD;  [facility];  Service: Endoscopy;  Laterality: N/A;   GIVENS CAPSULE STUDY N/A 10/26/2014   couple of mucosal breaks/erosions but essentially unremarkable small bowel study   HEMORRHOID BANDING     KNEE SURGERY     right-arthroscopy   OOPHORECTOMY  age 55s   PARTIAL HYSTERECTOMY  age 35s   RECTAL EXAM UNDER ANESTHESIA N/A 10/26/2014   circumferential grade 3 hemoorrhoids easily reducible   thumb surger Bilateral    trigger thumb     SOCIAL HISTORY:  Social History   Socioeconomic History   Marital status: Married    Spouse name: J. Orland Dec, MD   Number of children: 3   Years of education: Not on file   Highest education level: Not on file  Occupational History   Occupation: AUDIOLOGIST, retired  Tobacco Use   Smoking status: Never   Smokeless tobacco: Never   Tobacco comments:    Pt reports tried once in 7th grade//ms  Vaping Use   Vaping Use: Never used  Substance and Sexual Activity   Alcohol use: Not Currently    Alcohol/week: 1.0 standard drink of alcohol    Types: 1 Glasses of wine per week    Comment: rare   Drug use: No   Sexual activity: Never    Birth control/protection: None  Other Topics Concern   Not on file  Social History Narrative   3 children & married to orthopedist. 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   Social Determinants of Health   Financial Resource Strain: Not on file  Food Insecurity: Not on file  Transportation Needs: Not on file  Physical Activity: Inactive (03/29/2020)   Exercise Vital Sign    Days of Exercise per Week: 0 days    Minutes of Exercise per Session: 0 min  Stress: Not on file  Social Connections: Not on file  Intimate Partner Violence: Not At Risk (03/29/2020)   Humiliation,  Afraid, Rape, and Kick questionnaire    Fear of Current or Ex-Partner: No    Emotionally Abused: No    Physically Abused: No    Sexually Abused: No    FAMILY HISTORY:  Family History  Problem Relation Age of Onset   Ulcers Mother    Glaucoma Mother    Heart disease Mother    Diabetes Mother    Depression Mother    Sudden death Mother    Obesity Mother    Glaucoma Maternal Grandmother    Heart disease Maternal Grandmother    Heart disease Maternal Grandfather    Colon cancer Neg Hx     CURRENT MEDICATIONS:  Outpatient Encounter Medications as of 11/14/2021  Medication Sig   diphenhydramine-acetaminophen (TYLENOL PM EXTRA STRENGTH) 25-500 MG TABS tablet Take 1 tablet by mouth at bedtime as needed.   guaiFENesin 200  MG tablet Take 400 mg by mouth daily.   HYDROcodone-acetaminophen (NORCO/VICODIN) 5-325 MG tablet Take 1 tablet by mouth every 6 (six) hours as needed for moderate pain.   LUMIGAN 0.01 % SOLN daily.   Magnesium 400 MG TABS Take 1 tablet by mouth daily.   Multiple Vitamins-Minerals (MULTIVITAMIN ADULT PO) Take 1 tablet by mouth daily.   naproxen sodium (ALEVE) 220 MG tablet Take 220 mg by mouth.   NON FORMULARY CPAP at night    tiZANidine (ZANAFLEX) 4 MG tablet TAKE 1 TABLET(4 MG) BY MOUTH DAILY   Wheat Dextrin (BENEFIBER) POWD Take by mouth daily.   No facility-administered encounter medications on file as of 11/14/2021.    ALLERGIES:  Allergies  Allergen Reactions   Injectafer [Ferric Carboxymaltose]     Has tolerated feraheme without problems     PHYSICAL EXAM:  ECOG PERFORMANCE STATUS: 1 - Symptomatic but completely ambulatory  There were no vitals filed for this visit. There were no vitals filed for this visit. Physical Exam Constitutional:      Appearance: Normal appearance. She is obese.  HENT:     Head: Normocephalic and atraumatic.     Mouth/Throat:     Mouth: Mucous membranes are moist.  Eyes:     Extraocular Movements: Extraocular  movements intact.     Pupils: Pupils are equal, round, and reactive to light.  Cardiovascular:     Rate and Rhythm: Normal rate and regular rhythm.     Pulses: Normal pulses.     Heart sounds: Normal heart sounds.  Pulmonary:     Effort: Pulmonary effort is normal.     Breath sounds: Normal breath sounds.  Abdominal:     General: Bowel sounds are normal.     Palpations: Abdomen is soft.     Tenderness: There is no abdominal tenderness.  Musculoskeletal:        General: No swelling.     Right lower leg: No edema.     Left lower leg: No edema.  Lymphadenopathy:     Cervical: No cervical adenopathy.  Skin:    General: Skin is warm and dry.  Neurological:     General: No focal deficit present.     Mental Status: She is alert and oriented to person, place, and time.  Psychiatric:        Mood and Affect: Mood normal.        Behavior: Behavior normal.      LABORATORY DATA:  I have reviewed the labs as listed.  CBC    Component Value Date/Time   WBC 4.1 09/27/2020 1515   RBC 4.35 09/27/2020 1515   HGB 12.4 09/27/2020 1515   HCT 38.5 09/27/2020 1515   PLT 199 09/27/2020 1515   MCV 88.5 09/27/2020 1515   MCH 28.5 09/27/2020 1515   MCHC 32.2 09/27/2020 1515   RDW 13.2 09/27/2020 1515   LYMPHSABS 1.0 09/27/2020 1515   MONOABS 0.4 09/27/2020 1515   EOSABS 0.2 09/27/2020 1515   BASOSABS 0.0 09/27/2020 1515      Latest Ref Rng & Units 09/27/2020    3:15 PM 03/23/2020    1:08 PM 01/28/2020    1:58 PM  CMP  Glucose 70 - 99 mg/dL 129  108  132   BUN 8 - 23 mg/dL '19  12  17   '$ Creatinine 0.44 - 1.00 mg/dL 0.64  0.70  0.63   Sodium 135 - 145 mmol/L 135  138  139   Potassium 3.5 -  5.1 mmol/L 3.4  3.8  3.8   Chloride 98 - 111 mmol/L 104  103  107   CO2 22 - 32 mmol/L '26  28  27   '$ Calcium 8.9 - 10.3 mg/dL 8.8  9.7  8.6   Total Protein 6.5 - 8.1 g/dL 7.1  7.6  6.7   Total Bilirubin 0.3 - 1.2 mg/dL 0.8  0.8  0.5   Alkaline Phos 38 - 126 U/L 59  56  51   AST 15 - 41 U/L '20  20  15    '$ ALT 0 - 44 U/L '21  23  17     '$ DIAGNOSTIC IMAGING:  I have independently reviewed the relevant imaging and discussed with the patient.  ASSESSMENT & PLAN: 1.  Iron deficiency anemia: - Iron deficiency state from combination of malabsorption and chronic GI loss. - Colonoscopy on 09/13/2015 showing diverticuli in the sigmoid colon, descending colon and transverse colon, internal and external hemorrhoids banded.  - Last received IV iron with Feraheme x2 on 05/11/2020 and 06/09/2020. - She continues to have on and off hemorrhoidal bleeding. - Symptomatic with fatigue and dyspnea on exertion - Most recent labs sent by PCP (11/02/2021): Hgb 9.0/MCV 72, ferritin 11, iron saturation 20% - PLAN: Recommend IV Feraheme x2 with repeat labs and phone visit in 8 weeks.     2.  Vitamin D deficiency: - Vitamin D level was 19.96 (January 2022), and she was recommended to start taking vitamin D 2000 to 5000 units daily - She is currently taking vitamin D daily - PLAN: We will check vitamin D prior to follow-up visit in 8 weeks.   All questions were answered. The patient knows to call the clinic with any problems, questions or concerns.  Medical decision making: Low  Time spent on visit: I spent 15 minutes counseling the patient face to face. The total time spent in the appointment was 22 minutes and more than 50% was on counseling.   Harriett Rush, PA-C  07/14/2021 9:28 AM

## 2021-11-14 ENCOUNTER — Inpatient Hospital Stay: Payer: Medicare Other | Attending: Physician Assistant | Admitting: Physician Assistant

## 2021-11-14 VITALS — BP 120/62 | HR 70 | Temp 97.4°F | Resp 16 | Wt 211.2 lb

## 2021-11-14 DIAGNOSIS — E559 Vitamin D deficiency, unspecified: Secondary | ICD-10-CM | POA: Insufficient documentation

## 2021-11-14 DIAGNOSIS — D500 Iron deficiency anemia secondary to blood loss (chronic): Secondary | ICD-10-CM | POA: Insufficient documentation

## 2021-11-14 DIAGNOSIS — K909 Intestinal malabsorption, unspecified: Secondary | ICD-10-CM | POA: Insufficient documentation

## 2021-11-14 DIAGNOSIS — K922 Gastrointestinal hemorrhage, unspecified: Secondary | ICD-10-CM | POA: Diagnosis not present

## 2021-11-14 NOTE — Patient Instructions (Signed)
[facility] **   You were seen today by Tarri Abernethy PA-C for your iron deficiency anemia.    IRON DEFICIENCY ANEMIA We will schedule you for IV iron with Feraheme x2 doses (first appointment tomorrow) Repeat labs with phone visit in 8 weeks If you continue to have significant rectal bleeding, please reach out to your GI doctor for further treatment.  FOLLOW-UP APPOINTMENT: Phone visit in 8 weeks, after labs  ** Thank you for trusting me with your healthcare!  I strive to provide all of my patients with quality care at each visit.  If you receive a survey for this visit, I would be so grateful to you for taking the time to provide feedback.  Thank you in advance!  ~ Juanna Pudlo                   Dr. Derek Jack   &   Tarri Abernethy, PA-C   - - - - - - - - - - - - - - - - - -    Thank you for choosing [facility] to provide your oncology and hematology care.  To afford each patient quality time with our provider, please arrive at least 15 minutes before your scheduled appointment time.   If you have a lab appointment with the [facility] please come in thru the Main Entrance and check in at the main information desk.  You need to re-schedule your appointment should you arrive 10 or more minutes late.  We strive to give you quality time with our providers, and arriving late affects you and other patients whose appointments are after yours.  Also, if you no show three or more times for appointments you may be dismissed from the clinic at the providers discretion.     Again, thank you for choosing [facility].  Our hope is that these requests will decrease the amount of time that you wait before being seen by our physicians.       _____________________________________________________________  Should you have questions after your visit to [facility], please contact our office at [phone] and follow the prompts.  Our office hours are 8:00 a.m. and 4:30 p.m. Monday - Friday.  Please note that voicemails left after 4:00 p.m. may not be returned until the following business day.  We are closed weekends and major holidays.  You do have access to a nurse 24-7, just call the main number to the clinic [phone] and do not press any options, hold on the line and a nurse will answer the phone.    For prescription refill requests, have your pharmacy contact our office and allow 72 hours.

## 2021-11-15 ENCOUNTER — Inpatient Hospital Stay: Payer: Medicare Other

## 2021-11-15 VITALS — BP 132/61 | HR 71 | Temp 97.0°F | Resp 18

## 2021-11-15 DIAGNOSIS — D500 Iron deficiency anemia secondary to blood loss (chronic): Secondary | ICD-10-CM | POA: Diagnosis not present

## 2021-11-15 DIAGNOSIS — K625 Hemorrhage of anus and rectum: Secondary | ICD-10-CM

## 2021-11-15 DIAGNOSIS — K922 Gastrointestinal hemorrhage, unspecified: Secondary | ICD-10-CM | POA: Diagnosis not present

## 2021-11-15 DIAGNOSIS — E559 Vitamin D deficiency, unspecified: Secondary | ICD-10-CM | POA: Diagnosis not present

## 2021-11-15 DIAGNOSIS — K909 Intestinal malabsorption, unspecified: Secondary | ICD-10-CM | POA: Diagnosis not present

## 2021-11-15 MED ORDER — SODIUM CHLORIDE 0.9 % IV SOLN
510.0000 mg | Freq: Once | INTRAVENOUS | Status: AC
  Administered 2021-11-15: 510 mg via INTRAVENOUS
  Filled 2021-11-15: qty 510

## 2021-11-15 MED ORDER — LORATADINE 10 MG PO TABS: 10.0000 mg | ORAL_TABLET | Freq: Once | ORAL | Status: DC

## 2021-11-15 MED ORDER — SODIUM CHLORIDE 0.9 % IV SOLN: Freq: Once | INTRAVENOUS | Status: AC

## 2021-11-15 MED ORDER — ACETAMINOPHEN 325 MG PO TABS: 650.0000 mg | ORAL_TABLET | Freq: Once | ORAL | Status: DC

## 2021-11-15 NOTE — Patient Instructions (Signed)
[facility]  Discharge Instructions: Thank you for choosing [facility] to provide your oncology and hematology care.  If you have a lab appointment with the Cancer Center, please come in thru the Main Entrance and check in at the main information desk.  Wear comfortable clothing and clothing appropriate for easy access to any Portacath or PICC line.   We strive to give you quality time with your provider. You may need to reschedule your appointment if you arrive late (15 or more minutes).  Arriving late affects you and other patients whose appointments are after yours.  Also, if you miss three or more appointments without notifying the office, you may be dismissed from the clinic at the provider's discretion.      For prescription refill requests, have your pharmacy contact our office and allow 72 hours for refills to be completed.    Today you received the following chemotherapy and/or immunotherapy agents Feraheme      To help prevent nausea and vomiting after your treatment, we encourage you to take your nausea medication as directed.  BELOW ARE SYMPTOMS THAT SHOULD BE REPORTED IMMEDIATELY: *FEVER GREATER THAN 100.4 F (38 C) OR HIGHER *CHILLS OR SWEATING *NAUSEA AND VOMITING THAT IS NOT CONTROLLED WITH YOUR NAUSEA MEDICATION *UNUSUAL SHORTNESS OF BREATH *UNUSUAL BRUISING OR BLEEDING *URINARY PROBLEMS (pain or burning when urinating, or frequent urination) *BOWEL PROBLEMS (unusual diarrhea, constipation, pain near the anus) TENDERNESS IN MOUTH AND THROAT WITH OR WITHOUT PRESENCE OF ULCERS (sore throat, sores in mouth, or a toothache) UNUSUAL RASH, SWELLING OR PAIN  UNUSUAL VAGINAL DISCHARGE OR ITCHING   Items with * indicate a potential emergency and should be followed up as soon as possible or go to the Emergency Department if any problems should occur.  Please show the CHEMOTHERAPY ALERT CARD or IMMUNOTHERAPY ALERT CARD at check-in to the  Emergency Department and triage nurse.  Should you have questions after your visit or need to cancel or reschedule your appointment, please contact [facility] [phone]  and follow the prompts.  Office hours are 8:00 a.m. to 4:30 p.m. Monday - Friday. Please note that voicemails left after 4:00 p.m. may not be returned until the following business day.  We are closed weekends and major holidays. You have access to a nurse at all times for urgent questions. Please call the main number to the clinic [phone] and follow the prompts.  For any non-urgent questions, you may also contact your provider using MyChart. We now offer e-Visits for anyone 18 and older to request care online for non-urgent symptoms. For details visit [email or web address].   Also download the MyChart app! Go to the app store, search "MyChart", open the app, select , and log in with your MyChart username and password.  Masks are optional in the cancer centers. If you would like for your care team to wear a mask while they are taking care of you, please let them know. You may have one support person who is at least 71 years old accompany you for your appointments.  

## 2021-11-15 NOTE — Progress Notes (Signed)
Patient presents today for Feraheme infusion per providers order.  Vital signs within parameters for treatment.  Patient has no new complaints at this time.  Peripheral IV started and blood return noted pre and post infusion.    Stable during infusion without adverse affects.  Vital signs stable.  No complaints at this time.  Discharge from clinic ambulatory in stable condition.  Alert and oriented X 3.  Follow up with [facility] as scheduled.  

## 2021-11-22 ENCOUNTER — Inpatient Hospital Stay: Payer: Medicare Other

## 2021-11-22 VITALS — BP 138/58 | HR 63 | Temp 97.8°F | Resp 16

## 2021-11-22 DIAGNOSIS — D500 Iron deficiency anemia secondary to blood loss (chronic): Secondary | ICD-10-CM | POA: Diagnosis not present

## 2021-11-22 DIAGNOSIS — E559 Vitamin D deficiency, unspecified: Secondary | ICD-10-CM | POA: Diagnosis not present

## 2021-11-22 DIAGNOSIS — K922 Gastrointestinal hemorrhage, unspecified: Secondary | ICD-10-CM | POA: Diagnosis not present

## 2021-11-22 DIAGNOSIS — K909 Intestinal malabsorption, unspecified: Secondary | ICD-10-CM | POA: Diagnosis not present

## 2021-11-22 DIAGNOSIS — K625 Hemorrhage of anus and rectum: Secondary | ICD-10-CM

## 2021-11-22 MED ORDER — SODIUM CHLORIDE 0.9 % IV SOLN
510.0000 mg | Freq: Once | INTRAVENOUS | Status: AC
  Administered 2021-11-22: 510 mg via INTRAVENOUS
  Filled 2021-11-22: qty 17

## 2021-11-22 MED ORDER — SODIUM CHLORIDE 0.9 % IV SOLN: Freq: Once | INTRAVENOUS | Status: AC

## 2021-11-22 NOTE — Patient Instructions (Signed)
[facility]  Discharge Instructions: Thank you for choosing [facility] to provide your oncology and hematology care.  If you have a lab appointment with the [facility], please come in thru the Main Entrance and check in at the main information desk.  Wear comfortable clothing and clothing appropriate for easy access to any Portacath or PICC line.   We strive to give you quality time with your provider. You may need to reschedule your appointment if you arrive late (15 or more minutes).  Arriving late affects you and other patients whose appointments are after yours.  Also, if you miss three or more appointments without notifying the office, you may be dismissed from the clinic at the provider's discretion.      For prescription refill requests, have your pharmacy contact our office and allow 72 hours for refills to be completed.    Today you received the following, Iron infusion today   To help prevent nausea and vomiting after your treatment, we encourage you to take your nausea medication as directed.  BELOW ARE SYMPTOMS THAT SHOULD BE REPORTED IMMEDIATELY: *FEVER GREATER THAN 100.4 F (38 C) OR HIGHER *CHILLS OR SWEATING *NAUSEA AND VOMITING THAT IS NOT CONTROLLED WITH YOUR NAUSEA MEDICATION *UNUSUAL SHORTNESS OF BREATH *UNUSUAL BRUISING OR BLEEDING *URINARY PROBLEMS (pain or burning when urinating, or frequent urination) *BOWEL PROBLEMS (unusual diarrhea, constipation, pain near the anus) TENDERNESS IN MOUTH AND THROAT WITH OR WITHOUT PRESENCE OF ULCERS (sore throat, sores in mouth, or a toothache) UNUSUAL RASH, SWELLING OR PAIN  UNUSUAL VAGINAL DISCHARGE OR ITCHING   Items with * indicate a potential emergency and should be followed up as soon as possible or go to the Emergency Department if any problems should occur.  Please show the CHEMOTHERAPY ALERT CARD or IMMUNOTHERAPY ALERT CARD at check-in to the Emergency Department and triage  nurse.  Should you have questions after your visit or need to cancel or reschedule your appointment, please contact [facility] [phone]  and follow the prompts.  Office hours are 8:00 a.m. to 4:30 p.m. Monday - Friday. Please note that voicemails left after 4:00 p.m. may not be returned until the following business day.  We are closed weekends and major holidays. You have access to a nurse at all times for urgent questions. Please call the main number to the clinic [phone] and follow the prompts.  For any non-urgent questions, you may also contact your provider using MyChart. We now offer e-Visits for anyone 64 and older to request care online for non-urgent symptoms. For details visit [email or web address].   Also download the MyChart app! Go to the app store, search "MyChart", open the app, select [facility], and log in with your MyChart username and password.  Masks are optional in the cancer centers. If you would like for your care team to wear a mask while they are taking care of you, please let them know. You may have one support person who is at least 71 years old accompany you for your appointments.

## 2021-11-22 NOTE — Progress Notes (Signed)
Iron infusion given per orders. Patient tolerated it well without problems. Vitals stable and discharged home from clinic ambulatory. Follow up as scheduled.  

## 2021-11-22 NOTE — Progress Notes (Signed)
Patient presents today for Feraheme IV iron infusion. Vital signs are stable. Patient denies any changes since the last iron infusion. Patient denies any complaints today. MAR reviewed and updated.  Patient declines pre-meds . Flow sheet reviewed and no pre-meds given prior to last Feraheme on 11-15-2021.

## 2021-12-01 DIAGNOSIS — Z23 Encounter for immunization: Secondary | ICD-10-CM | POA: Diagnosis not present

## 2021-12-20 ENCOUNTER — Ambulatory Visit (INDEPENDENT_AMBULATORY_CARE_PROVIDER_SITE_OTHER): Payer: Medicare Other | Admitting: Pulmonary Disease

## 2021-12-20 ENCOUNTER — Encounter: Payer: Self-pay | Admitting: Pulmonary Disease

## 2021-12-20 ENCOUNTER — Ambulatory Visit (INDEPENDENT_AMBULATORY_CARE_PROVIDER_SITE_OTHER): Payer: Medicare Other

## 2021-12-20 VITALS — BP 122/66 | HR 74 | Temp 97.7°F | Ht 65.0 in | Wt 208.0 lb

## 2021-12-20 DIAGNOSIS — G4733 Obstructive sleep apnea (adult) (pediatric): Secondary | ICD-10-CM | POA: Diagnosis not present

## 2021-12-20 DIAGNOSIS — R0609 Other forms of dyspnea: Secondary | ICD-10-CM

## 2021-12-20 DIAGNOSIS — R0600 Dyspnea, unspecified: Secondary | ICD-10-CM | POA: Diagnosis not present

## 2021-12-20 LAB — CBC WITH DIFFERENTIAL/PLATELET
Basophils Absolute: 0 10*3/uL (ref 0.0–0.1)
Basophils Relative: 0.7 % (ref 0.0–3.0)
Eosinophils Absolute: 0.2 10*3/uL (ref 0.0–0.7)
Eosinophils Relative: 2.4 % (ref 0.0–5.0)
HCT: 35.8 % — ABNORMAL LOW (ref 36.0–46.0)
Hemoglobin: 11.4 g/dL — ABNORMAL LOW (ref 12.0–15.0)
Lymphocytes Relative: 27.5 % (ref 12.0–46.0)
Lymphs Abs: 1.8 10*3/uL (ref 0.7–4.0)
MCHC: 31.9 g/dL (ref 30.0–36.0)
MCV: 76.2 fl — ABNORMAL LOW (ref 78.0–100.0)
Monocytes Absolute: 0.5 10*3/uL (ref 0.1–1.0)
Monocytes Relative: 7.3 % (ref 3.0–12.0)
Neutro Abs: 4 10*3/uL (ref 1.4–7.7)
Neutrophils Relative %: 62.1 % (ref 43.0–77.0)
Platelets: 227 10*3/uL (ref 150.0–400.0)
RBC: 4.7 Mil/uL (ref 3.87–5.11)
RDW: 29.4 % — ABNORMAL HIGH (ref 11.5–15.5)
WBC: 6.4 10*3/uL (ref 4.0–10.5)

## 2021-12-20 LAB — COMPREHENSIVE METABOLIC PANEL
ALT: 15 U/L (ref 0–35)
AST: 15 U/L (ref 0–37)
Albumin: 4.2 g/dL (ref 3.5–5.2)
Alkaline Phosphatase: 59 U/L (ref 39–117)
BUN: 12 mg/dL (ref 6–23)
CO2: 31 mEq/L (ref 19–32)
Calcium: 9.7 mg/dL (ref 8.4–10.5)
Chloride: 102 mEq/L (ref 96–112)
Creatinine, Ser: 0.58 mg/dL (ref 0.40–1.20)
GFR: 90.96 mL/min (ref >60.00)
Glucose, Bld: 122 mg/dL — ABNORMAL HIGH (ref 70–99)
Potassium: 3.6 mEq/L (ref 3.5–5.1)
Sodium: 138 mEq/L (ref 135–145)
Total Bilirubin: 0.4 mg/dL (ref 0.2–1.2)
Total Protein: 6.9 g/dL (ref 6.0–8.3)

## 2021-12-20 LAB — POCT EXHALED NITRIC OXIDE: FeNO level (ppb): 10

## 2021-12-20 MED ORDER — FLUTICASONE-SALMETEROL 115-21 MCG/ACT IN AERO: 2.0000 | INHALATION_SPRAY | Freq: Two times a day (BID) | RESPIRATORY_TRACT | 12 refills | Status: DC

## 2021-12-20 NOTE — Progress Notes (Signed)
[facility]  Chief Complaint  Patient presents with   Follow-up    Discuss CPAP.  C/o SOB, dyspnea and wheezing.  Sx worsening over last several months.  COVID 01/2021.    Past Surgical History:  She  has a past surgical history that includes Partial hysterectomy (age 72s); Oophorectomy (age 56s); Cholecystectomy; Carpal tunnel release (Bilateral); thumb surger (Bilateral); Knee surgery; Cataract extraction; Colonoscopy (2005); Colonoscopy (2010); Colonoscopy (2014); Esophagogastroduodenoscopy (2010); Esophagogastroduodenoscopy (egd) with propofol (N/A, 10/26/2014); Esophageal dilation (N/A, 10/26/2014); Givens capsule study (N/A, 10/26/2014); Rectal exam under anesthesia (N/A, 10/26/2014); Hemorrhoid banding; Colonoscopy with propofol (N/A, 09/13/2015); and Esophagogastroduodenoscopy (egd) with propofol (N/A, 09/13/2015).  Past Medical History:  Colon polyps, Allergies, OA, Back pain, DM, Diverticulosis, Fibromyalgia, GERD, Glaucoma, IBS, Iron deficiency anemia, Neuropathy, Hiatal hernia, COVID December 2022  Constitutional:  BP 122/66 (BP Location: Right Arm, Patient Position: Sitting, Cuff Size: Large)   Pulse 74   Temp 97.7 F (36.5 C) (Oral)   Ht '5\' 5"'$  (1.651 m)   Wt 208 lb (94.3 kg)   SpO2 99%   BMI 34.61 kg/m   Brief Summary:  Debra Harris is a 71 y.o. female with obstructive sleep apnea and dyspnea.      Subjective:   She is here with her husband.  She had COVID in December 2022.  Since then she has noticed more trouble with her breathing.  She gets winded while walking more easily.  She also gets coughing spells and wheezing.  Her breathing feels especially heavy when she lays flat, even when she is wearing her CPAP.  She is not having fever, sputum, skin rash.  She has chronic leg swelling, but no worse than usual.  She uses her CPAP regularly.  No issues with mask fit.  Feels like this is working well.  Physical Exam:    Appearance - well kempt   ENMT - no sinus tenderness, no oral exudate, no LAN, Mallampati 3 airway, no stridor  Respiratory - equal breath sounds bilaterally, no wheezing or rales  CV - s1s2 regular rate and rhythm, no murmurs  Ext - no clubbing, no edema  Skin - no rashes  Psych - normal mood and affect   Pulmonary Tests:  FeNO 12/20/21 >> 10  Sleep Tests:  PSG 11/25/10 >> AHI 68.6, SpO2 low 81% Auto CPAP 11/19/21 to 12/18/21 >> used on 15 of 30 nights with average 9 hrs 10 min.  Average AHI 0.7 with median CPAP 8 and 95 th percentile CPAP 11 cm H2O  Social History:  She  reports that she has never smoked. She has never used smokeless tobacco. She reports that she does not currently use alcohol after a past usage of about 1.0 standard drink of alcohol per week. She reports that she does not use drugs.  Family History:  Her family history includes Depression in her mother; Diabetes in her mother; Glaucoma in her maternal grandmother and mother; Heart disease in her maternal grandfather, maternal grandmother, and mother; Obesity in her mother; Sudden death in her mother; Ulcers in her mother.     Assessment/Plan:   Dyspnea on exertion with cough and wheezing. - this has progressed since she had COVID 19 infection in December 2022 - will check CBC with diff, CMET, IgE, PFT, and chest xray - will have her try advair 115-21 hfa two puffs bid  Obstructive sleep apnea. - she is compliant with therapy and reports benefit from CPAP - she uses  [employer] for her DME - current CPAP ordered January 2023 - continue auto CPAP 5 to 12 cm H2O   Obesity. - discussed importance of weight loss  Time Spent Involved in Patient Care on Day of Examination:  38 minutes  Follow up:   Patient Instructions  Chest xray and lab work today  Advair two puffs in the morning and two puffs in the evening, and rinse your mouth after each use  Will arrange for pulmonary function test and follow up  with Dr. Halford Chessman or a nurse practitioner in 4 to 6 weeks  Medication List:   Allergies as of 12/20/2021       Reactions   Injectafer [ferric Carboxymaltose]    Has tolerated feraheme without problems        Medication List        Accurate as of December 20, 2021  3:05 PM. If you have any questions, ask your nurse or doctor.          Benefiber Powd Take by mouth daily.   escitalopram 10 MG tablet Commonly known as: LEXAPRO Take 10 mg by mouth daily.   fluticasone-salmeterol 115-21 MCG/ACT inhaler Commonly known as: Advair HFA Inhale 2 puffs into the lungs 2 (two) times daily. Started by: Chesley Mires, MD   guaiFENesin 200 MG tablet Take 400 mg by mouth daily.   HYDROcodone-acetaminophen 5-325 MG tablet Commonly known as: NORCO/VICODIN Take 1 tablet by mouth every 6 (six) hours as needed for moderate pain.   Lumigan 0.01 % Soln Generic drug: bimatoprost daily.   Magnesium 400 MG Tabs Take 1 tablet by mouth daily.   metFORMIN 500 MG tablet Commonly known as: GLUCOPHAGE Take 500 mg by mouth 2 (two) times daily with a meal.   MULTIVITAMIN ADULT PO Take 1 tablet by mouth daily.   naproxen sodium 220 MG tablet Commonly known as: ALEVE Take 220 mg by mouth.   NON FORMULARY CPAP at night   tiZANidine 4 MG tablet Commonly known as: ZANAFLEX TAKE 1 TABLET(4 MG) BY MOUTH DAILY What changed: See the new instructions.   Tylenol PM Extra Strength 25-500 MG Tabs tablet Generic drug: diphenhydramine-acetaminophen Take 1 tablet by mouth at bedtime as needed.        Signature:  Chesley Mires, MD [place] Pager - [phone] 12/20/2021, 3:05 PM

## 2021-12-20 NOTE — Patient Instructions (Signed)
Chest xray and lab work today  Advair two puffs in the morning and two puffs in the evening, and rinse your mouth after each use  Will arrange for pulmonary function test and follow up with Dr. Halford Chessman or a nurse practitioner in 4 to 6 weeks

## 2021-12-21 LAB — IGE: IgE (Immunoglobulin E), Serum: 66 kU/L (ref <=114)

## 2021-12-22 ENCOUNTER — Telehealth: Payer: Self-pay | Admitting: Pulmonary Disease

## 2021-12-22 DIAGNOSIS — J984 Other disorders of lung: Secondary | ICD-10-CM

## 2021-12-22 NOTE — Telephone Encounter (Signed)
Called and spoke to patient. She voiced understanding of results and is agreeable to HRCT. Patient states any time and day for CT to be done is okay with her, she is aware PCCs will call once CT is scheduled. Nothing further needed

## 2021-12-22 NOTE — Telephone Encounter (Signed)
DG Chest 2 View  Result Date: 12/20/2021 CLINICAL DATA:  71 year old female with dyspnea EXAM: CHEST - 2 VIEW COMPARISON:  None Available. FINDINGS: Cardiomediastinal silhouette within normal limits in size and contour. No evidence of central vascular congestion. No interlobular septal thickening. Reticular pattern of opacity of the lungs with bronchial wall thickening. No comparison. No pneumothorax or pleural effusion.  No confluent airspace disease. No acute displaced fracture. Degenerative changes of the spine. IMPRESSION: Bronchial wall thickening and reticular opacities may reflect bronchitis/bronchopneumonia. Electronically Signed   By: Corrie Mckusick D.O.   On: 12/20/2021 16:49      Tried calling patient.  Please let her know her chest xray shows scarring that is likely an after effect from COVID.  Please schedule for high resolution CT chest to assess further.

## 2021-12-29 ENCOUNTER — Telehealth: Payer: Self-pay | Admitting: Pulmonary Disease

## 2021-12-30 MED ORDER — FLUTICASONE-SALMETEROL 115-21 MCG/ACT IN AERO: 2.0000 | INHALATION_SPRAY | Freq: Two times a day (BID) | RESPIRATORY_TRACT | 12 refills | Status: DC

## 2021-12-30 NOTE — Telephone Encounter (Signed)
Call and spoke to patient and advised her that I was printed off the prescription for her and that she will pick it up at the office. Nothing further needed

## 2021-12-30 NOTE — Telephone Encounter (Signed)
Okay to provide her with printed script for fluticasone-salmeterol 115-21 two puffs bid with 5 refills.

## 2022-01-17 ENCOUNTER — Inpatient Hospital Stay: Payer: Medicare Other | Attending: Physician Assistant

## 2022-01-17 DIAGNOSIS — E559 Vitamin D deficiency, unspecified: Secondary | ICD-10-CM | POA: Insufficient documentation

## 2022-01-17 DIAGNOSIS — K909 Intestinal malabsorption, unspecified: Secondary | ICD-10-CM | POA: Insufficient documentation

## 2022-01-17 DIAGNOSIS — K922 Gastrointestinal hemorrhage, unspecified: Secondary | ICD-10-CM | POA: Diagnosis not present

## 2022-01-17 DIAGNOSIS — D500 Iron deficiency anemia secondary to blood loss (chronic): Secondary | ICD-10-CM | POA: Diagnosis not present

## 2022-01-17 LAB — IRON AND TIBC
Iron: 77 ug/dL (ref 28–170)
Saturation Ratios: 16 % (ref 10.4–31.8)
TIBC: 470 ug/dL — ABNORMAL HIGH (ref 250–450)
UIBC: 393 ug/dL

## 2022-01-17 LAB — CBC WITH DIFFERENTIAL/PLATELET
Abs Immature Granulocytes: 0.01 10*3/uL (ref 0.00–0.07)
Basophils Absolute: 0 10*3/uL (ref 0.0–0.1)
Basophils Relative: 1 %
Eosinophils Absolute: 0.1 10*3/uL (ref 0.0–0.5)
Eosinophils Relative: 3 %
HCT: 37.2 % (ref 36.0–46.0)
Hemoglobin: 11.6 g/dL — ABNORMAL LOW (ref 12.0–15.0)
Immature Granulocytes: 0 %
Lymphocytes Relative: 26 %
Lymphs Abs: 1.4 10*3/uL (ref 0.7–4.0)
MCH: 25.6 pg — ABNORMAL LOW (ref 26.0–34.0)
MCHC: 31.2 g/dL (ref 30.0–36.0)
MCV: 81.9 fL (ref 80.0–100.0)
Monocytes Absolute: 0.4 10*3/uL (ref 0.1–1.0)
Monocytes Relative: 7 %
Neutro Abs: 3.4 10*3/uL (ref 1.7–7.7)
Neutrophils Relative %: 63 %
Platelets: 247 10*3/uL (ref 150–400)
RBC: 4.54 MIL/uL (ref 3.87–5.11)
RDW: 22.4 % — ABNORMAL HIGH (ref 11.5–15.5)
WBC: 5.3 10*3/uL (ref 4.0–10.5)
nRBC: 0 % (ref 0.0–0.2)

## 2022-01-17 LAB — FERRITIN: Ferritin: 17 ng/mL (ref 11–307)

## 2022-01-17 LAB — VITAMIN D 25 HYDROXY (VIT D DEFICIENCY, FRACTURES): Vit D, 25-Hydroxy: 25.71 ng/mL — ABNORMAL LOW (ref 30–100)

## 2022-01-23 NOTE — Progress Notes (Signed)
Virtual Visit via Telephone Note [facility]  I connected with Debra Harris  on 01/24/22 at  2:24 PM by telephone and verified that I am speaking with the correct person using two identifiers.  Location: Patient: Home Provider: [facility]   I discussed the limitations, risks, security and privacy concerns of performing an evaluation and management service by telephone and the availability of in person appointments. I also discussed with the patient that there may be a patient responsible charge related to this service. The patient expressed understanding and agreed to proceed.  REASON FOR VISIT:  Follow-up for iron deficiency anemia   CURRENT THERAPY: Intermittent IV iron   INTERVAL HISTORY:  [name] 71 y.o. female returns for routine follow-up of her iron deficiency anemia.  She was last seen by Tarri Abernethy PA-C on 11/14/2021.  She last received IV iron with Feraheme x2 on 11/15/2021 and 11/22/2021.   She reports feeling improved energy after her IV iron in September 2023, started feeling tired again over the past month.  She reports that her rectal bleeding occurs intermittently in "clusters," but that she can go several weeks in between episodes.  She follows with GI (Dr. Gala Romney) on an as-needed basis.  She reports fatigue, headaches, and dyspnea on exertion.  No pica, restless legs, chest pain, lightheadedness, or syncope.  She is unable to tolerate iron tablet due to constipation that worsens her rectal bleeding.  She is taking vitamin D daily.   She has little to no energy and 50% appetite. She endorses that she is maintaining a stable weight.    OBSERVATIONS/OBJECTIVE: Review of Systems  Constitutional:  Positive for malaise/fatigue. Negative for chills, diaphoresis, fever and weight loss.  Respiratory:  Positive for cough and shortness of breath.   Cardiovascular:  Positive for leg swelling. Negative for chest pain and palpitations.   Gastrointestinal:  Negative for abdominal pain, blood in stool, melena, nausea and vomiting.  Musculoskeletal:  Positive for back pain, joint pain and myalgias.  Neurological:  Positive for dizziness, tingling and headaches.  Endo/Heme/Allergies:  Bruises/bleeds easily (bruising).     PHYSICAL EXAM (per limitations of virtual telephone visit): The patient is alert and oriented x 3, exhibiting adequate mentation, good mood, and ability to speak in full sentences and execute sound judgement.   ASSESSMENT & PLAN: 1.  Iron deficiency anemia: - Iron deficiency state from combination of malabsorption and chronic GI loss. - Colonoscopy on 09/13/2015 showing diverticuli in the sigmoid colon, descending colon and transverse colon, internal and external hemorrhoids banded.  - Last received IV iron with Feraheme x2 on 11/15/2021 and 11/22/2021 - She continues to have on and off rectal bleeding, thought to be secondary to internal hemorrhoids - Symptomatic with fatigue and dyspnea on exertion - Most recent labs (01/17/2022): Hgb 11.6/MCV 81.9, ferritin 17, iron saturation 16% with TIBC 470 - PLAN: Recommend additional IV Feraheme x2 with repeat labs and phone visit in 3 months.  2.  Vitamin D deficiency: - Vitamin D level was 19.96 (January 2022), and she was recommended to start taking vitamin D 2000 to 5000 units daily -- Most recent Vitamin D 25.71 on 01/17/22 - She is currently taking vitamin D 2000 daily - PLAN: Recommend vitamin D increased to 4000 units daily.  Will recheck vitamin D in 3 months.     PLAN SUMMARY: >> IV Feraheme x 2 >> Labs in 3 months (CBC/D, ferritin, iron/TIBC, vitamin D) >> PHONE visit 1 week  after labs    I discussed the assessment and treatment plan with the patient. The patient was provided an opportunity to ask questions and all were answered. The patient agreed with the plan and demonstrated an understanding of the instructions.   The patient was advised to call  back or seek an in-person evaluation if the symptoms worsen or if the condition fails to improve as anticipated.  I provided 21 minutes of non-face-to-face time during this encounter.   Harriett Rush, PA-C 01/24/2022 4:43 PM

## 2022-01-24 ENCOUNTER — Inpatient Hospital Stay (HOSPITAL_BASED_OUTPATIENT_CLINIC_OR_DEPARTMENT_OTHER): Payer: Medicare Other | Admitting: Physician Assistant

## 2022-01-24 ENCOUNTER — Encounter: Payer: Self-pay | Admitting: Physician Assistant

## 2022-01-24 VITALS — Wt 200.0 lb

## 2022-01-24 DIAGNOSIS — E559 Vitamin D deficiency, unspecified: Secondary | ICD-10-CM

## 2022-01-24 DIAGNOSIS — D500 Iron deficiency anemia secondary to blood loss (chronic): Secondary | ICD-10-CM

## 2022-01-25 ENCOUNTER — Other Ambulatory Visit: Payer: Self-pay

## 2022-01-25 DIAGNOSIS — D500 Iron deficiency anemia secondary to blood loss (chronic): Secondary | ICD-10-CM

## 2022-01-25 DIAGNOSIS — E559 Vitamin D deficiency, unspecified: Secondary | ICD-10-CM

## 2022-01-26 ENCOUNTER — Inpatient Hospital Stay: Payer: Medicare Other

## 2022-01-26 VITALS — BP 124/68 | HR 69 | Temp 97.4°F | Resp 18

## 2022-01-26 DIAGNOSIS — K909 Intestinal malabsorption, unspecified: Secondary | ICD-10-CM | POA: Diagnosis not present

## 2022-01-26 DIAGNOSIS — K922 Gastrointestinal hemorrhage, unspecified: Secondary | ICD-10-CM | POA: Diagnosis not present

## 2022-01-26 DIAGNOSIS — K625 Hemorrhage of anus and rectum: Secondary | ICD-10-CM

## 2022-01-26 DIAGNOSIS — D500 Iron deficiency anemia secondary to blood loss (chronic): Secondary | ICD-10-CM

## 2022-01-26 DIAGNOSIS — E559 Vitamin D deficiency, unspecified: Secondary | ICD-10-CM | POA: Diagnosis not present

## 2022-01-26 DIAGNOSIS — E860 Dehydration: Secondary | ICD-10-CM

## 2022-01-26 MED ORDER — SODIUM CHLORIDE 0.9 % IV SOLN: INTRAVENOUS | Status: DC

## 2022-01-26 MED ORDER — SODIUM CHLORIDE 0.9 % IV SOLN: Freq: Once | INTRAVENOUS | Status: AC

## 2022-01-26 MED ORDER — SODIUM CHLORIDE 0.9 % IV SOLN
510.0000 mg | Freq: Once | INTRAVENOUS | Status: AC
  Administered 2022-01-26: 510 mg via INTRAVENOUS
  Filled 2022-01-26: qty 17

## 2022-01-26 NOTE — Progress Notes (Signed)
Patient reports she does not want to take tylenol and claritin that she does fine without it, pharmacy aware. Patient tolerated iron infusion and 500 ml of normal saline with no complaints voiced. Peripheral IV site clean and dry with good blood return noted before and after infusion. Band aid applied. VSS with discharge and left in satisfactory condition with no s/s of distress noted.

## 2022-01-26 NOTE — Patient Instructions (Signed)
[facility]  Discharge Instructions: Thank you for choosing [facility] to provide your oncology and hematology care.  If you have a lab appointment with the [facility], please come in thru the Main Entrance and check in at the main information desk.  Wear comfortable clothing and clothing appropriate for easy access to any Portacath or PICC line.   We strive to give you quality time with your provider. You may need to reschedule your appointment if you arrive late (15 or more minutes).  Arriving late affects you and other patients whose appointments are after yours.  Also, if you miss three or more appointments without notifying the office, you may be dismissed from the clinic at the provider's discretion.      For prescription refill requests, have your pharmacy contact our office and allow 72 hours for refills to be completed.    Today you received the following Feraheme and 53m of normal saline.   To help prevent nausea and vomiting after your treatment, we encourage you to take your nausea medication as directed.  BELOW ARE SYMPTOMS THAT SHOULD BE REPORTED IMMEDIATELY: *FEVER GREATER THAN 100.4 F (38 C) OR HIGHER *CHILLS OR SWEATING *NAUSEA AND VOMITING THAT IS NOT CONTROLLED WITH YOUR NAUSEA MEDICATION *UNUSUAL SHORTNESS OF BREATH *UNUSUAL BRUISING OR BLEEDING *URINARY PROBLEMS (pain or burning when urinating, or frequent urination) *BOWEL PROBLEMS (unusual diarrhea, constipation, pain near the anus) TENDERNESS IN MOUTH AND THROAT WITH OR WITHOUT PRESENCE OF ULCERS (sore throat, sores in mouth, or a toothache) UNUSUAL RASH, SWELLING OR PAIN  UNUSUAL VAGINAL DISCHARGE OR ITCHING   Items with * indicate a potential emergency and should be followed up as soon as possible or go to the Emergency Department if any problems should occur.  Please show the CHEMOTHERAPY ALERT CARD or IMMUNOTHERAPY ALERT CARD at check-in to the Emergency Department and  triage nurse.  Should you have questions after your visit or need to cancel or reschedule your appointment, please contact [phone] and follow the prompts.  Office hours are 8:00 a.m. to 4:30 p.m. Monday - Friday. Please note that voicemails left after 4:00 p.m. may not be returned until the following business day.  We are closed weekends and major holidays. You have access to a nurse at all times for urgent questions. Please call the main number to the clinic [phone] follow the prompts.  For any non-urgent questions, you may also contact your provider using MyChart. We now offer e-Visits for anyone 171and older to request care online for non-urgent symptoms. For details visit [email or web address]   Also download the MyChart app! Go to the app store, search "MyChart", open the app, select [facility], and log in with your MyChart username and password.  Masks are optional in the cancer centers. If you would like for your care team to wear a mask while they are taking care of you, please let them know. You may have one support person who is at least 71years old accompany you for your appointments.

## 2022-02-02 ENCOUNTER — Inpatient Hospital Stay: Payer: Medicare Other | Attending: Physician Assistant

## 2022-02-02 ENCOUNTER — Ambulatory Visit (HOSPITAL_COMMUNITY)
Admission: RE | Admit: 2022-02-02 | Discharge: 2022-02-02 | Disposition: A | Discharge status: Home / Self Care | Payer: Medicare Other | Source: Ambulatory Visit | Attending: Pulmonary Disease | Admitting: Pulmonary Disease

## 2022-02-02 VITALS — BP 136/57 | HR 79 | Temp 98.1°F | Resp 18

## 2022-02-02 DIAGNOSIS — K625 Hemorrhage of anus and rectum: Secondary | ICD-10-CM

## 2022-02-02 DIAGNOSIS — D500 Iron deficiency anemia secondary to blood loss (chronic): Secondary | ICD-10-CM | POA: Diagnosis not present

## 2022-02-02 DIAGNOSIS — R0602 Shortness of breath: Secondary | ICD-10-CM | POA: Diagnosis not present

## 2022-02-02 DIAGNOSIS — J984 Other disorders of lung: Secondary | ICD-10-CM | POA: Insufficient documentation

## 2022-02-02 DIAGNOSIS — K922 Gastrointestinal hemorrhage, unspecified: Secondary | ICD-10-CM | POA: Diagnosis not present

## 2022-02-02 DIAGNOSIS — M5134 Other intervertebral disc degeneration, thoracic region: Secondary | ICD-10-CM | POA: Diagnosis not present

## 2022-02-02 DIAGNOSIS — K909 Intestinal malabsorption, unspecified: Secondary | ICD-10-CM | POA: Diagnosis not present

## 2022-02-02 DIAGNOSIS — R059 Cough, unspecified: Secondary | ICD-10-CM | POA: Diagnosis not present

## 2022-02-02 DIAGNOSIS — I700 Atherosclerosis of aorta: Secondary | ICD-10-CM | POA: Diagnosis not present

## 2022-02-02 MED ORDER — LORATADINE 10 MG PO TABS
10.0000 mg | ORAL_TABLET | Freq: Once | ORAL | Status: DC
  Filled 2022-02-02: qty 1

## 2022-02-02 MED ORDER — SODIUM CHLORIDE 0.9 % IV SOLN
510.0000 mg | Freq: Once | INTRAVENOUS | Status: AC
  Administered 2022-02-02: 510 mg via INTRAVENOUS
  Filled 2022-02-02: qty 510

## 2022-02-02 MED ORDER — ACETAMINOPHEN 325 MG PO TABS
650.0000 mg | ORAL_TABLET | Freq: Once | ORAL | Status: DC
  Filled 2022-02-02: qty 2

## 2022-02-02 MED ORDER — SODIUM CHLORIDE 0.9 % IV SOLN: Freq: Once | INTRAVENOUS | Status: AC

## 2022-02-02 NOTE — Progress Notes (Signed)
Patient presents today for Feraheme infusion per providers order.  Vital signs WNL.  Patient states that she does not take premedications prior to her iron infusion and refused them.  Patient has no new complaints at this time.    Peripheral IV started and blood return noted pre and post infusion.  Stable during infusion without adverse affects.  Vital signs stable.  No complaints at this time.  Discharge from clinic ambulatory in stable condition.  Alert and oriented X 3.  Follow up with [facility] as scheduled.

## 2022-02-02 NOTE — Patient Instructions (Signed)
[facility]  Discharge Instructions: Thank you for choosing [facility] to provide your oncology and hematology care.  If you have a lab appointment with the Cancer Center, please come in thru the Main Entrance and check in at the main information desk.  Wear comfortable clothing and clothing appropriate for easy access to any Portacath or PICC line.   We strive to give you quality time with your provider. You may need to reschedule your appointment if you arrive late (15 or more minutes).  Arriving late affects you and other patients whose appointments are after yours.  Also, if you miss three or more appointments without notifying the office, you may be dismissed from the clinic at the provider's discretion.      For prescription refill requests, have your pharmacy contact our office and allow 72 hours for refills to be completed.    Today you received the following chemotherapy and/or immunotherapy agents Feraheme      To help prevent nausea and vomiting after your treatment, we encourage you to take your nausea medication as directed.  BELOW ARE SYMPTOMS THAT SHOULD BE REPORTED IMMEDIATELY: *FEVER GREATER THAN 100.4 F (38 C) OR HIGHER *CHILLS OR SWEATING *NAUSEA AND VOMITING THAT IS NOT CONTROLLED WITH YOUR NAUSEA MEDICATION *UNUSUAL SHORTNESS OF BREATH *UNUSUAL BRUISING OR BLEEDING *URINARY PROBLEMS (pain or burning when urinating, or frequent urination) *BOWEL PROBLEMS (unusual diarrhea, constipation, pain near the anus) TENDERNESS IN MOUTH AND THROAT WITH OR WITHOUT PRESENCE OF ULCERS (sore throat, sores in mouth, or a toothache) UNUSUAL RASH, SWELLING OR PAIN  UNUSUAL VAGINAL DISCHARGE OR ITCHING   Items with * indicate a potential emergency and should be followed up as soon as possible or go to the Emergency Department if any problems should occur.  Please show the CHEMOTHERAPY ALERT CARD or IMMUNOTHERAPY ALERT CARD at check-in to the  Emergency Department and triage nurse.  Should you have questions after your visit or need to cancel or reschedule your appointment, please contact [facility] [phone]  and follow the prompts.  Office hours are 8:00 a.m. to 4:30 p.m. Monday - Friday. Please note that voicemails left after 4:00 p.m. may not be returned until the following business day.  We are closed weekends and major holidays. You have access to a nurse at all times for urgent questions. Please call the main number to the clinic [phone] and follow the prompts.  For any non-urgent questions, you may also contact your provider using MyChart. We now offer e-Visits for anyone 18 and older to request care online for non-urgent symptoms. For details visit [email or web address].   Also download the MyChart app! Go to the app store, search "MyChart", open the app, select [facility], and log in with your MyChart username and password.  Masks are optional in the cancer centers. If you would like for your care team to wear a mask while they are taking care of you, please let them know. You may have one support person who is at least 71 years old accompany you for your appointments.  

## 2022-02-06 ENCOUNTER — Ambulatory Visit (INDEPENDENT_AMBULATORY_CARE_PROVIDER_SITE_OTHER): Payer: Medicare Other | Admitting: Primary Care

## 2022-02-06 ENCOUNTER — Encounter: Payer: Self-pay | Admitting: Primary Care

## 2022-02-06 ENCOUNTER — Ambulatory Visit (INDEPENDENT_AMBULATORY_CARE_PROVIDER_SITE_OTHER): Payer: Medicare Other | Admitting: Pulmonary Disease

## 2022-02-06 VITALS — BP 130/76 | HR 84 | Temp 98.6°F | Ht 65.0 in | Wt 204.0 lb

## 2022-02-06 DIAGNOSIS — R0609 Other forms of dyspnea: Secondary | ICD-10-CM | POA: Diagnosis not present

## 2022-02-06 DIAGNOSIS — R600 Localized edema: Secondary | ICD-10-CM

## 2022-02-06 DIAGNOSIS — R0602 Shortness of breath: Secondary | ICD-10-CM | POA: Diagnosis not present

## 2022-02-06 DIAGNOSIS — G4733 Obstructive sleep apnea (adult) (pediatric): Secondary | ICD-10-CM

## 2022-02-06 LAB — PULMONARY FUNCTION TEST
DL/VA % pred: 114 %
DL/VA: 4.71 ml/min/mmHg/L
DLCO cor % pred: 98 %
DLCO cor: 19.85 ml/min/mmHg
DLCO unc % pred: 93 %
DLCO unc: 18.66 ml/min/mmHg
FEF 25-75 Post: 2.86 L/sec
FEF 25-75 Pre: 2.87 L/sec
FEF2575-%Change-Post: 0 %
FEF2575-%Pred-Post: 149 %
FEF2575-%Pred-Pre: 150 %
FEV1-%Change-Post: 0 %
FEV1-%Pred-Post: 95 %
FEV1-%Pred-Pre: 95 %
FEV1-Post: 2.23 L
FEV1-Pre: 2.22 L
FEV1FVC-%Change-Post: -1 %
FEV1FVC-%Pred-Pre: 113 %
FEV6-%Change-Post: 2 %
FEV6-%Pred-Post: 89 %
FEV6-%Pred-Pre: 87 %
FEV6-Post: 2.65 L
FEV6-Pre: 2.59 L
FEV6FVC-%Pred-Post: 104 %
FEV6FVC-%Pred-Pre: 104 %
FVC-%Change-Post: 2 %
FVC-%Pred-Post: 85 %
FVC-%Pred-Pre: 84 %
FVC-Post: 2.65 L
FVC-Pre: 2.59 L
Post FEV1/FVC ratio: 84 %
Post FEV6/FVC ratio: 100 %
Pre FEV1/FVC ratio: 86 %
Pre FEV6/FVC Ratio: 100 %
RV % pred: 91 %
RV: 2.07 L
TLC % pred: 96 %
TLC: 5.03 L

## 2022-02-06 NOTE — Progress Notes (Signed)
Reviewed and agree with assessment/plan.   Chesley Mires, MD [facility] Pulmonary/Critical Care 02/06/2022, 4:57 PM Pager:  [phone]

## 2022-02-06 NOTE — Patient Instructions (Signed)
Full PFT performed today. °

## 2022-02-06 NOTE — Progress Notes (Signed)
Full PFT performed today. °

## 2022-02-06 NOTE — Assessment & Plan Note (Addendum)
-   Shortness of breath has been worse since covid infection in December 2022.  She had back surgery in June, deconditioning likely contributing to dyspnea symptoms.  Pulmonary function testing today showed normal spirometry and diffusion capacity.  HRCT without evidence of interstitial lung disease.  She reports no clinical benefit from trial of medium dose ICS/LAMA.  Needs further workup for dyspnea. Ordering BNP. Ok to discontinue Advair. Referring to cardiology due to history of aortic arthrosclerosis and leg swelling on exam.  Recommend patient increase activity level.

## 2022-02-06 NOTE — Progress Notes (Signed)
$'@Patient'W$  ID: Garnette Gunner, female    DOB: [birth year], 71 y.o.   MRN: [id number]  Chief Complaint  Patient presents with   Follow-up    PFT 02/06/2022 DOE CPAP [facility]     Referring provider: Asencion Noble, MD  HPI: 71 year old female, never smoked.  Past medical history significant OSA, hiatal hernia, GERD, type 2 diabetes, B12 deficiency. Patient of Dr. Halford Chessman, last seen in October 2023.  Previous [facility] pulmonary encounter: Dyspnea on exertion with cough and wheezing. - this has progressed since she had COVID 19 infection in December 2022 - will check CBC with diff, CMET, IgE, PFT, and chest xray - will have her try advair 115-21 hfa two puffs bid   Obstructive sleep apnea. - she is compliant with therapy and reports benefit from CPAP - she uses [facility] for her DME - current CPAP ordered January 2023 - continue auto CPAP 5 to 12 cm H2O   Obesity. - discussed importance of weight loss   02/06/2022- Interim hx  Patient presents today for follow-up visit/PFTs. Patient was seen by Dr. Halford Chessman in October for routine follow-up OSA. She reported progressive dyspnea symptoms with associated cough and wheeze since having COVID in December 2022.  She was given a trial of Advair 115-20 mcg 2 puffs twice daily.  High-resolution CT chest showed minimal scarring bilateral lung bases.  No evidence of fibrotic interstitial lung disease.  She had mild lobular air trapping consistent with small airway disease.  She is doing well today.  She continues to have dyspnea symptoms.  She saw no benefit with trial of Advair.  She has been relatively sedentary since having back surgery in June. Prior to this she was using stationary bike upwards of 20 miles.  She has recently restarted exercise.    Review download 01/07/2022 - 02/05/2022 Usage days 30/30 days (100%) greater than 4 hours Average usage 8 hours 36 minutes Pressure 5 to 12 cm H2O (11.2 cm H2O-95%) Air leaks 24.4 L/min (95%) AHI 0.6  Pulmonary  function testing 02/06/2022 PFTs >> FVC 2.65 (85%), FEV1 2.23 (95%), ratio 84, TLC 96%, DLCO cor 10.85 (98%)   Labs: 12/20/21 >> FENO 10; Eosinophils 200; IgE 66   Allergies  Allergen Reactions   Injectafer [Ferric Carboxymaltose]     Has tolerated feraheme without problems    Immunization History  Administered Date(s) Administered   Fluad Quad(high Dose 65+) 02/06/2020   Hepatitis A, Adult 02/01/2017   Influenza Split 12/28/2012, 11/27/2014, 11/22/2016   Influenza Whole 11/28/2011   Influenza,inj,Quad PF,6+ Mos 10/29/2013   Influenza-Unspecified 02/11/2016, 10/31/2021   PFIZER(Purple Top)SARS-COV-2 Vaccination 03/17/2019, 04/07/2019   Tdap 02/01/2017   Typhoid Live 02/01/2017    Past Medical History:  Diagnosis Date   Adenomatous colon polyp    Allergic rhinitis    Arthritis    Back pain    Constipation    Diabetes mellitus    Diverticulosis    External hemorrhoids    Fibromyalgia    Gallbladder problem    GERD (gastroesophageal reflux disease)    Glaucoma    Glaucoma    Hemorrhoids    grade 3    Hip pain    IBS (irritable bowel syndrome)    IDA (iron deficiency anemia)    Iron deficiency anemia 06/05/2014   Irregular heartbeat    Leg edema    Leg numbness    Leg pain    Low tension glaucoma    Neuropathy, leg    Ovarian cyst 11/20/00  Prediabetes    Sleep apnea    CPAP   Sliding hiatal hernia     Tobacco History: Social History   Tobacco Use  Smoking Status Never  Smokeless Tobacco Never  Tobacco Comments   Pt reports tried once in 7th grade//ms   Counseling given: Not Answered Tobacco comments: Pt reports tried once in 7th grade//ms   Outpatient Medications Prior to Visit  Medication Sig Dispense Refill   cyclobenzaprine (FLEXERIL) 5 MG tablet Take 5 mg by mouth 3 (three) times daily as needed for muscle spasms.     diphenhydramine-acetaminophen (TYLENOL PM EXTRA STRENGTH) 25-500 MG TABS tablet Take 1 tablet by mouth at bedtime as needed.      escitalopram (LEXAPRO) 10 MG tablet Take 10 mg by mouth daily.     fluticasone-salmeterol (ADVAIR HFA) 115-21 MCG/ACT inhaler Inhale 2 puffs into the lungs 2 (two) times daily. 1 each 12   guaiFENesin 200 MG tablet Take 400 mg by mouth daily.     HYDROcodone-acetaminophen (NORCO/VICODIN) 5-325 MG tablet Take 1 tablet by mouth every 6 (six) hours as needed for moderate pain. 30 tablet 0   LUMIGAN 0.01 % SOLN daily.     Magnesium 400 MG TABS Take 1 tablet by mouth daily.     metFORMIN (GLUCOPHAGE) 500 MG tablet Take 500 mg by mouth 2 (two) times daily with a meal.     Multiple Vitamins-Minerals (MULTIVITAMIN ADULT PO) Take 1 tablet by mouth daily.     naproxen sodium (ALEVE) 220 MG tablet Take 220 mg by mouth.     NON FORMULARY CPAP at night      Wheat Dextrin (BENEFIBER) POWD Take by mouth daily.     tiZANidine (ZANAFLEX) 4 MG tablet TAKE 1 TABLET(4 MG) BY MOUTH DAILY (Patient not taking: Reported on 02/06/2022) 30 tablet 1   No facility-administered medications prior to visit.    Review of Systems  Review of Systems  Constitutional: Negative.   HENT: Negative.    Respiratory:  Positive for shortness of breath. Negative for cough.   Cardiovascular:  Positive for leg swelling.   Physical Exam  BP 130/76 (BP Location: Left Arm, Patient Position: Sitting, Cuff Size: Normal)   Pulse 84   Temp 98.6 F (37 C) (Oral)   Ht '5\' 5"'$  (1.651 m)   Wt 204 lb (92.5 kg)   SpO2 99%   BMI 33.95 kg/m  Physical Exam Constitutional:      Appearance: Normal appearance.  HENT:     Head: Normocephalic and atraumatic.     Mouth/Throat:     Comments: Deferred d/t mask Cardiovascular:     Rate and Rhythm: Normal rate and regular rhythm.  Pulmonary:     Effort: Pulmonary effort is normal.     Breath sounds: Normal breath sounds. No wheezing, rhonchi or rales.  Musculoskeletal:        General: Normal range of motion.  Skin:    General: Skin is warm and dry.  Neurological:     General: No focal  deficit present.     Mental Status: She is alert and oriented to person, place, and time. Mental status is at baseline.  Psychiatric:        Mood and Affect: Mood normal.        Behavior: Behavior normal.        Thought Content: Thought content normal.        Judgment: Judgment normal.      Lab Results:  CBC  Component Value Date/Time   WBC 5.3 01/17/2022 0945   RBC 4.54 01/17/2022 0945   HGB 11.6 (L) 01/17/2022 0945   HCT 37.2 01/17/2022 0945   PLT 247 01/17/2022 0945   MCV 81.9 01/17/2022 0945   MCH 25.6 (L) 01/17/2022 0945   MCHC 31.2 01/17/2022 0945   RDW 22.4 (H) 01/17/2022 0945   LYMPHSABS 1.4 01/17/2022 0945   MONOABS 0.4 01/17/2022 0945   EOSABS 0.1 01/17/2022 0945   BASOSABS 0.0 01/17/2022 0945    BMET    Component Value Date/Time   NA 138 12/20/2021 1508   NA 144 03/19/2018 0000   K 3.6 12/20/2021 1508   CL 102 12/20/2021 1508   CO2 31 12/20/2021 1508   GLUCOSE 122 (H) 12/20/2021 1508   BUN 12 12/20/2021 1508   BUN 24 (A) 03/19/2018 0000   CREATININE 0.58 12/20/2021 1508   CALCIUM 9.7 12/20/2021 1508   GFRNONAA >60 09/27/2020 1515   GFRAA >60 10/09/2019 1415    BNP No results found for: "BNP"  ProBNP No results found for: "PROBNP"  Imaging: CT CHEST HIGH RESOLUTION  Result Date: 02/04/2022 CLINICAL DATA:  Chronic shortness of breath, cough known COVID, overt this follow-up abnormal radiograph on EXAM: CT CHEST WITHOUT CONTRAST TECHNIQUE: Multidetector CT imaging of the chest was performed following the standard protocol without intravenous contrast. High resolution imaging of the lungs, as well as inspiratory and expiratory imaging, was performed. RADIATION DOSE REDUCTION: This exam was performed according to the departmental dose-optimization program which includes automated exposure control, adjustment of the mA and/or kV according to patient size and/or use of iterative reconstruction technique. COMPARISON:  12/20/2021 FINDINGS: Cardiovascular:  Aortic atherosclerosis. Normal heart size. No pericardial effusion. Mediastinum/Nodes: No enlarged mediastinal, hilar, or axillary lymph nodes. Thyroid gland, trachea, and esophagus demonstrate no significant findings. Lungs/Pleura: Minimal, bland appearing scarring of the bilateral lung bases. No evidence of fibrotic interstitial lung disease. Mild, lobular air trapping on expiratory phase imaging. No pleural effusion or pneumothorax. Upper Abdomen: No acute abnormality. Musculoskeletal: No chest wall abnormality. No acute osseous findings. Disc degenerative disease and bridging osteophytosis throughout the thoracic spine, in keeping with DISH IMPRESSION: 1. Minimal, bland appearing scarring of the bilateral lung bases. No evidence of fibrotic interstitial lung disease. 2. Mild, lobular air trapping on expiratory phase imaging, consistent with small airways disease. 3. No acute airspace disease. Aortic Atherosclerosis (ICD10-I70.0). Electronically Signed   By: Delanna Ahmadi M.D.   On: 02/04/2022 18:06     Assessment & Plan:   OSA (obstructive sleep apnea) - Hx severe OSA, NPSG 10/2010 >> AHI 69/hour - Patient is 100% compliant with CPAP and reports benefit from use - Current pressure settings 5-12cm h20 without residual apneas  - No changes, continue follow-up with Dr. Halford Chessman    Shortness of breath on exertion - Shortness of breath has been worse since covid infection in December 2022.  She had back surgery in June, deconditioning likely contributing to dyspnea symptoms.  Pulmonary function testing today showed normal spirometry and diffusion capacity.  HRCT without evidence of interstitial lung disease.  She reports no clinical benefit from trial of medium dose ICS/LAMA.  Needs further workup for dyspnea. Ordering BNP. Ok to discontinue Advair. Referring to cardiology due to history of aortic arthrosclerosis and leg swelling on exam.  Recommend patient increase activity level.  40 mins spent on case:  > 50% face to face   Martyn Ehrich, NP 02/06/2022

## 2022-02-06 NOTE — Assessment & Plan Note (Signed)
-   Hx severe OSA, NPSG 10/2010 >> AHI 69/hour - Patient is 100% compliant with CPAP and reports benefit from use - Current pressure settings 5-12cm h20 without residual apneas  - No changes, continue follow-up with Dr. Halford Chessman

## 2022-02-06 NOTE — Patient Instructions (Addendum)
Pulmonary function testing today showed normal lung function  CT scan of your chest showed mild scarring, no evidence of interstitial lung disease  Dyspnea could be heart related, recommend we get a lab test called BNP to monitor fluid   Ok to stop Advair since no clinical improvement   Orders: BNP (orders)  Referral: Cardiology re: dyspnea/leg swelling (ordered)  Follow-up:  6 months with Dr. Halford Chessman

## 2022-02-07 LAB — BRAIN NATRIURETIC PEPTIDE: Pro B Natriuretic peptide (BNP): 36 pg/mL (ref 0.0–100.0)

## 2022-02-07 NOTE — Progress Notes (Signed)
Please let patient know BNP was normal. No further testing right now. See cardiology for eval dyspnea as discussed yesterday

## 2022-02-08 ENCOUNTER — Telehealth: Payer: Self-pay | Admitting: Primary Care

## 2022-02-08 NOTE — Telephone Encounter (Signed)
Pt was seen for an appt by [facility] 12/11. Referral to cardiology was placed at that appt. It seems like the referral went to Cardiology in [place]. Pt lives in [place] and wants to know if this could be sent to Cardiology in [place]. PCCs, please advise.

## 2022-02-08 NOTE — Telephone Encounter (Signed)
This has been flipped to [facility] they should be calling the patient

## 2022-02-08 NOTE — Telephone Encounter (Signed)
Noted  

## 2022-02-09 ENCOUNTER — Other Ambulatory Visit: Payer: Self-pay | Admitting: Radiology

## 2022-02-09 ENCOUNTER — Encounter: Payer: Self-pay | Admitting: Radiology

## 2022-02-09 DIAGNOSIS — G8929 Other chronic pain: Secondary | ICD-10-CM

## 2022-02-09 MED ORDER — CYCLOBENZAPRINE HCL 5 MG PO TABS: 5.0000 mg | ORAL_TABLET | Freq: Three times a day (TID) | ORAL | 0 refills | Status: DC | PRN

## 2022-02-09 MED ORDER — HYDROCODONE-ACETAMINOPHEN 5-325 MG PO TABS: 1.0000 | ORAL_TABLET | Freq: Four times a day (QID) | ORAL | 0 refills | Status: DC | PRN

## 2022-02-09 NOTE — Telephone Encounter (Signed)
Having a flare with back, cleaning out [place] and has overdone it, asking for refill of Hydrocodone and Flexeril to use prn [employer]

## 2022-02-13 DIAGNOSIS — E1140 Type 2 diabetes mellitus with diabetic neuropathy, unspecified: Secondary | ICD-10-CM | POA: Diagnosis not present

## 2022-02-14 ENCOUNTER — Ambulatory Visit: Payer: Medicare Other | Attending: Cardiology | Admitting: Cardiology

## 2022-02-14 ENCOUNTER — Encounter: Payer: Self-pay | Admitting: Cardiology

## 2022-02-14 VITALS — BP 138/76 | HR 60 | Ht 65.0 in | Wt 201.6 lb

## 2022-02-14 DIAGNOSIS — R0789 Other chest pain: Secondary | ICD-10-CM | POA: Insufficient documentation

## 2022-02-14 DIAGNOSIS — E1140 Type 2 diabetes mellitus with diabetic neuropathy, unspecified: Secondary | ICD-10-CM | POA: Diagnosis not present

## 2022-02-14 DIAGNOSIS — R0602 Shortness of breath: Secondary | ICD-10-CM | POA: Insufficient documentation

## 2022-02-14 DIAGNOSIS — F320 Major depressive disorder, single episode, mild: Secondary | ICD-10-CM | POA: Diagnosis not present

## 2022-02-14 DIAGNOSIS — I700 Atherosclerosis of aorta: Secondary | ICD-10-CM | POA: Diagnosis not present

## 2022-02-14 DIAGNOSIS — R7309 Other abnormal glucose: Secondary | ICD-10-CM | POA: Diagnosis not present

## 2022-02-14 NOTE — Progress Notes (Signed)
Clinical Summary Debra Harris is a 71 y.o.female last seen by Dr Bronson Ing in 01/2018, this is our first visit together. Seen for the following medical problems.  1.DOE/LE edema - followed by pulmonary - from notes progressive symptoms since COVID 01/2021 - High-resolution CT chest showed minimal scarring bilateral lung bases. No evidence of fibrotic interstitial lung disease. She had mild lobular air trapping consistent with small airway disease.  01/2022 BNP 36   - SOB can occur at any time. Sporadic no specific pattern - sedentary lifestyle since back surgery in June   - recent chest pains started beginning of this year. Midchest, can occur at rest or with activity. Feeling of heaviness on chest, can have some associated SOB, occasional pain left or right arm. 5-6/10 in severity, can sometimes be positional. Unsure of duration.    - She underwent a low risk nuclear stress test on 01/03/2018 with no evidence of myocardial ischemia or scar, LVEF 68%.  - Echocardiogram on 12/31/2017 demonstrated normal left ventricular systolic and diastolic function and normal regional wall motion, LVEF 60 to 65%. There was moderate focal basal septal hypertrophy and mild tricuspid regurgitation.         Soc Hx: Her husband is Dr. Sanjuana Kava, an orthopedic surgeon   Past Medical History:  Diagnosis Date   Adenomatous colon polyp    Allergic rhinitis    Arthritis    Back pain    Constipation    Diabetes mellitus    Diverticulosis    External hemorrhoids    Fibromyalgia    Gallbladder problem    GERD (gastroesophageal reflux disease)    Glaucoma    Glaucoma    Hemorrhoids    grade 3    Hip pain    IBS (irritable bowel syndrome)    IDA (iron deficiency anemia)    Iron deficiency anemia 06/05/2014   Irregular heartbeat    Leg edema    Leg numbness    Leg pain    Low tension glaucoma    Neuropathy, leg    Ovarian cyst 11/20/00   Prediabetes    Sleep apnea    CPAP    Sliding hiatal hernia      Allergies  Allergen Reactions   Injectafer [Ferric Carboxymaltose]     Has tolerated feraheme without problems   Tizanidine Swelling    Tongue swelling      Current Outpatient Medications  Medication Sig Dispense Refill   cyclobenzaprine (FLEXERIL) 5 MG tablet Take 1 tablet (5 mg total) by mouth 3 (three) times daily as needed for muscle spasms. 30 tablet 0   diphenhydramine-acetaminophen (TYLENOL PM EXTRA STRENGTH) 25-500 MG TABS tablet Take 1 tablet by mouth at bedtime as needed.     escitalopram (LEXAPRO) 10 MG tablet Take 10 mg by mouth daily.     fluticasone-salmeterol (ADVAIR HFA) 115-21 MCG/ACT inhaler Inhale 2 puffs into the lungs 2 (two) times daily. 1 each 12   guaiFENesin 200 MG tablet Take 400 mg by mouth daily.     HYDROcodone-acetaminophen (NORCO/VICODIN) 5-325 MG tablet Take 1 tablet by mouth every 6 (six) hours as needed for moderate pain. 30 tablet 0   LUMIGAN 0.01 % SOLN daily.     Magnesium 400 MG TABS Take 1 tablet by mouth daily.     metFORMIN (GLUCOPHAGE) 500 MG tablet Take 500 mg by mouth 2 (two) times daily with a meal.     Multiple Vitamins-Minerals (MULTIVITAMIN ADULT PO) Take 1 tablet  by mouth daily.     naproxen sodium (ALEVE) 220 MG tablet Take 220 mg by mouth.     NON FORMULARY CPAP at night      Wheat Dextrin (BENEFIBER) POWD Take by mouth daily.     No current facility-administered medications for this visit.     Past Surgical History:  Procedure Laterality Date   CARPAL TUNNEL RELEASE Bilateral    CATARACT EXTRACTION     bilateral   CHOLECYSTECTOMY     COLONOSCOPY  2005   Small external hemorrhoids, single sigmoid colon diverticulum, small polyp at the hepatic flexure focally adenomatous.   COLONOSCOPY  2010   Dr. Gala Romney, Versed 4/Demerol 75. Pancolonic diverticula, external hemorrhoids. Colonoscopy every 5 years for history of adenomatous polyps   COLONOSCOPY  2014   Dr. Olevia Perches: Propofol. mild diverticulosis in  the sigmoid colon, random colon biopsies benign.   COLONOSCOPY WITH PROPOFOL N/A 09/13/2015   Procedure: COLONOSCOPY WITH PROPOFOL;  Surgeon: Daneil Dolin, MD;  Location: [facility];  Service: Endoscopy;  Laterality: N/A;  1030   ESOPHAGEAL DILATION N/A 10/26/2014   Procedure: ESOPHAGEAL DILATION [place];  Surgeon: Daneil Dolin, MD;  Location: [facility];  Service: Endoscopy;  Laterality: N/A;   ESOPHAGOGASTRODUODENOSCOPY  2010   Dr. Gala Romney, distal esophageal erosions, Venia Minks dilator passed. Comment small hiatal hernia, patulous EG junction.   ESOPHAGOGASTRODUODENOSCOPY (EGD) WITH PROPOFOL N/A 10/26/2014   Dr.Rourk- distal esophageal erosions c/w mild erosive reflux esophagitis. hiatal hernia, o/w normal EGD   ESOPHAGOGASTRODUODENOSCOPY (EGD) WITH PROPOFOL N/A 09/13/2015   Procedure: ESOPHAGOGASTRODUODENOSCOPY (EGD) WITH PROPOFOL;  Surgeon: Daneil Dolin, MD;  Location: [facility];  Service: Endoscopy;  Laterality: N/A;   GIVENS CAPSULE STUDY N/A 10/26/2014   couple of mucosal breaks/erosions but essentially unremarkable small bowel study   HEMORRHOID BANDING     KNEE SURGERY     right-arthroscopy   OOPHORECTOMY  age 56s   PARTIAL HYSTERECTOMY  age 46s   RECTAL EXAM UNDER ANESTHESIA N/A 10/26/2014   circumferential grade 3 hemoorrhoids easily reducible   thumb surger Bilateral    trigger thumb     Allergies  Allergen Reactions   Injectafer [Ferric Carboxymaltose]     Has tolerated feraheme without problems   Tizanidine Swelling    Tongue swelling       Family History  Problem Relation Age of Onset   Ulcers Mother    Glaucoma Mother    Heart disease Mother    Diabetes Mother    Depression Mother    Sudden death Mother    Obesity Mother    Glaucoma Maternal Grandmother    Heart disease Maternal Grandmother    Heart disease Maternal Grandfather    Colon cancer Neg Hx      Social History Debra Harris reports that she has never smoked. She has never used smokeless  tobacco. Debra Harris reports that she does not currently use alcohol after a past usage of about 1.0 standard drink of alcohol per week.   Review of Systems CONSTITUTIONAL: No weight loss, fever, chills, weakness or fatigue.  HEENT: Eyes: No visual loss, blurred vision, double vision or yellow sclerae.No hearing loss, sneezing, congestion, runny nose or sore throat.  SKIN: No rash or itching.  CARDIOVASCULAR: per hpi RESPIRATORY: per hpi GASTROINTESTINAL: No anorexia, nausea, vomiting or diarrhea. No abdominal pain or blood.  GENITOURINARY: No burning on urination, no polyuria NEUROLOGICAL: No headache, dizziness, syncope, paralysis, ataxia, numbness or tingling in the extremities. No change  in bowel or bladder control.  MUSCULOSKELETAL: No muscle, back pain, joint pain or stiffness.  LYMPHATICS: No enlarged nodes. No history of splenectomy.  PSYCHIATRIC: No history of depression or anxiety.  ENDOCRINOLOGIC: No reports of sweating, cold or heat intolerance. No polyuria or polydipsia.  Marland Kitchen   Physical Examination Today's Vitals   02/14/22 1312  BP: 138/76  Pulse: 60  SpO2: 99%  Weight: 201 lb 9.6 oz (91.4 kg)  Height: '5\' 5"'$  (1.651 m)   Body mass index is 33.55 kg/m.  Gen: resting comfortably, no acute distress HEENT: no scleral icterus, pupils equal round and reactive, no palptable cervical adenopathy,  CV: RRR, no m/r/g no jvd Resp: Clear to auscultation bilaterally GI: abdomen is soft, non-tender, non-distended, normal bowel sounds, no hepatosplenomegaly MSK: extremities are warm, no edema.  Skin: warm, no rash Neuro:  no focal deficits Psych: appropriate affect     Assessment and Plan  1.SOB/chest pain - unclear etiology - completed pulmonary workup without clear etiology - start workup with echo, pending results consider coronary CTA. Somewhat atypical chest pain but certaintly given her DM2 risk for atypical angina - cannot run on treadmill due to chronic back pain,  recent surgery - EKG today shows NSR, no ischemic changes   F/u pending test results  Arnoldo Lenis, M.D.,

## 2022-02-14 NOTE — Patient Instructions (Signed)
Medication Instructions:  Continue all current medications.  Labwork: none  Testing/Procedures: Your physician has requested that you have an echocardiogram. Echocardiography is a painless test that uses sound waves to create images of your heart. It provides your doctor with information about the size and shape of your heart and how well your heart's chambers and valves are working. This procedure takes approximately one hour. There are no restrictions for this procedure. Please do NOT wear cologne, perfume, aftershave, or lotions (deodorant is allowed). Please arrive 15 minutes prior to your appointment time. Office will contact with results via phone, letter or mychart.     Follow-Up: Pending test results   Any Other Special Instructions Will Be Listed Below (If Applicable).   If you need a refill on your cardiac medications before your next appointment, please call your pharmacy.  

## 2022-02-15 ENCOUNTER — Ambulatory Visit: Payer: Medicare Other | Attending: Cardiology

## 2022-02-15 DIAGNOSIS — R0602 Shortness of breath: Secondary | ICD-10-CM | POA: Diagnosis not present

## 2022-02-15 LAB — ECHOCARDIOGRAM COMPLETE
AR max vel: 1.64 cm2
AV Area VTI: 1.84 cm2
AV Area mean vel: 1.63 cm2
AV Mean grad: 5.8 mmHg
AV Peak grad: 10.6 mmHg
Ao pk vel: 1.63 m/s
Area-P 1/2: 3.46 cm2
Calc EF: 66.5 %
MV M vel: 4.11 m/s
MV Peak grad: 67.5 mmHg
S' Lateral: 2.5 cm
Single Plane A2C EF: 68.7 %
Single Plane A4C EF: 68.8 %

## 2022-03-08 ENCOUNTER — Encounter: Payer: Self-pay | Admitting: *Deleted

## 2022-03-09 ENCOUNTER — Other Ambulatory Visit: Payer: Self-pay | Admitting: *Deleted

## 2022-03-09 ENCOUNTER — Encounter: Payer: Self-pay | Admitting: *Deleted

## 2022-03-09 DIAGNOSIS — R072 Precordial pain: Secondary | ICD-10-CM

## 2022-03-09 DIAGNOSIS — Z01812 Encounter for preprocedural laboratory examination: Secondary | ICD-10-CM

## 2022-03-09 DIAGNOSIS — R0602 Shortness of breath: Secondary | ICD-10-CM

## 2022-03-09 DIAGNOSIS — R079 Chest pain, unspecified: Secondary | ICD-10-CM

## 2022-03-09 MED ORDER — METOPROLOL TARTRATE 50 MG PO TABS: 50.0000 mg | ORAL_TABLET | Freq: Once | ORAL | 0 refills | Status: DC

## 2022-03-10 ENCOUNTER — Other Ambulatory Visit: Payer: Self-pay | Admitting: Cardiology

## 2022-03-10 ENCOUNTER — Encounter (HOSPITAL_COMMUNITY): Payer: Self-pay

## 2022-03-17 DIAGNOSIS — D3131 Benign neoplasm of right choroid: Secondary | ICD-10-CM | POA: Diagnosis not present

## 2022-03-17 DIAGNOSIS — H401132 Primary open-angle glaucoma, bilateral, moderate stage: Secondary | ICD-10-CM | POA: Diagnosis not present

## 2022-03-17 DIAGNOSIS — Z961 Presence of intraocular lens: Secondary | ICD-10-CM | POA: Diagnosis not present

## 2022-03-17 DIAGNOSIS — H04123 Dry eye syndrome of bilateral lacrimal glands: Secondary | ICD-10-CM | POA: Diagnosis not present

## 2022-03-17 DIAGNOSIS — H43813 Vitreous degeneration, bilateral: Secondary | ICD-10-CM | POA: Diagnosis not present

## 2022-03-17 DIAGNOSIS — E119 Type 2 diabetes mellitus without complications: Secondary | ICD-10-CM | POA: Diagnosis not present

## 2022-03-20 DIAGNOSIS — Z780 Asymptomatic menopausal state: Secondary | ICD-10-CM | POA: Diagnosis not present

## 2022-03-20 DIAGNOSIS — Z1231 Encounter for screening mammogram for malignant neoplasm of breast: Secondary | ICD-10-CM | POA: Diagnosis not present

## 2022-03-20 DIAGNOSIS — K625 Hemorrhage of anus and rectum: Secondary | ICD-10-CM | POA: Diagnosis not present

## 2022-03-20 DIAGNOSIS — Z01419 Encounter for gynecological examination (general) (routine) without abnormal findings: Secondary | ICD-10-CM | POA: Diagnosis not present

## 2022-03-20 DIAGNOSIS — Z1331 Encounter for screening for depression: Secondary | ICD-10-CM | POA: Diagnosis not present

## 2022-03-20 DIAGNOSIS — R808 Other proteinuria: Secondary | ICD-10-CM | POA: Diagnosis not present

## 2022-03-27 ENCOUNTER — Other Ambulatory Visit (HOSPITAL_COMMUNITY)
Admission: RE | Admit: 2022-03-27 | Discharge: 2022-03-27 | Disposition: A | Discharge status: Home / Self Care | Payer: Medicare Other | Source: Ambulatory Visit | Attending: Cardiology | Admitting: Cardiology

## 2022-03-27 DIAGNOSIS — Z01812 Encounter for preprocedural laboratory examination: Secondary | ICD-10-CM | POA: Diagnosis not present

## 2022-03-27 DIAGNOSIS — R079 Chest pain, unspecified: Secondary | ICD-10-CM | POA: Diagnosis not present

## 2022-03-27 LAB — BASIC METABOLIC PANEL
Anion gap: 8 (ref 5–15)
BUN: 10 mg/dL (ref 8–23)
CO2: 28 mmol/L (ref 22–32)
Calcium: 9.2 mg/dL (ref 8.9–10.3)
Chloride: 104 mmol/L (ref 98–111)
Creatinine, Ser: 0.49 mg/dL (ref 0.44–1.00)
GFR, Estimated: >60 mL/min (ref >60)
Glucose, Bld: 99 mg/dL (ref 70–99)
Potassium: 3.9 mmol/L (ref 3.5–5.1)
Sodium: 140 mmol/L (ref 135–145)

## 2022-03-29 ENCOUNTER — Ambulatory Visit (HOSPITAL_COMMUNITY)
Admission: RE | Admit: 2022-03-29 | Discharge: 2022-03-29 | Disposition: A | Discharge status: Home / Self Care | Payer: Medicare Other | Source: Ambulatory Visit | Attending: Cardiology | Admitting: Cardiology

## 2022-03-29 ENCOUNTER — Other Ambulatory Visit: Payer: Self-pay | Admitting: Internal Medicine

## 2022-03-29 DIAGNOSIS — R079 Chest pain, unspecified: Secondary | ICD-10-CM | POA: Diagnosis not present

## 2022-03-29 DIAGNOSIS — R931 Abnormal findings on diagnostic imaging of heart and coronary circulation: Secondary | ICD-10-CM

## 2022-03-29 DIAGNOSIS — R072 Precordial pain: Secondary | ICD-10-CM

## 2022-03-29 DIAGNOSIS — I2510 Atherosclerotic heart disease of native coronary artery without angina pectoris: Secondary | ICD-10-CM | POA: Diagnosis not present

## 2022-03-29 DIAGNOSIS — R0602 Shortness of breath: Secondary | ICD-10-CM | POA: Diagnosis not present

## 2022-03-29 MED ORDER — IOHEXOL 350 MG/ML SOLN
100.0000 mL | Freq: Once | INTRAVENOUS | Status: AC | PRN
  Administered 2022-03-29: 100 mL via INTRAVENOUS

## 2022-03-29 MED ORDER — NITROGLYCERIN 0.4 MG SL SUBL
SUBLINGUAL_TABLET | SUBLINGUAL | Status: AC
  Filled 2022-03-29: qty 2

## 2022-03-29 MED ORDER — NITROGLYCERIN 0.4 MG SL SUBL
0.8000 mg | SUBLINGUAL_TABLET | Freq: Once | SUBLINGUAL | Status: AC
  Administered 2022-03-29: 0.8 mg via SUBLINGUAL

## 2022-03-30 ENCOUNTER — Ambulatory Visit (HOSPITAL_BASED_OUTPATIENT_CLINIC_OR_DEPARTMENT_OTHER)
Admission: RE | Admit: 2022-03-30 | Discharge: 2022-03-30 | Disposition: A | Discharge status: Home / Self Care | Payer: Medicare Other | Source: Ambulatory Visit | Attending: Internal Medicine | Admitting: Internal Medicine

## 2022-03-30 DIAGNOSIS — R931 Abnormal findings on diagnostic imaging of heart and coronary circulation: Secondary | ICD-10-CM

## 2022-03-30 DIAGNOSIS — R072 Precordial pain: Secondary | ICD-10-CM | POA: Diagnosis not present

## 2022-03-30 DIAGNOSIS — R079 Chest pain, unspecified: Secondary | ICD-10-CM | POA: Diagnosis not present

## 2022-03-30 DIAGNOSIS — R0602 Shortness of breath: Secondary | ICD-10-CM | POA: Diagnosis not present

## 2022-04-04 ENCOUNTER — Telehealth: Payer: Self-pay | Admitting: *Deleted

## 2022-04-04 NOTE — Telephone Encounter (Signed)
The patient verbally consented for a telehealth phone visit with [facility] and understands that his/her insurance company will be billed for the encounter.  

## 2022-04-05 ENCOUNTER — Ambulatory Visit: Payer: Medicare Other | Attending: Cardiology | Admitting: Cardiology

## 2022-04-05 ENCOUNTER — Encounter: Payer: Self-pay | Admitting: Cardiology

## 2022-04-05 VITALS — Ht 65.0 in | Wt 200.0 lb

## 2022-04-05 DIAGNOSIS — R079 Chest pain, unspecified: Secondary | ICD-10-CM

## 2022-04-05 DIAGNOSIS — I2510 Atherosclerotic heart disease of native coronary artery without angina pectoris: Secondary | ICD-10-CM | POA: Diagnosis not present

## 2022-04-05 DIAGNOSIS — I513 Intracardiac thrombosis, not elsewhere classified: Secondary | ICD-10-CM

## 2022-04-05 MED ORDER — ASPIRIN 81 MG PO TBEC: 81.0000 mg | DELAYED_RELEASE_TABLET | Freq: Every day | ORAL | 3 refills | Status: DC

## 2022-04-05 MED ORDER — ATORVASTATIN CALCIUM 40 MG PO TABS: 40.0000 mg | ORAL_TABLET | Freq: Every day | ORAL | 3 refills | Status: DC

## 2022-04-05 NOTE — Addendum Note (Signed)
Addended by: Berlinda Last on: 04/05/2022 02:36 PM   Modules accepted: Orders

## 2022-04-05 NOTE — Patient Instructions (Addendum)
Medication Instructions:  Your physician has recommended you make the following change in your medication:  - Start Aspirin 81 mg tablet daily - Start Lipitor (Atorvastatin) 40 mg tablet daily   Labwork: None  Testing/Procedures: Your physician has requested that you have a TEE. During a TEE, sound waves are used to create images of your heart. It provides your doctor with information about the size and shape of your heart and how well your heart's chambers and valves are working. In this test, a transducer is attached to the end of a flexible tube that's guided down your throat and into your esophagus (the tube leading from you mouth to your stomach) to get a more detailed image of your heart. You are not awake for the procedure. Please see the instruction sheet given to you today. For further information please visit [email or web address].      Follow-Up: Follow up with Dr. Harl Bowie in 6 months.   Any Other Special Instructions Will Be Listed Below (If Applicable).     If you need a refill on your cardiac medications before your next appointment, please call your pharmacy.

## 2022-04-05 NOTE — Addendum Note (Signed)
Addended by: Berlinda Last on: 04/05/2022 02:42 PM   Modules accepted: Orders

## 2022-04-05 NOTE — Progress Notes (Signed)
Virtual Visit via Telephone Note   Because of Debra Harris Harris co-morbid illnesses, she is at least at moderate risk for complications without adequate follow up.  This format is felt to be most appropriate for this patient at this time.  The patient did not have access to video technology/had technical difficulties with video requiring transitioning to audio format only (telephone).  All issues noted in this document were discussed and addressed.  No physical exam could be performed with this format.  Please refer to the patient's chart for her consent to telehealth for [facility].    Date:  04/05/2022   ID:  Debra Harris Harris, DOB [birth year], MRN [id number] The patient was identified using 2 identifiers.  Patient Location: Home Provider Location: Office/Clinic   PCP:  Asencion Noble, [place] [facility] Cardiologist:  Callaway Hardigree    Evaluation Performed:  Follow-Up Visit  Chief Complaint:  Follow up visit  History of Present Illness:    Debra Harris Harris is a 72 y.o. female seen today for follow up of the following medical problems.   1.DOE/LE edema - followed by pulmonary - from notes progressive symptoms since COVID 01/2021 - High-resolution CT chest showed minimal scarring bilateral lung bases. No evidence of fibrotic interstitial lung disease. She had mild lobular air trapping consistent with small airway disease.  01/2022 BNP 36     - SOB can occur at any time. Sporadic no specific pattern - sedentary lifestyle since back surgery in June     - recent chest pains started beginning of this year. Midchest, can occur at rest or with activity. Feeling of heaviness on chest, can have some associated SOB, occasional pain left or right arm. 5-6/10 in severity, can sometimes be positional. Unsure of duration.      - She underwent a low risk nuclear stress test on 01/03/2018 with no evidence of myocardial ischemia or scar, LVEF 68%.  - Echocardiogram on  12/31/2017 demonstrated normal left ventricular systolic and diastolic function and normal regional wall motion, LVEF 60 to 65%. There was moderate focal basal septal hypertrophy and mild tricuspid regurgitation.      01/2022 echo: LVEF 65-03%, normal diastolic fxn.     - 03/2022 coronary CTA: moderate CAD prox LAD not significant by FFR. Left atrial appendage: Incomplete opacification of the distal left atrial appendage, cannot exclude thrombus. - 04/2021 LDL 142   Soc Hx: Her husband is Dr. Sanjuana Kava, an orthopedic surgeon   Past Medical History:  Diagnosis Date   Adenomatous colon polyp    Allergic rhinitis    Arthritis    Back pain    Constipation    Diabetes mellitus    Diverticulosis    External hemorrhoids    Fibromyalgia    Gallbladder problem    GERD (gastroesophageal reflux disease)    Glaucoma    Glaucoma    Hemorrhoids    grade 3    Hip pain    IBS (irritable bowel syndrome)    IDA (iron deficiency anemia)    Iron deficiency anemia 06/05/2014   Irregular heartbeat    Leg edema    Leg numbness    Leg pain    Low tension glaucoma    Neuropathy, leg    Ovarian cyst 11/20/00   Prediabetes    Sleep apnea    CPAP   Sliding hiatal hernia    Past Surgical History:  Procedure Laterality Date   CARPAL TUNNEL RELEASE Bilateral  CATARACT EXTRACTION     bilateral   CHOLECYSTECTOMY     COLONOSCOPY  2005   Small external hemorrhoids, single sigmoid colon diverticulum, small polyp at the hepatic flexure focally adenomatous.   COLONOSCOPY  2010   Dr. Gala Romney, Versed 4/Demerol 75. Pancolonic diverticula, external hemorrhoids. Colonoscopy every 5 years for history of adenomatous polyps   COLONOSCOPY  2014   Dr. Olevia Perches: Propofol. mild diverticulosis in the sigmoid colon, random colon biopsies benign.   COLONOSCOPY WITH PROPOFOL N/A 09/13/2015   Procedure: COLONOSCOPY WITH PROPOFOL;  Surgeon: Daneil Dolin, MD;  Location: [facility];  Service: Endoscopy;  Laterality:  N/A;  1030   ESOPHAGEAL DILATION N/A 10/26/2014   Procedure: ESOPHAGEAL DILATION [place];  Surgeon: Daneil Dolin, MD;  Location: [facility];  Service: Endoscopy;  Laterality: N/A;   ESOPHAGOGASTRODUODENOSCOPY  2010   Dr. Gala Romney, distal esophageal erosions, Venia Minks dilator passed. Comment small hiatal hernia, patulous EG junction.   ESOPHAGOGASTRODUODENOSCOPY (EGD) WITH PROPOFOL N/A 10/26/2014   Dr.Rourk- distal esophageal erosions c/w mild erosive reflux esophagitis. hiatal hernia, o/w normal EGD   ESOPHAGOGASTRODUODENOSCOPY (EGD) WITH PROPOFOL N/A 09/13/2015   Procedure: ESOPHAGOGASTRODUODENOSCOPY (EGD) WITH PROPOFOL;  Surgeon: Daneil Dolin, MD;  Location: [facility];  Service: Endoscopy;  Laterality: N/A;   GIVENS CAPSULE STUDY N/A 10/26/2014   couple of mucosal breaks/erosions but essentially unremarkable small bowel study   [place]     right-arthroscopy   OOPHORECTOMY  age 33s   PARTIAL HYSTERECTOMY  age 53s   RECTAL EXAM UNDER ANESTHESIA N/A 10/26/2014   circumferential grade 3 hemoorrhoids easily reducible   thumb surger Bilateral    trigger thumb     No outpatient medications have been marked as taking for the 04/05/22 encounter (Appointment) with Arnoldo Lenis, MD.     AllergiesChristeen Douglas [ferric carboxymaltose] and Tizanidine   Social History   Tobacco Use   Smoking status: Never   Smokeless tobacco: Never   Tobacco comments:    Pt reports tried once in 7th grade//ms  Vaping Use   Vaping Use: Never used  Substance Use Topics   Alcohol use: Not Currently    Alcohol/week: 1.0 standard drink of alcohol    Types: 1 Glasses of wine per week    Comment: rare   Drug use: No     Family Hx: The patient's family history includes Depression in her mother; Diabetes in her mother; Glaucoma in her maternal grandmother and mother; Heart disease in her maternal grandfather, maternal grandmother, and mother; Obesity in her mother; Sudden death in  her mother; Ulcers in her mother. There is no history of Colon cancer.  ROS:   Please see the history of present illness.     All other systems reviewed and are negative.   Prior CV studies:   The following studies were reviewed today:  Jan 2024 coronary CTA  IMPRESSION: 1. Moderate CAD in proximal LAD, 50-69% stenosis, CADRADS 3. CT FFR will be performed and reported separately. 2. Coronary calcium score is 56, which places the patient in the 62nd percentile for age and sex matched control. 3. Normal coronary origins with right dominance. 4. Left atrial appendage: Incomplete opacification of the distal left atrial appendage, cannot exclude thrombus.   RECOMMENDATIONS: CAD-RADS 3. Moderate stenosis. Consider symptom-guided anti-ischemic pharmacotherapy as well as risk factor modification per guideline directed care. Additional analysis with CT FFR will be submitted.  FINDINGS: 1. Left Main: Low  likelihood of hemodynamic significance.   2. LAD: Low likelihood of hemodynamic significance. FFR 0.91 beyond proximal LAD lesion. 3. LCX: Low likelihood of hemodynamic significance. 4. RCA: Low likelihood of hemodynamic significance.   IMPRESSION: 1.  CT FFR analysis showed no significant stenoses.   RECOMMENDATIONS: Guideline directed medical therapy for secondary prevention of CAD.   Labs/Other Tests and Data Reviewed:    EKG:  n/a  Recent Labs: 12/20/2021: ALT 15 01/17/2022: Hemoglobin 11.6; Platelets 247 02/06/2022: Pro B Natriuretic peptide (BNP) 36.0 03/27/2022: BUN 10; Creatinine, Ser 0.49; Potassium 3.9; Sodium 140   Recent Lipid Panel Lab Results  Component Value Date/Time   CHOL 162 03/19/2018 12:00 AM   CHOL 179 01/08/2018 03:55 PM   TRIG 70 03/19/2018 12:00 AM   HDL 46 03/19/2018 12:00 AM   HDL 42 01/08/2018 03:55 PM   LDLCALC 102 03/19/2018 12:00 AM   LDLCALC 121 (H) 01/08/2018 03:55 PM    Wt Readings from Last 3 Encounters:  02/14/22 201 lb 9.6 oz  (91.4 kg)  02/06/22 204 lb (92.5 kg)  01/24/22 200 lb (90.7 kg)     Risk Assessment/Calculations:         Objective:    Vital Signs:   Today's Vitals   04/05/22 1356  Weight: 200 lb (90.7 kg)  Height: '5\' 5"'$  (1.651 m)   Body mass index is 33.28 kg/m.   Normal affect. Normal speech pattern and tone. Comfortable, no apparent distress. No audible signs of sob or wheezing.   ASSESSMENT & PLAN:      1.SOB/chest pain - unclear etiology - completed pulmonary workup without clear etiology - echo was benign, coronary CTA with moderate disease not significant by FFR - benign cardiac workup for causes of symptoms  2. CAD - moderate LAD disease by CTA not significant by FFR - start ASA '81mg'$  daily, atorva '40mg'$  daily   3. Possible left atrial appendage thrombus - filling defect on coronary CTA of unclear significant, cannot exclude LA appendage thrombus - plan for TEE to further evaluate.       Time:   Today, I have spent 16 minutes with the patient with telehealth technology discussing the above problems.     Medication Adjustments/Labs and Tests Ordered: Current medicines are reviewed at length with the patient today.  Concerns regarding medicines are outlined above.   Tests Ordered: No orders of the defined types were placed in this encounter.   Medication Changes: No orders of the defined types were placed in this encounter.   Follow Up:  In Person 6 months  Signed, Carlyle Dolly, MD  04/05/2022 1:22 PM    [place]

## 2022-04-11 ENCOUNTER — Telehealth: Payer: Self-pay | Admitting: Cardiology

## 2022-04-11 NOTE — Telephone Encounter (Signed)
Spoke to pt who stated that she needed to know when her TEE was scheduled as she needed to have her spouse bring her and he is an orthopedist who needed to move around his schedule. Spoke with Pamala Hurry in Day Surgery who stated the pt was scheduled for 1pm, but needs to arrive at 12 pm. Pt notified and verbalized understanding.

## 2022-04-11 NOTE — Telephone Encounter (Signed)
Patient is calling requesting the arrival time for her procedure on 02/29. Please advise.

## 2022-04-14 DIAGNOSIS — Z1231 Encounter for screening mammogram for malignant neoplasm of breast: Secondary | ICD-10-CM | POA: Diagnosis not present

## 2022-04-14 DIAGNOSIS — Z780 Asymptomatic menopausal state: Secondary | ICD-10-CM | POA: Diagnosis not present

## 2022-04-14 DIAGNOSIS — Z13820 Encounter for screening for osteoporosis: Secondary | ICD-10-CM | POA: Diagnosis not present

## 2022-04-21 ENCOUNTER — Inpatient Hospital Stay: Payer: Medicare Other

## 2022-04-24 ENCOUNTER — Other Ambulatory Visit (HOSPITAL_COMMUNITY)
Admission: RE | Admit: 2022-04-24 | Discharge: 2022-04-24 | Disposition: A | Discharge status: Home / Self Care | Payer: Medicare Other | Source: Ambulatory Visit | Attending: Physician Assistant | Admitting: Physician Assistant

## 2022-04-24 ENCOUNTER — Other Ambulatory Visit: Payer: Self-pay

## 2022-04-24 DIAGNOSIS — E559 Vitamin D deficiency, unspecified: Secondary | ICD-10-CM

## 2022-04-24 DIAGNOSIS — D500 Iron deficiency anemia secondary to blood loss (chronic): Secondary | ICD-10-CM | POA: Diagnosis not present

## 2022-04-24 LAB — CBC WITH DIFFERENTIAL/PLATELET
Abs Immature Granulocytes: 0.01 10*3/uL (ref 0.00–0.07)
Basophils Absolute: 0 10*3/uL (ref 0.0–0.1)
Basophils Relative: 1 %
Eosinophils Absolute: 0.2 10*3/uL (ref 0.0–0.5)
Eosinophils Relative: 3 %
HCT: 36.4 % (ref 36.0–46.0)
Hemoglobin: 11.7 g/dL — ABNORMAL LOW (ref 12.0–15.0)
Immature Granulocytes: 0 %
Lymphocytes Relative: 23 %
Lymphs Abs: 1.2 10*3/uL (ref 0.7–4.0)
MCH: 28.5 pg (ref 26.0–34.0)
MCHC: 32.1 g/dL (ref 30.0–36.0)
MCV: 88.8 fL (ref 80.0–100.0)
Monocytes Absolute: 0.5 10*3/uL (ref 0.1–1.0)
Monocytes Relative: 9 %
Neutro Abs: 3.1 10*3/uL (ref 1.7–7.7)
Neutrophils Relative %: 64 %
Platelets: 208 10*3/uL (ref 150–400)
RBC: 4.1 MIL/uL (ref 3.87–5.11)
RDW: 14.5 % (ref 11.5–15.5)
WBC: 5 10*3/uL (ref 4.0–10.5)
nRBC: 0 % (ref 0.0–0.2)

## 2022-04-24 LAB — IRON AND TIBC
Iron: 57 ug/dL (ref 28–170)
Saturation Ratios: 14 % (ref 10.4–31.8)
TIBC: 405 ug/dL (ref 250–450)
UIBC: 348 ug/dL

## 2022-04-24 LAB — VITAMIN D 25 HYDROXY (VIT D DEFICIENCY, FRACTURES): Vit D, 25-Hydroxy: 31.78 ng/mL (ref 30–100)

## 2022-04-24 LAB — FERRITIN: Ferritin: 51 ng/mL (ref 11–307)

## 2022-04-24 NOTE — Patient Instructions (Signed)
Debra Harris  04/24/2022     '@PREFPERIOPPHARMACY'$ @   Your procedure is scheduled on  04/27/2022.   Report to [facility] at  1100 A.M.   Call this number if you have problems the morning of surgery:  [phone]  If you experience any cold or flu symptoms such as cough, fever, chills, shortness of breath, etc. between now and your scheduled surgery, please notify us at the above number.   Remember:  Do not eat or drink after midnight.      Take these medicines the morning of surgery with A SIP OF WATER       flexeril (if needed), lexapro, hydrocodone(if needed).     Do not wear jewelry, make-up or nail polish.  Do not wear lotions, powders, or perfumes, or deodorant.  Do not shave 48 hours prior to surgery.  Men may shave face and neck.  Do not bring valuables to the hospital.  [facility] is not responsible for any belongings or valuables.  Contacts, dentures or bridgework may not be worn into surgery.  Leave your suitcase in the car.  After surgery it may be brought to your room.  For patients admitted to the hospital, discharge time will be determined by your treatment team.  Patients discharged the day of surgery will not be allowed to drive home and must have someone with them for 24 hours.    Special instructions:   DO NOT smoke tobacco or vape for 24 hours before your procedure.  Please read over the following fact sheets that you were given. Anesthesia Post-op Instructions and Care and Recovery After Surgery       Transesophageal Echocardiogram Transesophageal echocardiogram (TEE) is a test that uses sound waves to take pictures of your heart. TEE is done by passing a small probe attached to a flexible tube down the part of the body that moves food from your mouth to your stomach (esophagus). The pictures give clear images of your heart. This can help your doctor see if there are problems with your heart. Tell a doctor about: Any allergies you  have. All medicines you are taking. This includes vitamins, herbs, eye drops, creams, and over-the-counter medicines. Any problems you or family members have had with anesthetic medicines. Any blood disorders you have. Any surgeries you have had. Any medical conditions you have. Any swallowing problems. Whether you have or have had a blockage in the part of the body that moves food from your mouth to your stomach. Whether you are pregnant or may be pregnant. What are the risks? In general, this is a safe procedure. But, problems may occur, such as: Damage to nearby structures or organs. A tear in the part of the body that moves food from your mouth to your stomach. Irregular heartbeat. Hoarse voice or trouble swallowing. Bleeding. What happens before the procedure? Medicines Ask your doctor about changing or stopping: Your normal medicines. Vitamins, herbs, and supplements. Over-the-counter medicines. Do not take aspirin or ibuprofen unless you are told to. General instructions Follow instructions from your doctor about what you cannot eat or drink. You will take out any dentures or dental retainers. Plan to have a responsible adult take you home from the hospital or clinic. Plan to have a responsible adult care for you for the time you are told after you leave the hospital or clinic. This is important. What happens during the procedure?  An IV will be put into one of your  veins. You may be given: A sedative. This medicine helps you relax. A medicine to numb the back of your throat. This may be sprayed or gargled. Your blood pressure, heart rate, and breathing will be watched. You may be asked to lie on your left side. A bite block will be placed in your mouth. This keeps you from biting the tube. The tip of the probe will be placed into the back of your mouth. You will be asked to swallow. Your doctor will take pictures of your heart. The probe and bite block will be taken  out after the test is done. The procedure may vary among doctors and hospitals. What can I expect after the procedure? You will be monitored until you leave the hospital or clinic. This includes checking your blood pressure, heart rate, breathing rate, and blood oxygen level. Your throat may feel sore and numb. This will get better over time. You will not be allowed to eat or drink until the numbness has gone away. It is common to have a sore throat for a day or two. It is up to you to get the results of your procedure. Ask how to get your results when they are ready. Follow these instructions at home: If you were given a sedative during your procedure, do not drive or use machines until your doctor says that it is safe. Return to your normal activities when your doctor says that it is safe. Keep all follow-up visits. Summary TEE is a test that uses sound waves to take pictures of your heart. You will be given a medicine to help you relax. Do not drive or use machines until your doctor says that it is safe. This information is not intended to replace advice given to you by your health care provider. Make sure you discuss any questions you have with your health care provider. Document Revised: 10/27/2020 Document Reviewed: 10/07/2019 Elsevier Patient Education  [place] After The following information offers guidance on how to care for yourself after your procedure. Your health care provider may also give you more specific instructions. If you have problems or questions, contact your health care provider. What can I expect after the procedure? After the procedure, it is common to have: Tiredness. Little or no memory about what happened during or after the procedure. Impaired judgment when it comes to making decisions. Nausea or vomiting. Some trouble with balance. Follow these instructions at home: For the time period you were told by your health  care provider:  Rest. Do not participate in activities where you could fall or become injured. Do not drive or use machinery. Do not drink alcohol. Do not take sleeping pills or medicines that cause drowsiness. Do not make important decisions or sign legal documents. Do not take care of children on your own. Medicines Take over-the-counter and prescription medicines only as told by your health care provider. If you were prescribed antibiotics, take them as told by your health care provider. Do not stop using the antibiotic even if you start to feel better. Eating and drinking Follow instructions from your health care provider about what you may eat and drink. Drink enough fluid to keep your urine pale yellow. If you vomit: Drink clear fluids slowly and in small amounts as you are able. Clear fluids include water, ice chips, low-calorie sports drinks, and fruit juice that has water added to it (diluted fruit juice). Eat light and bland foods in small amounts  as you are able. These foods include bananas, applesauce, rice, lean meats, toast, and crackers. General instructions  Have a responsible adult stay with you for the time you are told. It is important to have someone help care for you until you are awake and alert. If you have sleep apnea, surgery and some medicines can increase your risk for breathing problems. Follow instructions from your health care provider about wearing your sleep device: When you are sleeping. This includes during daytime naps. While taking prescription pain medicines, sleeping medicines, or medicines that make you drowsy. Do not use any products that contain nicotine or tobacco. These products include cigarettes, chewing tobacco, and vaping devices, such as e-cigarettes. If you need help quitting, ask your health care provider. Contact a health care provider if: You feel nauseous or vomit every time you eat or drink. You feel light-headed. You are still sleepy  or having trouble with balance after 24 hours. You get a rash. You have a fever. You have redness or swelling around the IV site. Get help right away if: You have trouble breathing. You have new confusion after you get home. These symptoms may be an emergency. Get help right away. Call 911. Do not wait to see if the symptoms will go away. Do not drive yourself to the hospital. This information is not intended to replace advice given to you by your health care provider. Make sure you discuss any questions you have with your health care provider. Document Revised: 07/11/2021 Document Reviewed: 07/11/2021 Elsevier Patient Education  [place].

## 2022-04-24 NOTE — Pre-Procedure Instructions (Signed)
Dr Sinclair Grooms for orders.

## 2022-04-25 ENCOUNTER — Other Ambulatory Visit: Payer: Self-pay | Admitting: Cardiology

## 2022-04-25 ENCOUNTER — Inpatient Hospital Stay: Payer: Medicare Other

## 2022-04-25 ENCOUNTER — Encounter (HOSPITAL_COMMUNITY): Payer: Self-pay

## 2022-04-25 ENCOUNTER — Encounter (HOSPITAL_COMMUNITY)
Admission: RE | Admit: 2022-04-25 | Discharge: 2022-04-25 | Disposition: A | Discharge status: Home / Self Care | Payer: Medicare Other | Source: Ambulatory Visit | Attending: Cardiology | Admitting: Cardiology

## 2022-04-25 DIAGNOSIS — I513 Intracardiac thrombosis, not elsewhere classified: Secondary | ICD-10-CM

## 2022-04-26 ENCOUNTER — Telehealth: Payer: Medicare Other | Admitting: Physician Assistant

## 2022-04-26 ENCOUNTER — Other Ambulatory Visit: Payer: Self-pay | Admitting: Radiology

## 2022-04-26 DIAGNOSIS — G8929 Other chronic pain: Secondary | ICD-10-CM

## 2022-04-26 MED ORDER — HYDROCODONE-ACETAMINOPHEN 5-325 MG PO TABS: 1.0000 | ORAL_TABLET | Freq: Four times a day (QID) | ORAL | 0 refills | Status: DC | PRN

## 2022-04-26 NOTE — Telephone Encounter (Signed)
Patient called, asking for refill hydrocodone and prednisone.  I will let you enter the prednisone Rx.   Having a little flare of pain with recent increased activities.  Please send to [facility]

## 2022-04-27 ENCOUNTER — Other Ambulatory Visit: Payer: Self-pay

## 2022-04-27 ENCOUNTER — Other Ambulatory Visit (HOSPITAL_COMMUNITY): Payer: Self-pay | Admitting: *Deleted

## 2022-04-27 ENCOUNTER — Other Ambulatory Visit: Payer: Self-pay | Admitting: Orthopedic Surgery

## 2022-04-27 ENCOUNTER — Ambulatory Visit (HOSPITAL_BASED_OUTPATIENT_CLINIC_OR_DEPARTMENT_OTHER): Payer: Medicare Other | Admitting: Certified Registered Nurse Anesthetist

## 2022-04-27 ENCOUNTER — Telehealth: Payer: Medicare Other | Admitting: Physician Assistant

## 2022-04-27 ENCOUNTER — Encounter: Payer: Self-pay | Admitting: Radiology

## 2022-04-27 ENCOUNTER — Ambulatory Visit (HOSPITAL_COMMUNITY)
Admission: RE | Admit: 2022-04-27 | Discharge: 2022-04-27 | Disposition: A | Discharge status: Home / Self Care | Payer: Medicare Other | Attending: Cardiology | Admitting: Cardiology

## 2022-04-27 ENCOUNTER — Ambulatory Visit (HOSPITAL_BASED_OUTPATIENT_CLINIC_OR_DEPARTMENT_OTHER): Payer: Medicare Other

## 2022-04-27 ENCOUNTER — Encounter (HOSPITAL_COMMUNITY)
Admission: RE | Disposition: A | Discharge status: Home / Self Care | Payer: Self-pay | Source: Home / Self Care | Attending: Cardiology

## 2022-04-27 ENCOUNTER — Ambulatory Visit (HOSPITAL_COMMUNITY): Payer: Medicare Other | Admitting: Certified Registered Nurse Anesthetist

## 2022-04-27 DIAGNOSIS — I361 Nonrheumatic tricuspid (valve) insufficiency: Secondary | ICD-10-CM

## 2022-04-27 DIAGNOSIS — G4730 Sleep apnea, unspecified: Secondary | ICD-10-CM

## 2022-04-27 DIAGNOSIS — R011 Cardiac murmur, unspecified: Secondary | ICD-10-CM

## 2022-04-27 DIAGNOSIS — I081 Rheumatic disorders of both mitral and tricuspid valves: Secondary | ICD-10-CM | POA: Diagnosis not present

## 2022-04-27 DIAGNOSIS — I340 Nonrheumatic mitral (valve) insufficiency: Secondary | ICD-10-CM | POA: Diagnosis not present

## 2022-04-27 DIAGNOSIS — G8929 Other chronic pain: Secondary | ICD-10-CM | POA: Diagnosis not present

## 2022-04-27 DIAGNOSIS — R079 Chest pain, unspecified: Secondary | ICD-10-CM | POA: Insufficient documentation

## 2022-04-27 DIAGNOSIS — Z7984 Long term (current) use of oral hypoglycemic drugs: Secondary | ICD-10-CM | POA: Insufficient documentation

## 2022-04-27 DIAGNOSIS — E119 Type 2 diabetes mellitus without complications: Secondary | ICD-10-CM | POA: Diagnosis not present

## 2022-04-27 DIAGNOSIS — R600 Localized edema: Secondary | ICD-10-CM | POA: Diagnosis not present

## 2022-04-27 DIAGNOSIS — R0609 Other forms of dyspnea: Secondary | ICD-10-CM | POA: Insufficient documentation

## 2022-04-27 DIAGNOSIS — M549 Dorsalgia, unspecified: Secondary | ICD-10-CM | POA: Diagnosis not present

## 2022-04-27 DIAGNOSIS — I513 Intracardiac thrombosis, not elsewhere classified: Secondary | ICD-10-CM

## 2022-04-27 DIAGNOSIS — G4733 Obstructive sleep apnea (adult) (pediatric): Secondary | ICD-10-CM | POA: Diagnosis not present

## 2022-04-27 HISTORY — PX: TEE WITHOUT CARDIOVERSION: SHX5443

## 2022-04-27 LAB — GLUCOSE, CAPILLARY
Glucose-Capillary: 109 mg/dL — ABNORMAL HIGH (ref 70–99)
Glucose-Capillary: 106 mg/dL — ABNORMAL HIGH (ref 70–99)

## 2022-04-27 LAB — ECHO TEE

## 2022-04-27 SURGERY — ECHOCARDIOGRAM, TRANSESOPHAGEAL
Anesthesia: General

## 2022-04-27 MED ORDER — ORAL CARE MOUTH RINSE: 15.0000 mL | Freq: Once | OROMUCOSAL | Status: DC

## 2022-04-27 MED ORDER — LACTATED RINGERS IV SOLN: INTRAVENOUS | Status: DC

## 2022-04-27 MED ORDER — CHLORHEXIDINE GLUCONATE 0.12 % MT SOLN: 15.0000 mL | Freq: Once | OROMUCOSAL | Status: DC

## 2022-04-27 MED ORDER — SODIUM CHLORIDE 0.9 % IV SOLN: INTRAVENOUS | Status: DC

## 2022-04-27 MED ORDER — BUTAMBEN-TETRACAINE-BENZOCAINE 2-2-14 % EX AERO
INHALATION_SPRAY | CUTANEOUS | Status: AC
  Filled 2022-04-27: qty 5

## 2022-04-27 MED ORDER — PROPOFOL 10 MG/ML IV BOLUS
INTRAVENOUS | Status: DC | PRN
  Administered 2022-04-27: 50 mg via INTRAVENOUS
  Administered 2022-04-27: 20 mg via INTRAVENOUS

## 2022-04-27 MED ORDER — BUTAMBEN-TETRACAINE-BENZOCAINE 2-2-14 % EX AERO
INHALATION_SPRAY | CUTANEOUS | Status: DC | PRN
  Administered 2022-04-27: 2 via TOPICAL

## 2022-04-27 MED ORDER — PREDNISONE 10 MG (48) PO TBPK: ORAL_TABLET | ORAL | 0 refills | Status: DC

## 2022-04-27 MED ORDER — PROPOFOL 500 MG/50ML IV EMUL
INTRAVENOUS | Status: DC | PRN
  Administered 2022-04-27: 75 ug/kg/min via INTRAVENOUS

## 2022-04-27 NOTE — H&P (Signed)
Procedure H&P  Patient presents for outpatient TEE for further evaluation of abnormal findings on cardiac CT. Imaging with LA appendage filling defect suggesting possible thrombus, TEE today to better evaluate. For full history please refer to recent clinic note referenced below  Debra Dolly MD     Clinical Summary Debra Harris is a 72 y.o.female last seen by Dr Bronson Ing in 01/2018, this is our first visit together. Seen for the following medical problems.   1.DOE/LE edema - followed by pulmonary - from notes progressive symptoms since COVID 01/2021 - High-resolution CT chest showed minimal scarring bilateral lung bases. No evidence of fibrotic interstitial lung disease. She had mild lobular air trapping consistent with small airway disease.  01/2022 BNP 36     - SOB can occur at any time. Sporadic no specific pattern - sedentary lifestyle since back surgery in June     - recent chest pains started beginning of this year. Midchest, can occur at rest or with activity. Feeling of heaviness on chest, can have some associated SOB, occasional pain left or right arm. 5-6/10 in severity, can sometimes be positional. Unsure of duration.      - She underwent a low risk nuclear stress test on 01/03/2018 with no evidence of myocardial ischemia or scar, LVEF 68%.  - Echocardiogram on 12/31/2017 demonstrated normal left ventricular systolic and diastolic function and normal regional wall motion, LVEF 60 to 65%. There was moderate focal basal septal hypertrophy and mild tricuspid regurgitation.                Soc Hx: Her husband is Dr. Sanjuana Kava, an orthopedic surgeon        Past Medical History:  Diagnosis Date   Adenomatous colon polyp     Allergic rhinitis     Arthritis     Back pain     Constipation     Diabetes mellitus     Diverticulosis     External hemorrhoids     Fibromyalgia     Gallbladder problem     GERD (gastroesophageal reflux disease)     Glaucoma      Glaucoma     Hemorrhoids      grade 3    Hip pain     IBS (irritable bowel syndrome)     IDA (iron deficiency anemia)     Iron deficiency anemia 06/05/2014   Irregular heartbeat     Leg edema     Leg numbness     Leg pain     Low tension glaucoma     Neuropathy, leg     Ovarian cyst 11/20/00   Prediabetes     Sleep apnea      CPAP   Sliding hiatal hernia               Allergies  Allergen Reactions   Injectafer [Ferric Carboxymaltose]        Has tolerated feraheme without problems   Tizanidine Swelling      Tongue swelling               Current Outpatient Medications  Medication Sig Dispense Refill   cyclobenzaprine (FLEXERIL) 5 MG tablet Take 1 tablet (5 mg total) by mouth 3 (three) times daily as needed for muscle spasms. 30 tablet 0   diphenhydramine-acetaminophen (TYLENOL PM EXTRA STRENGTH) 25-500 MG TABS tablet Take 1 tablet by mouth at bedtime as needed.       escitalopram (LEXAPRO) 10 MG tablet Take 10 mg by  mouth daily.       fluticasone-salmeterol (ADVAIR HFA) 115-21 MCG/ACT inhaler Inhale 2 puffs into the lungs 2 (two) times daily. 1 each 12   guaiFENesin 200 MG tablet Take 400 mg by mouth daily.       HYDROcodone-acetaminophen (NORCO/VICODIN) 5-325 MG tablet Take 1 tablet by mouth every 6 (six) hours as needed for moderate pain. 30 tablet 0   LUMIGAN 0.01 % SOLN daily.       Magnesium 400 MG TABS Take 1 tablet by mouth daily.       metFORMIN (GLUCOPHAGE) 500 MG tablet Take 500 mg by mouth 2 (two) times daily with a meal.       Multiple Vitamins-Minerals (MULTIVITAMIN ADULT PO) Take 1 tablet by mouth daily.       naproxen sodium (ALEVE) 220 MG tablet Take 220 mg by mouth.       NON FORMULARY CPAP at night        Wheat Dextrin (BENEFIBER) POWD Take by mouth daily.        No current facility-administered medications for this visit.             Past Surgical History:  Procedure Laterality Date   CARPAL TUNNEL RELEASE Bilateral     CATARACT EXTRACTION         bilateral   CHOLECYSTECTOMY       COLONOSCOPY   2005    Small external hemorrhoids, single sigmoid colon diverticulum, small polyp at the hepatic flexure focally adenomatous.   COLONOSCOPY   2010    Dr. Gala Romney, Versed 4/Demerol 75. Pancolonic diverticula, external hemorrhoids. Colonoscopy every 5 years for history of adenomatous polyps   COLONOSCOPY   2014    Dr. Olevia Perches: Propofol. mild diverticulosis in the sigmoid colon, random colon biopsies benign.   COLONOSCOPY WITH PROPOFOL N/A 09/13/2015    Procedure: COLONOSCOPY WITH PROPOFOL;  Surgeon: Daneil Dolin, MD;  Location: [facility];  Service: Endoscopy;  Laterality: N/A;  1030   ESOPHAGEAL DILATION N/A 10/26/2014    Procedure: ESOPHAGEAL DILATION [place];  Surgeon: Daneil Dolin, MD;  Location: [facility];  Service: Endoscopy;  Laterality: N/A;   ESOPHAGOGASTRODUODENOSCOPY   2010    Dr. Gala Romney, distal esophageal erosions, Venia Minks dilator passed. Comment small hiatal hernia, patulous EG junction.   ESOPHAGOGASTRODUODENOSCOPY (EGD) WITH PROPOFOL N/A 10/26/2014    Dr.Rourk- distal esophageal erosions c/w mild erosive reflux esophagitis. hiatal hernia, o/w normal EGD   ESOPHAGOGASTRODUODENOSCOPY (EGD) WITH PROPOFOL N/A 09/13/2015    Procedure: ESOPHAGOGASTRODUODENOSCOPY (EGD) WITH PROPOFOL;  Surgeon: Daneil Dolin, MD;  Location: [facility];  Service: Endoscopy;  Laterality: N/A;   GIVENS CAPSULE STUDY N/A 10/26/2014    couple of mucosal breaks/erosions but essentially unremarkable small bowel study   HEMORRHOID BANDING       KNEE SURGERY        right-arthroscopy   OOPHORECTOMY   age 58s   PARTIAL HYSTERECTOMY   age 61s   RECTAL EXAM UNDER ANESTHESIA N/A 10/26/2014    circumferential grade 3 hemoorrhoids easily reducible   thumb surger Bilateral      trigger thumb             Allergies  Allergen Reactions   Injectafer [Ferric Carboxymaltose]        Has tolerated feraheme without problems   Tizanidine Swelling      Tongue swelling                 Family History  Problem  Relation Age of Onset   Ulcers Mother     Glaucoma Mother     Heart disease Mother     Diabetes Mother     Depression Mother     Sudden death Mother     Obesity Mother     Glaucoma Maternal Grandmother     Heart disease Maternal Grandmother     Heart disease Maternal Grandfather     Colon cancer Neg Hx          Social History Ms. Embler reports that she has never smoked. She has never used smokeless tobacco. Ms. Nasir reports that she does not currently use alcohol after a past usage of about 1.0 standard drink of alcohol per week.     Review of Systems CONSTITUTIONAL: No weight loss, fever, chills, weakness or fatigue.  HEENT: Eyes: No visual loss, blurred vision, double vision or yellow sclerae.No hearing loss, sneezing, congestion, runny nose or sore throat.  SKIN: No rash or itching.  CARDIOVASCULAR: per hpi RESPIRATORY: per hpi GASTROINTESTINAL: No anorexia, nausea, vomiting or diarrhea. No abdominal pain or blood.  GENITOURINARY: No burning on urination, no polyuria NEUROLOGICAL: No headache, dizziness, syncope, paralysis, ataxia, numbness or tingling in the extremities. No change in bowel or bladder control.  MUSCULOSKELETAL: No muscle, back pain, joint pain or stiffness.  LYMPHATICS: No enlarged nodes. No history of splenectomy.  PSYCHIATRIC: No history of depression or anxiety.  ENDOCRINOLOGIC: No reports of sweating, cold or heat intolerance. No polyuria or polydipsia.  Marland Kitchen     Physical Examination    Today's Vitals    02/14/22 1312  BP: 138/76  Pulse: 60  SpO2: 99%  Weight: 201 lb 9.6 oz (91.4 kg)  Height: '5\' 5"'$  (1.651 m)    Body mass index is 33.55 kg/m.   Gen: resting comfortably, no acute distress HEENT: no scleral icterus, pupils equal round and reactive, no palptable cervical adenopathy,  CV: RRR, no m/r/g no jvd Resp: Clear to auscultation bilaterally GI: abdomen is soft, non-tender, non-distended,  normal bowel sounds, no hepatosplenomegaly MSK: extremities are warm, no edema.  Skin: warm, no rash Neuro:  no focal deficits Psych: appropriate affect         Assessment and Plan  1.SOB/chest pain - unclear etiology - completed pulmonary workup without clear etiology - start workup with echo, pending results consider coronary CTA. Somewhat atypical chest pain but certaintly given her DM2 risk for atypical angina - cannot run on treadmill due to chronic back pain, recent surgery - EKG today shows NSR, no ischemic changes     F/u pending test results   Arnoldo Lenis, M.D.,

## 2022-04-27 NOTE — Anesthesia Preprocedure Evaluation (Addendum)
Anesthesia Evaluation  Patient identified by MRN, date of birth, ID band Patient awake    Reviewed: Allergy & Precautions, H&P , NPO status , Patient's Chart, lab work & pertinent test results  Airway Mallampati: II  TM Distance: >3 FB Neck ROM: Full    Dental  (+) Dental Advisory Given, Caps   Pulmonary sleep apnea    Pulmonary exam normal breath sounds clear to auscultation       Cardiovascular + Valvular Problems/Murmurs  Rhythm:Regular Rate:Normal + Systolic murmurs  Arnoldo Lenis, MD 03/03/2022 10:13 AM EST   No worrsiome findings on echo, heart function looks good. Please order a coronary CTA for chest pain.   Zandra Abts MD     Neuro/Psych  Headaches  Neuromuscular disease  negative psych ROS   GI/Hepatic Neg liver ROS, hiatal hernia,GERD  Medicated,,  Endo/Other  diabetes, Well Controlled, Type 2, Oral Hypoglycemic Agents    Renal/GU negative Renal ROS  negative genitourinary   Musculoskeletal  (+) Arthritis , Osteoarthritis,  Fibromyalgia -  Abdominal   Peds negative pediatric ROS (+)  Hematology  (+) Blood dyscrasia, anemia   Anesthesia Other Findings   Reproductive/Obstetrics negative OB ROS                             Anesthesia Physical Anesthesia Plan  ASA: 3  Anesthesia Plan: General   Post-op Pain Management: Minimal or no pain anticipated   Induction: Intravenous  PONV Risk Score and Plan: 0 and Propofol infusion  Airway Management Planned: Nasal Cannula and Natural Airway  Additional Equipment:   Intra-op Plan:   Post-operative Plan:   Informed Consent: I have reviewed the patients History and Physical, chart, labs and discussed the procedure including the risks, benefits and alternatives for the proposed anesthesia with the patient or authorized representative who has indicated his/her understanding and acceptance.     Dental advisory given  Plan  Discussed with: CRNA and Surgeon  Anesthesia Plan Comments:         Anesthesia Quick Evaluation

## 2022-04-27 NOTE — Progress Notes (Signed)
*  PRELIMINARY RESULTS* Echocardiogram Echocardiogram Transesophageal has been performed.  Samuel Germany 04/27/2022, 10:23 AM

## 2022-04-27 NOTE — Transfer of Care (Signed)
Immediate Anesthesia Transfer of Care Note  Patient: Debra Harris  Procedure(s) Performed: TRANSESOPHAGEAL ECHOCARDIOGRAM (TEE)  Patient Location: PACU  Anesthesia Type:General  Level of Consciousness: drowsy  Airway & Oxygen Therapy: Patient Spontanous Breathing and Patient connected to nasal cannula oxygen  Post-op Assessment: Report given to RN and Post -op Vital signs reviewed and stable  Post vital signs: Reviewed and stable  Last Vitals:  Vitals Value Taken Time  BP 102/76   Temp 97.5   Pulse 67   Resp 22   SpO2 95%     Last Pain:  Vitals:   04/27/22 0852  TempSrc: Oral  PainSc: 5       Patients Stated Pain Goal: 5 ([id number])  Complications: No notable events documented.

## 2022-04-27 NOTE — CV Procedure (Signed)
CV Procedure Note  Procedure: Transesophageal echocardiogram Physician: Dr Carlyle Dolly MD Indication possible LA thrombus   Patient was brought to the procedure suite after appropriate consent was obtained. Sedation was achieved with the assistance of anesthesiology, please refer to there documentation for details. She was placed in the left lateral decubitis postion and bite block placed. TEE probe intubated into the esophagus without difficulty and several images obtained. Please see TEE report for full findings, overall normal appedage without evidence of thrombus. Cardipulmonary montioring performed throughout the procedure, she tolerated well without complications   Carlyle Dolly MD

## 2022-04-27 NOTE — Anesthesia Postprocedure Evaluation (Signed)
Anesthesia Post Note  Patient: Debra Harris  Procedure(s) Performed: TRANSESOPHAGEAL ECHOCARDIOGRAM (TEE)  Patient location during evaluation: Phase II Anesthesia Type: General Level of consciousness: awake and alert and oriented Pain management: pain level controlled Vital Signs Assessment: post-procedure vital signs reviewed and stable Respiratory status: spontaneous breathing, nonlabored ventilation and respiratory function stable Cardiovascular status: blood pressure returned to baseline and stable Postop Assessment: no apparent nausea or vomiting Anesthetic complications: no  No notable events documented.   Last Vitals:  Vitals:   04/27/22 1031 04/27/22 1039  BP: 138/76 (!) 148/65  Pulse: (!) 55 61  Resp: 14 16  Temp:  36.4 C  SpO2: 100% 100%    Last Pain:  Vitals:   04/27/22 1039  TempSrc: Oral  PainSc: 0-No pain                 Ari Engelbrecht C Zarius Furr

## 2022-04-27 NOTE — Telephone Encounter (Signed)
Patient requested hydrocodone and steroid taper yesterday Hydrocodone sent but not prednisone, pended please advise

## 2022-05-04 ENCOUNTER — Encounter: Payer: Medicare Other | Admitting: Orthopedic Surgery

## 2022-05-08 ENCOUNTER — Ambulatory Visit: Payer: Self-pay

## 2022-05-08 ENCOUNTER — Ambulatory Visit (INDEPENDENT_AMBULATORY_CARE_PROVIDER_SITE_OTHER): Payer: Medicare Other | Admitting: Orthopedic Surgery

## 2022-05-08 ENCOUNTER — Other Ambulatory Visit: Payer: Self-pay | Admitting: Radiology

## 2022-05-08 ENCOUNTER — Encounter: Payer: Self-pay | Admitting: Orthopedic Surgery

## 2022-05-08 ENCOUNTER — Ambulatory Visit (INDEPENDENT_AMBULATORY_CARE_PROVIDER_SITE_OTHER): Payer: Medicare Other

## 2022-05-08 VITALS — BP 142/73 | HR 65 | Ht 64.0 in | Wt 200.0 lb

## 2022-05-08 DIAGNOSIS — M180 Bilateral primary osteoarthritis of first carpometacarpal joints: Secondary | ICD-10-CM

## 2022-05-08 DIAGNOSIS — M79641 Pain in right hand: Secondary | ICD-10-CM

## 2022-05-08 DIAGNOSIS — M65332 Trigger finger, left middle finger: Secondary | ICD-10-CM

## 2022-05-08 MED ORDER — CYCLOBENZAPRINE HCL 5 MG PO TABS: 5.0000 mg | ORAL_TABLET | Freq: Three times a day (TID) | ORAL | 0 refills | Status: DC | PRN

## 2022-05-08 MED ORDER — METHYLPREDNISOLONE ACETATE 40 MG/ML IJ SUSP
40.0000 mg | Freq: Once | INTRAMUSCULAR | Status: AC
  Administered 2022-05-08: 40 mg via INTRA_ARTICULAR

## 2022-05-08 NOTE — Patient Instructions (Signed)
Splints from Occupational therapy have been ordered for you at [facility]. They should call you to schedule, [phone] is the phone number to call, if you want to call to schedule.

## 2022-05-08 NOTE — Progress Notes (Addendum)
Chief Complaint  Patient presents with   Hand Problem    Bilateral hand numbness/ thumbs left is worse    72 year old female with new onset pain in her left thumb and right thumb also has a cyst over the left long finger at the PIP joint and a left long finger catching and pain over the A1 pulley  No history of trauma  Review of systems she has had carpal tunnel surgery this is not associated with any numbness or tingling.  Physical Exam Vitals and nursing note reviewed.  Constitutional:      Appearance: Normal appearance.  HENT:     Head: Normocephalic and atraumatic.  Eyes:     General: No scleral icterus.       Right eye: No discharge.        Left eye: No discharge.     Extraocular Movements: Extraocular movements intact.     Conjunctiva/sclera: Conjunctivae normal.     Pupils: Pupils are equal, round, and reactive to light.  Cardiovascular:     Pulses: Normal pulses.  Skin:    General: Skin is warm and dry.     Capillary Refill: Capillary refill takes less than 2 seconds.     Findings: Bruising present.  Neurological:     General: No focal deficit present.     Mental Status: She is alert and oriented to person, place, and time.  Psychiatric:        Mood and Affect: Mood normal.        Behavior: Behavior normal.        Thought Content: Thought content normal.        Judgment: Judgment normal.    Left hand exam PIP joint there is a cyst which appears to be a mucous cyst.  She has regained full range of motion whereas she could not bend the long finger before.  She has tenderness over the A1 pulley of the left long finger and tenderness and pain with range of motion of the [facility] joint of the left thumb  Right thumb we also have tenderness at the base of the joint of the [facility] area of the right thumb painful range of motion as well.  X-rays show [facility]MC arthritis of both thumbs arthritis of the PIP joint of the left long finger as well as other joints in the  hand  Impression  Encounter Diagnoses  Name Primary?   Bilateral hand pain Yes   Arthritis of carpometacarpal (CMC) joints of both thumbs    Trigger finger, left middle finger    Plan  We will inject the left long finger A1 pulley for the triggering    Trigger finger injection  Diagnosis tenosynovitis left long finger Procedure injection A1 pulley Medications lidocaine 1% 1 mL and Depo-Medrol 40 mg 1 mL Skin prep alcohol and ethyl chloride Verbal consent was obtained Timeout confirmed the injection site    After cleaning the skin with alcohol and anesthetizing the skin with ethyl chloride the A1 pulley was palpated and the injection was performed without complication  We will get functional dynamic splints for the thumb  She can continue the hot wax for improvement in range of motion and symptomatic relief  She is in the midst of a cardiac workup so other than occasional Aleve we will avoid any prescription NSAIDs  Follow-up in 6 weeks

## 2022-05-09 NOTE — Progress Notes (Unsigned)
VIRTUAL VISIT via [place]   I connected with Debra Harris  on 05/10/22 at  3:19 PM by telephone and verified that I am speaking with the correct person using two identifiers.  Location: Patient: Home Provider: [facility]   I discussed the limitations, risks, security and privacy concerns of performing an evaluation and management service by telephone and the availability of in person appointments. I also discussed with the patient that there may be a patient responsible charge related to this service. The patient expressed understanding and agreed to proceed.  REASON FOR VISIT: Iron deficiency anemia  CURRENT THERAPY: Intermittent IV iron  INTERVAL HISTORY:  [name] is contacted today for follow-up of her iron deficiency anemia.  She was last evaluated via telemedicine visit by Tarri Abernethy PA-C on 01/24/2022.  Most recent IV iron with Feraheme x 2 on 01/26/2022 and 02/02/2022.  At today's visit, she reports feeling fatigued from her recent move.  Otherwise, she reported feeling improved energy after her IV iron in November/December 2023, started feeling tired again over the past month.  She reports that her rectal bleeding occurs intermittently in "clusters," but that she can go several weeks in between episodes - last episode was over a month ago.  She follows with GI (Dr. Gala Romney) on an as-needed basis.  She reports fatigue, headaches, and dyspnea on exertion.  No pica, restless legs, chest pain, lightheadedness, or syncope.  She is unable to tolerate iron tablet due to constipation that worsens her rectal bleeding.  She is taking vitamin D daily.  She has 25% energy and 100% appetite. She endorses that she is maintaining a stable weight.  REVIEW OF SYSTEMS:   Review of Systems  Constitutional:  Positive for malaise/fatigue. Negative for chills, diaphoresis, fever and weight loss.  Respiratory:  Negative for cough and  shortness of breath.   Cardiovascular:  Negative for chest pain and palpitations.  Gastrointestinal:  Negative for abdominal pain, blood in stool, melena, nausea and vomiting.  Musculoskeletal:  Positive for myalgias (Muscle aches after moving).  Neurological:  Positive for dizziness, tingling and headaches.  Psychiatric/Behavioral:  The patient has insomnia.      PHYSICAL EXAM: (per limitations of virtual telephone visit)  The patient is alert and oriented x 3, exhibiting adequate mentation, good mood, and ability to speak in full sentences and execute sound judgement.  ASSESSMENT & PLAN:  1.  Iron deficiency anemia: - Iron deficiency state from combination of malabsorption and chronic GI loss. - Colonoscopy on 09/13/2015 showing diverticuli in the sigmoid colon, descending colon and transverse colon, internal and external hemorrhoids banded.  - Last received IV iron with Feraheme x2 in November/December 2023 - She continues to have on and off rectal bleeding, thought to be secondary to internal hemorrhoids - Symptomatic with fatigue and dyspnea on exertion - Most recent labs (04/24/2022): Hgb 11.7/MCV 88.8, ferritin 51, iron saturation 14% with TIBC 405 - PLAN: Patient may benefit from additional IV Feraheme due to persistent symptomatic iron deficiency.  However, she would like to hold off on IV iron and see if she feels better once she recovers from her recent move.  She will call us if she feels that she needs any IV iron before her next visit. - Otherwise, we will check CBC and iron panel with phone visit in 3 months.  2.  Vitamin D deficiency: - Vitamin D level was 19.96 (January 2022), and she has been  taking vitamin D 2000 units daily since that time - increased to vitamin D 4000 units in November 2023 -- Most recent Vitamin D (04/24/2022): Improved at 31.78 - PLAN: Continue vitamin D 4000 units daily.  Will recheck vitamin D in 6 months   PLAN SUMMARY: >> Labs in 3 months = CBC/D,  ferritin, iron/TIBC >> PHONE visit after labs     I discussed the assessment and treatment plan with the patient. The patient was provided an opportunity to ask questions and all were answered. The patient agreed with the plan and demonstrated an understanding of the instructions.   The patient was advised to call back or seek an in-person evaluation if the symptoms worsen or if the condition fails to improve as anticipated.  I provided 18 minutes of non-face-to-face time during this encounter.   Harriett Rush, PA-C 05/10/22 3:35 PM

## 2022-05-10 ENCOUNTER — Encounter: Payer: Self-pay | Admitting: Physician Assistant

## 2022-05-10 ENCOUNTER — Other Ambulatory Visit: Payer: Self-pay

## 2022-05-10 ENCOUNTER — Inpatient Hospital Stay: Payer: Medicare Other | Attending: Physician Assistant | Admitting: Physician Assistant

## 2022-05-10 DIAGNOSIS — E559 Vitamin D deficiency, unspecified: Secondary | ICD-10-CM

## 2022-05-10 DIAGNOSIS — D500 Iron deficiency anemia secondary to blood loss (chronic): Secondary | ICD-10-CM | POA: Diagnosis not present

## 2022-05-11 ENCOUNTER — Encounter (HOSPITAL_COMMUNITY): Payer: Self-pay | Admitting: Cardiology

## 2022-05-17 ENCOUNTER — Ambulatory Visit: Payer: Medicare Other | Admitting: Family Medicine

## 2022-05-19 DIAGNOSIS — K641 Second degree hemorrhoids: Secondary | ICD-10-CM | POA: Diagnosis not present

## 2022-05-25 ENCOUNTER — Ambulatory Visit: Payer: Medicare Other | Admitting: Family Medicine

## 2022-05-31 ENCOUNTER — Ambulatory Visit (HOSPITAL_COMMUNITY): Payer: Medicare Other | Admitting: Occupational Therapy

## 2022-06-22 ENCOUNTER — Other Ambulatory Visit: Payer: Self-pay | Admitting: Orthopedic Surgery

## 2022-06-22 DIAGNOSIS — G8929 Other chronic pain: Secondary | ICD-10-CM

## 2022-06-22 MED ORDER — HYDROCODONE-ACETAMINOPHEN 5-325 MG PO TABS: 1.0000 | ORAL_TABLET | Freq: Four times a day (QID) | ORAL | 0 refills | Status: DC | PRN

## 2022-06-22 NOTE — Telephone Encounter (Signed)
Dr. Mort Sawyers patient - the patient called back and stated that her medication was sent to [facility] in [place], she needs it to the one in [place], the information is in the original message from earlier today.

## 2022-06-22 NOTE — Telephone Encounter (Signed)
Dr. Mort Sawyers pt - spoke w/the patient, she is leaving to go out of town and is hurting more.  She is requesting a refill on Hydrocodone 5-325, 30 quantity, every 6 hours PRN for moderate pain to be sent to [facility], [address] [place], [place] [address] [phone].

## 2022-06-22 NOTE — Telephone Encounter (Signed)
I missed the pharmacy change, apologies Have now selected correct pharmacy, please resend

## 2022-06-23 ENCOUNTER — Telehealth: Payer: Self-pay | Admitting: Orthopedic Surgery

## 2022-06-23 NOTE — Telephone Encounter (Signed)
Dr. Hilda Lias called at 4:47 pm on 06/22/22 about [facility] medicine clinic is aware   I called Dr. Hilda Lias this morning to let him know which [facility] the RX was sent to and the time.  He said he appreciate me calling to let him know.  [name] does have her medicine.

## 2022-06-28 HISTORY — PX: HEMORRHOID BANDING: SHX5850

## 2022-07-07 DIAGNOSIS — K648 Other hemorrhoids: Secondary | ICD-10-CM | POA: Diagnosis not present

## 2022-07-25 ENCOUNTER — Other Ambulatory Visit: Payer: Self-pay | Admitting: Orthopedic Surgery

## 2022-07-25 DIAGNOSIS — M545 Low back pain, unspecified: Secondary | ICD-10-CM

## 2022-07-25 DIAGNOSIS — R29898 Other symptoms and signs involving the musculoskeletal system: Secondary | ICD-10-CM

## 2022-08-01 ENCOUNTER — Inpatient Hospital Stay: Payer: Medicare Other | Attending: Physician Assistant

## 2022-08-01 DIAGNOSIS — D509 Iron deficiency anemia, unspecified: Secondary | ICD-10-CM | POA: Insufficient documentation

## 2022-08-01 DIAGNOSIS — E559 Vitamin D deficiency, unspecified: Secondary | ICD-10-CM | POA: Insufficient documentation

## 2022-08-01 DIAGNOSIS — D500 Iron deficiency anemia secondary to blood loss (chronic): Secondary | ICD-10-CM

## 2022-08-01 LAB — CBC WITH DIFFERENTIAL/PLATELET
Abs Immature Granulocytes: 0.02 10*3/uL (ref 0.00–0.07)
Basophils Absolute: 0 10*3/uL (ref 0.0–0.1)
Basophils Relative: 1 %
Eosinophils Absolute: 0.2 10*3/uL (ref 0.0–0.5)
Eosinophils Relative: 3 %
HCT: 40.5 % (ref 36.0–46.0)
Hemoglobin: 12.9 g/dL (ref 12.0–15.0)
Immature Granulocytes: 0 %
Lymphocytes Relative: 26 %
Lymphs Abs: 1.3 10*3/uL (ref 0.7–4.0)
MCH: 28 pg (ref 26.0–34.0)
MCHC: 31.9 g/dL (ref 30.0–36.0)
MCV: 87.9 fL (ref 80.0–100.0)
Monocytes Absolute: 0.4 10*3/uL (ref 0.1–1.0)
Monocytes Relative: 9 %
Neutro Abs: 3.1 10*3/uL (ref 1.7–7.7)
Neutrophils Relative %: 61 %
Platelets: 240 10*3/uL (ref 150–400)
RBC: 4.61 MIL/uL (ref 3.87–5.11)
RDW: 14.2 % (ref 11.5–15.5)
WBC: 5.1 10*3/uL (ref 4.0–10.5)
nRBC: 0 % (ref 0.0–0.2)

## 2022-08-01 LAB — FERRITIN: Ferritin: 36 ng/mL (ref 11–307)

## 2022-08-01 LAB — IRON AND TIBC
Iron: 52 ug/dL (ref 28–170)
Saturation Ratios: 12 % (ref 10.4–31.8)
TIBC: 451 ug/dL — ABNORMAL HIGH (ref 250–450)
UIBC: 399 ug/dL

## 2022-08-04 ENCOUNTER — Inpatient Hospital Stay: Payer: Medicare Other

## 2022-08-07 ENCOUNTER — Encounter: Payer: Self-pay | Admitting: Orthopedic Surgery

## 2022-08-07 ENCOUNTER — Ambulatory Visit (INDEPENDENT_AMBULATORY_CARE_PROVIDER_SITE_OTHER): Payer: Medicare Other | Admitting: Orthopedic Surgery

## 2022-08-07 ENCOUNTER — Telehealth: Payer: Self-pay

## 2022-08-07 ENCOUNTER — Ambulatory Visit (HOSPITAL_COMMUNITY)
Admission: RE | Admit: 2022-08-07 | Discharge: 2022-08-07 | Disposition: A | Discharge status: Home / Self Care | Payer: Medicare Other | Source: Ambulatory Visit | Attending: Orthopedic Surgery | Admitting: Orthopedic Surgery

## 2022-08-07 ENCOUNTER — Ambulatory Visit (INDEPENDENT_AMBULATORY_CARE_PROVIDER_SITE_OTHER): Payer: Medicare Other

## 2022-08-07 VITALS — BP 155/89 | HR 68 | Ht 64.0 in | Wt 209.0 lb

## 2022-08-07 DIAGNOSIS — G8929 Other chronic pain: Secondary | ICD-10-CM | POA: Insufficient documentation

## 2022-08-07 DIAGNOSIS — M1711 Unilateral primary osteoarthritis, right knee: Secondary | ICD-10-CM | POA: Diagnosis not present

## 2022-08-07 DIAGNOSIS — M25561 Pain in right knee: Secondary | ICD-10-CM | POA: Insufficient documentation

## 2022-08-07 DIAGNOSIS — M5417 Radiculopathy, lumbosacral region: Secondary | ICD-10-CM | POA: Diagnosis not present

## 2022-08-07 DIAGNOSIS — M545 Low back pain, unspecified: Secondary | ICD-10-CM | POA: Diagnosis not present

## 2022-08-07 DIAGNOSIS — H401132 Primary open-angle glaucoma, bilateral, moderate stage: Secondary | ICD-10-CM | POA: Diagnosis not present

## 2022-08-07 DIAGNOSIS — Z961 Presence of intraocular lens: Secondary | ICD-10-CM | POA: Diagnosis not present

## 2022-08-07 DIAGNOSIS — E119 Type 2 diabetes mellitus without complications: Secondary | ICD-10-CM | POA: Diagnosis not present

## 2022-08-07 DIAGNOSIS — G629 Polyneuropathy, unspecified: Secondary | ICD-10-CM

## 2022-08-07 DIAGNOSIS — D3131 Benign neoplasm of right choroid: Secondary | ICD-10-CM | POA: Diagnosis not present

## 2022-08-07 DIAGNOSIS — H04123 Dry eye syndrome of bilateral lacrimal glands: Secondary | ICD-10-CM | POA: Diagnosis not present

## 2022-08-07 DIAGNOSIS — H43813 Vitreous degeneration, bilateral: Secondary | ICD-10-CM | POA: Diagnosis not present

## 2022-08-07 MED ORDER — GADOBUTROL 1 MMOL/ML IV SOLN
9.0000 mL | Freq: Once | INTRAVENOUS | Status: AC | PRN
  Administered 2022-08-07: 9 mL via INTRAVENOUS

## 2022-08-07 NOTE — Progress Notes (Signed)
Chief Complaint  Patient presents with   Knee Pain    Right knee pain and discuss surgery     72 year old female status post right knee arthroscopy many years ago with osteoarthritis of the right knee and worsening valgus deformity but actually complains of right leg pain with only mild knee pain and mild functional loss from the knee pain.  She is having right hip back and leg pain down into the foot.  Last year she had a discectomy for ruptured disc and foot drop on the left which has recovered nicely  However, she notes that since the surgery she is not able to return to her normal activities  Exam findings she has increasing valgus deformity on the right tenderness on the lateral compartment crepitance on range of motion but full extension excellent strength in an arc of flexion of 120 degrees with a stable knee  She has mild pain on the right side with flexion of the spine she can get down to about the mid tibial region on flexion she has no pain on extension mild pain on rotation and lateral bend of the right  She has tenderness in the anterolateral compartment of the right lower leg  Review of systems she is complaining of some neuropathy She would like a neurology consult for that  After discussion of her symptoms it seems to me that she is having more radicular pain than actual osteoarthritis pain and dysfunction   Encounter Diagnoses  Name Primary?   Chronic pain of right knee    Neuropathy    Primary osteoarthritis of right knee Yes   Lumbosacral radiculopathy at L5     Plan   I think we should address that prior to doing a knee replacement when the knee pain is not the primary complaint  X-rays show progressive valgus deformity and worsening osteoarthritis of the right knee with 3 compartment disease 16 degree valgus right knee  MRI to prepare for epidural injections to address the radicular pain on the right

## 2022-08-07 NOTE — Telephone Encounter (Signed)
Hey Dr Sanjuan Dame wife Debra Harris was here today and has to have MRI Lumbar spine at [facility] and she asked to have you schedule it because you can get her in pretty fast then contact her or Dr. Hilda Lias with the date and time

## 2022-08-08 NOTE — Progress Notes (Unsigned)
VIRTUAL VISIT via TELEPHONE NOTE [facility]   I connected with Debra Harris on 08/09/22 at  2:35 PM by telephone and verified that I am speaking with the correct person using two identifiers.  Location: Patient: Home Provider: [facility]   I discussed the limitations, risks, security and privacy concerns of performing an evaluation and management service by telephone and the availability of in person appointments. I also discussed with the patient that there may be a patient responsible charge related to this service. The patient expressed understanding and agreed to proceed.  REASON FOR VISIT: Iron deficiency anemia  CURRENT THERAPY: Intermittent IV iron  INTERVAL HISTORY:  [name] is contacted today for follow-up of her iron deficiency anemia.  She was last evaluated via telemedicine visit by Rojelio Brenner PA-C on 05/10/2022.  Most recent IV iron with Feraheme x 2 on 01/26/2022 and 02/02/2022.  At today's visit, she reports feeling fair.  She is remaining busy cleaning out her old house, but denies any abnormal fatigue.  She reports that her rectal bleeding occurs intermittently in "clusters," but that she can go several weeks in between episodes - last episode of significant bleeding was around March 2024.   She follows with GI and received hemorrhoid banding in April 2024, and has not had any bleeding since then.  She has some mild dyspnea on exertion.  No headaches, pica, restless legs, chest pain, lightheadedness, or syncope. She is unable to tolerate iron tablet due to constipation.  She is taking vitamin D daily. She has 75% energy and 100% appetite. She endorses that she is maintaining a stable weight.  REVIEW OF SYSTEMS:   Review of Systems  Constitutional:  Positive for malaise/fatigue (mild, about 75%). Negative for chills, diaphoresis, fever and weight loss.  Respiratory:  Negative for cough and shortness of breath.    Cardiovascular:  Positive for leg swelling. Negative for chest pain and palpitations.  Gastrointestinal:  Negative for abdominal pain, blood in stool, melena, nausea and vomiting.  Musculoskeletal:  Negative for joint pain and myalgias (Muscle aches after moving).  Neurological:  Negative for dizziness, tingling and headaches.  Psychiatric/Behavioral:  The patient does not have insomnia.      PHYSICAL EXAM: (per limitations of virtual telephone visit)  The patient is alert and oriented x 3, exhibiting adequate mentation, good mood, and ability to speak in full sentences and execute sound judgement.  ASSESSMENT & PLAN:  1.  Iron deficiency anemia: - Iron deficiency state from combination of malabsorption and chronic GI loss. - Colonoscopy on 09/13/2015 showing diverticuli in the sigmoid colon, descending colon and transverse colon, internal and external hemorrhoids banded.  - Last received IV iron with Feraheme x2 in November/December 2023 - She had prior issues with rectal bleeding, thought to be secondary to internal hemorrhoids.  She has not had any recurrent rectal bleeding since hemorrhoid banding in April 2024. - Symptomatic with fatigue and dyspnea on exertion - Most recent labs (08/01/2022): Hgb 12.9/MCV 87.9 ferritin 36, iron saturation 12% with elevated TIBC 451 - PLAN: Patient qualifies for IV iron repletion, but declines at this time. - Recommended that she retry oral iron supplementation with ferrous bisglycinate (gentle iron) to see if she tolerates this better. -  She will call us if she feels that she needs any IV iron before her next visit. - Otherwise, we will check CBC and iron panel with OFFICE visit in 4 months.  2.  Vitamin  D deficiency: - Vitamin D level was 19.96 (January 2022), and she has been taking vitamin D 2000 units daily since that time - increased to vitamin D 4000 units in November 2023 -- Most recent Vitamin D (04/24/2022): Improved at 31.78 - PLAN: Continue  vitamin D 4000 units daily.  Will recheck vitamin D every 6 months (next due around September 2024)   PLAN SUMMARY: >> Labs in 4 months = CBC/D, ferritin, iron/TIBC, vitamin D >> OFFICE visit in 4 months (after labs)     I discussed the assessment and treatment plan with the patient. The patient was provided an opportunity to ask questions and all were answered. The patient agreed with the plan and demonstrated an understanding of the instructions.   The patient was advised to call back or seek an in-person evaluation if the symptoms worsen or if the condition fails to improve as anticipated.  I provided 22 minutes of non-face-to-face time during this encounter.  Carnella Guadalajara, PA-C 08/09/22 2:59 PM

## 2022-08-09 ENCOUNTER — Encounter: Payer: Self-pay | Admitting: Physician Assistant

## 2022-08-09 ENCOUNTER — Inpatient Hospital Stay (HOSPITAL_BASED_OUTPATIENT_CLINIC_OR_DEPARTMENT_OTHER): Payer: Medicare Other | Admitting: Physician Assistant

## 2022-08-09 DIAGNOSIS — D500 Iron deficiency anemia secondary to blood loss (chronic): Secondary | ICD-10-CM

## 2022-08-09 DIAGNOSIS — E559 Vitamin D deficiency, unspecified: Secondary | ICD-10-CM | POA: Diagnosis not present

## 2022-08-14 ENCOUNTER — Telehealth: Payer: Self-pay

## 2022-08-14 NOTE — Telephone Encounter (Signed)
Debra Harris, [name] called about her MRI and I checked her chart, it has not been read and she is wanting to set up her appt to come back and discuss treatment options with Dr. Romeo Apple. I told her that I would let you know.

## 2022-08-21 ENCOUNTER — Ambulatory Visit (INDEPENDENT_AMBULATORY_CARE_PROVIDER_SITE_OTHER): Payer: Medicare Other | Admitting: Orthopedic Surgery

## 2022-08-21 DIAGNOSIS — M5136 Other intervertebral disc degeneration, lumbar region: Secondary | ICD-10-CM | POA: Diagnosis not present

## 2022-08-21 DIAGNOSIS — M545 Low back pain, unspecified: Secondary | ICD-10-CM

## 2022-08-21 NOTE — Patient Instructions (Signed)
I have sent order for the injections to Dr Alvester Morin, he is at the [facility] on the second floor   We are referring you to [facility] from [employer] address is [address] [place] The phone number is [phone]  The office will call you with an appointment  Dr. Alvester Morin

## 2022-08-21 NOTE — Progress Notes (Signed)
Chief Complaint  Patient presents with   Follow-up    Recheck on back pain   Report and images reviewed independently  Preliminary reading was that she had a herniated disc on the right at L4-5  Report was read as follows   IMPRESSION: 1. Right subarticular zone disc protrusion at L3-L4 with likely impingement of the traversing right L4 nerve root. 2. Status post left laminectomy at L4-L5. Right subarticular zone narrowing which may impinge the traversing right L5 nerve root, and mild-to-moderate left neural foraminal stenosis with possible contact of the exiting left L4 nerve root. Moderate right facet arthropathy at this level with surrounding perifacetal soft tissue edema and enhancement which could reflect a source of pain. 3. Moderate right worse than left facet arthropathy at L5-S1 without significant spinal canal or neural foraminal stenosis.     Electronically Signed   By: Lesia Hausen M.D.   On: 08/15/2022 11:15   Encounter Diagnoses  Name Primary?   Lumbar pain Yes   DDD (degenerative disc disease), lumbar     Plan is for epidural injection  Follow-up 1 week after injection

## 2022-09-07 ENCOUNTER — Ambulatory Visit: Payer: Medicare Other | Admitting: Physical Medicine and Rehabilitation

## 2022-09-07 ENCOUNTER — Other Ambulatory Visit: Payer: Self-pay

## 2022-09-07 VITALS — BP 138/66 | HR 62

## 2022-09-07 DIAGNOSIS — M961 Postlaminectomy syndrome, not elsewhere classified: Secondary | ICD-10-CM

## 2022-09-07 DIAGNOSIS — M5416 Radiculopathy, lumbar region: Secondary | ICD-10-CM | POA: Diagnosis not present

## 2022-09-07 DIAGNOSIS — M48061 Spinal stenosis, lumbar region without neurogenic claudication: Secondary | ICD-10-CM

## 2022-09-07 MED ORDER — METHYLPREDNISOLONE ACETATE 80 MG/ML IJ SUSP
80.0000 mg | Freq: Once | INTRAMUSCULAR | Status: AC
Start: 2022-09-07 — End: 2022-09-07
  Administered 2022-09-07: 80 mg

## 2022-09-07 NOTE — Progress Notes (Signed)
Debra Harris - 72 y.o. female MRN [id number]  Date of birth: 04-11-1950  Office Visit Note: Visit Date: 09/07/2022 PCP: Carylon Perches, MD Referred by: Carylon Perches, MD  Subjective: Chief Complaint  Patient presents with   Lower Back - Pain   HPI:  Debra Harris is a 72 y.o. female who comes in today at the request of Dr. Fuller Canada for planned Right L5-S1 Lumbar Interlaminar epidural steroid injection with fluoroscopic guidance.  The patient has failed conservative care including home exercise, medications, time and activity modification.  This injection will be diagnostic and hopefully therapeutic.  Please see requesting physician notes for further details and justification.    ROS Otherwise per HPI.  Assessment & Plan: Visit Diagnoses:    ICD-10-CM   1. Lumbar radiculopathy  M54.16 XR C-ARM NO REPORT    Epidural Steroid injection    methylPREDNISolone acetate (DEPO-MEDROL) injection 80 mg    2. Stenosis of lateral recess of lumbar spine  M48.061     3. Post laminectomy syndrome  M96.1       Plan: No additional findings.   Meds & Orders:  Meds ordered this encounter  Medications   methylPREDNISolone acetate (DEPO-MEDROL) injection 80 mg    Orders Placed This Encounter  Procedures   XR C-ARM NO REPORT   Epidural Steroid injection    Follow-up: No follow-ups on file.   Procedures: No procedures performed  Lumbar Epidural Steroid Injection - Interlaminar Approach with Fluoroscopic Guidance  Patient: Debra Harris      Date of Birth: [birth year] MRN: [id number] PCP: Carylon Perches, MD      Visit Date: 09/07/2022   Universal Protocol:     Consent Given By: the patient  Position: PRONE  Additional Comments: Vital signs were monitored before and after the procedure. Patient was prepped and draped in the usual sterile fashion. The correct patient, procedure, and site was verified.   Injection Procedure Details:   Procedure diagnoses: Lumbar  radiculopathy [M54.16]   Meds Administered:  Meds ordered this encounter  Medications   methylPREDNISolone acetate (DEPO-MEDROL) injection 80 mg     Laterality: Right  Location/Site:  L5-S1  Needle: 3.5 in., 20 ga. Tuohy  Needle Placement: Paramedian epidural  Findings:   -Comments: Excellent flow of contrast into the epidural space.  Procedure Details: Using a paramedian approach from the side mentioned above, the region overlying the inferior lamina was localized under fluoroscopic visualization and the soft tissues overlying this structure were infiltrated with 4 ml. of 1% Lidocaine without Epinephrine. The Tuohy needle was inserted into the epidural space using a paramedian approach.   The epidural space was localized using loss of resistance along with counter oblique bi-planar fluoroscopic views.  After negative aspirate for air, blood, and CSF, a 2 ml. volume of Isovue-250 was injected into the epidural space and the flow of contrast was observed. Radiographs were obtained for documentation purposes.    The injectate was administered into the level noted above.   Additional Comments:  The patient tolerated the procedure well Dressing: 2 x 2 sterile gauze and Band-Aid    Post-procedure details: Patient was observed during the procedure. Post-procedure instructions were reviewed.  Patient left the clinic in stable condition.   Clinical History: MRI LUMBAR SPINE WITHOUT AND WITH CONTRAST   TECHNIQUE: Multiplanar and multiecho pulse sequences of the lumbar spine were obtained without and with intravenous contrast.   CONTRAST:  9mL GADAVIST GADOBUTROL 1 MMOL/ML IV SOLN   COMPARISON:  Lumbar spine radiographs 04/04/2021, MRI 10/11/2012   FINDINGS: Segmentation: Standard; the lowest formed disc space is designated L5-S1.   Alignment:  Normal.   Vertebrae: Vertebral body heights are preserved. Background marrow signal is normal. There is small intraosseous  hemangioma in the L3 vertebral body. There is no suspicious marrow signal abnormality. There is no abnormal marrow edema or enhancement.   Conus medullaris and cauda equina: Conus extends to the L1 level. Conus and cauda equina appear normal.   Paraspinal and other soft tissues: Postsurgical edema and enhancement is noted in the soft tissues posterior to the L4-L5 level. There is mild perifacetal soft tissue edema and enhancement on the right at L4-L5.   Disc levels:   There is mild disc desiccation and narrowing at L3-L4 through L5-S1, slightly progressed since 2014   T12-L1: No significant spinal canal or neural foraminal stenosis   L1-L2: No significant spinal canal or neural foraminal stenosis   L2-L3: No significant spinal canal or neural foraminal stenosis.   L3-L4: There is a mild disc bulge with superimposed broad-based right subarticular zone protrusion and mild right worse than left facet arthropathy with ligamentum flavum thickening resulting in mild spinal canal stenosis and right subarticular zone effacement with likely impingement of the traversing right L4 nerve root (8-18), and no significant neural foraminal stenosis.   L4-L5: Status post left laminectomy. There is a disc bulge with a superimposed left foraminal/extraforaminal protrusion and moderate right and mild left facet arthropathy resulting in right subarticular zone narrowing which may impinge the traversing right L5 nerve root, mild-to-moderate left neural foraminal stenosis with possible contact of the exiting left L4 nerve root, and no significant right neural foraminal stenosis.   L5-S1: Moderate right worse than left facet arthropathy with a possible small osseous spur arising from the left lamina (8-26) common but no significant spinal canal or neural foraminal stenosis.   IMPRESSION: 1. Right subarticular zone disc protrusion at L3-L4 with likely impingement of the traversing right L4 nerve  root. 2. Status post left laminectomy at L4-L5. Right subarticular zone narrowing which may impinge the traversing right L5 nerve root, and mild-to-moderate left neural foraminal stenosis with possible contact of the exiting left L4 nerve root. Moderate right facet arthropathy at this level with surrounding perifacetal soft tissue edema and enhancement which could reflect a source of pain. 3. Moderate right worse than left facet arthropathy at L5-S1 without significant spinal canal or neural foraminal stenosis.     Electronically Signed   By: Lesia Hausen M.D.   On: 08/15/2022 11:15     Objective:  VS:  HT:    WT:   BMI:     BP:138/66  HR:62bpm  TEMP: ( )  RESP:  Physical Exam Vitals and nursing note reviewed.  Constitutional:      General: She is not in acute distress.    Appearance: Normal appearance. She is not ill-appearing.  HENT:     Head: Normocephalic and atraumatic.     Right Ear: External ear normal.     Left Ear: External ear normal.  Eyes:     Extraocular Movements: Extraocular movements intact.  Cardiovascular:     Rate and Rhythm: Normal rate.     Pulses: Normal pulses.  Pulmonary:     Effort: Pulmonary effort is normal. No respiratory distress.  Abdominal:     General: There is no distension.     Palpations: Abdomen is soft.  Musculoskeletal:        General:  Tenderness present.     Cervical back: Neck supple.     Right lower leg: No edema.     Left lower leg: No edema.     Comments: Patient has good distal strength with no pain over the greater trochanters.  No clonus or focal weakness.  Skin:    Findings: No erythema, lesion or rash.  Neurological:     General: No focal deficit present.     Mental Status: She is alert and oriented to person, place, and time.     Sensory: No sensory deficit.     Motor: No weakness or abnormal muscle tone.     Coordination: Coordination normal.  Psychiatric:        Mood and Affect: Mood normal.        Behavior:  Behavior normal.      Imaging: No results found.

## 2022-09-07 NOTE — Patient Instructions (Signed)

## 2022-09-07 NOTE — Procedures (Signed)
Lumbar Epidural Steroid Injection - Interlaminar Approach with Fluoroscopic Guidance  Patient: Debra Harris      Date of Birth: 1950/12/07 MRN: [id number] PCP: Carylon Perches, MD      Visit Date: 09/07/2022   Universal Protocol:     Consent Given By: the patient  Position: PRONE  Additional Comments: Vital signs were monitored before and after the procedure. Patient was prepped and draped in the usual sterile fashion. The correct patient, procedure, and site was verified.   Injection Procedure Details:   Procedure diagnoses: Lumbar radiculopathy [M54.16]   Meds Administered:  Meds ordered this encounter  Medications   methylPREDNISolone acetate (DEPO-MEDROL) injection 80 mg     Laterality: Right  Location/Site:  L5-S1  Needle: 3.5 in., 20 ga. Tuohy  Needle Placement: Paramedian epidural  Findings:   -Comments: Excellent flow of contrast into the epidural space.  Procedure Details: Using a paramedian approach from the side mentioned above, the region overlying the inferior lamina was localized under fluoroscopic visualization and the soft tissues overlying this structure were infiltrated with 4 ml. of 1% Lidocaine without Epinephrine. The Tuohy needle was inserted into the epidural space using a paramedian approach.   The epidural space was localized using loss of resistance along with counter oblique bi-planar fluoroscopic views.  After negative aspirate for air, blood, and CSF, a 2 ml. volume of Isovue-250 was injected into the epidural space and the flow of contrast was observed. Radiographs were obtained for documentation purposes.    The injectate was administered into the level noted above.   Additional Comments:  The patient tolerated the procedure well Dressing: 2 x 2 sterile gauze and Band-Aid    Post-procedure details: Patient was observed during the procedure. Post-procedure instructions were reviewed.  Patient left the clinic in stable condition.

## 2022-09-07 NOTE — Progress Notes (Signed)
Functional Pain Scale - descriptive words and definitions  Distressing (6)    Pain is present/unable to complete most ADLs limited by pain/sleep is difficult and active distraction is only marginal. Moderate range order  Average Pain 7   +Driver, -BT, -Dye Allergies.  Lower back pain on right side

## 2022-10-10 ENCOUNTER — Encounter: Payer: Self-pay | Admitting: Pulmonary Disease

## 2022-10-10 ENCOUNTER — Ambulatory Visit: Payer: Medicare Other | Admitting: Pulmonary Disease

## 2022-10-10 VITALS — BP 118/66 | HR 65 | Ht 64.0 in | Wt 207.0 lb

## 2022-10-10 DIAGNOSIS — G4733 Obstructive sleep apnea (adult) (pediatric): Secondary | ICD-10-CM | POA: Diagnosis not present

## 2022-10-10 NOTE — Progress Notes (Signed)
[facility]  Chief Complaint  Patient presents with   Follow-up    Breathing is about the same. She is doing well with her CPAP.     Past Surgical History:  She  has a past surgical history that includes Partial hysterectomy (age 51s); Oophorectomy (age 57s); Cholecystectomy; Carpal tunnel release (Bilateral); thumb surger (Bilateral); Knee surgery; Cataract extraction; Colonoscopy (2005); Colonoscopy (2010); Colonoscopy (2014); Esophagogastroduodenoscopy (2010); Esophagogastroduodenoscopy (egd) with propofol (N/A, 10/26/2014); Esophageal dilation (N/A, 10/26/2014); Givens capsule study (N/A, 10/26/2014); Rectal exam under anesthesia (N/A, 10/26/2014); Hemorrhoid banding; Colonoscopy with propofol (N/A, 09/13/2015); Esophagogastroduodenoscopy (egd) with propofol (N/A, 09/13/2015); and TEE without cardioversion (N/A, 04/27/2022).  Past Medical History:  Colon polyps, Allergies, OA, Back pain, DM, Diverticulosis, Fibromyalgia, GERD, Glaucoma, IBS, Iron deficiency anemia, Neuropathy, Hiatal hernia, COVID December 2022  Constitutional:  BP 118/66 (BP Location: Left Arm, Cuff Size: Large)   Pulse 65   Ht 5\' 4"  (1.626 m)   Wt 207 lb (93.9 kg)   SpO2 97% Comment: on RA  BMI 35.53 kg/m   Brief Summary:  Debra Harris is a 71 y.o. female with obstructive sleep apnea and dyspnea.      Subjective:   PFT normal.  CT chest only showed mild air trapping.  No benefit from trial of inhaler therapy.  She has trouble breathing through her nose.  Broke her nose as a child and has deviated septum.  She has changed her diet and is exercising more.  Not having cough, wheeze, or sputum. She travels to [place] frequently.  She has a spare CPAP there that she uses in [place].  No issues with mask fit or pressure setting.  Physical Exam:   Appearance - well kempt   ENMT - no sinus tenderness, no oral exudate, no LAN, Mallampati 3 airway, no stridor  Respiratory - equal  breath sounds bilaterally, no wheezing or rales  CV - s1s2 regular rate and rhythm, no murmurs  Ext - no clubbing, no edema  Skin - no rashes  Psych - normal mood and affect   Pulmonary Tests:  FeNO 12/20/21 >> 10 PFT 02/06/22 >> FEV1 2.23 (95%), FEV1% 84, TLC 5.03 (96%), DLCO 93%  Chest Imaging:  HRCT chest 02/04/22 >> areas of scarring, mild air trapping  Sleep Tests:  PSG 11/25/10 >> AHI 68.6, SpO2 low 81% Auto CPAP 09/10/22 to 10/09/22 >> used on 18 of 30 nights with average 7 hrs 31 min.  Average AH 0.5 with median CPAP 8 and 95 th percentile CPAP 11 cm H2O  Cardiac Tests:  Echo 02/15/22 >> EF 65 to 70% TEE 04/27/22 >> EF 60 to 65%, mild/mod MR  Social History:  She  reports that she has never smoked. She has never used smokeless tobacco. She reports that she does not currently use alcohol after a past usage of about 1.0 standard drink of alcohol per week. She reports that she does not use drugs.  Family History:  Her family history includes Depression in her mother; Diabetes in her mother; Glaucoma in her maternal grandmother and mother; Heart disease in her maternal grandfather, maternal grandmother, and mother; Obesity in her mother; Sudden death in her mother; Ulcers in her mother.     Assessment/Plan:   Dyspnea on exertion with cough and wheezing. - likely related to deconditioning after back injury and COVID - improved - no additional pulmonary testing needed at this time  Obstructive sleep apnea. - she is compliant with therapy and  reports benefit from CPAP - she uses [employer] for her DME - current CPAP ordered January 2023 - continue auto CPAP 5 to 12 cm H2O   Coronary artery disease. - followed by Dr. Patrick Jupiter with cardiology  Time Spent Involved in Patient Care on Day of Examination:  27 minutes  Follow up:   Patient Instructions  Follow up in 1 year  Medication List:   Allergies as of 10/10/2022       Reactions   Injectafer [ferric  Carboxymaltose] Other (See Comments)   "Felt faint" "woozy" Has tolerated feraheme without problems   Tizanidine Swelling   Tongue swelling         Medication List        Accurate as of October 10, 2022  3:29 PM. If you have any questions, ask your nurse or doctor.          ASHWAGANDHA PO Take 1 capsule by mouth every 14 (fourteen) days.   Benefiber Powd Take 1 Dose by mouth daily.   cyclobenzaprine 5 MG tablet Commonly known as: FLEXERIL Take 1 tablet (5 mg total) by mouth 3 (three) times daily as needed for muscle spasms.   HYDROcodone-acetaminophen 5-325 MG tablet Commonly known as: NORCO/VICODIN Take 1 tablet by mouth every 6 (six) hours as needed for moderate pain.   Lumigan 0.01 % Soln Generic drug: bimatoprost Place 1 drop into both eyes at bedtime.   MAGNESIUM PO Take 1 tablet by mouth at bedtime.   metFORMIN 500 MG tablet Commonly known as: GLUCOPHAGE Take 500 mg by mouth 2 (two) times daily with a meal.   naproxen sodium 220 MG tablet Commonly known as: ALEVE Take 220 mg by mouth daily as needed (pain.).   NON FORMULARY CPAP at night   Tylenol PM Extra Strength 500-25 MG Tabs tablet Generic drug: diphenhydramine-acetaminophen Take 1 tablet by mouth at bedtime.   ZINC PO Take 1 tablet by mouth in the morning.        Signature:  Coralyn Helling, MD [facility]monary/Critical Care Pager - [phone] 10/10/2022, 3:29 PM

## 2022-10-10 NOTE — Patient Instructions (Signed)
Follow up in 1 year.

## 2022-11-08 ENCOUNTER — Encounter: Payer: Self-pay | Admitting: Cardiology

## 2022-11-08 ENCOUNTER — Ambulatory Visit: Payer: Medicare Other | Attending: Cardiology | Admitting: Cardiology

## 2022-11-08 VITALS — BP 118/70 | HR 68 | Ht 64.0 in | Wt 209.2 lb

## 2022-11-08 DIAGNOSIS — R0602 Shortness of breath: Secondary | ICD-10-CM | POA: Insufficient documentation

## 2022-11-08 DIAGNOSIS — E782 Mixed hyperlipidemia: Secondary | ICD-10-CM | POA: Diagnosis present

## 2022-11-08 DIAGNOSIS — I2510 Atherosclerotic heart disease of native coronary artery without angina pectoris: Secondary | ICD-10-CM | POA: Insufficient documentation

## 2022-11-08 MED ORDER — ATORVASTATIN CALCIUM 40 MG PO TABS: 40.0000 mg | ORAL_TABLET | Freq: Every day | ORAL | 3 refills | Status: DC

## 2022-11-08 NOTE — Patient Instructions (Signed)
Medication Instructions:  Your physician has recommended you make the following change in your medication:   -Start Atorvastatin 40 mg tablet once daily.   *If you need a refill on your cardiac medications before your next appointment, please call your pharmacy*   Lab Work: None If you have labs (blood work) drawn today and your tests are completely normal, you will receive your results only by: MyChart Message (if you have MyChart) OR A paper copy in the mail If you have any lab test that is abnormal or we need to change your treatment, we will call you to review the results.   Testing/Procedures: None   Follow-Up: At [facility], you and your health needs are our priority.  As part of our continuing mission to provide you with exceptional heart care, we have created designated Provider Care Teams.  These Care Teams include your primary Cardiologist (physician) and Advanced Practice Providers (APPs -  Physician Assistants and Nurse Practitioners) who all work together to provide you with the care you need, when you need it.  We recommend signing up for the patient portal called "MyChart".  Sign up information is provided on this After Visit Summary.  MyChart is used to connect with patients for Virtual Visits (Telemedicine).  Patients are able to view lab/test results, encounter notes, upcoming appointments, etc.  Non-urgent messages can be sent to your provider as well.   To learn more about what you can do with MyChart, go to [email or web address].    Your next appointment:   1 year(s)  Provider:   You may see Dina Rich, MD or one of the following Advanced Practice Providers on your designated Care Team:   Randall An, PA-C  Jacolyn Reedy, [place]     Other Instructions

## 2022-11-08 NOTE — Progress Notes (Signed)
Clinical Summary Debra Harris is a 72 y.o.female seen today   1.DOE/LE edema - followed by pulmonary - from notes progressive symptoms since COVID 01/2021 - High-resolution CT chest showed minimal scarring bilateral lung bases. No evidence of fibrotic interstitial lung disease. She had mild lobular air trapping consistent with small airway disease.  01/2022 BNP 36     - SOB can occur at any time. Sporadic no specific pattern - sedentary lifestyle since back surgery in June     - recent chest pains started beginning of this year. Midchest, can occur at rest or with activity. Feeling of heaviness on chest, can have some associated SOB, occasional pain left or right arm. 5-6/10 in severity, can sometimes be positional. Unsure of duration.      - She underwent a low risk nuclear stress test on 01/03/2018 with no evidence of myocardial ischemia or scar, LVEF 68%.  - Echocardiogram on 12/31/2017 demonstrated normal left ventricular systolic and diastolic function and normal regional wall motion, LVEF 60 to 65%. There was moderate focal basal septal hypertrophy and mild tricuspid regurgitation.         - 01/2022 echo: LVEF 65-70%, no WMAs, normal diastolic fxn, no significant valve pathology  -Jan 2024 coronary CTA: LAD 50-69%, ? LA appendage thrombus. LAD not significant by FFR  -03/2022 TEE; no LA appendage thrombus, mild to mod MR - from pulm note SOB thought secondary to prior COVID and deconitioning.    2.Coronary atheroscerlsosis  -Jan 2024 coronary CTA: LAD 50-69%, ? LA appendage thrombus. LAD not significant by FFR  04/2021 TC 219 TG 163 HDL 48 LDL 142  - reports prior history of GI bleed/rectal bleeding. Still has intermittent bleeding at times - she started atorvastatin 40mg  but stopped on her own since last visit, denies any side effects  3. Prediabetes - she is on metformin - followed by pcp     Soc Hx: Her husband is Dr. Darreld Mclean, an orthopedic surgeon. 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  Past Medical History:  Diagnosis Date   Adenomatous colon polyp    Allergic rhinitis    Arthritis    Back pain    Constipation    Diabetes mellitus    Diverticulosis    External hemorrhoids    Fibromyalgia    Gallbladder problem    GERD (gastroesophageal reflux disease)    Glaucoma    Glaucoma    Hemorrhoids    grade 3    Hip pain    IBS (irritable bowel syndrome)    IDA (iron deficiency anemia)    Iron deficiency anemia 06/05/2014   Irregular heartbeat    Leg edema    Leg numbness    Leg pain    Low tension glaucoma    Neuropathy, leg    Ovarian cyst 11/20/00   Prediabetes    Sleep apnea    CPAP   Sliding hiatal hernia      Allergies  Allergen Reactions   Injectafer [Ferric Carboxymaltose] Other (See Comments)    "Felt faint" "woozy" Has tolerated feraheme without problems   Tizanidine Swelling    Tongue swelling      Current Outpatient Medications  Medication Sig Dispense Refill   ASHWAGANDHA PO Take 1 capsule by mouth every 14 (fourteen) days. (Patient not taking: Reported on 10/10/2022)     cyclobenzaprine (FLEXERIL) 5 MG tablet Take 1 tablet (5 mg total)  by mouth 3 (three) times daily as needed for muscle spasms. 30 tablet 0   diphenhydramine-acetaminophen (TYLENOL PM EXTRA STRENGTH) 25-500 MG TABS tablet Take 1 tablet by mouth at bedtime.     HYDROcodone-acetaminophen (NORCO/VICODIN) 5-325 MG tablet Take 1 tablet by mouth every 6 (six) hours as needed for moderate pain. 30 tablet 0   LUMIGAN 0.01 % SOLN Place 1 drop into both eyes at bedtime.     MAGNESIUM PO Take 1 tablet by mouth at bedtime.     metFORMIN (GLUCOPHAGE) 500 MG tablet Take 500 mg by mouth 2 (two) times daily with a meal.     Multiple Vitamins-Minerals (ZINC PO) Take 1 tablet by mouth in the morning.     naproxen sodium (ALEVE) 220 MG tablet Take 220 mg by mouth daily as needed (pain.).     NON FORMULARY CPAP at  night      Wheat Dextrin (BENEFIBER) POWD Take 1 Dose by mouth daily.     No current facility-administered medications for this visit.     Past Surgical History:  Procedure Laterality Date   CARPAL TUNNEL RELEASE Bilateral    CATARACT EXTRACTION     bilateral   CHOLECYSTECTOMY     COLONOSCOPY  2005   Small external hemorrhoids, single sigmoid colon diverticulum, small polyp at the hepatic flexure focally adenomatous.   COLONOSCOPY  2010   Dr. Jena Gauss, Versed 4/Demerol 75. Pancolonic diverticula, external hemorrhoids. Colonoscopy every 5 years for history of adenomatous polyps   COLONOSCOPY  2014   Dr. Juanda Chance: Propofol. mild diverticulosis in the sigmoid colon, random colon biopsies benign.   COLONOSCOPY WITH PROPOFOL N/A 09/13/2015   Procedure: COLONOSCOPY WITH PROPOFOL;  Surgeon: Corbin Ade, MD;  Location: [facility];  Service: Endoscopy;  Laterality: N/A;  1030   ESOPHAGEAL DILATION N/A 10/26/2014   Procedure: ESOPHAGEAL DILATION 54 FRENCH;  Surgeon: Corbin Ade, MD;  Location: [facility];  Service: Endoscopy;  Laterality: N/A;   ESOPHAGOGASTRODUODENOSCOPY  2010   Dr. Jena Gauss, distal esophageal erosions, Elease Hashimoto dilator passed. Comment small hiatal hernia, patulous EG junction.   ESOPHAGOGASTRODUODENOSCOPY (EGD) WITH PROPOFOL N/A 10/26/2014   Dr.Rourk- distal esophageal erosions c/w mild erosive reflux esophagitis. hiatal hernia, o/w normal EGD   ESOPHAGOGASTRODUODENOSCOPY (EGD) WITH PROPOFOL N/A 09/13/2015   Procedure: ESOPHAGOGASTRODUODENOSCOPY (EGD) WITH PROPOFOL;  Surgeon: Corbin Ade, MD;  Location: [facility];  Service: Endoscopy;  Laterality: N/A;   GIVENS CAPSULE STUDY N/A 10/26/2014   couple of mucosal breaks/erosions but essentially unremarkable small bowel study   HEMORRHOID BANDING     KNEE SURGERY     right-arthroscopy   OOPHORECTOMY  age 6s   PARTIAL HYSTERECTOMY  age 82s   RECTAL EXAM UNDER ANESTHESIA N/A 10/26/2014   circumferential grade 3 hemoorrhoids  easily reducible   TEE WITHOUT CARDIOVERSION N/A 04/27/2022   Procedure: TRANSESOPHAGEAL ECHOCARDIOGRAM (TEE);  Surgeon: Antoine Poche, MD;  Location: [facility];  Service: Endoscopy;  Laterality: N/A;  pt knows to arrive at 8:30   thumb surger Bilateral    trigger thumb     Allergies  Allergen Reactions   Injectafer [Ferric Carboxymaltose] Other (See Comments)    "Felt faint" "woozy" Has tolerated feraheme without problems   Tizanidine Swelling    Tongue swelling       Family History  Problem Relation Age of Onset   Ulcers Mother    Glaucoma Mother    Heart disease Mother    Diabetes Mother    Depression Mother  Sudden death Mother    Obesity Mother    Glaucoma Maternal Grandmother    Heart disease Maternal Grandmother    Heart disease Maternal Grandfather    Colon cancer Neg Hx      Social History Debra Harris reports that she has never smoked. She has never used smokeless tobacco. Debra Harris reports that she does not currently use alcohol after a past usage of about 1.0 standard drink of alcohol per week.   Review of Systems CONSTITUTIONAL: No weight loss, fever, chills, weakness or fatigue.  HEENT: Eyes: No visual loss, blurred vision, double vision or yellow sclerae.No hearing loss, sneezing, congestion, runny nose or sore throat.  SKIN: No rash or itching.  CARDIOVASCULAR: per hpi RESPIRATORY: No shortness of breath, cough or sputum.  GASTROINTESTINAL: No anorexia, nausea, vomiting or diarrhea. No abdominal pain or blood.  GENITOURINARY: No burning on urination, no polyuria NEUROLOGICAL: No headache, dizziness, syncope, paralysis, ataxia, numbness or tingling in the extremities. No change in bowel or bladder control.  MUSCULOSKELETAL: No muscle, back pain, joint pain or stiffness.  LYMPHATICS: No enlarged nodes. No history of splenectomy.  PSYCHIATRIC: No history of depression or anxiety.  ENDOCRINOLOGIC: No reports of sweating, cold or heat intolerance. No  polyuria or polydipsia.  Marland Kitchen   Physical Examination Today's Vitals   11/08/22 1114  BP: 118/70  Pulse: 68  SpO2: 98%  Weight: 209 lb 3.2 oz (94.9 kg)  Height: 5\' 4"  (1.626 m)   Body mass index is 35.91 kg/m.  Gen: resting comfortably, no acute distress HEENT: no scleral icterus, pupils equal round and reactive, no palptable cervical adenopathy,  CV: RRR, no mrg, no jvd Resp: Clear to auscultation bilaterally GI: abdomen is soft, non-tender, non-distended, normal bowel sounds, no hepatosplenomegaly MSK: extremities are warm, no edema.  Skin: warm, no rash Neuro:  no focal deficits Psych: appropriate affect   Diagnostic Studies  01/2022 echo 1. Left ventricular ejection fraction, by estimation, is 65 to 70%. The  left ventricle has normal function. The left ventricle has no regional  wall motion abnormalities. Left ventricular diastolic parameters were  normal. The average left ventricular  global longitudinal strain is -22.8 %. The global longitudinal strain is  normal.   2. Right ventricular systolic function is normal. The right ventricular  size is normal. There is normal pulmonary artery systolic pressure.   3. The mitral valve is normal in structure. Trivial mitral valve  regurgitation. No evidence of mitral stenosis.   4. The tricuspid valve is abnormal.   5. The aortic valve is tricuspid. Aortic valve regurgitation is not  visualized. No aortic stenosis is present.   6. The inferior vena cava is normal in size with greater than 50%  respiratory variability, suggesting right atrial pressure of 3 mmHg.    Jan 2024 coronary CTA IMPRESSION: 1. No significant extracardiac findings within the visualized lower chest.   2. Incomplete opacification of the LEFT atrial appendage. This could reflect underlying thrombus. Recommend correlation with risk factors. Consider further evaluation with dedicated TEE.   IMPRESSION: 1. Moderate CAD in proximal LAD, 50-69%  stenosis, CADRADS 3. CT FFR will be performed and reported separately. 2. Coronary calcium score is 56, which places the patient in the 62nd percentile for age and sex matched control. 3. Normal coronary origins with right dominance. 4. Left atrial appendage: Incomplete opacification of the distal left atrial appendage, cannot exclude thrombus.   RECOMMENDATIONS: CAD-RADS 3. Moderate stenosis. Consider symptom-guided anti-ischemic pharmacotherapy as well as risk  factor modification per guideline directed care. Additional analysis with CT FFR will be submitted.  FINDINGS: 1. Left Main: Low likelihood of hemodynamic significance.   2. LAD: Low likelihood of hemodynamic significance. FFR 0.91 beyond proximal LAD lesion. 3. LCX: Low likelihood of hemodynamic significance. 4. RCA: Low likelihood of hemodynamic significance.   IMPRESSION: 1.  CT FFR analysis showed no significant stenoses.   RECOMMENDATIONS: Guideline directed medical therapy for secondary prevention of CAD.    Assessment and Plan   1.Coronary atherosclerosis - noted on coronary CTA, not significant by FFR - restart statin. She has prior history of intermittent GI bleeding, will not start aspirin at this time  2. SOB - extensive cardiac workup as reported above was benign, no further cardiac testing planned at this time  3.HLD - restart atorvatatin 40mg  daily, goal LDL would be at least <100       Antoine Poche, M.D.

## 2022-11-22 ENCOUNTER — Other Ambulatory Visit: Payer: Self-pay | Admitting: Radiology

## 2022-11-22 MED ORDER — CYCLOBENZAPRINE HCL 5 MG PO TABS: 5.0000 mg | ORAL_TABLET | Freq: Three times a day (TID) | ORAL | 2 refills | Status: DC | PRN

## 2022-11-22 NOTE — Telephone Encounter (Signed)
Phone call, patient requesting flexeril to [facility]

## 2022-11-27 DIAGNOSIS — D509 Iron deficiency anemia, unspecified: Secondary | ICD-10-CM | POA: Diagnosis not present

## 2022-11-27 DIAGNOSIS — F329 Major depressive disorder, single episode, unspecified: Secondary | ICD-10-CM | POA: Diagnosis not present

## 2022-11-27 DIAGNOSIS — E1140 Type 2 diabetes mellitus with diabetic neuropathy, unspecified: Secondary | ICD-10-CM | POA: Diagnosis not present

## 2022-11-27 DIAGNOSIS — Z79899 Other long term (current) drug therapy: Secondary | ICD-10-CM | POA: Diagnosis not present

## 2022-11-27 DIAGNOSIS — E785 Hyperlipidemia, unspecified: Secondary | ICD-10-CM | POA: Diagnosis not present

## 2022-11-29 ENCOUNTER — Inpatient Hospital Stay: Payer: Medicare Other

## 2022-11-30 ENCOUNTER — Telehealth: Payer: Self-pay | Admitting: Physical Medicine and Rehabilitation

## 2022-11-30 NOTE — Telephone Encounter (Signed)
Patient called and said she needs another shot in the back. [phone]

## 2022-12-01 ENCOUNTER — Other Ambulatory Visit: Payer: Self-pay | Admitting: Physical Medicine and Rehabilitation

## 2022-12-01 DIAGNOSIS — G720 Drug-induced myopathy: Secondary | ICD-10-CM | POA: Diagnosis not present

## 2022-12-01 DIAGNOSIS — M5416 Radiculopathy, lumbar region: Secondary | ICD-10-CM

## 2022-12-01 DIAGNOSIS — E1140 Type 2 diabetes mellitus with diabetic neuropathy, unspecified: Secondary | ICD-10-CM | POA: Diagnosis not present

## 2022-12-01 DIAGNOSIS — Z23 Encounter for immunization: Secondary | ICD-10-CM | POA: Diagnosis not present

## 2022-12-01 DIAGNOSIS — G629 Polyneuropathy, unspecified: Secondary | ICD-10-CM | POA: Diagnosis not present

## 2022-12-01 NOTE — Telephone Encounter (Signed)
Spoke with patient and she is requesting another injection. The injection lasted up until a couple weeks ago with relief more than 75%. She stated Dr. Alvester Morin told her she may need another injection before the 3 months. No new falls, accidents, or injuries. Please advise

## 2022-12-06 ENCOUNTER — Inpatient Hospital Stay: Payer: Medicare Other | Admitting: Physician Assistant

## 2022-12-11 ENCOUNTER — Other Ambulatory Visit: Payer: Self-pay

## 2022-12-11 ENCOUNTER — Ambulatory Visit: Payer: Medicare Other | Admitting: Physical Medicine and Rehabilitation

## 2022-12-11 VITALS — BP 148/75 | HR 83

## 2022-12-11 DIAGNOSIS — D3131 Benign neoplasm of right choroid: Secondary | ICD-10-CM | POA: Diagnosis not present

## 2022-12-11 DIAGNOSIS — Z961 Presence of intraocular lens: Secondary | ICD-10-CM | POA: Diagnosis not present

## 2022-12-11 DIAGNOSIS — M5416 Radiculopathy, lumbar region: Secondary | ICD-10-CM

## 2022-12-11 DIAGNOSIS — E119 Type 2 diabetes mellitus without complications: Secondary | ICD-10-CM | POA: Diagnosis not present

## 2022-12-11 DIAGNOSIS — H43813 Vitreous degeneration, bilateral: Secondary | ICD-10-CM | POA: Diagnosis not present

## 2022-12-11 DIAGNOSIS — H04123 Dry eye syndrome of bilateral lacrimal glands: Secondary | ICD-10-CM | POA: Diagnosis not present

## 2022-12-11 DIAGNOSIS — H401132 Primary open-angle glaucoma, bilateral, moderate stage: Secondary | ICD-10-CM | POA: Diagnosis not present

## 2022-12-11 MED ORDER — METHYLPREDNISOLONE ACETATE 40 MG/ML IJ SUSP
40.0000 mg | Freq: Once | INTRAMUSCULAR | Status: AC
Start: 2022-12-11 — End: 2022-12-11
  Administered 2022-12-11: 40 mg

## 2022-12-11 NOTE — Patient Instructions (Signed)

## 2022-12-11 NOTE — Progress Notes (Signed)
Functional Pain Scale - descriptive words and definitions  Unmanageable (7)  Pain interferes with normal ADL's/nothing seems to help/sleep is very difficult/active distractions are very difficult to concentrate on. Severe range order  Average Pain 5-6   +Driver, -BT, -Dye Allergies.   Right L5-S1 IL Does have intermittent stabbing pains in legs from her neuropathy --- makes her back hurt also.

## 2022-12-19 DIAGNOSIS — Z23 Encounter for immunization: Secondary | ICD-10-CM | POA: Diagnosis not present

## 2022-12-24 NOTE — Procedures (Signed)
Lumbar Epidural Steroid Injection - Interlaminar Approach with Fluoroscopic Guidance  Patient: Debra Harris      Date of Birth: [birth year] MRN: [id number] PCP: Carylon Perches, MD      Visit Date: 12/11/2022   Universal Protocol:     Consent Given By: the patient  Position: PRONE  Additional Comments: Vital signs were monitored before and after the procedure. Patient was prepped and draped in the usual sterile fashion. The correct patient, procedure, and site was verified.   Injection Procedure Details:   Procedure diagnoses: Lumbar radiculopathy [M54.16]   Meds Administered:  Meds ordered this encounter  Medications   methylPREDNISolone acetate (DEPO-MEDROL) injection 40 mg     Laterality: Right  Location/Site:  L5-S1  Needle: 3.5 in., 20 ga. Tuohy  Needle Placement: Paramedian epidural  Findings:   -Comments: Excellent flow of contrast into the epidural space.  Procedure Details: Using a paramedian approach from the side mentioned above, the region overlying the inferior lamina was localized under fluoroscopic visualization and the soft tissues overlying this structure were infiltrated with 4 ml. of 1% Lidocaine without Epinephrine. The Tuohy needle was inserted into the epidural space using a paramedian approach.   The epidural space was localized using loss of resistance along with counter oblique bi-planar fluoroscopic views.  After negative aspirate for air, blood, and CSF, a 2 ml. volume of Isovue-250 was injected into the epidural space and the flow of contrast was observed. Radiographs were obtained for documentation purposes.    The injectate was administered into the level noted above.   Additional Comments:  No complications occurred Dressing: 2 x 2 sterile gauze and Band-Aid    Post-procedure details: Patient was observed during the procedure. Post-procedure instructions were reviewed.  Patient left the clinic in stable condition.

## 2022-12-24 NOTE — Progress Notes (Signed)
CHARLOTT PFLANZ - 72 y.o. female MRN [id number]  Date of birth: 26-Oct-1950  Office Visit Note: Visit Date: 12/11/2022 PCP: Carylon Perches, MD Referred by: Carylon Perches, MD  Subjective: Chief Complaint  Patient presents with   Lower Back - Pain   HPI:  Debra Harris is a 72 y.o. female who comes in today for planned repeat Right L5-S1  Lumbar Interlaminar epidural steroid injection with fluoroscopic guidance.  The patient has failed conservative care including home exercise, medications, time and activity modification.  This injection will be diagnostic and hopefully therapeutic.  Please see requesting physician notes for further details and justification. Patient received more than 50% pain relief from prior injection.   Referring: Dr. Fuller Canada   ROS Otherwise per HPI.  Assessment & Plan: Visit Diagnoses:    ICD-10-CM   1. Lumbar radiculopathy  M54.16 XR C-ARM NO REPORT    Epidural Steroid injection    methylPREDNISolone acetate (DEPO-MEDROL) injection 40 mg      Plan: No additional findings.   Meds & Orders:  Meds ordered this encounter  Medications   methylPREDNISolone acetate (DEPO-MEDROL) injection 40 mg    Orders Placed This Encounter  Procedures   XR C-ARM NO REPORT   Epidural Steroid injection    Follow-up: Return for visit to requesting provider as needed.   Procedures: No procedures performed  Lumbar Epidural Steroid Injection - Interlaminar Approach with Fluoroscopic Guidance  Patient: Debra Harris      Date of Birth: 11-Apr-1950 MRN: [id number] PCP: Carylon Perches, MD      Visit Date: 12/11/2022   Universal Protocol:     Consent Given By: the patient  Position: PRONE  Additional Comments: Vital signs were monitored before and after the procedure. Patient was prepped and draped in the usual sterile fashion. The correct patient, procedure, and site was verified.   Injection Procedure Details:   Procedure diagnoses: Lumbar radiculopathy  [M54.16]   Meds Administered:  Meds ordered this encounter  Medications   methylPREDNISolone acetate (DEPO-MEDROL) injection 40 mg     Laterality: Right  Location/Site:  L5-S1  Needle: 3.5 in., 20 ga. Tuohy  Needle Placement: Paramedian epidural  Findings:   -Comments: Excellent flow of contrast into the epidural space.  Procedure Details: Using a paramedian approach from the side mentioned above, the region overlying the inferior lamina was localized under fluoroscopic visualization and the soft tissues overlying this structure were infiltrated with 4 ml. of 1% Lidocaine without Epinephrine. The Tuohy needle was inserted into the epidural space using a paramedian approach.   The epidural space was localized using loss of resistance along with counter oblique bi-planar fluoroscopic views.  After negative aspirate for air, blood, and CSF, a 2 ml. volume of Isovue-250 was injected into the epidural space and the flow of contrast was observed. Radiographs were obtained for documentation purposes.    The injectate was administered into the level noted above.   Additional Comments:  No complications occurred Dressing: 2 x 2 sterile gauze and Band-Aid    Post-procedure details: Patient was observed during the procedure. Post-procedure instructions were reviewed.  Patient left the clinic in stable condition.   Clinical History: MRI LUMBAR SPINE WITHOUT AND WITH CONTRAST   TECHNIQUE: Multiplanar and multiecho pulse sequences of the lumbar spine were obtained without and with intravenous contrast.   CONTRAST:  9mL GADAVIST GADOBUTROL 1 MMOL/ML IV SOLN   COMPARISON:  Lumbar spine radiographs 04/04/2021, MRI 10/11/2012   FINDINGS: Segmentation: Standard; the  lowest formed disc space is designated L5-S1.   Alignment:  Normal.   Vertebrae: Vertebral body heights are preserved. Background marrow signal is normal. There is small intraosseous hemangioma in the L3 vertebral  body. There is no suspicious marrow signal abnormality. There is no abnormal marrow edema or enhancement.   Conus medullaris and cauda equina: Conus extends to the L1 level. Conus and cauda equina appear normal.   Paraspinal and other soft tissues: Postsurgical edema and enhancement is noted in the soft tissues posterior to the L4-L5 level. There is mild perifacetal soft tissue edema and enhancement on the right at L4-L5.   Disc levels:   There is mild disc desiccation and narrowing at L3-L4 through L5-S1, slightly progressed since 2014   T12-L1: No significant spinal canal or neural foraminal stenosis   L1-L2: No significant spinal canal or neural foraminal stenosis   L2-L3: No significant spinal canal or neural foraminal stenosis.   L3-L4: There is a mild disc bulge with superimposed broad-based right subarticular zone protrusion and mild right worse than left facet arthropathy with ligamentum flavum thickening resulting in mild spinal canal stenosis and right subarticular zone effacement with likely impingement of the traversing right L4 nerve root (8-18), and no significant neural foraminal stenosis.   L4-L5: Status post left laminectomy. There is a disc bulge with a superimposed left foraminal/extraforaminal protrusion and moderate right and mild left facet arthropathy resulting in right subarticular zone narrowing which may impinge the traversing right L5 nerve root, mild-to-moderate left neural foraminal stenosis with possible contact of the exiting left L4 nerve root, and no significant right neural foraminal stenosis.   L5-S1: Moderate right worse than left facet arthropathy with a possible small osseous spur arising from the left lamina (8-26) common but no significant spinal canal or neural foraminal stenosis.   IMPRESSION: 1. Right subarticular zone disc protrusion at L3-L4 with likely impingement of the traversing right L4 nerve root. 2. Status post left  laminectomy at L4-L5. Right subarticular zone narrowing which may impinge the traversing right L5 nerve root, and mild-to-moderate left neural foraminal stenosis with possible contact of the exiting left L4 nerve root. Moderate right facet arthropathy at this level with surrounding perifacetal soft tissue edema and enhancement which could reflect a source of pain. 3. Moderate right worse than left facet arthropathy at L5-S1 without significant spinal canal or neural foraminal stenosis.     Electronically Signed   By: Lesia Hausen M.D.   On: 08/15/2022 11:15     Objective:  VS:  HT:    WT:   BMI:     BP:(!) 148/75  HR:83bpm  TEMP: ( )  RESP:  Physical Exam Vitals and nursing note reviewed.  Constitutional:      General: She is not in acute distress.    Appearance: Normal appearance. She is not ill-appearing.  HENT:     Head: Normocephalic and atraumatic.     Right Ear: External ear normal.     Left Ear: External ear normal.  Eyes:     Extraocular Movements: Extraocular movements intact.  Cardiovascular:     Rate and Rhythm: Normal rate.     Pulses: Normal pulses.  Pulmonary:     Effort: Pulmonary effort is normal. No respiratory distress.  Abdominal:     General: There is no distension.     Palpations: Abdomen is soft.  Musculoskeletal:        General: Tenderness present.     Cervical back: Neck supple.  Right lower leg: No edema.     Left lower leg: No edema.     Comments: Patient has good distal strength with no pain over the greater trochanters.  No clonus or focal weakness.  Skin:    Findings: No erythema, lesion or rash.  Neurological:     General: No focal deficit present.     Mental Status: She is alert and oriented to person, place, and time.     Sensory: No sensory deficit.     Motor: No weakness or abnormal muscle tone.     Coordination: Coordination normal.  Psychiatric:        Mood and Affect: Mood normal.        Behavior: Behavior normal.       Imaging: No results found.

## 2022-12-26 ENCOUNTER — Other Ambulatory Visit: Payer: Self-pay | Admitting: Orthopedic Surgery

## 2022-12-26 ENCOUNTER — Telehealth: Payer: Self-pay

## 2022-12-26 DIAGNOSIS — M5416 Radiculopathy, lumbar region: Secondary | ICD-10-CM

## 2022-12-26 MED ORDER — CYCLOBENZAPRINE HCL 5 MG PO TABS
5.0000 mg | ORAL_TABLET | Freq: Three times a day (TID) | ORAL | 5 refills | Status: DC | PRN
Start: 2022-12-26 — End: 2023-12-13

## 2022-12-26 NOTE — Telephone Encounter (Signed)
Cyclobenzaprine 5 MG  60 Tablets   Take 1 tablet (5 mg total) by mouth 3 (three) times daily as needed for muscle spasms.    PATIENT USES [facility] ON [address]

## 2022-12-26 NOTE — Progress Notes (Signed)
Meds ordered this encounter  Medications   cyclobenzaprine (FLEXERIL) 5 MG tablet    Sig: Take 1 tablet (5 mg total) by mouth 3 (three) times daily as needed for muscle spasms.    Dispense:  90 tablet    Refill:  5

## 2022-12-27 NOTE — Telephone Encounter (Signed)
Done yesterday.

## 2023-01-11 ENCOUNTER — Telehealth: Payer: Self-pay | Admitting: Primary Care

## 2023-01-11 NOTE — Telephone Encounter (Signed)
Patient would like order for travel CPAP machine. Patient looking at different companies. Patient phone number is [phone].

## 2023-01-12 ENCOUNTER — Other Ambulatory Visit: Payer: Self-pay | Admitting: Orthopedic Surgery

## 2023-01-12 DIAGNOSIS — G8929 Other chronic pain: Secondary | ICD-10-CM

## 2023-01-12 MED ORDER — HYDROCODONE-ACETAMINOPHEN 5-325 MG PO TABS
1.0000 | ORAL_TABLET | Freq: Four times a day (QID) | ORAL | 0 refills | Status: DC | PRN
Start: 2023-01-12 — End: 2023-02-27

## 2023-01-12 NOTE — Telephone Encounter (Signed)
Dr. Mort Sawyers pt - spoke w/the pt, she is having a lot of back pain and they are in [place].  She would like to see if Dr. Romeo Apple will call in Hydrocodone 5-325, 30 tablets, every 6 hours PRN for moderate pain (pain score 4-6) to the [facility] in [place].   [phone]

## 2023-01-17 ENCOUNTER — Inpatient Hospital Stay: Payer: Medicare Other | Attending: Physician Assistant

## 2023-01-17 DIAGNOSIS — E559 Vitamin D deficiency, unspecified: Secondary | ICD-10-CM | POA: Diagnosis not present

## 2023-01-17 DIAGNOSIS — D509 Iron deficiency anemia, unspecified: Secondary | ICD-10-CM | POA: Diagnosis not present

## 2023-01-17 DIAGNOSIS — D500 Iron deficiency anemia secondary to blood loss (chronic): Secondary | ICD-10-CM

## 2023-01-17 LAB — CBC WITH DIFFERENTIAL/PLATELET
Abs Immature Granulocytes: 0.01 10*3/uL (ref 0.00–0.07)
Basophils Absolute: 0.1 10*3/uL (ref 0.0–0.1)
Basophils Relative: 1 %
Eosinophils Absolute: 0.2 10*3/uL (ref 0.0–0.5)
Eosinophils Relative: 4 %
HCT: 41 % (ref 36.0–46.0)
Hemoglobin: 13 g/dL (ref 12.0–15.0)
Immature Granulocytes: 0 %
Lymphocytes Relative: 27 %
Lymphs Abs: 1.4 10*3/uL (ref 0.7–4.0)
MCH: 27.8 pg (ref 26.0–34.0)
MCHC: 31.7 g/dL (ref 30.0–36.0)
MCV: 87.6 fL (ref 80.0–100.0)
Monocytes Absolute: 0.6 10*3/uL (ref 0.1–1.0)
Monocytes Relative: 11 %
Neutro Abs: 2.9 10*3/uL (ref 1.7–7.7)
Neutrophils Relative %: 57 %
Platelets: 216 10*3/uL (ref 150–400)
RBC: 4.68 MIL/uL (ref 3.87–5.11)
RDW: 13.6 % (ref 11.5–15.5)
WBC: 5.1 10*3/uL (ref 4.0–10.5)
nRBC: 0 % (ref 0.0–0.2)

## 2023-01-17 LAB — VITAMIN D 25 HYDROXY (VIT D DEFICIENCY, FRACTURES): Vit D, 25-Hydroxy: 32.93 ng/mL (ref 30–100)

## 2023-01-17 LAB — FERRITIN: Ferritin: 59 ng/mL (ref 11–307)

## 2023-01-17 LAB — IRON AND TIBC
Iron: 94 ug/dL (ref 28–170)
Saturation Ratios: 24 % (ref 10.4–31.8)
TIBC: 393 ug/dL (ref 250–450)
UIBC: 299 ug/dL

## 2023-01-22 NOTE — Telephone Encounter (Signed)
Spoke with patient and she has already received travel CPAP. Nothing further needed at this time.

## 2023-01-23 NOTE — Progress Notes (Unsigned)
VIRTUAL VISIT via TELEPHONE NOTE [facility]   I connected with Debra Harris on 01/24/23 at  8:00 AM by telephone and verified that I am speaking with the correct person using two identifiers.  Location: Patient: Home Provider: [facility]   I discussed the limitations, risks, security and privacy concerns of performing an evaluation and management service by telephone and the availability of in person appointments. I also discussed with the patient that there may be a patient responsible charge related to this service. The patient expressed understanding and agreed to proceed.  REASON FOR VISIT: Iron deficiency anemia  CURRENT THERAPY: Intermittent IV iron  INTERVAL HISTORY:  [name] is contacted today for follow-up of her iron deficiency anemia.  She was last evaluated via telemedicine visit by Rojelio Brenner PA-C on 08/09/2022.  Most recent IV iron with Feraheme x 2 on 01/26/2022 and 02/02/2022.  At today's visit, she reports feeling fairly well.  She denies any abnormal fatigue.  She reports that her rectal bleeding occurs intermittently in "clusters," but that she can go several weeks in between episodes - last episode of significant bleeding was around March 2024.  She follows with GI and received hemorrhoid banding in April 2024, and has not had any bleeding since then.  She reports improvement in peripheral neuropathy while taking part in clinical trial.  She has some mild dyspnea on exertion.  No headaches, pica, restless legs, chest pain, lightheadedness, or syncope. She previously had issues with ferrous sulfate due to constipation, but has been trying ferrous bisglycinate about once a week since last visit and tolerating this well.  She is taking vitamin D 4000 units daily.  She has 75% energy and 100% appetite. She endorses that she is maintaining a stable weight.  REVIEW OF SYSTEMS:   Review of Systems  Constitutional:  Positive  for malaise/fatigue (mild, about 75%). Negative for chills, diaphoresis, fever and weight loss.  Respiratory:  Positive for shortness of breath (with exertion). Negative for cough.   Cardiovascular:  Positive for leg swelling. Negative for chest pain and palpitations.  Gastrointestinal:  Negative for abdominal pain, blood in stool, melena, nausea and vomiting.  Musculoskeletal:  Negative for joint pain and myalgias.  Neurological:  Positive for tingling. Negative for dizziness and headaches.  Psychiatric/Behavioral:  The patient does not have insomnia.      PHYSICAL EXAM: (per limitations of virtual telephone visit)  The patient is alert and oriented x 3, exhibiting adequate mentation, good mood, and ability to speak in full sentences and execute sound judgement.  ASSESSMENT & PLAN:  1.  Iron deficiency anemia: - Iron deficiency state from combination of malabsorption and chronic GI blood loss. - Colonoscopy on 09/13/2015 showing diverticuli in the sigmoid colon, descending colon and transverse colon, internal and external hemorrhoids banded.  - Last received IV iron with Feraheme x2 in November/December 2023 - She had prior issues with rectal bleeding, thought to be secondary to internal hemorrhoids.  She has not had any recurrent rectal bleeding since hemorrhoid banding in April 2024. - Symptomatic with fatigue and dyspnea on exertion - Patient qualified for IV iron at last visit with ferritin 36 (08/01/2022), but declined at that time in favor of trying ferrous bisglycinate, which she has been taking weekly and tolerated well. - Most recent labs (01/17/2023): Hgb 13.0/MCV 87.6 ferritin 59, iron saturation 24% with normalized TIBC 393 - PLAN: Since iron studies have improved on oral iron supplementation, recommend  increasing ferrous bisglycinate to every other day. - Patient instructed to call us if she feels that she needs IV iron before next visit. - Otherwise, we will repeat CBC with iron  panel and OFFICE visit in 6 months   2.  Vitamin D deficiency: - Vitamin D level was 19.96 (January 2022), and she has been taking vitamin D 2000 units daily since that time - increased to vitamin D 4000 units in November 2023 -- Most recent Vitamin D (01/17/2023): Normalized at 32.93 - PLAN: Continue vitamin D 4000 units daily.  Will recheck vitamin D annually (next due November 2025)   PLAN SUMMARY: >> Labs in 6 months = CBC/D, ferritin, iron/TIBC >> OFFICE visit in 6 months (after labs)  ** Last office visit in September 2023    I discussed the assessment and treatment plan with the patient. The patient was provided an opportunity to ask questions and all were answered. The patient agreed with the plan and demonstrated an understanding of the instructions.   The patient was advised to call back or seek an in-person evaluation if the symptoms worsen or if the condition fails to improve as anticipated.  I provided 22 minutes of non-face-to-face time during this encounter.  Carnella Guadalajara, PA-C 01/24/23 8:24 AM

## 2023-01-24 ENCOUNTER — Encounter: Payer: Self-pay | Admitting: Physician Assistant

## 2023-01-24 ENCOUNTER — Inpatient Hospital Stay: Payer: Medicare Other | Admitting: Physician Assistant

## 2023-01-24 DIAGNOSIS — D500 Iron deficiency anemia secondary to blood loss (chronic): Secondary | ICD-10-CM

## 2023-01-24 DIAGNOSIS — E559 Vitamin D deficiency, unspecified: Secondary | ICD-10-CM | POA: Diagnosis not present

## 2023-02-02 DIAGNOSIS — M5416 Radiculopathy, lumbar region: Secondary | ICD-10-CM | POA: Diagnosis not present

## 2023-02-08 ENCOUNTER — Other Ambulatory Visit: Payer: Self-pay

## 2023-02-08 ENCOUNTER — Ambulatory Visit (HOSPITAL_COMMUNITY): Payer: Medicare Other | Attending: Neurosurgery

## 2023-02-08 ENCOUNTER — Encounter (HOSPITAL_COMMUNITY): Payer: Self-pay

## 2023-02-08 DIAGNOSIS — M5416 Radiculopathy, lumbar region: Secondary | ICD-10-CM | POA: Diagnosis not present

## 2023-02-08 NOTE — Therapy (Signed)
OUTPATIENT PHYSICAL THERAPY THORACOLUMBAR EVALUATION   Patient Name: Debra Harris MRN: [id number] DOB:1950-12-16, 72 y.o., female Today's Date: 02/08/2023  END OF SESSION:  PT End of Session - 02/08/23 1035     Visit Number 1    Authorization Type Medicare A & B; [employer] (secondary)    Authorization Time Period no auth; no VL    PT Start Time 0805    PT Stop Time 0845    PT Time Calculation (min) 40 min    Activity Tolerance Patient tolerated treatment well    Behavior During Therapy WFL for tasks assessed/performed             Past Medical History:  Diagnosis Date   Adenomatous colon polyp    Allergic rhinitis    Arthritis    Back pain    Constipation    Diabetes mellitus    Diverticulosis    External hemorrhoids    Fibromyalgia    Gallbladder problem    GERD (gastroesophageal reflux disease)    Glaucoma    Glaucoma    Hemorrhoids    grade 3    Hip pain    IBS (irritable bowel syndrome)    IDA (iron deficiency anemia)    Iron deficiency anemia 06/05/2014   Irregular heartbeat    Leg edema    Leg numbness    Leg pain    Low tension glaucoma    Neuropathy, leg    Ovarian cyst 11/20/00   Prediabetes    Sleep apnea    CPAP   Sliding hiatal hernia    Past Surgical History:  Procedure Laterality Date   CARPAL TUNNEL RELEASE Bilateral    CATARACT EXTRACTION     bilateral   CHOLECYSTECTOMY     COLONOSCOPY  2005   Small external hemorrhoids, single sigmoid colon diverticulum, small polyp at the hepatic flexure focally adenomatous.   COLONOSCOPY  2010   Dr. Jena Gauss, Versed 4/Demerol 75. Pancolonic diverticula, external hemorrhoids. Colonoscopy every 5 years for history of adenomatous polyps   COLONOSCOPY  2014   Dr. Juanda Chance: Propofol. mild diverticulosis in the sigmoid colon, random colon biopsies benign.   COLONOSCOPY WITH PROPOFOL N/A 09/13/2015   Procedure: COLONOSCOPY WITH PROPOFOL;  Surgeon: Corbin Ade, MD;  [facility]: [facility];   Service: Endoscopy;  Laterality: N/A;  1030   ESOPHAGEAL DILATION N/A 10/26/2014   Procedure: ESOPHAGEAL DILATION 54 FRENCH;  Surgeon: Corbin Ade, MD;  Location: [facility];  Service: Endoscopy;  Laterality: N/A;   ESOPHAGOGASTRODUODENOSCOPY  2010   Dr. Jena Gauss, distal esophageal erosions, Elease Hashimoto dilator passed. Comment small hiatal hernia, patulous EG junction.   ESOPHAGOGASTRODUODENOSCOPY (EGD) WITH PROPOFOL N/A 10/26/2014   Dr.Rourk- distal esophageal erosions c/w mild erosive reflux esophagitis. hiatal hernia, o/w normal EGD   ESOPHAGOGASTRODUODENOSCOPY (EGD) WITH PROPOFOL N/A 09/13/2015   Procedure: ESOPHAGOGASTRODUODENOSCOPY (EGD) WITH PROPOFOL;  Surgeon: Corbin Ade, MD;  [facility]: [facility];  Service: Endoscopy;  Laterality: N/A;   GIVENS CAPSULE STUDY N/A 10/26/2014   couple of mucosal breaks/erosions but essentially unremarkable small bowel study   HEMORRHOID BANDING     KNEE SURGERY     right-arthroscopy   OOPHORECTOMY  age 1s   PARTIAL HYSTERECTOMY  age 5s   RECTAL EXAM UNDER ANESTHESIA N/A 10/26/2014   circumferential grade 3 hemoorrhoids easily reducible   TEE WITHOUT CARDIOVERSION N/A 04/27/2022   Procedure: TRANSESOPHAGEAL ECHOCARDIOGRAM (TEE);  Surgeon: Antoine Poche, MD;  Location: [facility];  Service: Endoscopy;  Laterality: N/A;  pt knows to arrive at 8:30   thumb surger Bilateral    trigger thumb   Patient Active Problem List   Diagnosis Date Noted   Diplopia 05/24/2021   Neuropathy 05/24/2021   Bilateral leg and foot pain 05/27/2020   Bilateral sciatica 05/27/2020   Arthralgia of toe 05/27/2020   Sprain of anterior talofibular ligament of right ankle 01/30/2020   Concussion with no loss of consciousness 03/07/2019   Retrograde memory loss 03/07/2019   Vertigo due to brain injury (HCC) 03/07/2019   Nystagmus on abduction 03/07/2019   New onset of headaches 03/07/2019   Controlled type 2 diabetes mellitus with complication, without long-term current use of  insulin (HCC) 03/07/2019   Class 2 severe obesity with serious comorbidity and body mass index (BMI) of 35.0 to 35.9 in adult (HCC) 03/07/2019   Anemia 02/25/2019   Hemorrhoids 02/25/2019   History of hysterectomy 02/25/2019   Irritable bowel syndrome 02/25/2019   Closed fracture of nasal bones 07/29/2018   Obstruction of nasal valve 07/29/2018   Obesity (BMI 30-39.9) 08/22/2017   Other fatigue 07/25/2017   Shortness of breath on exertion 07/25/2017   Prediabetes 07/25/2017   Vitamin D deficiency 07/25/2017   B12 nutritional deficiency 07/25/2017   Heart murmur 05/10/2016   Diverticulosis of colon without hemorrhage    IDA (iron deficiency anemia)    Rectal bleeding    History of melena    History of colonic polyps    Third degree hemorrhoids    Reflux esophagitis    Hiatal hernia    Dysphagia, pharyngoesophageal phase    Adenomatous colon polyp 10/16/2014   Melena 10/12/2014   Iron deficiency anemia 06/05/2014   Seasonal allergies 03/31/2014   Type 2 diabetes mellitus (HCC) 03/31/2014   Bilateral dry eyes 01/20/2014   Blepharochalasis 01/20/2014   H/O sleep apnea 01/20/2014   Pseudophakia of both eyes 01/20/2014   OSA (obstructive sleep apnea) 01/02/2011   HEMATOCHEZIA 12/28/2008   ABDOMINAL PAIN, LEFT LOWER QUADRANT 12/28/2008   History of colonic polyps 12/28/2008   GERD 12/25/2008   ARTHRITIS 12/25/2008   LOW BACK PAIN, ACUTE 12/25/2008   Dysphagia 12/25/2008   PUD, HX OF 12/25/2008    PCP: Carylon Perches, MD  REFERRING PROVIDER: Alda Ponder, MD   REFERRING DIAG: M54.16 (ICD-10-CM) - Radiculopathy, lumbar region   Rationale for Evaluation and Treatment: Rehabilitation  THERAPY DIAG:  Lumbar radiculopathy  ONSET DATE: 8 weeks ago  SUBJECTIVE:  SUBJECTIVE STATEMENT: Prone  to back weakness and overduing back aids. Pt reports in 2023 in [place], got on train, picked up travel and had pain and increasing onset of LLE weakness while traveling. Had emergency Neurosurgery for Laminectomy L4-L5.   Pt has a schedule 03/08/2022 for L4-L5 fusion. Severe onset of back pain 6 weeks that medications are not touching in regards to pain. Discussed with MD. Pt prior to surgery to increase strength and HEP. Pt reporting consistent self-HEP.   PERTINENT HISTORY:  4-5 hemilaminectomy and facetectomy on the left   PAIN:  Are you having pain? Yes: NPRS scale: best: 0; Worst: 4-5/10 Pain location: Low back Pain description: Ice pick Aggravating factors: heavy activities, ADLs, and heavy house work.  Relieving factors: Pain medication, heat, some self HEP  PRECAUTIONS: None  RED FLAGS: None   WEIGHT BEARING RESTRICTIONS: No  FALLS:  Has patient fallen in last 6 months? No  OCCUPATION: retired  PLOF: Independent  PATIENT GOALS: Prepare for and potentially avoid surgery  NEXT MD VISIT: 03/01/2023  OBJECTIVE:  Note: Objective measures were completed at Evaluation unless otherwise noted.  DIAGNOSTIC FINDINGS:  08/21/2022 MPRESSION: 1. Right subarticular zone disc protrusion at L3-L4 with likely impingement of the traversing right L4 nerve root. 2. Status post left laminectomy at L4-L5. Right subarticular zone narrowing which may impinge the traversing right L5 nerve root, and mild-to-moderate left neural foraminal stenosis with possible contact of the exiting left L4 nerve root. Moderate right facet arthropathy at this level with surrounding perifacetal soft tissue edema and enhancement which could reflect a source of pain. 3. Moderate right worse than left facet arthropathy at L5-S1 without significant spinal canal or neural foraminal stenosis.  02/02/23: MRI scan shows L4-5 spondylosis and severe right-sided neuroforaminal stenosis. She has evidence of a previous  L4-5 hemilaminectomy and facetectomy on the left.   PATIENT SURVEYS:  FOTO Not administered-one time visit  COGNITION: Overall cognitive status: Within functional limits for tasks assessed     SENSATION: WFL   POSTURE: decreased lumbar lordosis, increased thoracic kyphosis, and anterior pelvic tilt  PALPATION: Increased muscle tension through lumbar paraspinals  LUMBAR ROM:   AROM eval  Flexion 100%  Extension 100%  Right lateral flexion 75%  Left lateral flexion 75%  Right rotation   Left rotation    (Blank rows = not tested)  LOWER EXTREMITY ROM:     Active  Right eval Left eval  Hip flexion 3+ 3+  Hip extension 3+ 3+  Hip abduction 2+ 3+  Hip adduction    Hip internal rotation    Hip external rotation    Knee flexion    Knee extension    Ankle dorsiflexion    Ankle plantarflexion    Ankle inversion    Ankle eversion     (Blank rows = not tested)  LOWER EXTREMITY MMT:    MMT Right eval Left eval  Hip flexion    Hip extension    Hip abduction    Hip adduction    Hip internal rotation    Hip external rotation    Knee flexion    Knee extension    Ankle dorsiflexion    Ankle plantarflexion    Ankle inversion    Ankle eversion     (Blank rows = not tested)  LUMBAR SPECIAL TESTS:    FUNCTIONAL TESTS:  30 seconds chair stand test: 10x   GAIT: Distance walked: 51ft Assistive device utilized: None Level of assistance: Complete Independence Comments:  bilateral trendelenburg with anterior trunk lean.   TODAY'S TREATMENT:                                                                                                                              DATE: 02/08/2023 PT Evaluation    PATIENT EDUCATION:  Education details: PT Evaluation, findings, prognosis, frequency, attendance policy, and HEP if given.  Person educated: Patient Education method: Medical illustrator Education comprehension: verbalized understanding  HOME EXERCISE  PROGRAM: Access Code: [email or web address] URL: [email or web address] Date: 02/08/2023 Prepared by: Starling Manns  Exercises - Supine Posterior Pelvic Tilt  - 1 x daily - 7 x weekly - 3 sets - 10 reps - Bent Knee Fallouts  - 1 x daily - 7 x weekly - 3 sets - 10 reps - Supine Transversus Abdominis Bracing - Hands on Stomach  - 1 x daily - 7 x weekly - 3 sets - 10 reps - Beginner Knee Fold  - 1 x daily - 7 x weekly - 3 sets - 10 reps - Supine Bridge  - 1 x daily - 7 x weekly - 3 sets - 10 reps - Clamshell  - 1 x daily - 7 x weekly - 3 sets - 10 reps - Standing Hip Abduction with Counter Support  - 1 x daily - 7 x weekly - 3 sets - 10 reps - Standing Hip Extension with Counter Support  - 1 x daily - 7 x weekly - 3 sets - 10 reps  ASSESSMENT:  CLINICAL IMPRESSION: Patient is a 72 y.o. female who was seen today for physical therapy evaluation and treatment for M54.16 (ICD-10-CM) - Radiculopathy, lumbar region. Pt previous L4-5 laminectomy in 2023.  New onset of low back pain, with scheduled L4-L5 for posterior fusion and beginning of the new year.  Patient reporting very active lifestyle with consistent self HEP to promote flexibility and strengthening, feels that she has lost core strength and this is because of new onset of back pain.  Patient is highly motivated individual with desire to improve strength prior to surgery and potentially avoid surgery.  Patient was educated on short amount of time frame between now and scheduled surgery that would limit progress to potentially avoid surgery at patient's desire and report.  Discussed with patient benefit of prerehabilitation before surgery.  Given short amount of circumstance between now, and scheduled surgery, with limited time options due to holiday upcoming, patient in agreement with one-time visit and HEP instruction to utilize prior to surgery.  Low evaluation complexity was performed with self-management/home care provided for core and hip  strengthening.  Patient is not staying on skilled physical therapy caseload.  OBJECTIVE IMPAIRMENTS: Abnormal gait, difficulty walking, decreased ROM, decreased strength, and pain.   ACTIVITY LIMITATIONS: carrying, bending, standing, transfers, and locomotion level  PARTICIPATION LIMITATIONS: community activity and yard work  PERSONAL FACTORS: Age and 3+ comorbidities: DM2, Heart Murmur, OSA  are also affecting patient's functional outcome.  REHAB POTENTIAL: Good  CLINICAL DECISION MAKING: Stable/uncomplicated  EVALUATION COMPLEXITY: Low   GOALS: GOALS: Goals reviewed with patient? Yes  SHORT TERM GOALS: Target date: 02/08/2023  Pt will be independent with HEP in order to demonstrate participation in Physical Therapy POC.  Baseline: Goal status: MET  PLAN:  PT FREQUENCY: one time visit  PT DURATION:   PLANNED INTERVENTIONS: 97164- PT Re-evaluation, 97110-Therapeutic exercises, 97530- Therapeutic activity, [id number]- Neuromuscular re-education, 97535- Self Care, 19147- Manual therapy, and 97116- Gait training.  PLAN FOR NEXT SESSION: One time visit  Elie Goody, DPT [facility] [place] [phone] office   Nelida Meuse, PT 02/08/2023, 10:37 AM

## 2023-02-26 DIAGNOSIS — Z01818 Encounter for other preprocedural examination: Secondary | ICD-10-CM | POA: Diagnosis not present

## 2023-02-26 DIAGNOSIS — M5416 Radiculopathy, lumbar region: Secondary | ICD-10-CM | POA: Diagnosis not present

## 2023-02-27 ENCOUNTER — Telehealth: Payer: Self-pay | Admitting: Orthopedic Surgery

## 2023-02-27 ENCOUNTER — Other Ambulatory Visit: Payer: Self-pay | Admitting: Orthopedic Surgery

## 2023-02-27 DIAGNOSIS — G8929 Other chronic pain: Secondary | ICD-10-CM

## 2023-02-27 MED ORDER — HYDROCODONE-ACETAMINOPHEN 5-325 MG PO TABS
1.0000 | ORAL_TABLET | Freq: Four times a day (QID) | ORAL | 0 refills | Status: DC | PRN
Start: 2023-02-27 — End: 2023-04-12

## 2023-02-27 NOTE — Progress Notes (Signed)
 Meds ordered this encounter  Medications   HYDROcodone-acetaminophen (NORCO/VICODIN) 5-325 MG tablet    Sig: Take 1 tablet by mouth every 6 (six) hours as needed for moderate pain (pain score 4-6).    Dispense:  30 tablet    Refill:  0

## 2023-02-27 NOTE — Telephone Encounter (Signed)
 Dr. Areatha pt - spoke w/the pt, she is requesting a refill on Hydrocodone  5-325, 30 tablets, every 6 hours PRN for moderate pain (pain score 4-6).  They are still in [place], she would like it sent to [facility], [address] [place], [place] [phone].

## 2023-02-27 NOTE — Telephone Encounter (Signed)
 Filled

## 2023-02-27 NOTE — Telephone Encounter (Signed)
I called the patient and advised her to clear with her neurosurgeon first and if he says it's ok, to have them send [name] something stating that and we'll go from there.  She verbalized understanding.

## 2023-02-27 NOTE — Telephone Encounter (Signed)
 She is having surgery soon, please have her clear with neurosurgery if hydrocodone advisable before surgery

## 2023-02-27 NOTE — Telephone Encounter (Signed)
Patient advised.

## 2023-03-05 ENCOUNTER — Telehealth: Payer: Self-pay

## 2023-03-05 NOTE — Telephone Encounter (Signed)
   Pre-operative Risk Assessment    Patient Name: Debra Harris  DOB: 1950-09-23 MRN: [id number]   Date of last office visit: 11/08/22 Date of next office visit: none   Request for Surgical Clearance    Procedure:  Posterior lumbar 4-5 fusion   Date of Surgery:  Clearance 03/09/23                                 Surgeon:  Dr. Nelwyn Coyer Surgeon's Group or Practice Name:  [facility] location  Phone number:  [phone] Fax number:  [phone]   Type of Clearance Requested:   - Medical    Type of Anesthesia:  not listed    Additional requests/questions:    SignedConnye GORMAN Hoit   03/05/2023, 5:12 PM

## 2023-03-06 ENCOUNTER — Telehealth: Payer: Self-pay | Admitting: *Deleted

## 2023-03-06 DIAGNOSIS — M5416 Radiculopathy, lumbar region: Secondary | ICD-10-CM | POA: Diagnosis not present

## 2023-03-06 NOTE — Telephone Encounter (Signed)
 Pt scheduled for a tele visit, 03/07/23 9:20.  Consent on file / medications reconciled.     Patient Consent for Virtual Visit        Debra Harris has provided verbal consent on 03/06/2023 for a virtual visit (video or telephone).   CONSENT FOR VIRTUAL VISIT FOR:  Debra Harris Stable  By participating in this virtual visit I agree to the following:  I hereby voluntarily request, consent and authorize [facility] and its employed or contracted physicians, physician assistants, nurse practitioners or other licensed health care professionals (the Practitioner), to provide me with telemedicine health care services (the "Services) as deemed necessary by the treating Practitioner. I acknowledge and consent to receive the Services by the Practitioner via telemedicine. I understand that the telemedicine visit will involve communicating with the Practitioner through live audiovisual communication technology and the disclosure of certain medical information by electronic transmission. I acknowledge that I have been given the opportunity to request an in-person assessment or other available alternative prior to the telemedicine visit and am voluntarily participating in the telemedicine visit.  I understand that I have the right to withhold or withdraw my consent to the use of telemedicine in the course of my care at any time, without affecting my right to future care or treatment, and that the Practitioner or I may terminate the telemedicine visit at any time. I understand that I have the right to inspect all information obtained and/or recorded in the course of the telemedicine visit and may receive copies of available information for a reasonable fee.  I understand that some of the potential risks of receiving the Services via telemedicine include:  Delay or interruption in medical evaluation due to technological equipment failure or disruption; Information transmitted may not be sufficient (e.g.  poor resolution of images) to allow for appropriate medical decision making by the Practitioner; and/or  In rare instances, security protocols could fail, causing a breach of personal health information.  Furthermore, I acknowledge that it is my responsibility to provide information about my medical history, conditions and care that is complete and accurate to the best of my ability. I acknowledge that Practitioner's advice, recommendations, and/or decision may be based on factors not within their control, such as incomplete or inaccurate data provided by me or distortions of diagnostic images or specimens that may result from electronic transmissions. I understand that the practice of medicine is not an exact science and that Practitioner makes no warranties or guarantees regarding treatment outcomes. I acknowledge that a copy of this consent can be made available to me via my patient portal [facility] MyChart), or I can request a printed copy by calling the office of [facility].    I understand that my insurance will be billed for this visit.   I have read or had this consent read to me. I understand the contents of this consent, which adequately explains the benefits and risks of the Services being provided via telemedicine.  I have been provided ample opportunity to ask questions regarding this consent and the Services and have had my questions answered to my satisfaction. I give my informed consent for the services to be provided through the use of telemedicine in my medical care   [phone]

## 2023-03-06 NOTE — Telephone Encounter (Signed)
 Primary Cardiologist:Branch, Dorn, MD   Preoperative team, please contact this patient and set up a phone call appointment for further preoperative risk assessment. Please obtain consent and complete medication review. Thank you for your help.   I confirm that guidance regarding antiplatelet and oral anticoagulation therapy has been completed and, if necessary, noted below (none requested).   I also confirmed the patient resides in the state of [place] . As per [facility] Medical Board telemedicine laws, the patient must reside in the state in which the provider is licensed.   Debra EMERSON Bane, NP-C  03/06/2023, 8:07 AM [address], [address] Office [phone] Fax [phone]

## 2023-03-06 NOTE — Telephone Encounter (Signed)
 Call placed to pt, she was stunned about the phone call and the fact it only came up at [facility].  She said may be foreign or something.  I assured her I wasn't foreing.  Offered her a tele appt, 03/06/22 9:20, pt declined and stated  I am not sure how I feel about this, can I call you back?  I advised her she could call back, but not sure the same appt would be available.  She thanked me and hung up.

## 2023-03-06 NOTE — Telephone Encounter (Signed)
 Pt scheduled for a tele visit, 03/07/23 9:20.  Consent on file / medications reconciled.

## 2023-03-06 NOTE — Telephone Encounter (Signed)
 Pt is calling to make the pre-op tele visit

## 2023-03-06 NOTE — Progress Notes (Signed)
   Virtual Visit via Telephone Note   Contacted patient for telephone visit for preoperative cardiac risk assessment. She reports she is no longer having lumbar fusion on 03/09/2023, as she has achieved enough improvement through physical therapy to postpone the surgery.    Barnie Hila, NP  03/06/2023, 4:04 PM

## 2023-03-07 ENCOUNTER — Ambulatory Visit: Payer: Medicare Other | Attending: Cardiology | Admitting: Student

## 2023-03-07 DIAGNOSIS — Z01810 Encounter for preprocedural cardiovascular examination: Secondary | ICD-10-CM

## 2023-04-11 ENCOUNTER — Other Ambulatory Visit: Payer: Self-pay | Admitting: Orthopedic Surgery

## 2023-04-11 DIAGNOSIS — G8929 Other chronic pain: Secondary | ICD-10-CM

## 2023-04-11 NOTE — Telephone Encounter (Signed)
Dr. Mort Sawyers pt - spoke w/the pt, she is requesting a refill on Hydrocodone 5-325, 30 tablets, every 6 hours PRN for moderate pain (pain score 4-6) to be sent to [facility] on [address]  She would like it called in now and again in March before she leaves to go on her trip.  She doesn't want to be stuck and in pain on her trip.  She did state that she did not have her surgery.

## 2023-04-12 MED ORDER — HYDROCODONE-ACETAMINOPHEN 5-325 MG PO TABS
1.0000 | ORAL_TABLET | Freq: Four times a day (QID) | ORAL | 0 refills | Status: DC | PRN
Start: 2023-04-12 — End: 2023-05-03

## 2023-04-16 DIAGNOSIS — H401132 Primary open-angle glaucoma, bilateral, moderate stage: Secondary | ICD-10-CM | POA: Diagnosis not present

## 2023-04-16 DIAGNOSIS — Z961 Presence of intraocular lens: Secondary | ICD-10-CM | POA: Diagnosis not present

## 2023-04-16 DIAGNOSIS — H43813 Vitreous degeneration, bilateral: Secondary | ICD-10-CM | POA: Diagnosis not present

## 2023-04-16 DIAGNOSIS — E119 Type 2 diabetes mellitus without complications: Secondary | ICD-10-CM | POA: Diagnosis not present

## 2023-04-16 DIAGNOSIS — D3131 Benign neoplasm of right choroid: Secondary | ICD-10-CM | POA: Diagnosis not present

## 2023-04-16 DIAGNOSIS — H04123 Dry eye syndrome of bilateral lacrimal glands: Secondary | ICD-10-CM | POA: Diagnosis not present

## 2023-04-18 ENCOUNTER — Other Ambulatory Visit (INDEPENDENT_AMBULATORY_CARE_PROVIDER_SITE_OTHER): Payer: Self-pay

## 2023-04-18 ENCOUNTER — Ambulatory Visit: Payer: Medicare Other | Admitting: Orthopedic Surgery

## 2023-04-18 VITALS — BP 146/85 | HR 64

## 2023-04-18 DIAGNOSIS — M25571 Pain in right ankle and joints of right foot: Secondary | ICD-10-CM

## 2023-04-18 DIAGNOSIS — M7731 Calcaneal spur, right foot: Secondary | ICD-10-CM

## 2023-04-18 DIAGNOSIS — M7661 Achilles tendinitis, right leg: Secondary | ICD-10-CM | POA: Diagnosis not present

## 2023-04-18 MED ORDER — PREDNISONE 10 MG (48) PO TBPK
ORAL_TABLET | Freq: Every day | ORAL | 1 refills | Status: DC
Start: 2023-04-18 — End: 2023-05-08

## 2023-04-18 NOTE — Progress Notes (Signed)
Chief Complaint  Patient presents with   Ankle Pain    Right ankle pain, no injury   73 year old female was walking in a grocery store started having pain in the posterior aspect and posterior lateral aspect of her right heel presents for evaluation and management  History of tendon release as a child for plantarflexed foot  Examination reveals a healthy well-developed well-nourished female awake alert and oriented x 3 mood and affect normal  Patient ambulatory with no assistive device  Exam of the right ankle and foot shows that the Achilles tendon is intact both with palpation and with Janee Morn test.  She is tender over the insertion site of the Achilles and this is noted also a little laterally not much medially.  Ankle range of motion remains normal ankle is stable  Imaging was obtained  Plantar spurs noted Achilles tendon spurs noted perhaps calcification just above the spur is also noted  Recommend prednisone Dosepak.  Will add a cam walker if needed.  Ice every night for 30 minutes  Meds ordered this encounter  Medications   predniSONE (STERAPRED UNI-PAK 48 TAB) 10 MG (48) TBPK tablet    Sig: Take by mouth daily. 10 mg ds 12 days    Dispense:  48 tablet    Refill:  1

## 2023-04-18 NOTE — Progress Notes (Signed)
.  newpatien

## 2023-04-21 DIAGNOSIS — R7989 Other specified abnormal findings of blood chemistry: Secondary | ICD-10-CM | POA: Diagnosis not present

## 2023-04-21 DIAGNOSIS — R778 Other specified abnormalities of plasma proteins: Secondary | ICD-10-CM | POA: Diagnosis not present

## 2023-04-21 DIAGNOSIS — R0789 Other chest pain: Secondary | ICD-10-CM | POA: Diagnosis not present

## 2023-04-21 DIAGNOSIS — E876 Hypokalemia: Secondary | ICD-10-CM | POA: Diagnosis present

## 2023-04-21 DIAGNOSIS — R079 Chest pain, unspecified: Secondary | ICD-10-CM | POA: Diagnosis not present

## 2023-04-21 DIAGNOSIS — G4733 Obstructive sleep apnea (adult) (pediatric): Secondary | ICD-10-CM | POA: Diagnosis present

## 2023-04-21 DIAGNOSIS — I2510 Atherosclerotic heart disease of native coronary artery without angina pectoris: Secondary | ICD-10-CM | POA: Diagnosis present

## 2023-04-21 DIAGNOSIS — Z7901 Long term (current) use of anticoagulants: Secondary | ICD-10-CM | POA: Diagnosis not present

## 2023-04-21 DIAGNOSIS — I361 Nonrheumatic tricuspid (valve) insufficiency: Secondary | ICD-10-CM | POA: Diagnosis not present

## 2023-04-21 DIAGNOSIS — I4891 Unspecified atrial fibrillation: Secondary | ICD-10-CM | POA: Diagnosis present

## 2023-04-21 DIAGNOSIS — E119 Type 2 diabetes mellitus without complications: Secondary | ICD-10-CM | POA: Diagnosis present

## 2023-04-21 DIAGNOSIS — K219 Gastro-esophageal reflux disease without esophagitis: Secondary | ICD-10-CM | POA: Diagnosis present

## 2023-04-21 DIAGNOSIS — Z9989 Dependence on other enabling machines and devices: Secondary | ICD-10-CM | POA: Diagnosis not present

## 2023-04-21 DIAGNOSIS — I10 Essential (primary) hypertension: Secondary | ICD-10-CM | POA: Diagnosis present

## 2023-04-22 DIAGNOSIS — Z7901 Long term (current) use of anticoagulants: Secondary | ICD-10-CM | POA: Diagnosis not present

## 2023-04-22 DIAGNOSIS — I2510 Atherosclerotic heart disease of native coronary artery without angina pectoris: Secondary | ICD-10-CM | POA: Diagnosis present

## 2023-04-22 DIAGNOSIS — I10 Essential (primary) hypertension: Secondary | ICD-10-CM | POA: Diagnosis present

## 2023-04-22 DIAGNOSIS — Z9989 Dependence on other enabling machines and devices: Secondary | ICD-10-CM | POA: Diagnosis not present

## 2023-04-22 DIAGNOSIS — I361 Nonrheumatic tricuspid (valve) insufficiency: Secondary | ICD-10-CM | POA: Diagnosis not present

## 2023-04-22 DIAGNOSIS — K219 Gastro-esophageal reflux disease without esophagitis: Secondary | ICD-10-CM | POA: Diagnosis present

## 2023-04-22 DIAGNOSIS — G4733 Obstructive sleep apnea (adult) (pediatric): Secondary | ICD-10-CM | POA: Diagnosis present

## 2023-04-22 DIAGNOSIS — E119 Type 2 diabetes mellitus without complications: Secondary | ICD-10-CM | POA: Diagnosis present

## 2023-04-22 DIAGNOSIS — I4891 Unspecified atrial fibrillation: Secondary | ICD-10-CM | POA: Diagnosis present

## 2023-04-22 DIAGNOSIS — E876 Hypokalemia: Secondary | ICD-10-CM | POA: Diagnosis present

## 2023-04-22 DIAGNOSIS — R778 Other specified abnormalities of plasma proteins: Secondary | ICD-10-CM | POA: Diagnosis not present

## 2023-04-22 DIAGNOSIS — R7989 Other specified abnormal findings of blood chemistry: Secondary | ICD-10-CM | POA: Diagnosis not present

## 2023-04-24 ENCOUNTER — Telehealth: Payer: Self-pay

## 2023-04-24 NOTE — Transitions of Care (Post Inpatient/ED Visit) (Signed)
   04/24/2023  Name: Debra Harris MRN: [id number] DOB: 1950-08-18  Today's TOC FU Call Status: Today's TOC FU Call Status:: Unsuccessful Call (1st Attempt) Unsuccessful Call (1st Attempt) Date: 04/24/23  Attempted to reach the patient regarding the most recent Inpatient/ED visit. Patient was called in an Outreach attempt to offer VBCI  30-day TOC program. Pt is eligible for program due to potential risk for readmission and/or high utilization. Unfortunately, I was not able to speak with the patient in regards to recent hospital discharge    Patient's voicemail has  a generic greeting. To maintain HIPAA compliance, left message with VBCI CM contact information only  and a request for a call back .   Follow Up Plan: Additional outreach attempts will be made to reach the patient to complete the Transitions of Care (Post Inpatient/ED visit) call.   Susa Loffler , BSN, RN [facility]   VBCI-Population Health RN Care Manager Direct Dial [phone]  Fax: [phone] Website: [email or web address]

## 2023-04-24 NOTE — Transitions of Care (Post Inpatient/ED Visit) (Addendum)
 04/24/2023  Name: Debra Harris MRN: [id number] DOB: 1950-05-13  Today's TOC FU Call Status: Today's TOC FU Call Status:: Successful TOC FU Call Completed Unsuccessful Call (1st Attempt) Date: 04/24/23 [facility] FU Call Complete Date: 04/24/23 Patient's Name and Date of Birth confirmed.  Transition Care Management Follow-up Telephone Call Date of Discharge: 04/23/23 Discharge Facility: Other [facility]) Name of Other (Non-[facility]) Discharge Facility: [facility] Primary Inpatient Discharge Diagnosis:: Atrial fibrillation with rapid ventricular response How have you been since you were released from the hospital?: Better Any questions or concerns?: No  Items Reviewed: Did you receive and understand the discharge instructions provided?: Yes Medications obtained,verified, and reconciled?: Yes (Medications Reviewed) (Medication reconciliation completed based on recent discharge summary Patient taking medications as instructed and is aware of any changes or dosage adjustments medication regimen. Patient denies questions and reports no barriers to medication adherence) Any new allergies since your discharge?: No Dietary orders reviewed?: Yes Type of Diet Ordered:: Regular Heart Healthy Do you have support at home?: Yes People in Home: spouse, child(ren), adult Name of Support/Comfort Primary Source: Husband [place], [place] children in medical feld  Medications Reviewed Today: Medications Reviewed Today     Reviewed by Johnnette Barrios, RN (Registered Nurse) on 04/24/23 at 1547  Med List Status: <None>   Medication Order Taking? Sig Documenting Provider Last Dose Status Informant  cetirizine (ZYRTEC) 10 MG chewable tablet [id number] Yes Chew 10 mg by mouth daily. Take 1 tablet (10 mg total) by mouth daily as needed for allergies. [provider] Taking Active   cyclobenzaprine (FLEXERIL) 5 MG tablet [id number] Yes Take 1 tablet (5 mg total) by mouth 3 (three) times daily as needed  for muscle spasms. Vickki Hearing, MD Taking Active   diphenhydramine-acetaminophen (TYLENOL PM EXTRA STRENGTH) 25-500 MG TABS tablet [id number] No Take 1 tablet by mouth at bedtime.  Patient not taking: Reported on 04/24/2023   [provider] Not Taking Active Self  docusate sodium (COLACE) 100 MG capsule [id number] Yes Take 100 mg by mouth 2 (two) times daily. Take 2 capsules (200 mg total) by mouth nightly as needed for constipation. [provider] Taking Active   HYDROcodone-acetaminophen (NORCO/VICODIN) 5-325 MG tablet [id number] Yes Take 1 tablet by mouth every 6 (six) hours as needed for moderate pain (pain score 4-6). Vickki Hearing, MD Taking Active   LUMIGAN 0.01 % SOLN [id number] Yes Place 1 drop into both eyes at bedtime. [provider] Taking Active Self  MAGNESIUM PO [id number] Yes Take 1 tablet by mouth at bedtime. [provider] Taking Active Self           Med Note (Kashana Breach L   Tue Apr 24, 2023  3:42 PM) Take 1 tablet (400 mg total) by mouth nightly.  metoprolol succinate (TOPROL-XL) 25 MG 24 hr tablet [id number] Yes Take 25 mg by mouth daily. Take 1 tablet (25 mg total) by mouth daily. [provider] Taking Active   Multiple Vitamins-Minerals (ZINC PO) [id number] Yes Take 1 tablet by mouth in the morning. [provider] Taking Active Self  naproxen sodium (ALEVE) 220 MG tablet [id number] Yes Take 220 mg by mouth daily as needed (pain.). [provider] Taking Active Self  NON FORMULARY [id number] Yes CPAP at night  [provider] Taking Active Self  predniSONE (STERAPRED UNI-PAK 48 TAB) 10 MG (48) TBPK tablet [id number] Yes Take by mouth daily. 10 mg ds 12 days Vickki Hearing, MD Taking Active  Med Note (Marlet Korte L   Tue Apr 24, 2023  3:47 PM) Take 1 tablet (10 mg total) by mouth As Directed. As directed on package. Patient started on 04/20/2023  Wheat Dextrin [facility]) POWD  [id number] Yes Take 1 Dose by mouth daily. [provider] Taking Active Self           Med Note Sharon Seller, Jojo Geving L   Tue Apr 24, 2023  3:46 PM) 1 tab daily           Medication reconciliation / review completed based on most recent discharge summary and EHR medication list. Confirmed patient is taking all newly prescribed medications as instructed (any discrepancies are noted in review section)   Patient / Caregiver is aware of any changes to and / or  any dosage adjustments to medication regimen. Patient/ Caregiver denies questions at this time and reports no barriers to medication adherence.   Reviewed and added  aspirin 81 MG tablet Take 1 tablet (81 mg total) by mouth daily.  bimatoprost 0.01 % Drop Administer 1 drop into both eyes daily. Commonly known as: LUMIGAN  cetirizine 10 MG tablet Take 1 tablet (10 mg total) by mouth daily as needed for allergies. Commonly known as: ZyrTEC  cyclobenzaprine 5 MG tablet Take 1 tablet (5 mg total) by mouth nightly as needed for muscle spasms. Commonly known as: FLEXERIL  docusate sodium 100 MG capsule Take 2 capsules (200 mg total) by mouth nightly as needed for constipation. Commonly known as: COLACE  magnesium oxide 400 mg (241.3 mg magnesium) tablet Take 1 tablet (400 mg total) by mouth nightly. Commonly known as: MAG-OX  metoprolol succinate 25 MG 24 hr tablet Take 1 tablet (25 mg total) by mouth daily. Commonly known as: TOPROL-XL Start taking on: April 24, 2023  predniSONE 10 mg tablet pack Take 1 tablet (10 mg total) by mouth As Directed. As directed on package. Patient started on 04/20/2023 Commonly known as: STERAPRED DS  Recc  recommend Eliquis 5mg  po bid as OP.   Home Care and Equipment/Supplies: Were Home Health Services Ordered?: No Any new equipment or medical supplies ordered?: No  Functional Questionnaire: Do you need assistance with bathing/showering or dressing?: No Do you need assistance with  meal preparation?: No Do you need assistance with eating?: No Do you have difficulty maintaining continence: No Do you need assistance with getting out of bed/getting out of a chair/moving?: No Do you have difficulty managing or taking your medications?: No  Follow up appointments reviewed: PCP Follow-up appointment confirmed?: Yes Follow-up Provider: Carylon Perches, MD she is unsure of date but stated it is within 2 weeks Specialist Hospital Follow-up appointment confirmed?: Yes Date of Specialist follow-up appointment?: 04/27/23 Follow-Up Specialty Provider:: Cardiologist , Hematology 04/2023 Do you need transportation to your follow-up appointment?: No Do you understand care options if your condition(s) worsen?: Yes-patient verbalized understanding  SDOH Interventions Today    Flowsheet Row Most Recent Value  SDOH Interventions   Food Insecurity Interventions Intervention Not Indicated  Housing Interventions Intervention Not Indicated  Transportation Interventions Intervention Not Indicated, Patient Resources (Friends/Family)  Utilities Interventions Intervention Not Indicated      Interventions Today    Flowsheet Row Most Recent Value  Chronic Disease   Chronic disease during today's visit Atrial Fibrillation (AFib)  General Interventions   General Interventions Discussed/Reviewed General Interventions Discussed, General Interventions Reviewed, Doctor Visits  Doctor Visits Discussed/Reviewed PCP, Specialist, Doctor Visits Reviewed, Doctor Visits Discussed  PCP/Specialist Visits Compliance with follow-up visit  Exercise Interventions   Exercise Discussed/Reviewed Physical Activity  Mental Health Interventions   Mental Health Discussed/Reviewed Mental Health Reviewed  Nutrition Interventions   Nutrition Discussed/Reviewed Nutrition Discussed, Nutrition Reviewed, Decreasing salt  Pharmacy Interventions   Pharmacy Dicussed/Reviewed Medications and their functions, Pharmacy Topics  Reviewed  Safety Interventions   Safety Discussed/Reviewed Safety Reviewed       Benefits reviewed  Based on current information and Insurance plan -Reviewed benefits accessible to patient, including details about eligibility options for care and  available value based care options  if any areas of needs were identified.  Reviewed patient/  caregiver's ability to access and / or  ability with navigating the benefits system..Amb Referral made if indicted , refer to orders section of note for details   Reviewed goals for care Patient/ Caregiver  verbalizes understanding of instructions and care plan provided. Patient / Caregiver was encouraged to make informed decisions about their care, actively participate in managing their health condition, and implement lifestyle changes as needed to promote independence and self-management of health care. There were no reported  barriers to care.   TOC program  Patient is at high risk for readmission and/or has history of  high utilization  Discussed VBCI  TOC program and weekly calls to patient to assess condition/status, medication management  and provide support/education as indicated . Patient/ Caregiver voiced understanding and declined enrollment in the 30-day TOC Program.    She is doing fine at home. Her spouse is a Nutritional therapist and she stated she has a family of healthcare workers  She has her medications She is hesitant to take Eliquis and will discuss with Cardiology. She has not started this as of yet. After our call She called back and  Requested Eliquis be ordered and sent to [facility] in [place] [phone]) Call placed to Hospital service ( [phone]) for  Jaymes Graff, MD (Attending) NPI: [id number] [phone] (Work)  and Left message on answering regarding patient request with pharmacy and patient information along with this Nurse contact info   The patient has been provided with contact information for the care  management team and has been advised to call with any health-related questions or concerns. Follow up as indicated with Care Team , or sooner should any new problems arise.    Susa Loffler , BSN, RN [facility]   VBCI-Population Health RN Care Manager Direct Dial [phone]  Fax: [phone] Website: [email or web address]

## 2023-04-27 DIAGNOSIS — I4891 Unspecified atrial fibrillation: Secondary | ICD-10-CM | POA: Diagnosis not present

## 2023-05-01 DIAGNOSIS — I480 Paroxysmal atrial fibrillation: Secondary | ICD-10-CM | POA: Diagnosis not present

## 2023-05-02 DIAGNOSIS — E1140 Type 2 diabetes mellitus with diabetic neuropathy, unspecified: Secondary | ICD-10-CM | POA: Diagnosis not present

## 2023-05-03 ENCOUNTER — Other Ambulatory Visit: Payer: Self-pay | Admitting: Orthopedic Surgery

## 2023-05-03 ENCOUNTER — Telehealth: Payer: Self-pay

## 2023-05-03 DIAGNOSIS — G8929 Other chronic pain: Secondary | ICD-10-CM

## 2023-05-03 MED ORDER — HYDROCODONE-ACETAMINOPHEN 5-325 MG PO TABS
1.0000 | ORAL_TABLET | Freq: Four times a day (QID) | ORAL | 0 refills | Status: AC | PRN
Start: 2023-05-03 — End: ?

## 2023-05-03 NOTE — Telephone Encounter (Signed)
 Hydrocodone-Acetaminophen 5/325 MG  Qty 30 Tablets  Take 1 tablet by mouth every 6 (six) hours as needed for moderate pain (pain score 4-6).   PATIENT USES [facility] ON [address]

## 2023-05-04 DIAGNOSIS — K648 Other hemorrhoids: Secondary | ICD-10-CM | POA: Diagnosis not present

## 2023-05-07 DIAGNOSIS — E1140 Type 2 diabetes mellitus with diabetic neuropathy, unspecified: Secondary | ICD-10-CM | POA: Diagnosis not present

## 2023-05-07 DIAGNOSIS — I480 Paroxysmal atrial fibrillation: Secondary | ICD-10-CM | POA: Diagnosis not present

## 2023-05-08 ENCOUNTER — Ambulatory Visit (INDEPENDENT_AMBULATORY_CARE_PROVIDER_SITE_OTHER): Admitting: Orthopedic Surgery

## 2023-05-08 DIAGNOSIS — M7661 Achilles tendinitis, right leg: Secondary | ICD-10-CM | POA: Diagnosis not present

## 2023-05-08 MED ORDER — PREDNISONE 10 MG PO TABS: ORAL_TABLET | ORAL | 0 refills | Status: DC

## 2023-05-08 NOTE — Patient Instructions (Signed)
Achilles Tendinitis Rehab  Ask your health care provider which exercises are safe for you. Do exercises exactly as told by your health care provider and adjust them as directed. It is normal to feel mild stretching, pulling, tightness, or discomfort as you do these exercises. Stop right away if you feel sudden pain or your pain gets worse. Do not begin these exercises until told by your health care provider.  Stretching and range-of-motion exercises These exercises warm up your muscles and joints and improve the movement and flexibility of your ankle. These exercises also help to relieve pain.  Standing wall calf stretch with straight knee    Stand with your hands against a wall. Extend your left / right leg behind you, and bend your front knee slightly. Keep both of your heels on the floor. Point the toes of your back foot slightly inward. Keeping your heels on the floor and your back knee straight, shift your weight toward the wall. Do not allow your back to arch. You should feel a gentle stretch in your upper calf. Hold this position for 10 seconds. Repeat 10 times. Complete this exercise 3-4 times per week.  Standing wall calf stretch with bent knee Stand with your hands against a wall. Extend your left / right leg behind you, and bend your front knee slightly. Keep both of your heels on the floor. Point the toes of your back foot slightly inward. Keeping your heels on the floor, bend your back knee slightly. You should feel a gentle stretch deep in your lower calf near your heel. Hold this position for 10 seconds. Repeat 10 times. Complete this exercise 3-4 times per week.  Strengthening exercises These exercises build strength and control of your ankle. Endurance is the ability to use your muscles for a long time, even after they get tired.  Plantar flexion with band    In this exercise, you push your toes downward, away from you, with an exercise band providing  resistance. Sit on the floor with your left / right leg extended. You may put a pillow under your calf to give your foot more room to move. Loop a rubber exercise band or tube around the ball of your left / right foot. The ball of your foot is on the walking surface, right under your toes. The band or tube should be slightly tense when your foot is relaxed. If the band or tube slips, you can put on your shoe or put a washcloth between the band and your foot to help it stay in place. Slowly point your toes downward, pushing them away from you (plantar flexion). Hold this position for 10 seconds. Slowly release the tension in the band or tube, controlling smoothly until your foot is back to the starting position. Repeat steps 1-5 with your left / right leg. Repeat 10 times. Complete this exercise 3-4 times per week.   Eccentric heel drop    In this exercise, you stand and slowly raise your heel and then slowly lower it. This exercise lengthens the calf muscles (eccentric) while the heel bears weight. If this exercise is too easy, try doing it while wearing a backpack with weights in it. Stand on a step with the balls of your feet. The ball of your foot is on the walking surface, right under your toes. Do not put your heels on the step. For balance, rest your hands on the wall or on a railing. Rise up onto the balls of your feet.  Keeping your heels up, shift all of your weight to your left / right leg and pick up your other leg. Slowly lower your left / right leg so your heel drops below the level of the step. Put down your other foot before returning to the start position. If told by your health care provider, build up to: 3 sets of 15 repetitions while keeping your knees straight. 3 sets of 15 repetitions while keeping your knees slightly bent as far as told by your health care provider. Repeat 10 times. Complete this exercise 3-4 times per week.   Balance exercises These exercises  improve or maintain your balance. Balance is important in preventing falls.   Single leg stand If this exercise is too easy, you can try it with your eyes closed or while standing on a pillow. Without shoes, stand near a railing or in a door frame. Hold on to the railing or door frame as needed. Stand on your left / right foot. Keep your big toe down on the floor and try to keep your arch lifted. Hold this position for 10 seconds. Repeat 10 times. Complete this exercise 3-4 times per week.   This information is not intended to replace advice given to you by your health care provider. Make sure you discuss any questions you have with your health care provider.

## 2023-05-08 NOTE — Progress Notes (Unsigned)
 Orthopaedic Clinic Return  Assessment: Debra Harris is a 73 y.o. female with the following: Right Achilles tendinitis   Plan: Mrs. Debra Harris continues to have pain in the right heel, consistent with Achilles tendinitis.  There is a large bone spur emanating from the calcaneus, at the site of the Achilles tendon insertion.  She is able to achieve dorsiflexion of approximately 15 degrees with her knee bent.  As a result, I do not think that tightness of the heel cord is contributing to her current symptoms.  Unfortunately, prednisone has not provided sustained relief.  She has tried patches in the past, but these do not stay in place.  We also discussed that he will pad and/or a heel lift in her shoes.  She states that she is unable to tolerate a walking boot.  She does have a trip planned for [place] next week.  She is interested in having some prednisone available, should her symptoms continue to bother her.  This was provided for her today.  I also provided her with some simple exercises to work on.  Continue with ice or heat, depending on which makes it feel better.  Meds ordered this encounter  Medications   predniSONE (DELTASONE) 10 MG tablet    Sig: Follow dose pack instructions    Dispense:  42 tablet    Refill:  0     Follow-up: Return if symptoms worsen or fail to improve.   Subjective:  Chief Complaint  Patient presents with   Foot Pain    R heel pain no better after dose pack. Swelling is getting better, pt has been icing and using massage boot. Feels like some pulled her achilles. 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 female who returns to clinic for repeat evaluation of right heel pain.  She was evaluated by Dr. Romeo Apple a few weeks ago.  At that time, he prescribed a prednisone Dosepak.  She has completed this medication, albeit inconsistent.   The dosing was interrupted by a hospital admission.  She did briefly try a walking boot, but this put pressure on her heel.  She is currently ambulating with a cane.  She does have a history of Achilles tendon lengthening as an infant.  Review of Systems: No fevers or chills No numbness or tingling No chest pain No shortness of breath No bowel or bladder dysfunction No GI distress No headaches   Objective: There were no vitals taken for this visit.  Physical Exam:  Evaluation of the right ankle demonstrates no deformity.  No swelling.  No redness.  She does have tenderness to palpation over the posterior aspect of the calcaneus.  The Achilles tendon is intact.  She has pain with dorsiflexion beyond 10 degrees.  Toes warm and well-perfused.  No tenderness to palpation to plantar heel.  She is ambulating in soft shoe, with a cane.  IMAGING: I personally ordered and reviewed the following images:  X-rays of the right foot were previously obtained.  Prominent bone spur at the insertion of the Achilles.   Oliver Barre, MD 05/08/2023 11:34 AM

## 2023-05-09 ENCOUNTER — Emergency Department (HOSPITAL_COMMUNITY)

## 2023-05-09 ENCOUNTER — Encounter: Payer: Self-pay | Admitting: Orthopedic Surgery

## 2023-05-09 ENCOUNTER — Encounter (HOSPITAL_COMMUNITY): Payer: Self-pay | Admitting: Emergency Medicine

## 2023-05-09 ENCOUNTER — Emergency Department (HOSPITAL_COMMUNITY)
Admission: EM | Admit: 2023-05-09 | Discharge: 2023-05-09 | Disposition: A | Discharge status: Home / Self Care | Attending: Emergency Medicine | Admitting: Emergency Medicine

## 2023-05-09 ENCOUNTER — Other Ambulatory Visit: Payer: Self-pay

## 2023-05-09 DIAGNOSIS — R079 Chest pain, unspecified: Secondary | ICD-10-CM | POA: Diagnosis not present

## 2023-05-09 DIAGNOSIS — R0602 Shortness of breath: Secondary | ICD-10-CM | POA: Diagnosis present

## 2023-05-09 DIAGNOSIS — I480 Paroxysmal atrial fibrillation: Secondary | ICD-10-CM | POA: Insufficient documentation

## 2023-05-09 DIAGNOSIS — E119 Type 2 diabetes mellitus without complications: Secondary | ICD-10-CM | POA: Insufficient documentation

## 2023-05-09 LAB — CBC
HCT: 39.4 % (ref 36.0–46.0)
Hemoglobin: 12.9 g/dL (ref 12.0–15.0)
MCH: 28.5 pg (ref 26.0–34.0)
MCHC: 32.7 g/dL (ref 30.0–36.0)
MCV: 87 fL (ref 80.0–100.0)
Platelets: 184 10*3/uL (ref 150–400)
RBC: 4.53 MIL/uL (ref 3.87–5.11)
RDW: 13.9 % (ref 11.5–15.5)
WBC: 6.6 10*3/uL (ref 4.0–10.5)
nRBC: 0 % (ref 0.0–0.2)

## 2023-05-09 LAB — BASIC METABOLIC PANEL
Anion gap: 8 (ref 5–15)
BUN: 18 mg/dL (ref 8–23)
CO2: 25 mmol/L (ref 22–32)
Calcium: 8.9 mg/dL (ref 8.9–10.3)
Chloride: 105 mmol/L (ref 98–111)
Creatinine, Ser: 0.82 mg/dL (ref 0.44–1.00)
GFR, Estimated: >60 mL/min (ref >60)
Glucose, Bld: 147 mg/dL — ABNORMAL HIGH (ref 70–99)
Potassium: 4 mmol/L (ref 3.5–5.1)
Sodium: 138 mmol/L (ref 135–145)

## 2023-05-09 LAB — TROPONIN I (HIGH SENSITIVITY): Troponin I (High Sensitivity): 3 ng/L (ref <18)

## 2023-05-09 MED ORDER — PREDNISONE 10 MG PO TABS: 10.0000 mg | ORAL_TABLET | Freq: Every day | ORAL | 0 refills | Status: DC

## 2023-05-09 MED ORDER — MIDAZOLAM HCL 2 MG/2ML IJ SOLN
2.0000 mg | Freq: Once | INTRAMUSCULAR | Status: DC
  Filled 2023-05-09: qty 2

## 2023-05-09 MED ORDER — FENTANYL CITRATE (PF) 100 MCG/2ML IJ SOLN
100.0000 ug | Freq: Once | INTRAMUSCULAR | Status: DC
  Filled 2023-05-09: qty 2

## 2023-05-09 MED ORDER — ETOMIDATE 2 MG/ML IV SOLN
20.0000 mg | Freq: Once | INTRAVENOUS | Status: DC
  Filled 2023-05-09: qty 10

## 2023-05-09 NOTE — Addendum Note (Signed)
 Addended by: Thane Edu A on: 05/09/2023 11:58 AM   Modules accepted: Orders

## 2023-05-09 NOTE — ED Provider Notes (Signed)
 [place] EMERGENCY DEPARTMENT AT [facility] Provider Note   CSN: [id number] Arrival date & time: 05/09/23  1641     History  Chief Complaint  Patient presents with   Chest Pain   Shortness of Breath    Debra Harris is a 73 y.o. female with a history of atrial fibrillation and type II diabetes mellitus presents to the ED today for palpitations.  Patient states that she was diagnosed with atrial fibrillation about 2 weeks ago and since then she has had 2 episodes of palpitations, chest pain, shortness of breath.  She checks her heart rate on her watch when she had that she is in A-fib with a highest heart rate in the 150s.  States that she started feeling the palpitations, shortness of breath, and chest pain last night.  She took 2 extra doses of Metoprolol, as instructed by her cardiologist, without improvement of symptoms.  They advised that she come here for further evaluation.  She takes Eliquis twice a day as well, last dose was this morning.  No additional complaints or concerns at this time.    Home Medications Prior to Admission medications   Medication Sig Start Date End Date Taking? Authorizing Provider  cetirizine (ZYRTEC) 10 MG chewable tablet Chew 10 mg by mouth daily. Take 1 tablet (10 mg total) by mouth daily as needed for allergies.    [provider]  cyclobenzaprine (FLEXERIL) 5 MG tablet Take 1 tablet (5 mg total) by mouth 3 (three) times daily as needed for muscle spasms. 12/26/22   Vickki Hearing, MD  docusate sodium (COLACE) 100 MG capsule Take 100 mg by mouth 2 (two) times daily. Take 2 capsules (200 mg total) by mouth nightly as needed for constipation.    [provider]  HYDROcodone-acetaminophen (NORCO/VICODIN) 5-325 MG tablet Take 1 tablet by mouth every 6 (six) hours as needed for moderate pain (pain score 4-6). 05/03/23   Vickki Hearing, MD  LUMIGAN 0.01 % SOLN Place 1 drop into both eyes at bedtime. 12/09/19   [provider]  MAGNESIUM PO Take 1 tablet by mouth at bedtime.    [provider]  metoprolol succinate (TOPROL-XL) 25 MG 24 hr tablet Take 25 mg by mouth daily. Take 1 tablet (25 mg total) by mouth daily.    [provider]  Multiple Vitamins-Minerals (ZINC PO) Take 1 tablet by mouth in the morning.    [provider]  naproxen sodium (ALEVE) 220 MG tablet Take 220 mg by mouth daily as needed (pain.).    [provider]  NON FORMULARY CPAP at night     [provider]  predniSONE (DELTASONE) 10 MG tablet Take 1 tablet (10 mg total) by mouth daily. Additional instructions provided to the patient 05/09/23   Oliver Barre, MD  Wheat Dextrin [facility]) POWD Take 1 Dose by mouth daily.    [provider]      Allergies    Injectafer [ferric carboxymaltose] and Tizanidine    Review of Systems   Review of Systems  Cardiovascular:  Positive for palpitations.  All other systems reviewed and are negative.   Physical Exam Updated Vital Signs BP 132/67   Pulse 70   Temp 98.9 F (37.2 C) (Oral)   Resp 14   Ht 5\' 4"  (1.626 m)   Wt 94.1 kg   SpO2 99%   BMI 35.60 kg/m  Physical Exam Vitals and nursing note reviewed.  Constitutional:  General: She is not in acute distress.    Appearance: Normal appearance.  HENT:     Head: Normocephalic and atraumatic.     Mouth/Throat:     Mouth: Mucous membranes are moist.  Eyes:     Conjunctiva/sclera: Conjunctivae normal.     Pupils: Pupils are equal, round, and reactive to light.  Cardiovascular:     Rate and Rhythm: Tachycardia present. Rhythm irregular.     Pulses: Normal pulses.     Heart sounds: Normal heart sounds.  Pulmonary:     Effort: Pulmonary effort is normal.     Breath sounds: Normal breath sounds.  Abdominal:     Palpations: Abdomen is soft.     Tenderness: There is no abdominal tenderness.  Musculoskeletal:        General: Normal range of motion.     Cervical back:  Normal range of motion.  Skin:    General: Skin is warm and dry.     Findings: No rash.  Neurological:     General: No focal deficit present.     Mental Status: She is alert.  Psychiatric:        Mood and Affect: Mood normal.        Behavior: Behavior normal.    ED Results / Procedures / Treatments   Labs (all labs ordered are listed, but only abnormal results are displayed) Labs Reviewed  BASIC METABOLIC PANEL - Abnormal; Notable for the following components:      Result Value   Glucose, Bld 147 (*)    All other components within normal limits  CBC  TROPONIN I (HIGH SENSITIVITY)    EKG EKG Interpretation Date/Time:  Wednesday May 09 2023 19:43:46 EDT Ventricular Rate:  71 PR Interval:  157 QRS Duration:  100 QT Interval:  373 QTC Calculation: 406 R Axis:   13  Text Interpretation: Sinus rhythm Abnormal R-wave progression, early transition Confirmed by Eber Hong ([id number]) on 05/09/2023 7:45:21 PM  Radiology DG Chest 2 View Result Date: 05/09/2023 CLINICAL DATA:  Chest pain. EXAM: CHEST - 2 VIEW COMPARISON:  December 20, 2021. FINDINGS: The heart size and mediastinal contours are within normal limits. Both lungs are clear. The visualized skeletal structures are unremarkable. IMPRESSION: No active cardiopulmonary disease. Electronically Signed   By: Lupita Raider M.D.   On: 05/09/2023 19:57    Procedures Procedures: not indicated.   Medications Ordered in ED Medications - No data to display  ED Course/ Medical Decision Making/ A&P                                 Medical Decision Making Amount and/or Complexity of Data Reviewed Labs: ordered. Radiology: ordered.  Risk Prescription drug management.   This patient presents to the ED for concern of atrial fibrillation, this involves an extensive number of treatment options, and is a complaint that carries with it a high risk of complications and morbidity.   Differential diagnosis includes: paroxsymal vs  permanent afib, etc.   Comorbidities  See HPI above   Additional History  Additional history obtained from prior records.   Cardiac Monitoring / EKG  The patient was maintained on a cardiac monitor.  I personally viewed and interpreted the cardiac monitored which showed: afib with RVR with a heart rate of 110 bpm. Repeat EKG 2 hours later shows normal sinus rhythm with a rate of 74 bpm.   Lab Tests  I  ordered and personally interpreted labs.  The pertinent results include:   Troponin BMP and CBC within normal limits no acute electrolyte abnormality, AKI, infection, or anemia   Imaging Studies  I ordered imaging studies including chest x-ray I independently visualized and interpreted imaging which showed:  No acute cardiopulmonary processes I agree with the radiologist interpretation   Problem List / ED Course / Critical Interventions / Medication Management  Patient reports that she was diagnosed with atrial fibrillation 2 weeks ago at her cardiologist in [facility].  She started on Eliquis and metoprolol.  She states that last week she spontaneously went into A-fib and took an extra metoprolol, per her cardiologist recommendation and ultimately returned to normal sinus rhythm.  She states the same thing happened last night and she took 2 additional tablets of metoprolol without improvement.  She is here today with chest pain, shortness of breath, and palpitations.  Heart rate ranging from 90 bpm to 140 bpm on my evaluation. Patient staffed with my attending, Dr. Hyacinth Meeker.  Medications ordered with intentions to cardiovert, but patient returned to normal sinus rhythm on her own with a heart rate in the 70s. I discussed labs with patient and her husband at bedside.  Since she is in normal rhythm and her labs are unremarkable, patient is safe for discharge home.  Advise close follow-up with her cardiologist.   Social Determinants of Health  Access to  healthcare   Test / Admission - Considered  Patient is hemodynamically stable and safe discharge home. Return precautions provided.       Final Clinical Impression(s) / ED Diagnoses Final diagnoses:  Paroxysmal atrial fibrillation [facility])    Rx / DC Orders ED Discharge Orders     None         Maxwell Marion, PA-C 05/09/23 2032    Eber Hong, MD 05/10/23 1451

## 2023-05-09 NOTE — Discharge Instructions (Signed)
 As discussed, follow-up with your cardiologist to let them know that you are here today and that you spontaneously return to normal rhythm.  Keep taking your Eliquis and Metoprolol as prescribed.  Return to the ED if: You have pain in your chest. You have trouble breathing. You have side effects of blood thinners, such as blood in your vomit, poop (stool), or pee (urine), or bleeding that cannot stop. You have any signs of a stroke. "BE FAST" is an easy way to remember the main warning signs: B - Balance. Dizziness, sudden trouble walking, or loss of balance. E - Eyes. Trouble seeing or a change in how you see. F - Face. Sudden weakness or loss of feeling in the face. The face or eyelid may droop on one side. A - Arms.Weakness or loss of feeling in an arm. This happens suddenly and usually on one side of the body. S - Speech. Sudden trouble speaking, slurred speech, or trouble understanding what people say. T - Time.Time to call emergency services. Write down what time symptoms started. You have other signs of a stroke, such as: A sudden, very bad headache with no known cause. Feeling like you may vomit (nausea). Vomiting. A seizure.

## 2023-05-09 NOTE — ED Notes (Signed)
 EDP and RT at bedside and pt converted to NSR. EKG performed.

## 2023-05-09 NOTE — ED Triage Notes (Signed)
 Pt was recently diagnosed with afib. Pt was experiencing palpitations and chest pain today. PT called cardiology and was advised to come to the ED for evaluation.

## 2023-05-11 DIAGNOSIS — E669 Obesity, unspecified: Secondary | ICD-10-CM | POA: Diagnosis not present

## 2023-05-11 DIAGNOSIS — M1990 Unspecified osteoarthritis, unspecified site: Secondary | ICD-10-CM | POA: Diagnosis not present

## 2023-05-11 DIAGNOSIS — R079 Chest pain, unspecified: Secondary | ICD-10-CM | POA: Diagnosis not present

## 2023-05-11 DIAGNOSIS — I4891 Unspecified atrial fibrillation: Secondary | ICD-10-CM | POA: Diagnosis not present

## 2023-05-11 DIAGNOSIS — Z888 Allergy status to other drugs, medicaments and biological substances status: Secondary | ICD-10-CM | POA: Diagnosis not present

## 2023-05-11 DIAGNOSIS — K219 Gastro-esophageal reflux disease without esophagitis: Secondary | ICD-10-CM | POA: Diagnosis not present

## 2023-05-11 DIAGNOSIS — Z7901 Long term (current) use of anticoagulants: Secondary | ICD-10-CM | POA: Diagnosis not present

## 2023-05-11 DIAGNOSIS — K625 Hemorrhage of anus and rectum: Secondary | ICD-10-CM | POA: Diagnosis not present

## 2023-05-11 DIAGNOSIS — I10 Essential (primary) hypertension: Secondary | ICD-10-CM | POA: Diagnosis not present

## 2023-05-11 DIAGNOSIS — I480 Paroxysmal atrial fibrillation: Secondary | ICD-10-CM | POA: Diagnosis not present

## 2023-05-11 DIAGNOSIS — Z6834 Body mass index (BMI) 34.0-34.9, adult: Secondary | ICD-10-CM | POA: Diagnosis not present

## 2023-05-11 DIAGNOSIS — Z7984 Long term (current) use of oral hypoglycemic drugs: Secondary | ICD-10-CM | POA: Diagnosis not present

## 2023-05-11 DIAGNOSIS — I2510 Atherosclerotic heart disease of native coronary artery without angina pectoris: Secondary | ICD-10-CM | POA: Diagnosis not present

## 2023-05-11 DIAGNOSIS — Z79899 Other long term (current) drug therapy: Secondary | ICD-10-CM | POA: Diagnosis not present

## 2023-05-11 DIAGNOSIS — K648 Other hemorrhoids: Secondary | ICD-10-CM | POA: Diagnosis not present

## 2023-05-11 DIAGNOSIS — G4730 Sleep apnea, unspecified: Secondary | ICD-10-CM | POA: Diagnosis not present

## 2023-05-11 DIAGNOSIS — Z1152 Encounter for screening for COVID-19: Secondary | ICD-10-CM | POA: Diagnosis not present

## 2023-05-12 DIAGNOSIS — I4891 Unspecified atrial fibrillation: Secondary | ICD-10-CM | POA: Diagnosis not present

## 2023-05-20 DIAGNOSIS — I10 Essential (primary) hypertension: Secondary | ICD-10-CM | POA: Diagnosis not present

## 2023-05-20 DIAGNOSIS — Z7189 Other specified counseling: Secondary | ICD-10-CM | POA: Diagnosis not present

## 2023-05-20 DIAGNOSIS — I4891 Unspecified atrial fibrillation: Secondary | ICD-10-CM | POA: Diagnosis not present

## 2023-05-20 DIAGNOSIS — G4730 Sleep apnea, unspecified: Secondary | ICD-10-CM | POA: Diagnosis not present

## 2023-05-20 DIAGNOSIS — I482 Chronic atrial fibrillation, unspecified: Secondary | ICD-10-CM | POA: Diagnosis not present

## 2023-05-20 DIAGNOSIS — I480 Paroxysmal atrial fibrillation: Secondary | ICD-10-CM | POA: Diagnosis not present

## 2023-05-20 DIAGNOSIS — E669 Obesity, unspecified: Secondary | ICD-10-CM | POA: Diagnosis not present

## 2023-05-20 DIAGNOSIS — I2510 Atherosclerotic heart disease of native coronary artery without angina pectoris: Secondary | ICD-10-CM | POA: Diagnosis not present

## 2023-05-20 DIAGNOSIS — Z79899 Other long term (current) drug therapy: Secondary | ICD-10-CM | POA: Diagnosis not present

## 2023-05-20 DIAGNOSIS — Z7901 Long term (current) use of anticoagulants: Secondary | ICD-10-CM | POA: Diagnosis not present

## 2023-05-20 DIAGNOSIS — R0602 Shortness of breath: Secondary | ICD-10-CM | POA: Diagnosis not present

## 2023-05-20 DIAGNOSIS — Z8719 Personal history of other diseases of the digestive system: Secondary | ICD-10-CM | POA: Diagnosis not present

## 2023-05-21 DIAGNOSIS — I480 Paroxysmal atrial fibrillation: Secondary | ICD-10-CM | POA: Diagnosis not present

## 2023-05-29 DIAGNOSIS — Z9889 Other specified postprocedural states: Secondary | ICD-10-CM | POA: Diagnosis not present

## 2023-05-29 DIAGNOSIS — Z006 Encounter for examination for normal comparison and control in clinical research program: Secondary | ICD-10-CM | POA: Diagnosis not present

## 2023-05-29 DIAGNOSIS — I480 Paroxysmal atrial fibrillation: Secondary | ICD-10-CM | POA: Diagnosis present

## 2023-05-29 DIAGNOSIS — Z79899 Other long term (current) drug therapy: Secondary | ICD-10-CM | POA: Diagnosis not present

## 2023-05-29 DIAGNOSIS — I10 Essential (primary) hypertension: Secondary | ICD-10-CM | POA: Diagnosis present

## 2023-05-29 DIAGNOSIS — Z01810 Encounter for preprocedural cardiovascular examination: Secondary | ICD-10-CM | POA: Diagnosis not present

## 2023-05-29 DIAGNOSIS — E119 Type 2 diabetes mellitus without complications: Secondary | ICD-10-CM | POA: Diagnosis present

## 2023-05-29 DIAGNOSIS — G4733 Obstructive sleep apnea (adult) (pediatric): Secondary | ICD-10-CM | POA: Diagnosis present

## 2023-05-29 DIAGNOSIS — Z7901 Long term (current) use of anticoagulants: Secondary | ICD-10-CM | POA: Diagnosis not present

## 2023-05-29 DIAGNOSIS — I2510 Atherosclerotic heart disease of native coronary artery without angina pectoris: Secondary | ICD-10-CM | POA: Diagnosis present

## 2023-05-29 DIAGNOSIS — Z888 Allergy status to other drugs, medicaments and biological substances status: Secondary | ICD-10-CM | POA: Diagnosis not present

## 2023-05-30 DIAGNOSIS — E119 Type 2 diabetes mellitus without complications: Secondary | ICD-10-CM | POA: Diagnosis present

## 2023-05-30 DIAGNOSIS — Z888 Allergy status to other drugs, medicaments and biological substances status: Secondary | ICD-10-CM | POA: Diagnosis not present

## 2023-05-30 DIAGNOSIS — I480 Paroxysmal atrial fibrillation: Secondary | ICD-10-CM | POA: Diagnosis present

## 2023-05-30 DIAGNOSIS — Z9889 Other specified postprocedural states: Secondary | ICD-10-CM | POA: Diagnosis not present

## 2023-05-30 DIAGNOSIS — I10 Essential (primary) hypertension: Secondary | ICD-10-CM | POA: Diagnosis present

## 2023-05-30 DIAGNOSIS — Z79899 Other long term (current) drug therapy: Secondary | ICD-10-CM | POA: Diagnosis not present

## 2023-05-30 DIAGNOSIS — I2510 Atherosclerotic heart disease of native coronary artery without angina pectoris: Secondary | ICD-10-CM | POA: Diagnosis present

## 2023-05-30 DIAGNOSIS — Z006 Encounter for examination for normal comparison and control in clinical research program: Secondary | ICD-10-CM | POA: Diagnosis not present

## 2023-05-30 DIAGNOSIS — Z7901 Long term (current) use of anticoagulants: Secondary | ICD-10-CM | POA: Diagnosis not present

## 2023-05-30 DIAGNOSIS — G4733 Obstructive sleep apnea (adult) (pediatric): Secondary | ICD-10-CM | POA: Diagnosis present

## 2023-05-30 HISTORY — PX: LEFT ATRIAL APPENDAGE OCCLUSION: SHX173A

## 2023-06-01 NOTE — Transitions of Care (Post Inpatient/ED Visit) (Signed)
 06/01/2023  Name: Debra Harris MRN: [id number] DOB: [birth year]  Today's TOC FU Call Status:   Patient's Name and Date of Birth confirmed.  Transition Care Management Follow-up Telephone Call  Medication reconciliation / review completed in EPIC was based on most recent discharge summary / Provider visit updates and EHR medication list and Patient reporting . Confirmed patient is taking all newly prescribed medications as instructed (any discrepancies are noted in review section)   Patient is aware of any changes to and / or  any dosage adjustments to medication regimen. Patient denies questions at this time and reports no barriers to medication adherence.   Medications Reviewed Today: Medications Reviewed Today     Reviewed by Johnnette Barrios, RN (Registered Nurse) on 06/01/23 at 1228  Med List Status: <None>   Medication Order Taking? Sig Documenting Provider Last Dose Status Informant  cetirizine (ZYRTEC) 10 MG chewable tablet [id number] Yes Chew 10 mg by mouth daily. Take 1 tablet (10 mg total) by mouth daily as needed for allergies. [provider] Taking Active   cyclobenzaprine (FLEXERIL) 5 MG tablet [id number] Yes Take 1 tablet (5 mg total) by mouth 3 (three) times daily as needed for muscle spasms. Vickki Hearing, MD Taking Active   docusate sodium (COLACE) 100 MG capsule [id number] Yes Take 100 mg by mouth 2 (two) times daily. Take 2 capsules (200 mg total) by mouth nightly as needed for constipation. [provider] Taking Active   HYDROcodone-acetaminophen (NORCO/VICODIN) 5-325 MG tablet [id number] Yes Take 1 tablet by mouth every 6 (six) hours as needed for moderate pain (pain score 4-6). Vickki Hearing, MD Taking Active   LUMIGAN 0.01 % SOLN [id number] Yes Place 1 drop into both eyes at bedtime. [provider] Taking Active Self  MAGNESIUM PO [id number] Yes Take 1 tablet by mouth at bedtime. [provider] Taking Active Self            Med Note (Jaja Switalski L   Tue Apr 24, 2023  3:42 PM) Take 1 tablet (400 mg total) by mouth nightly.  metoprolol succinate (TOPROL-XL) 25 MG 24 hr tablet [id number] Yes Take 25 mg by mouth daily. Take 1 tablet (25 mg total) by mouth daily. [provider] Taking Active   Multiple Vitamins-Minerals (ZINC PO) [id number] Yes Take 1 tablet by mouth in the morning. [provider] Taking Active Self  naproxen sodium (ALEVE) 220 MG tablet [id number] No Take 220 mg by mouth daily as needed (pain.).  Patient not taking: Reported on 06/01/2023   [provider] Not Taking Active Self  NON FORMULARY [id number] Yes CPAP at night  [provider] Taking Active Self  predniSONE (DELTASONE) 10 MG tablet [id number] No Take 1 tablet (10 mg total) by mouth daily. Additional instructions provided to the patient  Patient not taking: Reported on 06/01/2023   Oliver Barre, MD Not Taking Active   Wheat Dextrin [facility]) POWD [id number] Yes Take 1 Dose by mouth daily. [provider] Taking Active Self           Med Note (Tami Barren L   Tue Apr 24, 2023  3:46 PM) 1 tab daily             Updates reviewed with patient  START TAKING  furosemide (LASIX) 40 MG tablet Take 1 tablet (40 mg total) by mouth daily as needed (for swelling, shortness of breath, and quick weight gain >3lbs overnight).     [employer] For  additional details of TOC call  Please see full Transition of Care ( TOC) visit note and assessment documented in  [employer]    Susa Loffler , BSN, RN [facility]   VBCI-Population Health RN Care Manager Direct Dial [phone]  Fax: [phone] Website: [email or web address]

## 2023-06-05 DIAGNOSIS — M19071 Primary osteoarthritis, right ankle and foot: Secondary | ICD-10-CM | POA: Diagnosis not present

## 2023-06-05 DIAGNOSIS — M7661 Achilles tendinitis, right leg: Secondary | ICD-10-CM | POA: Diagnosis not present

## 2023-06-05 DIAGNOSIS — E118 Type 2 diabetes mellitus with unspecified complications: Secondary | ICD-10-CM | POA: Diagnosis not present

## 2023-06-05 DIAGNOSIS — Z6835 Body mass index (BMI) 35.0-35.9, adult: Secondary | ICD-10-CM | POA: Diagnosis not present

## 2023-06-05 DIAGNOSIS — E66812 Obesity, class 2: Secondary | ICD-10-CM | POA: Diagnosis not present

## 2023-06-05 DIAGNOSIS — R600 Localized edema: Secondary | ICD-10-CM | POA: Diagnosis not present

## 2023-06-05 DIAGNOSIS — M7731 Calcaneal spur, right foot: Secondary | ICD-10-CM | POA: Diagnosis not present

## 2023-06-05 DIAGNOSIS — M25571 Pain in right ankle and joints of right foot: Secondary | ICD-10-CM | POA: Diagnosis not present

## 2023-06-11 NOTE — Progress Notes (Unsigned)
 GI Office Note    Referring Provider: Carylon Perches, MD Primary Care Physician:  Carylon Perches, MD  Primary Gastroenterologist: Roetta Sessions, MD   Chief Complaint   No chief complaint on file.    History of Present Illness   Debra Harris is a 73 y.o. female presenting today for GERD. Also with history of constipation.   DM, Afib recent diagnosis 04/2023 Hemorrhoid banding 06/2022  05/2023:***  Colonoscopy 08/2015: -diverticulosis -non-bleeding prolapsed external and internal hmoerrhoids -colonoscopy in 10 years  EGD 08/2015: -normal esophagus s/p dilation -small hh    Medications   Current Outpatient Medications  Medication Sig Dispense Refill   cetirizine (ZYRTEC) 10 MG chewable tablet Chew 10 mg by mouth daily. Take 1 tablet (10 mg total) by mouth daily as needed for allergies.     cyclobenzaprine (FLEXERIL) 5 MG tablet Take 1 tablet (5 mg total) by mouth 3 (three) times daily as needed for muscle spasms. 90 tablet 5   docusate sodium (COLACE) 100 MG capsule Take 100 mg by mouth 2 (two) times daily. Take 2 capsules (200 mg total) by mouth nightly as needed for constipation.     furosemide (LASIX) 40 MG tablet Take 40 mg by mouth as needed for edema. by mouth daily as needed (for swelling, shortness of breath, and quick weight gain >3lbs overnight).     HYDROcodone-acetaminophen (NORCO/VICODIN) 5-325 MG tablet Take 1 tablet by mouth every 6 (six) hours as needed for moderate pain (pain score 4-6). (Patient not taking: Reported on 06/01/2023) 30 tablet 0   LUMIGAN 0.01 % SOLN Place 1 drop into both eyes at bedtime.     MAGNESIUM PO Take 1 tablet by mouth at bedtime.     metoprolol succinate (TOPROL-XL) 25 MG 24 hr tablet Take 25 mg by mouth daily. Take 1 tablet (25 mg total) by mouth daily.     Multiple Vitamins-Minerals (ZINC PO) Take 1 tablet by mouth in the morning.     naproxen sodium (ALEVE) 220 MG tablet Take 220 mg by mouth daily as needed (pain.). (Patient not  taking: Reported on 06/01/2023)     NON FORMULARY CPAP at night      predniSONE (DELTASONE) 10 MG tablet Take 1 tablet (10 mg total) by mouth daily. Additional instructions provided to the patient (Patient not taking: Reported on 06/01/2023) 45 tablet 0   Wheat Dextrin (BENEFIBER) POWD Take 1 Dose by mouth daily.     No current facility-administered medications for this visit.    Allergies   Allergies as of 06/12/2023 - Review Complete 06/01/2023  Allergen Reaction Noted   Injectafer [ferric carboxymaltose] Other (See Comments) 06/14/2016   Tizanidine Swelling 02/09/2022    Past Medical History   Past Medical History:  Diagnosis Date   Adenomatous colon polyp    Allergic rhinitis    Arthritis    Back pain    Constipation    Diabetes mellitus    Diverticulosis    External hemorrhoids    Fibromyalgia    Gallbladder problem    GERD (gastroesophageal reflux disease)    Glaucoma    Glaucoma    Hemorrhoids    grade 3    Hip pain    IBS (irritable bowel syndrome)    IDA (iron deficiency anemia)    Iron deficiency anemia 06/05/2014   Irregular heartbeat    Leg edema    Leg numbness    Leg pain    Low tension glaucoma  Neuropathy, leg    Ovarian cyst 11/20/00   Prediabetes    Sleep apnea    CPAP   Sliding hiatal hernia     Past Surgical History   Past Surgical History:  Procedure Laterality Date   CARPAL TUNNEL RELEASE Bilateral    CATARACT EXTRACTION     bilateral   CHOLECYSTECTOMY     COLONOSCOPY  2005   Small external hemorrhoids, single sigmoid colon diverticulum, small polyp at the hepatic flexure focally adenomatous.   COLONOSCOPY  2010   Dr. Jena Gauss, Versed 4/Demerol 75. Pancolonic diverticula, external hemorrhoids. Colonoscopy every 5 years for history of adenomatous polyps   COLONOSCOPY  2014   Dr. Juanda Chance: Propofol. mild diverticulosis in the sigmoid colon, random colon biopsies benign.   COLONOSCOPY WITH PROPOFOL N/A 09/13/2015   Procedure: COLONOSCOPY WITH  PROPOFOL;  Surgeon: Corbin Ade, MD;  Location: [facility];  Service: Endoscopy;  Laterality: N/A;  1030   ESOPHAGEAL DILATION N/A 10/26/2014   Procedure: ESOPHAGEAL DILATION 54 FRENCH;  Surgeon: Corbin Ade, MD;  Location: [facility] ORS;  Service: Endoscopy;  Laterality: N/A;   ESOPHAGOGASTRODUODENOSCOPY  2010   Dr. Jena Gauss, distal esophageal erosions, Elease Hashimoto dilator passed. Comment small hiatal hernia, patulous EG junction.   ESOPHAGOGASTRODUODENOSCOPY (EGD) WITH PROPOFOL N/A 10/26/2014   Dr.Rourk- distal esophageal erosions c/w mild erosive reflux esophagitis. hiatal hernia, o/w normal EGD   ESOPHAGOGASTRODUODENOSCOPY (EGD) WITH PROPOFOL N/A 09/13/2015   Procedure: ESOPHAGOGASTRODUODENOSCOPY (EGD) WITH PROPOFOL;  Surgeon: Corbin Ade, MD;  Location: [facility];  Service: Endoscopy;  Laterality: N/A;   GIVENS CAPSULE STUDY N/A 10/26/2014   couple of mucosal breaks/erosions but essentially unremarkable small bowel study   HEMORRHOID BANDING     KNEE SURGERY     right-arthroscopy   OOPHORECTOMY  age 8s   PARTIAL HYSTERECTOMY  age 59s   RECTAL EXAM UNDER ANESTHESIA N/A 10/26/2014   circumferential grade 3 hemoorrhoids easily reducible   TEE WITHOUT CARDIOVERSION N/A 04/27/2022   Procedure: TRANSESOPHAGEAL ECHOCARDIOGRAM (TEE);  Surgeon: Antoine Poche, MD;  Location: [facility];  Service: Endoscopy;  Laterality: N/A;  pt knows to arrive at 8:30   thumb surger Bilateral    trigger thumb    Past Family History   Family History  Problem Relation Age of Onset   Ulcers Mother    Glaucoma Mother    Heart disease Mother    Diabetes Mother    Depression Mother    Sudden death Mother    Obesity Mother    Glaucoma Maternal Grandmother    Heart disease Maternal Grandmother    Heart disease Maternal Grandfather    Colon cancer Neg Hx     Past Social History   Social History   Socioeconomic History   Marital status: Married    Spouse name: J. Clarnce Flock, MD   Number of  children: 3   Years of education: Not on file   Highest education level: Not on file  Occupational History   Occupation: AUDIOLOGIST, retired  Tobacco Use   Smoking status: Never   Smokeless tobacco: Never   Tobacco comments:    Pt reports tried once in 7th grade//ms  Vaping Use   Vaping status: Never Used  Substance and Sexual Activity   Alcohol use: Not Currently    Alcohol/week: 1.0 standard drink of alcohol    Types: 1 Glasses of wine per week    Comment: rare   Drug use: No   Sexual activity: Never  Birth control/protection: None  Other Topics Concern   Not on file  Social History Narrative   3 children & married to orthopedist. 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   Social Drivers of Health   Financial Resource Strain: Patient Declined (06/05/2023)   Received from [facility]   Overall Financial Resource Strain (CARDIA)    Difficulty of Paying Living Expenses: Patient declined  Food Insecurity: No Food Insecurity (06/05/2023)   Received from [facility]   Hunger Vital Sign    Worried About Running Out of Food in the Last Year: Never true    Ran Out of Food in the Last Year: Never true  Transportation Needs: No Transportation Needs (06/05/2023)   Received from [facility] - Transportation    In the past 12 months, has lack of transportation kept you from medical appointments or from getting medications?: No    Lack of Transportation (Non-Medical): No  Physical Activity: Inactive (03/29/2020)   Exercise Vital Sign    Days of Exercise per Week: 0 days    Minutes of Exercise per Session: 0 min  Stress: Not on file  Social Connections: Not on file  Intimate Partner Violence: Not At Risk (04/24/2023)   Humiliation, Afraid, Rape, and Kick questionnaire    Fear of Current or Ex-Partner: No    Emotionally Abused: No    Physically Abused: No    Sexually Abused: No    Review of Systems   General:  Negative for anorexia, weight loss, fever, chills, fatigue, weakness. Eyes: Negative for vision changes.  ENT: Negative for hoarseness, difficulty swallowing , nasal congestion. CV: Negative for chest pain, angina, palpitations, dyspnea on exertion, peripheral edema.  Respiratory: Negative for dyspnea at rest, dyspnea on exertion, cough, sputum, wheezing.  GI: See history of present illness. GU:  Negative for dysuria, hematuria, urinary incontinence, urinary frequency, nocturnal urination.  MS: Negative for joint pain, low back pain.  Derm: Negative for rash or itching.  Neuro: Negative for weakness, abnormal sensation, seizure, frequent headaches, memory loss,  confusion.  Psych: Negative for anxiety, depression, suicidal ideation, hallucinations.  Endo: Negative for unusual weight change.  Heme: Negative for bruising or bleeding. Allergy: Negative for rash or hives.  Physical Exam   There were no vitals taken for this visit.   General: Well-nourished, well-developed in no acute distress.  Head: Normocephalic, atraumatic.   Eyes: Conjunctiva pink, no icterus. Mouth: Oropharyngeal mucosa moist and pink , no lesions erythema or exudate. Neck: Supple without thyromegaly, masses, or lymphadenopathy.  Lungs: Clear to auscultation bilaterally.  Heart: Regular rate and rhythm, no murmurs rubs or gallops.  Abdomen: Bowel sounds are normal, nontender, nondistended, no hepatosplenomegaly or masses,  no abdominal bruits or hernia, no rebound or guarding.   Rectal: *** Extremities: No lower extremity edema. No clubbing or deformities.  Neuro: Alert and oriented x 4 , grossly normal neurologically.  Skin: Warm and dry, no rash or jaundice.   Psych: Alert and cooperative, normal mood and affect.  Labs   *** Imaging Studies   No results found.  Assessment       PLAN   ***   Leanna Battles. Melvyn Neth, MHS, PA-C [facility]

## 2023-06-12 ENCOUNTER — Encounter: Payer: Self-pay | Admitting: Gastroenterology

## 2023-06-12 ENCOUNTER — Ambulatory Visit (INDEPENDENT_AMBULATORY_CARE_PROVIDER_SITE_OTHER): Admitting: Gastroenterology

## 2023-06-12 VITALS — BP 106/62 | HR 72 | Temp 97.3°F | Ht 64.0 in | Wt 214.8 lb

## 2023-06-12 DIAGNOSIS — R1310 Dysphagia, unspecified: Secondary | ICD-10-CM | POA: Diagnosis not present

## 2023-06-12 DIAGNOSIS — K449 Diaphragmatic hernia without obstruction or gangrene: Secondary | ICD-10-CM

## 2023-06-12 DIAGNOSIS — K219 Gastro-esophageal reflux disease without esophagitis: Secondary | ICD-10-CM

## 2023-06-12 MED ORDER — PANTOPRAZOLE SODIUM 40 MG PO TBEC: 40.0000 mg | DELAYED_RELEASE_TABLET | Freq: Every day | ORAL | 3 refills | Status: AC

## 2023-06-12 NOTE — Patient Instructions (Signed)
 Start pantoprazole 40mg  daily before breakfast. Prescription sent to [facility] [address]  Monitor your swallowing concerns, hopefully they should settle down in the next few weeks with better control of reflux.  Let me know if you have any ongoing symptoms and need further evaluation with upper endoscopy. We would need to wait appropriate timeframe after your Watchman's procedure before pursuing due to need for uninterrupted Eliquis.

## 2023-06-15 DIAGNOSIS — R600 Localized edema: Secondary | ICD-10-CM | POA: Diagnosis not present

## 2023-06-15 DIAGNOSIS — X58XXXA Exposure to other specified factors, initial encounter: Secondary | ICD-10-CM | POA: Diagnosis not present

## 2023-06-15 DIAGNOSIS — Y9301 Activity, walking, marching and hiking: Secondary | ICD-10-CM | POA: Diagnosis not present

## 2023-06-15 DIAGNOSIS — S86011A Strain of right Achilles tendon, initial encounter: Secondary | ICD-10-CM | POA: Diagnosis not present

## 2023-06-15 DIAGNOSIS — M7661 Achilles tendinitis, right leg: Secondary | ICD-10-CM | POA: Diagnosis not present

## 2023-06-15 DIAGNOSIS — M71871 Other specified bursopathies, right ankle and foot: Secondary | ICD-10-CM | POA: Diagnosis not present

## 2023-06-15 DIAGNOSIS — Y92512 Supermarket, store or market as the place of occurrence of the external cause: Secondary | ICD-10-CM | POA: Diagnosis not present

## 2023-06-15 DIAGNOSIS — M25871 Other specified joint disorders, right ankle and foot: Secondary | ICD-10-CM | POA: Diagnosis not present

## 2023-06-21 DIAGNOSIS — E1140 Type 2 diabetes mellitus with diabetic neuropathy, unspecified: Secondary | ICD-10-CM | POA: Diagnosis not present

## 2023-06-21 DIAGNOSIS — I480 Paroxysmal atrial fibrillation: Secondary | ICD-10-CM | POA: Diagnosis not present

## 2023-06-29 DIAGNOSIS — R262 Difficulty in walking, not elsewhere classified: Secondary | ICD-10-CM | POA: Diagnosis not present

## 2023-06-29 DIAGNOSIS — I480 Paroxysmal atrial fibrillation: Secondary | ICD-10-CM | POA: Diagnosis not present

## 2023-06-29 DIAGNOSIS — M6281 Muscle weakness (generalized): Secondary | ICD-10-CM | POA: Diagnosis not present

## 2023-06-29 NOTE — Unmapped External Note (Signed)
 Outpatient Cardiopulmonary Physical Therapy  Encounter Date: 06/29/2023  Name: Debra Harris DOB: 1950-07-31 MRN: [id number]   Patient seen today for: PT Initial Eval/Plan Of Care  Total Visits from Start of Care: 1  Diagnosis: (R26.2) Difficulty walking  (primary encounter diagnosis) (M62.81) Muscle weakness (generalized) (I48.0) Paroxysmal atrial fibrillation (CMS/HCC v28)  Referring Practitioner: Tobie Eligio [place], *  History of Present Illness  HPI as per chart review of patient's most recent discharge summary for her [facility] hospital admission 05/30/23 - 05/31/23 visit with Hart Marvel PA-C:  Ms. Denherder is a 73 y.o. female with a history of paroxysmal atrial fibrillation, hypertension, recurrent anemia/rectal bleeding requiring iron infusions, OSA on CPAP, and new diagnosis of type II diabetes mellitus who presented to [facility] on 05/30/23 for EPS with ablation and watchman implantation.    Patient underwent successful pulse field ablation with PVI and watchman implantation. She tolerated procedure well. Patient was observed overnight per protocol. She was able to ambulate and void without difficulty.    Patient will continue Eliquis for 3 months then followed by DAPT with ASA 81 mg and clopidogrel for 3 months. She will have an outpatient TEE scheduled in 45 days.    Subjective  Mrs. Cottam reports that she has had issues with her breathing for the past 15 years. She started wearing a CPAP for OSA 15 years ago, but has not had a recent sleep study. She has had multiple cardiac testing to rule out cardiac involovement with her dyspnea. She is s/p pulse field ablation and watchman implantation on 05/30/23. No change in her dyspnea since her procedure. She would like to return to an active lifestyle. She has been using her stationary bike for the last 2 weeks for 3 mile increments.    Start Time: 1030 Stop Time: 1120 Time Calculation (min): 50 min Onset Date:  05/30/23 MD Order Date: 05/30/23 Start of Care: 06/29/23 Intake profile and problem list reviewed by therapist: Yes Precautions/Contraindications/Weight Bearing Precautions: pafib, anemia, CAD, OSA on CPAP, Glaucoma, HTN Falls Risk: Yes Reason: age over 44 Comments: She has not had any recent falls and does not feel concerned about her balance. She does have neuropathy in both feet. Mental Status: Alert Oriented: All Pain :  (Heaviness in her chest at times. Hx of l/s surgery.)   Do you feel physically or emotionally UNSAFE where you currently live?: No Within the past 12 months, have you been hit, slapped, kicked or otherwise physically hurt by anyone? : No Within the past 12 months, have you been humiliated or emotionally abused by anyone? : No Do you have any specific cultural or spiritual beliefs that would impact delivery of your care?: (Patient-Rptd) (P) No Learning Preferences: (Patient-Rptd) (P) demonstration, verbal, written Barriers to Learning: (Patient-Rptd) (P) no barriers Living Situation: Lives in [place] and [place] with her supportive husband that is working part time as an Investment banker, operational. 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 Assistive Devices: No Cardiopulmonary HPI   Diet: regular    Diabetes Management: N/A    Pulmonary Hygiene/Airway Clearance       None    Greatest Difficulties: walking, inclines, making the bed, lifting/carrying objects, stair climbing, distance walking and vacuuming/sweeping    Oxygen Use     None    Pulmonary Equipment: CPAP     Comments: Apple watch to track HR and ECG    Prior Therapy     Cardiac Rehab:  Cardiac Rehab: no     Pulmonary Rehab:      Pulmonary Rehab: no    Currently Exercising: yes     Comments: Stationary bike - 3 times 3 miles each for the last 2 weeks    CVP Diagnostic Testing     Echocardiogram       Date: 04/22/23       Results and  interpretation: Echocardiogram 04/22/2023   Summary:  1. Left ventricle: The cavity size is normal. There is mild concentric  hypertrophy. Systolic function is normal. The estimated ejection fraction  is 60-65%. Wall motion is normal; there are no regional wall motion  abnormalities.  2.  Right ventricle: The cavity size is normal. Systolic function is  normal.  3.  Tricuspid valve: There is moderate regurgitation. There is no evidence  of pulmonary hypertension. RVSP is 33 mmHg.  4.  Inferior vena cava: The IVC is dilated. Respirophasic diameter changes  are in the normal range (> 50%).  5.  Pericardium, extracardiac: There is no pericardial effusion.  6.  When compared with prior study dated 02/15/2022, there is now mild  concentric LVH noted, tricuspid regurgitation severity has slightly  worsened and atrial fibrillation has replaced sinus rhythm. Otherwise,  there have been no significant interval changes.      Resting Vitals       SpO2: 98 %   Blood Pressure BP: 108/65 BP Location: Left upper arm BP Method: Manual BP Position: Sitting Cuff Size: Adult (Navy Blue)         Heart Rate: 61                       Evaluation   Strength     RUE: impaired, strength grossly 4-/5 with limited muscular endurance     LUE: impaired, strength grossly 4-/5 with limited muscular endurance     RLE: impaired, strength grossly 4-/5 with limited muscular endurance     LLE: impaired, strength grossly 4-/5 with limited muscular endurance    Sensation     RLE: decreased     LLE: decreased    Posture  rounded shoulders    Balance     Sitting: intact     Standing: intact    Gait  abnormal     Comments: Right knee genu varum, antalgic   Short Physical Performance Battery (SPPB)  Based on the score of the Short Physical Performance Battery the patient is at increased risk for falls and has minimal limitations.   Balance Score: 4   4 Meter Walk:        Time (seconds):  5       Gait Speed: 0.8 meters per second       Gait Speed Score: 3   5x Chair Stand      Time (seconds): 13      Chair Stand Score: 3    Total Score: 10   Falls Risk:  Chair stands > 15 seconds, Gait time > 6.6 seconds (<0.6 m/sec)  6 Minute Walk Test    Age (years): 73   Height (cm): 162.6   Weight (kg): 96.5  Pre Post  FIO2 (LPM)    Oxygen Delivery Device    SpO2 98 % 99 %  Heart Rate 61 79  Blood Pressure 108/65 118/70  RPE  4  DOE  3.5  Rests (standing/sitting)  None  Assistive Device  None  Distance (feet)  1030  Distance (meters)  313.94  Reason for Early Termination    Symptoms Reported    Percent Predicted Distance  85.5  Speed Walked (MPH)  1.95  Speed Walked (meters/minute)  52.32  Speed Walked (meters/second  0.87  Metabolic Equivalents (METS)  2.49     The patient ambulated 1030 feet on the 6 minute walk test, which is 85.5% of the predicted distance based on the patients age, gender, height and weight.  The predicted distance for the patient is  367.16 meters (1204.28 feet).     Based on this result, the patient has a moderate aerobic capacity and endurance impairment which are contributing to her decreased activity tolerance and symptoms.      [facility] Shortness of Breath Questionnaire (UCSD-SOBQ)  The patient scored 86/120 on the UCSD-SOBQ where a higher score indicates greater shortness of breath. Based on this score, the patient has a high degree of shortness of breath.         Treatment Note  Intake profile and problem list reviewed by therapist: Yes  Pain:    no pain reported    Therapeutic interventions performed with therapist demonstration and instruction. Modifications and adjustments made to interventions based on patient performance and tolerance of activities.  Cardiopulmonary Treatment   Breathing     Diaphragmatic Breathing: Therapist provided verbal education regarding proper diaphragmatic breathing  technique including minimization of accessory muscle use. Discussed proper diaphragmatic breathing pattern including use of pt's hand placement to provide tactile feedback. Therapist then instructed pt in seated diaphragmatic breathing exercise, and pt initially required consistent verbal cueing in order to obtain appropriate pattern, but with increased reps, pt demonstrated improved diaphragmatic breathing pattern with proper abdominal movement, however, did continue to demonstrate excessive chest excursion. Chest excursion continued to improve with the increased reps and therapist provided education to utilize tactile feedback to ensure proper pattern when practicing as part of HEP. Therapist also educated pt regarding seated versus supine positioning as related to resistance against diaphragm, encouraged pt to try breathing practice in supine at home. Therapist provided breathing techniques handout and instructed pt in continuation of diaphragmatic breathing exercises as part of HEP.       Pursed Lip Breathing: Education provided regarding pursed lip breathing technique and mechanism as related to CO2 expulsion.      Paced Breathing: Therapist provided education regarding appropriate pattern and technique with emphasis placed on inhale/exhale duration ratio - goal to achieve 2:4 ratio. Cued patient for focused paced breathing throughout aerobic activity at today's session.     Patient Education   Patient education provided to: patient   Method of education: verbal, demonstration and handouts   Education completed regarding: POC, therapy role, recommendations, precautions, symptom management and diagnosis specific education   Evidence of education: verbalized understanding, demonstrated skill and needs practice       Plan of Care    Current Functional Assessment and Reason for Continued Skilled Therapy     Patient presents to her PT evaluation to begin therapy to improve her activity tolerance  after her cardiac ablation and watchman implantation on 05/30/23. PMH is significant for pafib, anemia, CAD, OSA on CPAP, Glaucoma, HTN. Patient subjectively reports continued dyspnea with minimal exertion. She also experiences dyspnea at rest. Initial examination reveals impairments in aerobic endurance, upper extremity weakness, lower extremity weakness, ineffective breathing patterns, lack of home exercise program, decreased muscular endurance, and decreased mobility  limiting patient's ability to ambulate community distances, ambulate household distances, lift, bend, reach, carry, navigate  inclines, navigate stairs, navigate uneven surfaces, and participate in home resistance/endurance program. The patient ambulated a distance of 1030 ft during the , which indicates the patient is currently functioning at 85% of the predicted distance for the patient's age, gender, height, and weight matched norms. The patient is reporting 3 - moderate breathlessness with somewhat intense (4) exertion after completing the . Based on this, the pt has moderate aerobic capacity and endurance impairments which are contributing to decreased activity tolerance and increased shortness of breath with activity. Based on the pt's score of 10/12 on the SPPB, the pt presents with moderate deficits in overall functional mobility. The pt's balance score of 4/4 indicates that the pt presents with mild balance deficits and is not a fall risk. Based on the pt's self selected gait speed of .80 m/s, the pt is a fall risk. The pt completed the 5 x STS assessment in 13 seconds indicating mild functional strength deficits. Patient's score on the USCD-SOBQ indicates that patient subjectively reports high  difficulty performing home and community tasks due to severity of shortness of breath.Based on these measures and my clinical evaluation, the pt will benefit from skilled outpatient PT intervention to address the above listed impairments and  maximize the pt's cardiorespiratory endurance, strength, balance, safety, and independence at home and in the community.     Functional Limitations/Impairments     The patient presents with the following impairments and functional limitations: strength impairments, decreased flexibility, postural impairments, gait impairments, balance impairments, pulmonary insufficiency, functional endurance impairments, pain that limits function and sensation impairments     Patient's deficits require services that can only be performed by a therapist secondary to the patient requiring: analyzing/modifying performance, assistance for/with safety, developing compensatory strategies, environmental modifications, establishing a HEP, facilitating function, gait training, inhibition of abnormal/compensatory strategies, manual/physical assistance, providing instructions/education and verbal cueing.  Patient is an appropriate candidate for Outpatient Rehab services to maximize patient's prior level of function, independence and safety.     Duration/Frequency     Frequency: 2x/wk     Duration: 12 weeks   Planned Interventions Patient will participate in progressive, moderate intensity aerobic exercise beginning with shorter duration of 10 to 20 minutes with rest breaks and progressing to longer duration up to 60 minutes. Patient will also participate in progressive strength training focusing on major muscle groups in all limbs and core. Specific interventions to include: breathing retraining, chair exercises, postural exercises, ice/hot pack, neuromuscular retraining, balance training, strengthening, therapeutic exercise, progressive ambulation, Nustep/stationary biking, functional training, patient education, manual therapy modalities, home exercise program, airway clearance, transfer training, nutritional consult, smoking cessation consult and stress management/coping skills consult      Patient/Caregiver Agrees with Treatment  Plan: Yes   Rehab Potential     Rehab Potential: good for stated goals   Certification     Certification From: 06/29/2023     Certification To: 09/27/2023       Therapy Goals:  Patient Goals   Patient Goal: Improve breathing with ADL's and return to active lifestyle. Short Term Goal 1:   Goal:  Patient will consistently complete strength and endurance training at least 3 days per week to demonstrate adherence to exercise program.      Time frame:   6 weeks   Baseline:  Patient does not consistently follow a strength and endurance exercise routine at this time.     Goal met?:  New goal Short Term Goal 2:    Goal:  Patient will  ambulate for up to 10 minutes without rest breaks to demonstrate improved endurance and ability to access the community.     Time frame:  6 weeks   Baseline:  06/29/2023 : 6 minutes   Goal met?:  New goal Short Term Goal 3:    Goal:  Pt will ambulate for 6 continuous minutes with a distance greater than 1100 ft with stable vitals and </= 3/10 DOE to indicate improvements in cardiorespiratory endurance and functional mobility at home and in the community.   Time frame:  6 weeks   Baseline:  06/29/2023 : 1030' with 3.5 DOE   Goal met?:  New goal Long Term Goal 1:    Goal:  Patient will be independent with a consistent strength and endurance training program with progressions for continued exercise training after discharge.     Time frame:  12 weeks   Baseline:  Patient does not consistently follow an exercise routine at this time.     Goal met?:  New goal Long Term Goal 2:    Goal:  Patient will ambulate for > 10 minutes without a rest break to demonstrate improved endurance and ability to participate in community activities.     Time frame:  12 weeks   Baseline:  06/29/2023 : 6 minutes   Goal met?:  New goal Long Term Goal 3:    Goal:  [facility] Shortness of Breath Questionnaire score will decrease at least 5 points to demonstrate  improved subjective dyspnea with participation in daily activities.     Time frame:  12 weeks   Baseline:  06/29/2023 : 86   Goal met?:  New goal Long Term Goal 4:    Goal:  Pt will improve by 2 points on the SPPB to demonstrate improvements in functional limitations.     Time frame:  12 weeks   Baseline:  06/29/2023 : 10/12   Goal met?:  New goal       Therapy Time/Charges  Time Calculation Start Time: 1030 Stop Time: 1120 Time Calculation (min): 50 min  PT Evaluations/Test PT Eval/Test Charge 1: PT Evaluation Moderate Complexity Minutes:: 30  PT Timed Treatment Charges Skilled Intervention Charge 1: Therapeutic Activity Units: 1 Minutes:: 15 Total Timed Code Minutes:: 15             Electronically signed by: JON JAYSON ASAI, PT 06/29/2023 1:24 PM        Answers submitted by the patient for this visit: Shortness of Breath Questionnaire (Submitted on 06/29/2023) : 86

## 2023-07-02 DIAGNOSIS — I480 Paroxysmal atrial fibrillation: Secondary | ICD-10-CM | POA: Diagnosis not present

## 2023-07-02 DIAGNOSIS — I10 Essential (primary) hypertension: Secondary | ICD-10-CM | POA: Diagnosis not present

## 2023-07-02 DIAGNOSIS — I081 Rheumatic disorders of both mitral and tricuspid valves: Secondary | ICD-10-CM | POA: Diagnosis not present

## 2023-07-02 DIAGNOSIS — I2510 Atherosclerotic heart disease of native coronary artery without angina pectoris: Secondary | ICD-10-CM | POA: Diagnosis not present

## 2023-07-02 DIAGNOSIS — G4730 Sleep apnea, unspecified: Secondary | ICD-10-CM | POA: Diagnosis not present

## 2023-07-03 ENCOUNTER — Ambulatory Visit: Admitting: Orthopedic Surgery

## 2023-07-03 DIAGNOSIS — I480 Paroxysmal atrial fibrillation: Secondary | ICD-10-CM | POA: Diagnosis not present

## 2023-07-03 DIAGNOSIS — K625 Hemorrhage of anus and rectum: Secondary | ICD-10-CM | POA: Diagnosis not present

## 2023-07-03 DIAGNOSIS — I4891 Unspecified atrial fibrillation: Secondary | ICD-10-CM | POA: Diagnosis not present

## 2023-07-03 DIAGNOSIS — R079 Chest pain, unspecified: Secondary | ICD-10-CM | POA: Diagnosis not present

## 2023-07-04 ENCOUNTER — Other Ambulatory Visit (INDEPENDENT_AMBULATORY_CARE_PROVIDER_SITE_OTHER): Payer: Self-pay

## 2023-07-04 ENCOUNTER — Encounter: Payer: Self-pay | Admitting: Orthopedic Surgery

## 2023-07-04 ENCOUNTER — Ambulatory Visit: Admitting: Orthopedic Surgery

## 2023-07-04 DIAGNOSIS — M79601 Pain in right arm: Secondary | ICD-10-CM

## 2023-07-04 DIAGNOSIS — M47812 Spondylosis without myelopathy or radiculopathy, cervical region: Secondary | ICD-10-CM

## 2023-07-04 DIAGNOSIS — W19XXXA Unspecified fall, initial encounter: Secondary | ICD-10-CM | POA: Diagnosis not present

## 2023-07-04 DIAGNOSIS — M542 Cervicalgia: Secondary | ICD-10-CM

## 2023-07-04 NOTE — Progress Notes (Signed)
 Patient came in for xrays only after a fall today in the yard .

## 2023-07-05 ENCOUNTER — Encounter: Payer: Self-pay | Admitting: Orthopedic Surgery

## 2023-07-05 ENCOUNTER — Ambulatory Visit: Admitting: Orthopedic Surgery

## 2023-07-05 VITALS — BP 138/77 | HR 70 | Ht 64.0 in | Wt 214.0 lb

## 2023-07-05 DIAGNOSIS — G8911 Acute pain due to trauma: Secondary | ICD-10-CM | POA: Diagnosis not present

## 2023-07-05 DIAGNOSIS — M25511 Pain in right shoulder: Secondary | ICD-10-CM | POA: Diagnosis not present

## 2023-07-05 NOTE — Patient Instructions (Signed)
 Ice 48 hrs then heat

## 2023-07-05 NOTE — Progress Notes (Signed)
  Intake history:  BP 138/77   Pulse 70   Ht 5\' 4"  (1.626 m)   Wt 214 lb (97.1 kg)   BMI 36.73 kg/m  Body mass index is 36.73 kg/m.    WHAT ARE WE SEEING YOU FOR TODAY?   right shoulder  How long has this bothered you? (DOI?DOS?WS?)  on 07/04/23  Anticoag.  Yes  Diabetes No  Heart disease Yes  Hypertension No  SMOKING HX No  Kidney disease No  Any ALLERGIES ______________________________________________   Treatment:  Have you taken:  Tylenol  No  Advil No  Had PT No  Had injection No  Other  ________________Hydrocodone _________

## 2023-07-05 NOTE — Progress Notes (Signed)
 Chief Complaint  Patient presents with   Rudolfo Cosier yesterday neck and right shoulder pain      Treatment plan  Recommend ice for 48 hours and then start heat okay to move the arm Continue the hydrocodone   Reexamine the shoulder in a week if no improvement then we can do an MRI of the shoulder    Intake history:   BP 138/77   Pulse 70   Ht 5\' 4"  (1.626 m)   Wt 214 lb (97.1 kg)   BMI 36.73 kg/m  Body mass index is 36.73 kg/m.       WHAT ARE WE SEEING YOU FOR TODAY?    right shoulder   How long has this bothered you? (DOI?DOS?WS?)   on 07/04/23   Anticoag.  Yes   Diabetes No   Heart disease Yes   Hypertension No   SMOKING HX No   Kidney disease No   Any ALLERGIES ______________________________________________     Treatment:  Have you taken:  Tylenol  No  Advil No  Had PT No  Had injection No  Other  ________________Hydrocodone _________    73 year old female on anticoagulants fell at her home yesterday landed awkwardly  Comes in today complaining of right shoulder pain and some neck pain  She did hit her head has no concussive syndrome or symptoms  Pain is located over the right superior shoulder radiates somewhat into the neck and somewhat down towards the arm  No symptoms below the elbow  Complains of inability to abduct or flex the arm.  Gets most relief with the arm straight  Treatment so far includes ice and hydrocodone  with some relief but still has intense pain if she tries to move the arm   Allergies  Allergen Reactions   Injectafer  [Ferric Carboxymaltose ] Other (See Comments)    "Felt faint" "woozy" Has tolerated feraheme  without problems   Tizanidine  Swelling    Tongue swelling     No orders of the defined types were placed in this encounter.   BP 138/77   Pulse 70   Ht 5\' 4"  (1.626 m)   Wt 214 lb (97.1 kg)   BMI 36.73 kg/m   Cervical spine some mild tenderness really getting into the thoracic region  Superior  spinatus fossa is nontender  She is tender over the [facility] joint and acromion and in the rotator interval  There is no bruising or ecchymosis around the skin of the shoulder  Internal/external rotation does reproduce some of the pain as does abduction and extension  Extension passively 35 degrees abduction 60 degrees with pain  X-ray  DG Cervical Spine Complete Result Date: 07/05/2023 C-spine Acute trauma secondary to fall Mid cervical degenerative changes including joint space narrowing and osteophyte formation Mild foraminal stenosis in the mid cervical spine without acute fracture Overall alignment shows some changes in the cervical lordosis Impression cervical spondylosis no acute fracture   DG Humerus Right Result Date: 07/05/2023 Right humerus Acute trauma No evidence of fracture dislocation of the humerus No fracture of the humerus     No fracture on x-ray  I think she has either an AC joint injury or contusion right shoulder  No evidence of concussion syndrome  Treatment plan  Recommend ice for 48 hours and then start heat okay to move the arm Continue the hydrocodone   Reexamine the shoulder in a week if no improvement then we can do an MRI of the shoulder

## 2023-07-10 ENCOUNTER — Inpatient Hospital Stay: Payer: Medicare Other

## 2023-07-11 ENCOUNTER — Inpatient Hospital Stay: Attending: Hematology

## 2023-07-11 ENCOUNTER — Inpatient Hospital Stay: Payer: Medicare Other | Admitting: Physician Assistant

## 2023-07-11 DIAGNOSIS — D509 Iron deficiency anemia, unspecified: Secondary | ICD-10-CM | POA: Diagnosis not present

## 2023-07-11 DIAGNOSIS — K909 Intestinal malabsorption, unspecified: Secondary | ICD-10-CM | POA: Diagnosis not present

## 2023-07-11 DIAGNOSIS — E559 Vitamin D deficiency, unspecified: Secondary | ICD-10-CM | POA: Diagnosis not present

## 2023-07-11 DIAGNOSIS — K922 Gastrointestinal hemorrhage, unspecified: Secondary | ICD-10-CM | POA: Insufficient documentation

## 2023-07-11 DIAGNOSIS — D500 Iron deficiency anemia secondary to blood loss (chronic): Secondary | ICD-10-CM

## 2023-07-11 LAB — CBC WITH DIFFERENTIAL/PLATELET
Abs Immature Granulocytes: 0.02 10*3/uL (ref 0.00–0.07)
Basophils Absolute: 0 10*3/uL (ref 0.0–0.1)
Basophils Relative: 1 %
Eosinophils Absolute: 0.2 10*3/uL (ref 0.0–0.5)
Eosinophils Relative: 2 %
HCT: 35.3 % — ABNORMAL LOW (ref 36.0–46.0)
Hemoglobin: 11.3 g/dL — ABNORMAL LOW (ref 12.0–15.0)
Immature Granulocytes: 0 %
Lymphocytes Relative: 21 %
Lymphs Abs: 1.4 10*3/uL (ref 0.7–4.0)
MCH: 26.5 pg (ref 26.0–34.0)
MCHC: 32 g/dL (ref 30.0–36.0)
MCV: 82.9 fL (ref 80.0–100.0)
Monocytes Absolute: 0.5 10*3/uL (ref 0.1–1.0)
Monocytes Relative: 8 %
Neutro Abs: 4.4 10*3/uL (ref 1.7–7.7)
Neutrophils Relative %: 68 %
Platelets: 286 10*3/uL (ref 150–400)
RBC: 4.26 MIL/uL (ref 3.87–5.11)
RDW: 14.1 % (ref 11.5–15.5)
WBC: 6.5 10*3/uL (ref 4.0–10.5)
nRBC: 0 % (ref 0.0–0.2)

## 2023-07-11 LAB — IRON AND TIBC
Iron: 40 ug/dL (ref 28–170)
Saturation Ratios: 8 % — ABNORMAL LOW (ref 10.4–31.8)
TIBC: 508 ug/dL — ABNORMAL HIGH (ref 250–450)
UIBC: 468 ug/dL

## 2023-07-11 LAB — FERRITIN: Ferritin: 13 ng/mL (ref 11–307)

## 2023-07-11 NOTE — Progress Notes (Unsigned)
 [facility] [address] [place], [place] [address]   CLINIC:  Medical Oncology/Hematology  PCP:  Artemisa Bile, MD [address] [place] [address] [phone]   REASON FOR VISIT: Iron deficiency anemia  CURRENT THERAPY: Intermittent IV iron  INTERVAL HISTORY:  Debra Harris is contacted today for follow-up of her iron deficiency anemia.  She was last evaluated via telemedicine visit by Sheril Dines PA-C on 01/24/2023.  Most recent IV iron with Feraheme  x 2 on 01/26/2022 and 02/02/2022.  At today's visit, she reports feeling fair.  She has had a few interval changes in her health since her last visit, as described below: Diagnosed with atrial fibrillation, s/p ablation and placement of Watchman device at [facility] on 05/30/2023.  She remains on Eliquis for 3 to 6 months following procedure. Reports fall at home last week after she tripped.  She reports injuring her shoulder and striking her head, but denies any major injuries.  She reports increased rectal bleeding since being placed on Eliquis.  She will bleed intermittently, with episodes lasting 3 to 4 days and "dripping into the toilet" with bowel movements.  She is having to wear pads in her underwear, which will "fill up with blood over several hours."  Bleeding is worsened by constipation, so she discontinued her iron supplement since it was causing constipation.  She discussed with GI, who recommended to delay hemorrhoid banding until she is able to come off of Eliquis.  Patient reports increased fatigue, as well as some mild dyspnea on exertion and headaches.  She denies any pica, restless legs, chest pain, lightheadedness, or syncope. She has 25% energy and 100% appetite. She endorses that she is maintaining a stable weight.  ASSESSMENT & PLAN:  1.  Iron deficiency anemia: - Iron deficiency state from combination of malabsorption and chronic GI blood loss. - Colonoscopy on 09/13/2015  showing diverticuli in the sigmoid colon, descending colon and transverse colon, internal and external hemorrhoids banded.  - Last received IV iron with Feraheme  x2 in November/December 2023 - She had prior issues with rectal bleeding, thought to be secondary to internal hemorrhoids.  She has not had any recurrent rectal bleeding since hemorrhoid banding in April 2024. - Symptomatic with fatigue and dyspnea on exertion - Patient stopped taking her ferrous bisglycinate due to constipation that was causing worsening of her rectal bleeding - Most recent labs (07/11/2023): Hgb 11.3/MCV 82.9 ferritin 13, iron saturation 8%, elevated TIBC 508  - PLAN: Recommend IV Feraheme  x 2.  (Longer administration time requested by patient, to be completed over 1 hour) - Patient instructed to call us  if she feels that she needs IV iron before next visit. - Since she has had increased bleeding lately, we will check labs only in 3 months (CBC and iron panel) - Otherwise, we will repeat CBC with iron panel and PHONE visit in 6 months   2.  Vitamin D  deficiency: - Vitamin D  level was 19.96 (January 2022), and she has been taking vitamin D  2000 units daily since that time - increased to vitamin D  4000 units in November 2023 -- Most recent Vitamin D  (01/17/2023): Normalized at 32.93 - PLAN: Continue vitamin D  4000 units daily.  Will recheck vitamin D  annually (next due November 2025)   PLAN SUMMARY: >> IV Feraheme  x 2 (extended appointment) >> Labs ONLY in 3 months = CBC/D, ferritin, iron/TIBC >> Labs in 6 months = CBC/D, ferritin, iron/TIBC, vitamin D  >> PHONE visit  in 6 months (after labs)  ** Last office visit on 07/11/2023     REVIEW OF SYSTEMS:   Review of Systems  Constitutional:  Positive for fatigue. Negative for appetite change, chills, diaphoresis, fever and unexpected weight change.  HENT:   Negative for lump/mass and nosebleeds.   Eyes:  Negative for eye problems.  Respiratory:  Positive for  shortness of breath (With exertion). Negative for cough and hemoptysis.   Cardiovascular:  Negative for chest pain, leg swelling and palpitations.  Gastrointestinal:  Positive for constipation. Negative for abdominal pain, blood in stool, diarrhea, nausea and vomiting.  Genitourinary:  Negative for hematuria.   Skin: Negative.   Neurological:  Positive for headaches and numbness. Negative for dizziness and light-headedness.  Hematological:  Does not bruise/bleed easily.  Psychiatric/Behavioral:  Positive for sleep disturbance.      PHYSICAL EXAM:  ECOG PERFORMANCE STATUS: 1 - Symptomatic but completely ambulatory  Vitals:   07/12/23 1005 07/12/23 1010  BP: (!) 154/60 (!) 150/64  Pulse: 68   Resp: 18   Temp: 98.3 F (36.8 C)   SpO2: 99%    Filed Weights   07/12/23 1005  Weight: 209 lb 12.8 oz (95.2 kg)   Physical Exam Constitutional:      Appearance: Normal appearance. She is obese.  Cardiovascular:     Heart sounds: Normal heart sounds.  Pulmonary:     Breath sounds: Normal breath sounds.  Neurological:     General: No focal deficit present.     Mental Status: Mental status is at baseline.  Psychiatric:        Behavior: Behavior normal. Behavior is cooperative.     PAST MEDICAL/SURGICAL HISTORY:  Past Medical History:  Diagnosis Date   A-fib [facility])    recent diagnosis 03/2023 s/p ablation and Watchman 05/30/2023   Adenomatous colon polyp    Allergic rhinitis    Arthritis    Back pain    Constipation    Diabetes mellitus    Diverticulosis    External hemorrhoids    Fibromyalgia    Gallbladder problem    GERD (gastroesophageal reflux disease)    Glaucoma    Glaucoma    Hemorrhoids    grade 3    Hip pain    IBS (irritable bowel syndrome)    IDA (iron deficiency anemia)    Iron deficiency anemia 06/05/2014   Irregular heartbeat    Leg edema    Leg numbness    Leg pain    Low tension glaucoma    Neuropathy, leg    Ovarian cyst 11/20/2000   Prediabetes     Sleep apnea    CPAP   Sliding hiatal hernia    Past Surgical History:  Procedure Laterality Date   CARPAL TUNNEL RELEASE Bilateral    CATARACT EXTRACTION     bilateral   CHOLECYSTECTOMY     COLONOSCOPY  02/28/2003   Small external hemorrhoids, single sigmoid colon diverticulum, small polyp at the hepatic flexure focally adenomatous.   COLONOSCOPY  02/28/2008   Dr. Riley Cheadle, Versed  4/Demerol 75. Pancolonic diverticula, external hemorrhoids. Colonoscopy every 5 years for history of adenomatous polyps   COLONOSCOPY  02/28/2012   Dr. Grandville Lax: Propofol . mild diverticulosis in the sigmoid colon, random colon biopsies benign.   COLONOSCOPY WITH PROPOFOL  N/A 09/13/2015   Procedure: COLONOSCOPY WITH PROPOFOL ;  Surgeon: Suzette Espy, MD;  Location: [facility];  Service: Endoscopy;  Laterality: N/A;  1030   ESOPHAGEAL DILATION N/A 10/26/2014   Procedure:  ESOPHAGEAL DILATION 54 FRENCH;  Surgeon: Suzette Espy, MD;  Location: [facility] ORS;  Service: Endoscopy;  Laterality: N/A;   ESOPHAGOGASTRODUODENOSCOPY  02/28/2008   Dr. Riley Cheadle, distal esophageal erosions, Londa Rival dilator passed. Comment small hiatal hernia, patulous EG junction.   ESOPHAGOGASTRODUODENOSCOPY (EGD) WITH PROPOFOL  N/A 10/26/2014   Dr.Rourk- distal esophageal erosions c/w mild erosive reflux esophagitis. hiatal hernia, o/w normal EGD   ESOPHAGOGASTRODUODENOSCOPY (EGD) WITH PROPOFOL  N/A 09/13/2015   Procedure: ESOPHAGOGASTRODUODENOSCOPY (EGD) WITH PROPOFOL ;  Surgeon: Suzette Espy, MD;  Location: [facility];  Service: Endoscopy;  Laterality: N/A;   GIVENS CAPSULE STUDY N/A 10/26/2014   couple of mucosal breaks/erosions but essentially unremarkable small bowel study   HEMORRHOID BANDING  06/2022   KNEE SURGERY     right-arthroscopy   LEFT ATRIAL APPENDAGE OCCLUSION  05/30/2023   OOPHORECTOMY  age 54s   PARTIAL HYSTERECTOMY  age 52s   RECTAL EXAM UNDER ANESTHESIA N/A 10/26/2014   circumferential grade 3 hemoorrhoids easily  reducible   TEE WITHOUT CARDIOVERSION N/A 04/27/2022   Procedure: TRANSESOPHAGEAL ECHOCARDIOGRAM (TEE);  Surgeon: Laurann Pollock, MD;  Location: [facility] OR[facility];  Service: Endoscopy;  Laterality: N/A;  pt knows to arrive at 8:30   thumb surger Bilateral    trigger thumb    SOCIAL HISTORY:  Social History   Socioeconomic History   Marital status: Married    Spouse name: J. Mac Savers, MD   Number of children: 3   Years of education: Not on file   Highest education level: Not on file  Occupational History   Occupation: AUDIOLOGIST, retired  Tobacco Use   Smoking status: Never   Smokeless tobacco: Never   Tobacco comments:    Pt reports tried once in 7th grade//ms  Vaping Use   Vaping status: Never Used  Substance and Sexual Activity   Alcohol use: Not Currently    Alcohol/week: 1.0 standard drink of alcohol    Types: 1 Glasses of wine per week    Comment: rare   Drug use: No   Sexual activity: Never    Birth control/protection: None  Other Topics Concern   Not on file  Social History Narrative   3 children & married to orthopedist. 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   Social Drivers of Health   Financial Resource Strain: Patient Declined (06/05/2023)   Received from [facility]   Overall Financial Resource Strain (CARDIA)    Difficulty of Paying Living Expenses: Patient declined  Food Insecurity: No Food Insecurity (06/05/2023)   Received from [facility]   Hunger Vital Sign    Worried About Running Out of Food in the Last Year: Never true    Ran Out of Food in the Last Year: Never true  Transportation Needs: No Transportation Needs (06/05/2023)   Received from [facility] - Transportation    In the past 12 months, has lack of transportation kept you from medical appointments or from getting medications?: No    Lack of Transportation (Non-Medical): No  Physical Activity: Inactive (03/29/2020)    Exercise Vital Sign    Days of Exercise per Week: 0 days    Minutes of Exercise per Session: 0 min  Stress: Not on file  Social Connections: Not on file  Intimate Partner Violence: Not At Risk (04/24/2023)   Humiliation, Afraid, Rape, and Kick questionnaire    Fear of Current or Ex-Partner: No    Emotionally Abused: No  Physically Abused: No    Sexually Abused: No    FAMILY HISTORY:  Family History  Problem Relation Age of Onset   Ulcers Mother    Glaucoma Mother    Heart disease Mother    Diabetes Mother    Depression Mother    Sudden death Mother    Obesity Mother    Glaucoma Maternal Grandmother    Heart disease Maternal Grandmother    Heart disease Maternal Grandfather    Colon cancer Neg Hx     CURRENT MEDICATIONS:  Outpatient Encounter Medications as of 07/12/2023  Medication Sig Note   apixaban (ELIQUIS) 5 MG TABS tablet Take 5 mg by mouth 2 (two) times daily.    cetirizine (ZYRTEC) 10 MG tablet Take 10 mg by mouth daily. As needed    [START ON 08/29/2023] clopidogrel (PLAVIX) 75 MG tablet Take 75 mg by mouth.    cyclobenzaprine  (FLEXERIL ) 5 MG tablet Take 1 tablet (5 mg total) by mouth 3 (three) times daily as needed for muscle spasms.    docusate sodium (COLACE) 100 MG capsule Take 100 mg by mouth 2 (two) times daily. Take 2 capsules (200 mg total) by mouth nightly as needed for constipation.    furosemide (LASIX) 40 MG tablet Take 40 mg by mouth as needed for edema. by mouth daily as needed (for swelling, shortness of breath, and quick weight gain >3lbs overnight).    HYDROcodone -acetaminophen  (NORCO/VICODIN) 5-325 MG tablet Take 1 tablet by mouth every 6 (six) hours as needed for moderate pain (pain score 4-6).    LUMIGAN 0.01 % SOLN Place 1 drop into both eyes at bedtime.    MAGNESIUM PO Take 1 tablet by mouth at bedtime. 04/24/2023: Take 1 tablet (400 mg total) by mouth nightly.   metoprolol  succinate (TOPROL -XL) 50 MG 24 hr tablet Take 50 mg by mouth in the morning  and at bedtime.    Multiple Vitamins-Minerals (ZINC PO) Take 1 tablet by mouth in the morning.    NON FORMULARY CPAP at night     pantoprazole  (PROTONIX ) 40 MG tablet Take 1 tablet (40 mg total) by mouth daily before breakfast.    Wheat Dextrin (BENEFIBER) POWD Take 1 Dose by mouth daily. 04/24/2023: 1 tab daily    [DISCONTINUED] amiodarone  (PACERONE ) 200 MG tablet Take 200 mg by mouth daily.    No facility-administered encounter medications on file as of 07/12/2023.    ALLERGIES:  Allergies  Allergen Reactions   Injectafer  [Ferric Carboxymaltose ] Other (See Comments)    "Felt faint" "woozy" Has tolerated feraheme  without problems   Tizanidine  Swelling    Tongue swelling     LABORATORY DATA:  I have reviewed the labs as listed.  CBC    Component Value Date/Time   WBC 6.5 07/11/2023 1125   RBC 4.26 07/11/2023 1125   HGB 11.3 (L) 07/11/2023 1125   HCT 35.3 (L) 07/11/2023 1125   PLT 286 07/11/2023 1125   MCV 82.9 07/11/2023 1125   MCH 26.5 07/11/2023 1125   MCHC 32.0 07/11/2023 1125   RDW 14.1 07/11/2023 1125   LYMPHSABS 1.4 07/11/2023 1125   MONOABS 0.5 07/11/2023 1125   EOSABS 0.2 07/11/2023 1125   BASOSABS 0.0 07/11/2023 1125      Latest Ref Rng & Units 05/09/2023    5:50 PM 03/27/2022   10:41 AM 12/20/2021    3:08 PM  CMP  Glucose 70 - 99 mg/dL 621  99  308   BUN 8 - 23 mg/dL 18  10  12   Creatinine 0.44 - 1.00 mg/dL 7.84  6.96  2.95   Sodium 135 - 145 mmol/L 138  140  138   Potassium 3.5 - 5.1 mmol/L 4.0  3.9  3.6   Chloride 98 - 111 mmol/L 105  104  102   CO2 22 - 32 mmol/L 25  28  31    Calcium  8.9 - 10.3 mg/dL 8.9  9.2  9.7   Total Protein 6.0 - 8.3 g/dL   6.9   Total Bilirubin 0.2 - 1.2 mg/dL   0.4   Alkaline Phos 39 - 117 U/L   59   AST 0 - 37 U/L   15   ALT 0 - 35 U/L   15     DIAGNOSTIC IMAGING:  I have independently reviewed the relevant imaging and discussed with the patient.   WRAP UP:  All questions were answered. The patient knows to call the  clinic with any problems, questions or concerns.  Medical decision making: Moderate  Time spent on visit: I spent 20 minutes counseling the patient face to face. The total time spent in the appointment was 30 minutes and more than 50% was on counseling.  Sonnie Dusky, PA-C  07/12/2023 11:36 AM

## 2023-07-12 ENCOUNTER — Inpatient Hospital Stay (HOSPITAL_BASED_OUTPATIENT_CLINIC_OR_DEPARTMENT_OTHER): Admitting: Physician Assistant

## 2023-07-12 ENCOUNTER — Ambulatory Visit: Admitting: Orthopedic Surgery

## 2023-07-12 VITALS — BP 150/64 | HR 68 | Temp 98.3°F | Resp 18 | Wt 209.8 lb

## 2023-07-12 DIAGNOSIS — K922 Gastrointestinal hemorrhage, unspecified: Secondary | ICD-10-CM | POA: Diagnosis not present

## 2023-07-12 DIAGNOSIS — G8911 Acute pain due to trauma: Secondary | ICD-10-CM

## 2023-07-12 DIAGNOSIS — E559 Vitamin D deficiency, unspecified: Secondary | ICD-10-CM

## 2023-07-12 DIAGNOSIS — K909 Intestinal malabsorption, unspecified: Secondary | ICD-10-CM | POA: Diagnosis not present

## 2023-07-12 DIAGNOSIS — M25511 Pain in right shoulder: Secondary | ICD-10-CM | POA: Diagnosis not present

## 2023-07-12 DIAGNOSIS — S46011D Strain of muscle(s) and tendon(s) of the rotator cuff of right shoulder, subsequent encounter: Secondary | ICD-10-CM | POA: Diagnosis not present

## 2023-07-12 DIAGNOSIS — D500 Iron deficiency anemia secondary to blood loss (chronic): Secondary | ICD-10-CM | POA: Diagnosis not present

## 2023-07-12 DIAGNOSIS — D509 Iron deficiency anemia, unspecified: Secondary | ICD-10-CM | POA: Diagnosis not present

## 2023-07-12 NOTE — Progress Notes (Signed)
   There were no vitals taken for this visit.  There is no height or weight on file to calculate BMI.  No chief complaint on file.   Encounter Diagnoses  Name Primary?   Acute shoulder pain due to trauma, right Yes   Traumatic complete tear of right rotator cuff, subsequent encounter     DOI/DOS/ Date: May 7   Improved, in terms of pain but still having severe significant range of motion deficit  Active abduction is 30 degrees  Passive abduction less than 90  Forward elevation in the scapular plane painful at 80 degrees  Weakness in the scapular plane  Recommend MRI to rule out rotator cuff tear

## 2023-07-12 NOTE — Progress Notes (Signed)
  Encounter Diagnoses  Name Primary?   Acute shoulder pain due to trauma, right Yes   Traumatic complete tear of right rotator cuff, subsequent encounter     DOI/DOS/ Date: May 7   73 year old female fell in her home on May 7 No signs of concussive symptoms at this time    Improved, in terms of pain but still having severe significant range of motion deficit  Active abduction is 30 degrees  Passive abduction less than 90  Forward elevation in the scapular plane painful at 80 degrees  Weakness in the scapular plane  Recommend MRI to rule out rotator cuff tear

## 2023-07-12 NOTE — Patient Instructions (Signed)
 While we are working on your approval for MRI please go ahead and call to schedule your appointment with [facility] within at least one (1) week.   Central Scheduling [phone]

## 2023-07-19 DIAGNOSIS — I480 Paroxysmal atrial fibrillation: Secondary | ICD-10-CM | POA: Diagnosis not present

## 2023-07-19 DIAGNOSIS — M6281 Muscle weakness (generalized): Secondary | ICD-10-CM | POA: Diagnosis not present

## 2023-07-19 DIAGNOSIS — R262 Difficulty in walking, not elsewhere classified: Secondary | ICD-10-CM | POA: Diagnosis not present

## 2023-07-24 NOTE — Progress Notes (Signed)
 Outpatient Therapy Contact Note  Patient present and motivated for PT session. Unfortunately her resting HR was in the 160's and her KARDIA was reading atrial fibrillation. Debra Harris was in contact with her cardiologist this morning regarding possible atrial fibrillation and low blood pressure yesterday. She is waiting for medication recommendations. Recommended holding PT and exercise until her atrial fibrillation is medically managed and her resting heart rate is lower.

## 2023-08-01 ENCOUNTER — Inpatient Hospital Stay: Attending: Hematology

## 2023-08-01 VITALS — BP 119/63 | HR 60 | Temp 98.4°F | Resp 19 | Wt 208.0 lb

## 2023-08-01 DIAGNOSIS — K909 Intestinal malabsorption, unspecified: Secondary | ICD-10-CM | POA: Insufficient documentation

## 2023-08-01 DIAGNOSIS — D500 Iron deficiency anemia secondary to blood loss (chronic): Secondary | ICD-10-CM

## 2023-08-01 DIAGNOSIS — D509 Iron deficiency anemia, unspecified: Secondary | ICD-10-CM | POA: Diagnosis not present

## 2023-08-01 DIAGNOSIS — K625 Hemorrhage of anus and rectum: Secondary | ICD-10-CM

## 2023-08-01 MED ORDER — SODIUM CHLORIDE 0.9 % IV SOLN: Freq: Once | INTRAVENOUS | Status: AC

## 2023-08-01 MED ORDER — ACETAMINOPHEN 325 MG PO TABS
650.0000 mg | ORAL_TABLET | Freq: Once | ORAL | Status: AC
  Administered 2023-08-01: 650 mg via ORAL
  Filled 2023-08-01: qty 2

## 2023-08-01 MED ORDER — SODIUM CHLORIDE 0.9 % IV SOLN
510.0000 mg | Freq: Once | INTRAVENOUS | Status: AC
  Administered 2023-08-01: 510 mg via INTRAVENOUS
  Filled 2023-08-01: qty 510

## 2023-08-01 MED ORDER — CETIRIZINE HCL 10 MG/ML IV SOLN
10.0000 mg | Freq: Once | INTRAVENOUS | Status: AC
  Administered 2023-08-01: 10 mg via INTRAVENOUS
  Filled 2023-08-01: qty 1

## 2023-08-01 NOTE — Patient Instructions (Signed)

## 2023-08-01 NOTE — Progress Notes (Signed)
 Patient tolerated iron infusion with no complaints voiced.  Peripheral IV site clean and dry with good blood return noted before and after infusion.  Band aid applied. Pt observed post Ferheme infusion without any complications. VSS with discharge and left in satisfactory condition with no s/s of distress noted. All follow ups as scheduled.   Husein Guedes [employer]

## 2023-08-13 ENCOUNTER — Inpatient Hospital Stay

## 2023-08-13 VITALS — BP 132/71 | HR 65 | Temp 97.7°F | Resp 18 | Ht 64.0 in | Wt 207.0 lb

## 2023-08-13 DIAGNOSIS — K909 Intestinal malabsorption, unspecified: Secondary | ICD-10-CM | POA: Diagnosis not present

## 2023-08-13 DIAGNOSIS — D500 Iron deficiency anemia secondary to blood loss (chronic): Secondary | ICD-10-CM

## 2023-08-13 DIAGNOSIS — D509 Iron deficiency anemia, unspecified: Secondary | ICD-10-CM | POA: Diagnosis not present

## 2023-08-13 DIAGNOSIS — K625 Hemorrhage of anus and rectum: Secondary | ICD-10-CM

## 2023-08-13 MED ORDER — ACETAMINOPHEN 325 MG PO TABS
650.0000 mg | ORAL_TABLET | Freq: Once | ORAL | Status: AC
  Filled 2023-08-13: qty 2

## 2023-08-13 MED ORDER — CETIRIZINE HCL 10 MG/ML IV SOLN
10.0000 mg | Freq: Once | INTRAVENOUS | Status: AC
  Filled 2023-08-13: qty 1

## 2023-08-13 MED ORDER — SODIUM CHLORIDE 0.9 % IV SOLN: Freq: Once | INTRAVENOUS | Status: AC

## 2023-08-13 MED ORDER — SODIUM CHLORIDE 0.9 % IV SOLN
510.0000 mg | Freq: Once | INTRAVENOUS | Status: AC
  Filled 2023-08-13: qty 510

## 2023-08-13 NOTE — Progress Notes (Signed)
 Patient tolerated iron infusion with no complaints voiced.  Peripheral IV site clean and dry with good blood return noted before and after infusion.  Band aid applied.  VSS with discharge and left in satisfactory condition with no s/s of distress noted.

## 2023-08-13 NOTE — Progress Notes (Signed)
Patient presents today for iron infusion.  Patient is in satisfactory condition with no new complaints voiced.  Vital signs are stable.  We will proceed with infusion per provider orders.    Peripheral IV started with good blood return 

## 2023-08-13 NOTE — Patient Instructions (Signed)

## 2023-08-14 ENCOUNTER — Ambulatory Visit (HOSPITAL_COMMUNITY)
Admission: RE | Admit: 2023-08-14 | Discharge: 2023-08-14 | Disposition: A | Discharge status: Home / Self Care | Source: Ambulatory Visit | Attending: Orthopedic Surgery | Admitting: Orthopedic Surgery

## 2023-08-14 DIAGNOSIS — S46811A Strain of other muscles, fascia and tendons at shoulder and upper arm level, right arm, initial encounter: Secondary | ICD-10-CM | POA: Diagnosis not present

## 2023-08-14 DIAGNOSIS — M948X1 Other specified disorders of cartilage, shoulder: Secondary | ICD-10-CM | POA: Diagnosis not present

## 2023-08-14 DIAGNOSIS — M19011 Primary osteoarthritis, right shoulder: Secondary | ICD-10-CM | POA: Diagnosis not present

## 2023-08-14 DIAGNOSIS — G8911 Acute pain due to trauma: Secondary | ICD-10-CM | POA: Diagnosis not present

## 2023-08-14 DIAGNOSIS — M25511 Pain in right shoulder: Secondary | ICD-10-CM | POA: Insufficient documentation

## 2023-08-14 DIAGNOSIS — H401132 Primary open-angle glaucoma, bilateral, moderate stage: Secondary | ICD-10-CM | POA: Diagnosis not present

## 2023-08-15 ENCOUNTER — Other Ambulatory Visit: Payer: Self-pay | Admitting: Orthopedic Surgery

## 2023-08-15 ENCOUNTER — Telehealth: Payer: Self-pay | Admitting: Orthopedic Surgery

## 2023-08-15 DIAGNOSIS — G8929 Other chronic pain: Secondary | ICD-10-CM

## 2023-08-15 MED ORDER — HYDROCODONE-ACETAMINOPHEN 5-325 MG PO TABS: 1.0000 | ORAL_TABLET | Freq: Four times a day (QID) | ORAL | 0 refills | Status: DC | PRN

## 2023-08-15 NOTE — Progress Notes (Signed)
 Meds ordered this encounter  Medications   HYDROcodone-acetaminophen (NORCO/VICODIN) 5-325 MG tablet    Sig: Take 1 tablet by mouth every 6 (six) hours as needed for moderate pain (pain score 4-6).    Dispense:  30 tablet    Refill:  0

## 2023-08-15 NOTE — Telephone Encounter (Signed)
 Husband advised patient needs refill of pain meds Asking for appointment to review MRI scan shows tear  Where can we put her for follow up?

## 2023-08-17 ENCOUNTER — Other Ambulatory Visit: Payer: Self-pay | Admitting: Orthopedic Surgery

## 2023-08-17 ENCOUNTER — Telehealth: Payer: Self-pay

## 2023-08-17 DIAGNOSIS — G8929 Other chronic pain: Secondary | ICD-10-CM

## 2023-08-17 MED ORDER — HYDROCODONE-ACETAMINOPHEN 5-325 MG PO TABS: 1.0000 | ORAL_TABLET | Freq: Four times a day (QID) | ORAL | 0 refills | Status: DC | PRN

## 2023-08-17 NOTE — Telephone Encounter (Signed)
 Raynelle Callow called to let you know that [facility] did not have the Hydrocodone  and wants to know if you would please cancel that script and send it to the [facility] in [place]. I have changed pharmacies so it should be correct in system/  Hydrocodone -Acetaminophen  5/325 MG  Qty 30 Tablets  Take 1 tablet by mouth every 6 (six) hours as needed for moderate pain (pain score 4-6).    PATIENT IS USING [facility] IN [place]

## 2023-08-17 NOTE — Telephone Encounter (Signed)
 We need to work her in on Wednesday for appointment MRI review

## 2023-08-27 ENCOUNTER — Encounter: Payer: Self-pay | Admitting: Orthopedic Surgery

## 2023-08-27 ENCOUNTER — Ambulatory Visit (INDEPENDENT_AMBULATORY_CARE_PROVIDER_SITE_OTHER): Admitting: Orthopedic Surgery

## 2023-08-27 DIAGNOSIS — S46801D Unspecified injury of other muscles, fascia and tendons at shoulder and upper arm level, right arm, subsequent encounter: Secondary | ICD-10-CM

## 2023-08-27 DIAGNOSIS — S46011D Strain of muscle(s) and tendon(s) of the rotator cuff of right shoulder, subsequent encounter: Secondary | ICD-10-CM | POA: Diagnosis not present

## 2023-08-27 NOTE — Progress Notes (Signed)
    Chief Complaint  Patient presents with   Results    Right shoulder pain review MRI     Encounter Diagnoses  Name Primary?   Traumatic injury of right subscapularis tendon, subsequent encounter Yes   Traumatic incomplete tear of right rotator cuff, subsequent encounter     DOI/DOS/ Date: 07/04/23  Debra Harris has had the MRI we discussed that as well as her current symptoms.   She still has quite a bit of pain in the right shoulder and has difficulty using it and using her arm for certain activities.  She can assist the arm with her other hand to get it to about 90 degrees of flexion.  Interestingly enough her subscapularis tests show good resistance testing 4-5 out of 5 with a belly press test and the internal rotation resistance test.  However, abduction and flexion in the scapular plane remain weak at 3 and 2 out of 5   My interpretation of the MRI: The MRI shows a partial tear of the rotator cuff and a complete tear of the subscapularis.  Biceps tendinitis and tendinosis and synovitis.  Glenohumeral arthritis  Obviously the physical findings do not match the MRI findings.  Dr. Brenna [name] husband and I and [name] discussed are 3 options  #1 physical therapy  #2 arthroscopy and isolated subscapularis repair  #3 reverse total shoulder   After some deliberation we have discussed it to proceed with physical therapy first.  This will allow some time for the patient to get her iron test back after iron infusion.  She will come off Eliquis today and go on Plavix.  I have also discussed this case with Dr. Onesimo who is our in-house shoulder specialist.  I will see the patient in about 3 weeks to 4 weeks to see if we made any progress in the abduction and flexion and any decrease in pain

## 2023-08-27 NOTE — Progress Notes (Signed)
   There were no vitals taken for this visit.  There is no height or weight on file to calculate BMI.  Chief Complaint  Patient presents with   Results    Right shoulder pain     No diagnosis found.  DOI/DOS/ Date: 07/04/23

## 2023-08-27 NOTE — Patient Instructions (Signed)
 Physical therapy has been ordered for you at [facility]. They should call you to schedule, [phone] is the phone number to call, if you want to call to schedule.

## 2023-08-28 ENCOUNTER — Encounter (HOSPITAL_COMMUNITY): Payer: Self-pay | Admitting: Occupational Therapy

## 2023-08-28 ENCOUNTER — Other Ambulatory Visit: Payer: Self-pay

## 2023-08-28 ENCOUNTER — Ambulatory Visit (HOSPITAL_COMMUNITY): Attending: Orthopedic Surgery | Admitting: Occupational Therapy

## 2023-08-28 DIAGNOSIS — R29898 Other symptoms and signs involving the musculoskeletal system: Secondary | ICD-10-CM | POA: Insufficient documentation

## 2023-08-28 DIAGNOSIS — S46801D Unspecified injury of other muscles, fascia and tendons at shoulder and upper arm level, right arm, subsequent encounter: Secondary | ICD-10-CM | POA: Insufficient documentation

## 2023-08-28 DIAGNOSIS — M25511 Pain in right shoulder: Secondary | ICD-10-CM | POA: Diagnosis not present

## 2023-08-28 DIAGNOSIS — M25611 Stiffness of right shoulder, not elsewhere classified: Secondary | ICD-10-CM | POA: Diagnosis not present

## 2023-08-28 NOTE — Therapy (Signed)
 OUTPATIENT OCCUPATIONAL THERAPY ORTHO EVALUATION  Patient Name: Debra Harris MRN: [id number] DOB:22-Mar-1950, 73 y.o., female Today's Date: 08/28/2023   END OF SESSION:  OT End of Session - 08/28/23 1450     Visit Number 1    Number of Visits 12    Date for OT Re-Evaluation 10/09/23    Authorization Type 1) Medicare A & B 2) [employer]    Authorization Time Period no auth required    Progress Note Due on Visit 10    OT Start Time 1301    OT Stop Time 1334    OT Time Calculation (min) 33 min    Activity Tolerance Patient tolerated treatment well    Behavior During Therapy WFL for tasks assessed/performed          Past Medical History:  Diagnosis Date   A-fib (HCC)    recent diagnosis 03/2023 s/p ablation and Watchman 05/30/2023   Adenomatous colon polyp    Allergic rhinitis    Arthritis    Back pain    Constipation    Diabetes mellitus    Diverticulosis    External hemorrhoids    Fibromyalgia    Gallbladder problem    GERD (gastroesophageal reflux disease)    Glaucoma    Glaucoma    Hemorrhoids    grade 3    Hip pain    IBS (irritable bowel syndrome)    IDA (iron deficiency anemia)    Iron deficiency anemia 06/05/2014   Irregular heartbeat    Leg edema    Leg numbness    Leg pain    Low tension glaucoma    Neuropathy, leg    Ovarian cyst 11/20/2000   Prediabetes    Sleep apnea    CPAP   Sliding hiatal hernia    Past Surgical History:  Procedure Laterality Date   CARPAL TUNNEL RELEASE Bilateral    CATARACT EXTRACTION     bilateral   CHOLECYSTECTOMY     COLONOSCOPY  02/28/2003   Small external hemorrhoids, single sigmoid colon diverticulum, small polyp at the hepatic flexure focally adenomatous.   COLONOSCOPY  02/28/2008   Dr. Shaaron, Versed  4/Demerol 75. Pancolonic diverticula, external hemorrhoids. Colonoscopy every 5 years for history of adenomatous polyps   COLONOSCOPY  02/28/2012   Dr. Obie: Propofol . mild diverticulosis in the sigmoid  colon, random colon biopsies benign.   COLONOSCOPY WITH PROPOFOL  N/A 09/13/2015   Procedure: COLONOSCOPY WITH PROPOFOL ;  Surgeon: Lamar CHRISTELLA Shaaron, MD;  Location: [facility];  Service: Endoscopy;  Laterality: N/A;  1030   ESOPHAGEAL DILATION N/A 10/26/2014   Procedure: ESOPHAGEAL DILATION 54 FRENCH;  Surgeon: Lamar CHRISTELLA Shaaron, MD;  Location: [facility];  Service: Endoscopy;  Laterality: N/A;   ESOPHAGOGASTRODUODENOSCOPY  02/28/2008   Dr. Shaaron, distal esophageal erosions, Agapito dilator passed. Comment small hiatal hernia, patulous EG junction.   ESOPHAGOGASTRODUODENOSCOPY (EGD) WITH PROPOFOL  N/A 10/26/2014   Dr.Rourk- distal esophageal erosions c/w mild erosive reflux esophagitis. hiatal hernia, o/w normal EGD   ESOPHAGOGASTRODUODENOSCOPY (EGD) WITH PROPOFOL  N/A 09/13/2015   Procedure: ESOPHAGOGASTRODUODENOSCOPY (EGD) WITH PROPOFOL ;  Surgeon: Lamar CHRISTELLA Shaaron, MD;  Location: [facility];  Service: Endoscopy;  Laterality: N/A;   GIVENS CAPSULE STUDY N/A 10/26/2014   couple of mucosal breaks/erosions but essentially unremarkable small bowel study   HEMORRHOID BANDING  06/2022   KNEE SURGERY     right-arthroscopy   LEFT ATRIAL APPENDAGE OCCLUSION  05/30/2023   OOPHORECTOMY  age 61s   PARTIAL HYSTERECTOMY  age  40s   RECTAL EXAM UNDER ANESTHESIA N/A 10/26/2014   circumferential grade 3 hemoorrhoids easily reducible   TEE WITHOUT CARDIOVERSION N/A 04/27/2022   Procedure: TRANSESOPHAGEAL ECHOCARDIOGRAM (TEE);  Surgeon: Alvan Dorn FALCON, MD;  Location: [facility];  Service: Endoscopy;  Laterality: N/A;  pt knows to arrive at 8:30   thumb surger Bilateral    trigger thumb   Patient Active Problem List   Diagnosis Date Noted   Diplopia 05/24/2021   Neuropathy 05/24/2021   Bilateral leg and foot pain 05/27/2020   Bilateral sciatica 05/27/2020   Arthralgia of toe 05/27/2020   Sprain of anterior talofibular ligament of right ankle 01/30/2020   Concussion with no loss of consciousness 03/07/2019    Retrograde memory loss 03/07/2019   Vertigo due to brain injury (HCC) 03/07/2019   Nystagmus on abduction 03/07/2019   New onset of headaches 03/07/2019   Controlled type 2 diabetes mellitus with complication, without long-term current use of insulin  (HCC) 03/07/2019   Class 2 severe obesity with serious comorbidity and body mass index (BMI) of 35.0 to 35.9 in adult (HCC) 03/07/2019   Anemia 02/25/2019   Hemorrhoids 02/25/2019   History of hysterectomy 02/25/2019   Irritable bowel syndrome 02/25/2019   Closed fracture of nasal bones 07/29/2018   Obstruction of nasal valve 07/29/2018   Obesity (BMI 30-39.9) 08/22/2017   Other fatigue 07/25/2017   Shortness of breath on exertion 07/25/2017   Prediabetes 07/25/2017   Vitamin D  deficiency 07/25/2017   B12 nutritional deficiency 07/25/2017   Heart murmur 05/10/2016   Diverticulosis of colon without hemorrhage    IDA (iron deficiency anemia)    Rectal bleeding    History of melena    History of colonic polyps    Third degree hemorrhoids    Reflux esophagitis    Hiatal hernia    Dysphagia, pharyngoesophageal phase    Adenomatous colon polyp 10/16/2014   Melena 10/12/2014   Iron deficiency anemia 06/05/2014   Seasonal allergies 03/31/2014   Type 2 diabetes mellitus (HCC) 03/31/2014   Bilateral dry eyes 01/20/2014   Blepharochalasis 01/20/2014   H/O sleep apnea 01/20/2014   Pseudophakia of both eyes 01/20/2014   OSA (obstructive sleep apnea) 01/02/2011   HEMATOCHEZIA 12/28/2008   ABDOMINAL PAIN, LEFT LOWER QUADRANT 12/28/2008   History of colonic polyps 12/28/2008   GERD 12/25/2008   ARTHRITIS 12/25/2008   LOW BACK PAIN, ACUTE 12/25/2008   Dysphagia 12/25/2008   PUD, HX OF 12/25/2008    PCP: Dr. Gaither Langton  REFERRING PROVIDER: Dr. Taft Minerva  ONSET DATE: ~ 3 weeks ago  REFERRING DIAG: S46.011D (ICD-10-CM) - Traumatic complete tear of right rotator cuff, subsequent encounter   THERAPY DIAG:  Acute pain of right  shoulder  Stiffness of right shoulder, not elsewhere classified  Other symptoms and signs involving the musculoskeletal system  Rationale for Evaluation and Treatment: Rehabilitation  SUBJECTIVE:   SUBJECTIVE STATEMENT: S: I fell about 3 weeks ago. Pt accompanied by: self  PERTINENT HISTORY: Pt is a 73 y/o female presenting with right shoulder pain and confirmed RC tear s/p fall approximately 3 weeks ago. Pt reports initially she could not move the right arm, however has been working on stretching it and moving it and can now reach forward to a degree.   PRECAUTIONS: None  WEIGHT BEARING RESTRICTIONS: No  PAIN:  Are you having pain? Yes: NPRS scale: 2/10 Pain location: anterior shoulder and medial deltoid regions Pain description: aching, sore Aggravating factors: over using it Relieving factors:  rest  FALLS: Has patient fallen in last 6 months? Yes. Number of falls 2  PLOF: Independent  PATIENT GOALS: To have less pain and more use of the RUE  NEXT MD VISIT: 09/24/23  OBJECTIVE:   HAND DOMINANCE: Right  ADLs: Overall ADLs: Pt reports difficulty with any lifting including items, cannot reach overhead or behind the back. Pt is using compensatory strategies for ADLs, is using the left hand for most tasks instead of the dominant right. Pt reports difficulty sleeping, only sleeping for ~ 3-4 hours at a time with heat and/or ice.   FUNCTIONAL OUTCOME MEASURES: Upper Extremity Functional Scale (UEFS): 11/80=14%  UPPER EXTREMITY ROM:       Assessed in sitting, er/IR adducted  Active ROM Right eval  Shoulder flexion 82  Shoulder abduction 48  Shoulder internal rotation 90  Shoulder external rotation 50  (Blank rows = not tested)    Assessed in supine, er/IR adducted  Passive ROM Right eval  Shoulder flexion 102  Shoulder abduction 74  Shoulder internal rotation 90  Shoulder external rotation 42  (Blank rows = not tested)  UPPER EXTREMITY MMT:     Assessed  in sitting, er/IR adducted-assessed on observation due to pain  MMT Right eval  Shoulder flexion 3-/5  Shoulder abduction 2+/5  Shoulder internal rotation 3/5  Shoulder external rotation 3/5  (Blank rows = not tested)   SENSATION: WFL  EDEMA: None  COGNITION: Overall cognitive status: Within functional limits for tasks assessed  OBSERVATIONS: Mod fascial restrictions along right upper arm, anterior shoulder, trapezius, and scapular regions   TODAY'S TREATMENT:                                                                                                                              DATE:  -Table slides: flexion, abduction, er, 5 reps   PATIENT EDUCATION: Education details: Table slides Person educated: Patient Education method: Explanation, Demonstration, and Handouts Education comprehension: verbalized understanding and returned demonstration  HOME EXERCISE PROGRAM: Eval: table slides  GOALS: Goals reviewed with patient? Yes   SHORT TERM GOALS: Target date: 09/18/23  Pt will be provided with and educated on HEP to improve mobility in RUE required for use during ADL completion.   Goal status: INITIAL  2.  Pt will increase RUE P/ROM by 20+ degrees to improve ability to use RUE during dressing tasks with minimal compensatory techniques.   Goal status: INITIAL  3.  Pt will increase RUE strength to 3+/5 to improve ability to reach for items at waist to chest height during bathing and grooming tasks.   Goal status: INITIAL    LONG TERM GOALS: Target date: 10/09/23  Pt will decrease pain in RUE to 3/10 or less to improve ability to sleep for 2+ consecutive hours without waking due to pain.   Goal status: INITIAL  2.  Pt will decrease RUE fascial restrictions to min amounts or less to improve mobility required for  functional reaching tasks.   Goal status: INITIAL  3.  Pt will increase RUE A/ROM by 25+ degrees to improve ability to use RUE when reaching  overhead or behind back during dressing and bathing tasks.   Goal status: INITIAL  4.  Pt will increase RUE strength to 4/5 or greater to improve ability to use RUE when lifting or carrying items during meal preparation/housework/yardwork tasks.   Goal status: INITIAL   ASSESSMENT:  CLINICAL IMPRESSION: Patient is a 73 y.o. female who was seen today for occupational therapy evaluation for right RC tear. Pt presents with increased pain and fascial restrictions, decreased ROM, strength, and functional use of the RUE for approximately 3 weeks after a fall. Pt has been completing some exercises with the RUE and has noted improvement since the initial injury.    PERFORMANCE DEFICITS: in functional skills including in functional skills including ADLs, IADLs, coordination, tone, ROM, strength, pain, fascial restrictions, muscle spasms, and UE functional use  IMPAIRMENTS: are limiting patient from ADLs, IADLs, rest and sleep, and leisure.   COMORBIDITIES: has no other co-morbidities that affects occupational performance. Patient will benefit from skilled OT to address above impairments and improve overall function.  MODIFICATION OR ASSISTANCE TO COMPLETE EVALUATION: No modification of tasks or assist necessary to complete an evaluation.  OT OCCUPATIONAL PROFILE AND HISTORY: Problem focused assessment: Including review of records relating to presenting problem.  CLINICAL DECISION MAKING: LOW - limited treatment options, no task modification necessary  REHAB POTENTIAL: Good  EVALUATION COMPLEXITY: Low      PLAN:  OT FREQUENCY: 2x/week  OT DURATION: 6 weeks  PLANNED INTERVENTIONS: 97168 OT Re-evaluation, 97535 self care/ADL training, 02889 therapeutic exercise, 97530 therapeutic activity, 97112 neuromuscular re-education, 97140 manual therapy, 97035 ultrasound, 97014 electrical stimulation unattended, patient/family education, and DME and/or AE instructions  RECOMMENDED OTHER SERVICES:  None at this time  CONSULTED AND AGREED WITH PLAN OF CARE: Patient  PLAN FOR NEXT SESSION: Follow up on HEP, manual techniques, passive stretching, AA/ROM progressing to A/ROM if able, scapular mobility/stability work   Sonny Cory, OTR/L  [phone] 08/28/2023, 2:51 PM

## 2023-08-28 NOTE — Patient Instructions (Signed)
 1) SHOULDER: Flexion On Table   Place hands on towel placed on table, elbows straight. Lean forward with you upper body, pushing towel away from body. Repeat 10 times. Do 3 sessions per day.  2) Abduction (Passive)   With arm out to side, resting on towel placed on table with palm DOWN, keeping trunk away from table, lean to the side while pushing towel away from body.  Repeat 10 times. Do 3 sessions per day.  Copyright  [employer]. All rights reserved.     3) Internal Rotation (Assistive)   Seated with elbow bent at right angle and held against side, slide arm on table surface in an inward arc keeping elbow anchored in place. Repeat 10 times. Do 3 sessions per day. Activity: Use this motion to brush crumbs off the table.  Copyright  VHI. All rights reserved.

## 2023-09-04 ENCOUNTER — Telehealth: Payer: Self-pay | Admitting: Cardiology

## 2023-09-04 NOTE — Telephone Encounter (Signed)
 Patient is being seen by another Dr at a different office. Please advise

## 2023-09-04 NOTE — Telephone Encounter (Signed)
 Noted. Will forward to schedulers and providers as an BURUNDI.

## 2023-09-05 DIAGNOSIS — E1140 Type 2 diabetes mellitus with diabetic neuropathy, unspecified: Secondary | ICD-10-CM | POA: Diagnosis not present

## 2023-09-05 DIAGNOSIS — Z79899 Other long term (current) drug therapy: Secondary | ICD-10-CM | POA: Diagnosis not present

## 2023-09-06 ENCOUNTER — Encounter (HOSPITAL_COMMUNITY): Admitting: Occupational Therapy

## 2023-09-06 DIAGNOSIS — E1140 Type 2 diabetes mellitus with diabetic neuropathy, unspecified: Secondary | ICD-10-CM | POA: Diagnosis not present

## 2023-09-06 DIAGNOSIS — I480 Paroxysmal atrial fibrillation: Secondary | ICD-10-CM | POA: Diagnosis not present

## 2023-09-11 DIAGNOSIS — I4891 Unspecified atrial fibrillation: Secondary | ICD-10-CM | POA: Diagnosis not present

## 2023-09-11 DIAGNOSIS — M7661 Achilles tendinitis, right leg: Secondary | ICD-10-CM | POA: Diagnosis not present

## 2023-09-12 ENCOUNTER — Ambulatory Visit (HOSPITAL_COMMUNITY): Admitting: Occupational Therapy

## 2023-09-12 ENCOUNTER — Encounter (HOSPITAL_COMMUNITY): Payer: Self-pay | Admitting: Occupational Therapy

## 2023-09-12 DIAGNOSIS — M25611 Stiffness of right shoulder, not elsewhere classified: Secondary | ICD-10-CM | POA: Diagnosis not present

## 2023-09-12 DIAGNOSIS — S46801D Unspecified injury of other muscles, fascia and tendons at shoulder and upper arm level, right arm, subsequent encounter: Secondary | ICD-10-CM | POA: Diagnosis not present

## 2023-09-12 DIAGNOSIS — R29898 Other symptoms and signs involving the musculoskeletal system: Secondary | ICD-10-CM | POA: Diagnosis not present

## 2023-09-12 DIAGNOSIS — M25511 Pain in right shoulder: Secondary | ICD-10-CM

## 2023-09-12 NOTE — Therapy (Signed)
 OUTPATIENT OCCUPATIONAL THERAPY ORTHO TREATMENT NOTE  Patient Name: Debra Harris MRN: [id number] DOB:10/25/1950, 73 y.o., female Today's Date: 09/12/2023   END OF SESSION:  OT End of Session - 09/12/23 1424     Visit Number 2    Number of Visits 12    Date for OT Re-Evaluation 10/09/23    Authorization Type 1) Medicare A & B 2) [employer]    Authorization Time Period no auth required    Progress Note Due on Visit 10    OT Start Time 1342    OT Stop Time 1424    OT Time Calculation (min) 42 min    Activity Tolerance Patient tolerated treatment well    Behavior During Therapy WFL for tasks assessed/performed           Past Medical History:  Diagnosis Date   A-fib (HCC)    recent diagnosis 03/2023 s/p ablation and Watchman 05/30/2023   Adenomatous colon polyp    Allergic rhinitis    Arthritis    Back pain    Constipation    Diabetes mellitus    Diverticulosis    External hemorrhoids    Fibromyalgia    Gallbladder problem    GERD (gastroesophageal reflux disease)    Glaucoma    Glaucoma    Hemorrhoids    grade 3    Hip pain    IBS (irritable bowel syndrome)    IDA (iron deficiency anemia)    Iron deficiency anemia 06/05/2014   Irregular heartbeat    Leg edema    Leg numbness    Leg pain    Low tension glaucoma    Neuropathy, leg    Ovarian cyst 11/20/2000   Prediabetes    Sleep apnea    CPAP   Sliding hiatal hernia    Past Surgical History:  Procedure Laterality Date   CARPAL TUNNEL RELEASE Bilateral    CATARACT EXTRACTION     bilateral   CHOLECYSTECTOMY     COLONOSCOPY  02/28/2003   Small external hemorrhoids, single sigmoid colon diverticulum, small polyp at the hepatic flexure focally adenomatous.   COLONOSCOPY  02/28/2008   Dr. Shaaron, Versed  4/Demerol 75. Pancolonic diverticula, external hemorrhoids. Colonoscopy every 5 years for history of adenomatous polyps   COLONOSCOPY  02/28/2012   Dr. Obie: Propofol . mild diverticulosis in the  sigmoid colon, random colon biopsies benign.   COLONOSCOPY WITH PROPOFOL  N/A 09/13/2015   Procedure: COLONOSCOPY WITH PROPOFOL ;  Surgeon: Lamar CHRISTELLA Shaaron, MD;  Location: [facility];  Service: Endoscopy;  Laterality: N/A;  1030   ESOPHAGEAL DILATION N/A 10/26/2014   Procedure: ESOPHAGEAL DILATION 54 FRENCH;  Surgeon: Lamar CHRISTELLA Shaaron, MD;  Location: [facility];  Service: Endoscopy;  Laterality: N/A;   ESOPHAGOGASTRODUODENOSCOPY  02/28/2008   Dr. Shaaron, distal esophageal erosions, Agapito dilator passed. Comment small hiatal hernia, patulous EG junction.   ESOPHAGOGASTRODUODENOSCOPY (EGD) WITH PROPOFOL  N/A 10/26/2014   Dr.Rourk- distal esophageal erosions c/w mild erosive reflux esophagitis. hiatal hernia, o/w normal EGD   ESOPHAGOGASTRODUODENOSCOPY (EGD) WITH PROPOFOL  N/A 09/13/2015   Procedure: ESOPHAGOGASTRODUODENOSCOPY (EGD) WITH PROPOFOL ;  Surgeon: Lamar CHRISTELLA Shaaron, MD;  Location: [facility];  Service: Endoscopy;  Laterality: N/A;   GIVENS CAPSULE STUDY N/A 10/26/2014   couple of mucosal breaks/erosions but essentially unremarkable small bowel study   HEMORRHOID BANDING  06/2022   KNEE SURGERY     right-arthroscopy   LEFT ATRIAL APPENDAGE OCCLUSION  05/30/2023   OOPHORECTOMY  age 9s   PARTIAL HYSTERECTOMY  age 56s   RECTAL EXAM UNDER ANESTHESIA N/A 10/26/2014   circumferential grade 3 hemoorrhoids easily reducible   TEE WITHOUT CARDIOVERSION N/A 04/27/2022   Procedure: TRANSESOPHAGEAL ECHOCARDIOGRAM (TEE);  Surgeon: Alvan Dorn FALCON, MD;  Location: [facility];  Service: Endoscopy;  Laterality: N/A;  pt knows to arrive at 8:30   thumb surger Bilateral    trigger thumb   Patient Active Problem List   Diagnosis Date Noted   Diplopia 05/24/2021   Neuropathy 05/24/2021   Bilateral leg and foot pain 05/27/2020   Bilateral sciatica 05/27/2020   Arthralgia of toe 05/27/2020   Sprain of anterior talofibular ligament of right ankle 01/30/2020   Concussion with no loss of consciousness  03/07/2019   Retrograde memory loss 03/07/2019   Vertigo due to brain injury (HCC) 03/07/2019   Nystagmus on abduction 03/07/2019   New onset of headaches 03/07/2019   Controlled type 2 diabetes mellitus with complication, without long-term current use of insulin  (HCC) 03/07/2019   Class 2 severe obesity with serious comorbidity and body mass index (BMI) of 35.0 to 35.9 in adult (HCC) 03/07/2019   Anemia 02/25/2019   Hemorrhoids 02/25/2019   History of hysterectomy 02/25/2019   Irritable bowel syndrome 02/25/2019   Closed fracture of nasal bones 07/29/2018   Obstruction of nasal valve 07/29/2018   Obesity (BMI 30-39.9) 08/22/2017   Other fatigue 07/25/2017   Shortness of breath on exertion 07/25/2017   Prediabetes 07/25/2017   Vitamin D  deficiency 07/25/2017   B12 nutritional deficiency 07/25/2017   Heart murmur 05/10/2016   Diverticulosis of colon without hemorrhage    IDA (iron deficiency anemia)    Rectal bleeding    History of melena    History of colonic polyps    Third degree hemorrhoids    Reflux esophagitis    Hiatal hernia    Dysphagia, pharyngoesophageal phase    Adenomatous colon polyp 10/16/2014   Melena 10/12/2014   Iron deficiency anemia 06/05/2014   Seasonal allergies 03/31/2014   Type 2 diabetes mellitus (HCC) 03/31/2014   Bilateral dry eyes 01/20/2014   Blepharochalasis 01/20/2014   H/O sleep apnea 01/20/2014   Pseudophakia of both eyes 01/20/2014   OSA (obstructive sleep apnea) 01/02/2011   HEMATOCHEZIA 12/28/2008   ABDOMINAL PAIN, LEFT LOWER QUADRANT 12/28/2008   History of colonic polyps 12/28/2008   GERD 12/25/2008   ARTHRITIS 12/25/2008   LOW BACK PAIN, ACUTE 12/25/2008   Dysphagia 12/25/2008   PUD, HX OF 12/25/2008    PCP: Dr. Gaither Langton  REFERRING PROVIDER: Dr. Taft Minerva  ONSET DATE: ~ 3 weeks ago  REFERRING DIAG: S46.011D (ICD-10-CM) - Traumatic complete tear of right rotator cuff, subsequent encounter   THERAPY DIAG:  Acute  pain of right shoulder  Stiffness of right shoulder, not elsewhere classified  Other symptoms and signs involving the musculoskeletal system  Rationale for Evaluation and Treatment: Rehabilitation  SUBJECTIVE:   SUBJECTIVE STATEMENT: S: I am sore today. Pt accompanied by: self  PERTINENT HISTORY: Pt is a 73 y/o female presenting with right shoulder pain and confirmed RC tear s/p fall approximately 3 weeks ago. Pt reports initially she could not move the right arm, however has been working on stretching it and moving it and can now reach forward to a degree.   PRECAUTIONS: None  WEIGHT BEARING RESTRICTIONS: No  PAIN:  Are you having pain? Yes: NPRS scale: 2/10 Pain location: anterior shoulder and medial deltoid regions Pain description: aching, sore Aggravating factors: over using it Relieving factors: rest  FALLS: Has patient fallen in last 6 months? Yes. Number of falls 2  PLOF: Independent  PATIENT GOALS: To have less pain and more use of the RUE  NEXT MD VISIT: 09/24/23  OBJECTIVE:   HAND DOMINANCE: Right  ADLs: Overall ADLs: Pt reports difficulty with any lifting including items, cannot reach overhead or behind the back. Pt is using compensatory strategies for ADLs, is using the left hand for most tasks instead of the dominant right. Pt reports difficulty sleeping, only sleeping for ~ 3-4 hours at a time with heat and/or ice.   FUNCTIONAL OUTCOME MEASURES: Upper Extremity Functional Scale (UEFS): 11/80=14%  UPPER EXTREMITY ROM:       Assessed in sitting, er/IR adducted  Active ROM Right eval  Shoulder flexion 82  Shoulder abduction 48  Shoulder internal rotation 90  Shoulder external rotation 50  (Blank rows = not tested)    Assessed in supine, er/IR adducted  Passive ROM Right eval  Shoulder flexion 102  Shoulder abduction 74  Shoulder internal rotation 90  Shoulder external rotation 42  (Blank rows = not tested)  UPPER EXTREMITY MMT:      Assessed in sitting, er/IR adducted-assessed on observation due to pain  MMT Right eval  Shoulder flexion 3-/5  Shoulder abduction 2+/5  Shoulder internal rotation 3/5  Shoulder external rotation 3/5  (Blank rows = not tested)   SENSATION: WFL  EDEMA: None  COGNITION: Overall cognitive status: Within functional limits for tasks assessed  OBSERVATIONS: Mod fascial restrictions along right upper arm, anterior shoulder, trapezius, and scapular regions   TODAY'S TREATMENT:                                                                                                                              DATE:   09/12/23 -Manual Therapy: -P/ROM: flexion, abduction, er/IR, x6 -AA/ROM: supine, flexion, abduction, protraction, horizontal abduction, er/IR, x10 -Isometrics: supine in therapist hand, flexion, abduction, extension, IR, ER, 4x10    PATIENT EDUCATION: Education details: AA/ROM and Isometrics Person educated: Patient Education method: Programmer, multimedia, Facilities manager, and Handouts Education comprehension: verbalized understanding and returned demonstration  HOME EXERCISE PROGRAM: Eval: table slides 7/16: AA/ROM and Isometrics  GOALS: Goals reviewed with patient? Yes   SHORT TERM GOALS: Target date: 09/18/23  Pt will be provided with and educated on HEP to improve mobility in RUE required for use during ADL completion.   Goal status: IN PROGRESS  2.  Pt will increase RUE P/ROM by 20+ degrees to improve ability to use RUE during dressing tasks with minimal compensatory techniques.   Goal status: IN PROGRESS  3.  Pt will increase RUE strength to 3+/5 to improve ability to reach for items at waist to chest height during bathing and grooming tasks.   Goal status: IN PROGRESS    LONG TERM GOALS: Target date: 10/09/23  Pt will decrease pain in RUE to 3/10 or less to improve ability to sleep for 2+ consecutive hours  without waking due to pain.   Goal status: IN  PROGRESS  2.  Pt will decrease RUE fascial restrictions to min amounts or less to improve mobility required for functional reaching tasks.   Goal status: IN PROGRESS  3.  Pt will increase RUE A/ROM by 25+ degrees to improve ability to use RUE when reaching overhead or behind back during dressing and bathing tasks.   Goal status: IN PROGRESS  4.  Pt will increase RUE strength to 4/5 or greater to improve ability to use RUE when lifting or carrying items during meal preparation/housework/yardwork tasks.   Goal status: IN PROGRESS   ASSESSMENT:  CLINICAL IMPRESSION: Pt presenting for first OT treatment session. She is demonstrating continued pain and ROM limitations in abduction and horizontal abduction, with mild limitations in flexion. Pain is the biggest limiting factor in all movements. OT started isometrics for stability and low level strengthening, which pt also had poor tolerance for. Verbal and tactile cuing provided for positioning and technique throughout session.   PERFORMANCE DEFICITS: in functional skills including in functional skills including ADLs, IADLs, coordination, tone, ROM, strength, pain, fascial restrictions, muscle spasms, and UE functional use   PLAN:  OT FREQUENCY: 2x/week  OT DURATION: 6 weeks  PLANNED INTERVENTIONS: 97168 OT Re-evaluation, 97535 self care/ADL training, 02889 therapeutic exercise, 97530 therapeutic activity, 97112 neuromuscular re-education, 97140 manual therapy, 97035 ultrasound, 97014 electrical stimulation unattended, patient/family education, and DME and/or AE instructions  RECOMMENDED OTHER SERVICES: None at this time  CONSULTED AND AGREED WITH PLAN OF CARE: Patient  PLAN FOR NEXT SESSION: Follow up on HEP, manual techniques, passive stretching, AA/ROM progressing to A/ROM if able, scapular mobility/stability work   Valentin Nightingale, OTR/L [phone] 09/12/2023, 4:27 PM

## 2023-09-12 NOTE — Patient Instructions (Signed)
 Perform each exercise ____10-15____ reps. 2-3x days.   1) Protraction   Start by holding a wand or cane at chest height.  Next, slowly push the wand outwards in front of your body so that your elbows become fully straightened. Then, return to the original position.     2) Shoulder FLEXION   In the standing position, hold a wand/cane with both arms, palms down on both sides. Raise up the wand/cane allowing your unaffected arm to perform most of the effort. Your affected arm should be partially relaxed.      3) Internal/External ROTATION   In the standing position, hold a wand/cane with both hands keeping your elbows bent. Move your arms and wand/cane to one side.  Your affected arm should be partially relaxed while your unaffected arm performs most of the effort.       4) Shoulder ABDUCTION   While holding a wand/cane palm face up on the injured side and palm face down on the uninjured side, slowly raise up your injured arm to the side.        5) Horizontal Abduction/Adduction      Straight arms holding cane at shoulder height, bring cane to right, center, left. Repeat starting to left.   Copyright  [employer]. All rights reserved.      Complete the following 1-2 a day. Hold for 10 seconds. Complete 5-8 sets for each.   1) SHOULDER - ISOMETRIC FLEXION  Gently push your fist forward into a wall with your elbow bent.    2) SHOULDER - ISOMETRIC EXTENSION  Gently push your a bent elbow back into a wall.    3) SHOULDER - ISOMETRIC INTERNAL ROTATION   Gently press your hand into a wall using the palm side of your hand.  Maintain a bent elbow the entire time.        4) SHOULDER - ISOMETRIC ADDUCTION  Gently push your elbow into the side of your body.   5) SHOULDER - ISOMETRIC ABDUCTION  Gently push your elbow out to the side into a wall with your elbow bent.

## 2023-09-17 ENCOUNTER — Encounter (HOSPITAL_COMMUNITY): Admitting: Occupational Therapy

## 2023-09-19 ENCOUNTER — Encounter (HOSPITAL_COMMUNITY): Payer: Self-pay | Admitting: Occupational Therapy

## 2023-09-19 ENCOUNTER — Ambulatory Visit (HOSPITAL_COMMUNITY): Admitting: Occupational Therapy

## 2023-09-19 DIAGNOSIS — S46801D Unspecified injury of other muscles, fascia and tendons at shoulder and upper arm level, right arm, subsequent encounter: Secondary | ICD-10-CM | POA: Diagnosis not present

## 2023-09-19 DIAGNOSIS — M25511 Pain in right shoulder: Secondary | ICD-10-CM | POA: Diagnosis not present

## 2023-09-19 DIAGNOSIS — M25611 Stiffness of right shoulder, not elsewhere classified: Secondary | ICD-10-CM | POA: Diagnosis not present

## 2023-09-19 DIAGNOSIS — R29898 Other symptoms and signs involving the musculoskeletal system: Secondary | ICD-10-CM

## 2023-09-19 NOTE — Therapy (Unsigned)
 OUTPATIENT OCCUPATIONAL THERAPY ORTHO TREATMENT NOTE  Patient Name: Debra Harris MRN: [id number] DOB:20-Aug-1950, 73 y.o., female Today's Date: 09/20/2023   END OF SESSION:  OT End of Session - 09/19/23 1432     Visit Number 3    Number of Visits 12    Date for OT Re-Evaluation 10/09/23    Authorization Type 1) Medicare A & B 2) [employer]    Authorization Time Period no auth required    Progress Note Due on Visit 10    OT Start Time 1348    OT Stop Time 1432    OT Time Calculation (min) 44 min    Activity Tolerance Patient tolerated treatment well    Behavior During Therapy WFL for tasks assessed/performed            Past Medical History:  Diagnosis Date   A-fib (HCC)    recent diagnosis 03/2023 s/p ablation and Watchman 05/30/2023   Adenomatous colon polyp    Allergic rhinitis    Arthritis    Back pain    Constipation    Diabetes mellitus    Diverticulosis    External hemorrhoids    Fibromyalgia    Gallbladder problem    GERD (gastroesophageal reflux disease)    Glaucoma    Glaucoma    Hemorrhoids    grade 3    Hip pain    IBS (irritable bowel syndrome)    IDA (iron deficiency anemia)    Iron deficiency anemia 06/05/2014   Irregular heartbeat    Leg edema    Leg numbness    Leg pain    Low tension glaucoma    Neuropathy, leg    Ovarian cyst 11/20/2000   Prediabetes    Sleep apnea    CPAP   Sliding hiatal hernia    Past Surgical History:  Procedure Laterality Date   CARPAL TUNNEL RELEASE Bilateral    CATARACT EXTRACTION     bilateral   CHOLECYSTECTOMY     COLONOSCOPY  02/28/2003   Small external hemorrhoids, single sigmoid colon diverticulum, small polyp at the hepatic flexure focally adenomatous.   COLONOSCOPY  02/28/2008   Dr. Shaaron, Versed  4/Demerol 75. Pancolonic diverticula, external hemorrhoids. Colonoscopy every 5 years for history of adenomatous polyps   COLONOSCOPY  02/28/2012   Dr. Obie: Propofol . mild diverticulosis in the  sigmoid colon, random colon biopsies benign.   COLONOSCOPY WITH PROPOFOL  N/A 09/13/2015   Procedure: COLONOSCOPY WITH PROPOFOL ;  Surgeon: Lamar CHRISTELLA Shaaron, MD;  Location: [facility];  Service: Endoscopy;  Laterality: N/A;  1030   ESOPHAGEAL DILATION N/A 10/26/2014   Procedure: ESOPHAGEAL DILATION 54 FRENCH;  Surgeon: Lamar CHRISTELLA Shaaron, MD;  Location: [facility];  Service: Endoscopy;  Laterality: N/A;   ESOPHAGOGASTRODUODENOSCOPY  02/28/2008   Dr. Shaaron, distal esophageal erosions, Agapito dilator passed. Comment small hiatal hernia, patulous EG junction.   ESOPHAGOGASTRODUODENOSCOPY (EGD) WITH PROPOFOL  N/A 10/26/2014   Dr.Rourk- distal esophageal erosions c/w mild erosive reflux esophagitis. hiatal hernia, o/w normal EGD   ESOPHAGOGASTRODUODENOSCOPY (EGD) WITH PROPOFOL  N/A 09/13/2015   Procedure: ESOPHAGOGASTRODUODENOSCOPY (EGD) WITH PROPOFOL ;  Surgeon: Lamar CHRISTELLA Shaaron, MD;  Location: [facility];  Service: Endoscopy;  Laterality: N/A;   GIVENS CAPSULE STUDY N/A 10/26/2014   couple of mucosal breaks/erosions but essentially unremarkable small bowel study   HEMORRHOID BANDING  06/2022   KNEE SURGERY     right-arthroscopy   LEFT ATRIAL APPENDAGE OCCLUSION  05/30/2023   OOPHORECTOMY  age 66s   PARTIAL  HYSTERECTOMY  age 53s   RECTAL EXAM UNDER ANESTHESIA N/A 10/26/2014   circumferential grade 3 hemoorrhoids easily reducible   TEE WITHOUT CARDIOVERSION N/A 04/27/2022   Procedure: TRANSESOPHAGEAL ECHOCARDIOGRAM (TEE);  Surgeon: Alvan Dorn FALCON, MD;  Location: [facility];  Service: Endoscopy;  Laterality: N/A;  pt knows to arrive at 8:30   thumb surger Bilateral    trigger thumb   Patient Active Problem List   Diagnosis Date Noted   Diplopia 05/24/2021   Neuropathy 05/24/2021   Bilateral leg and foot pain 05/27/2020   Bilateral sciatica 05/27/2020   Arthralgia of toe 05/27/2020   Sprain of anterior talofibular ligament of right ankle 01/30/2020   Concussion with no loss of consciousness  03/07/2019   Retrograde memory loss 03/07/2019   Vertigo due to brain injury (HCC) 03/07/2019   Nystagmus on abduction 03/07/2019   New onset of headaches 03/07/2019   Controlled type 2 diabetes mellitus with complication, without long-term current use of insulin  (HCC) 03/07/2019   Class 2 severe obesity with serious comorbidity and body mass index (BMI) of 35.0 to 35.9 in adult (HCC) 03/07/2019   Anemia 02/25/2019   Hemorrhoids 02/25/2019   History of hysterectomy 02/25/2019   Irritable bowel syndrome 02/25/2019   Closed fracture of nasal bones 07/29/2018   Obstruction of nasal valve 07/29/2018   Obesity (BMI 30-39.9) 08/22/2017   Other fatigue 07/25/2017   Shortness of breath on exertion 07/25/2017   Prediabetes 07/25/2017   Vitamin D  deficiency 07/25/2017   B12 nutritional deficiency 07/25/2017   Heart murmur 05/10/2016   Diverticulosis of colon without hemorrhage    IDA (iron deficiency anemia)    Rectal bleeding    History of melena    History of colonic polyps    Third degree hemorrhoids    Reflux esophagitis    Hiatal hernia    Dysphagia, pharyngoesophageal phase    Adenomatous colon polyp 10/16/2014   Melena 10/12/2014   Iron deficiency anemia 06/05/2014   Seasonal allergies 03/31/2014   Type 2 diabetes mellitus (HCC) 03/31/2014   Bilateral dry eyes 01/20/2014   Blepharochalasis 01/20/2014   H/O sleep apnea 01/20/2014   Pseudophakia of both eyes 01/20/2014   OSA (obstructive sleep apnea) 01/02/2011   HEMATOCHEZIA 12/28/2008   ABDOMINAL PAIN, LEFT LOWER QUADRANT 12/28/2008   History of colonic polyps 12/28/2008   GERD 12/25/2008   ARTHRITIS 12/25/2008   LOW BACK PAIN, ACUTE 12/25/2008   Dysphagia 12/25/2008   PUD, HX OF 12/25/2008    PCP: Dr. Gaither Langton  REFERRING PROVIDER: Dr. Taft Minerva  ONSET DATE: ~ 3 weeks ago  REFERRING DIAG: S46.011D (ICD-10-CM) - Traumatic complete tear of right rotator cuff, subsequent encounter   THERAPY DIAG:  Acute  pain of right shoulder  Stiffness of right shoulder, not elsewhere classified  Other symptoms and signs involving the musculoskeletal system  Rationale for Evaluation and Treatment: Rehabilitation  SUBJECTIVE:   SUBJECTIVE STATEMENT: S: I am sore today. Pt accompanied by: self  PERTINENT HISTORY: Pt is a 73 y/o female presenting with right shoulder pain and confirmed RC tear s/p fall approximately 3 weeks ago. Pt reports initially she could not move the right arm, however has been working on stretching it and moving it and can now reach forward to a degree.   PRECAUTIONS: None  WEIGHT BEARING RESTRICTIONS: No  PAIN:  Are you having pain? Yes: NPRS scale: 2/10 Pain location: anterior shoulder and medial deltoid regions Pain description: aching, sore Aggravating factors: over using it Relieving  factors: rest  FALLS: Has patient fallen in last 6 months? Yes. Number of falls 2  PLOF: Independent  PATIENT GOALS: To have less pain and more use of the RUE  NEXT MD VISIT: 09/24/23  OBJECTIVE:   HAND DOMINANCE: Right  ADLs: Overall ADLs: Pt reports difficulty with any lifting including items, cannot reach overhead or behind the back. Pt is using compensatory strategies for ADLs, is using the left hand for most tasks instead of the dominant right. Pt reports difficulty sleeping, only sleeping for ~ 3-4 hours at a time with heat and/or ice.   FUNCTIONAL OUTCOME MEASURES: Upper Extremity Functional Scale (UEFS): 11/80=14%  UPPER EXTREMITY ROM:       Assessed in sitting, er/IR adducted  Active ROM Right eval  Shoulder flexion 82  Shoulder abduction 48  Shoulder internal rotation 90  Shoulder external rotation 50  (Blank rows = not tested)    Assessed in supine, er/IR adducted  Passive ROM Right eval  Shoulder flexion 102  Shoulder abduction 74  Shoulder internal rotation 90  Shoulder external rotation 42  (Blank rows = not tested)  UPPER EXTREMITY MMT:      Assessed in sitting, er/IR adducted-assessed on observation due to pain  MMT Right eval  Shoulder flexion 3-/5  Shoulder abduction 2+/5  Shoulder internal rotation 3/5  Shoulder external rotation 3/5  (Blank rows = not tested)   SENSATION: WFL  EDEMA: None  COGNITION: Overall cognitive status: Within functional limits for tasks assessed  OBSERVATIONS: Mod fascial restrictions along right upper arm, anterior shoulder, trapezius, and scapular regions   TODAY'S TREATMENT:                                                                                                                              DATE:   09/19/23 -Manual Therapy: myofascial release and trigger point applied to biceps, trapezius, and scapular region in order to reduce fascial restrictions and pain as well as improved ROM -P/ROM: flexion, abduction, er/IR, x6 -AA/ROM: supine, flexion, abduction, protraction, horizontal abduction, er/IR, x10 -Isometrics: supine in therapist hand, flexion, abduction, extension, IR, ER, 4x10 -Scapular Strengthening: red band, extension, retraction, rows, x10  09/12/23 -Manual Therapy: -P/ROM: flexion, abduction, er/IR, x6 -AA/ROM: supine, flexion, abduction, protraction, horizontal abduction, er/IR, x10 -Isometrics: supine in therapist hand, flexion, abduction, extension, IR, ER, 4x10    PATIENT EDUCATION: Education details: Publishing rights manager Person educated: Patient Education method: Explanation, Demonstration, and Handouts Education comprehension: verbalized understanding and returned demonstration  HOME EXERCISE PROGRAM: Eval: table slides 7/16: AA/ROM and Isometrics 7/24: Scapular Strengthening  GOALS: Goals reviewed with patient? Yes   SHORT TERM GOALS: Target date: 09/18/23  Pt will be provided with and educated on HEP to improve mobility in RUE required for use during ADL completion.   Goal status: IN PROGRESS  2.  Pt will increase RUE P/ROM by 20+  degrees to improve ability to use RUE during dressing tasks with minimal compensatory techniques.  Goal status: IN PROGRESS  3.  Pt will increase RUE strength to 3+/5 to improve ability to reach for items at waist to chest height during bathing and grooming tasks.   Goal status: IN PROGRESS    LONG TERM GOALS: Target date: 10/09/23  Pt will decrease pain in RUE to 3/10 or less to improve ability to sleep for 2+ consecutive hours without waking due to pain.   Goal status: IN PROGRESS  2.  Pt will decrease RUE fascial restrictions to min amounts or less to improve mobility required for functional reaching tasks.   Goal status: IN PROGRESS  3.  Pt will increase RUE A/ROM by 25+ degrees to improve ability to use RUE when reaching overhead or behind back during dressing and bathing tasks.   Goal status: IN PROGRESS  4.  Pt will increase RUE strength to 4/5 or greater to improve ability to use RUE when lifting or carrying items during meal preparation/housework/yardwork tasks.   Goal status: IN PROGRESS   ASSESSMENT:  CLINICAL IMPRESSION: Pt reporting increased pain since previous session. She presents with maximal fascial restrictions along the biceps and anterior shoulder girdle, addressed with manual therapy. Pt continuing to work on ROM and OT added scapular strengthening for low level strengthening. Verbal and tactile cuing provided for positioning and technique.   PERFORMANCE DEFICITS: in functional skills including in functional skills including ADLs, IADLs, coordination, tone, ROM, strength, pain, fascial restrictions, muscle spasms, and UE functional use   PLAN:  OT FREQUENCY: 2x/week  OT DURATION: 6 weeks  PLANNED INTERVENTIONS: 97168 OT Re-evaluation, 97535 self care/ADL training, 02889 therapeutic exercise, 97530 therapeutic activity, 97112 neuromuscular re-education, 97140 manual therapy, 97035 ultrasound, 97014 electrical stimulation unattended, patient/family  education, and DME and/or AE instructions  RECOMMENDED OTHER SERVICES: None at this time  CONSULTED AND AGREED WITH PLAN OF CARE: Patient  PLAN FOR NEXT SESSION: Follow up on HEP, manual techniques, passive stretching, AA/ROM progressing to A/ROM if able, scapular mobility/stability work   Valentin Nightingale, OTR/L [phone] 09/20/2023, 9:26 AM

## 2023-09-19 NOTE — Patient Instructions (Signed)

## 2023-09-24 ENCOUNTER — Ambulatory Visit (INDEPENDENT_AMBULATORY_CARE_PROVIDER_SITE_OTHER): Admitting: Orthopedic Surgery

## 2023-09-24 DIAGNOSIS — S46011D Strain of muscle(s) and tendon(s) of the rotator cuff of right shoulder, subsequent encounter: Secondary | ICD-10-CM

## 2023-09-24 DIAGNOSIS — S46801D Unspecified injury of other muscles, fascia and tendons at shoulder and upper arm level, right arm, subsequent encounter: Secondary | ICD-10-CM

## 2023-09-24 NOTE — Progress Notes (Signed)
   There were no vitals taken for this visit.  There is no height or weight on file to calculate BMI.  Chief Complaint  Patient presents with   Shoulder Pain    Right     Encounter Diagnoses  Name Primary?   Traumatic injury of right subscapularis tendon, subsequent encounter Yes   Traumatic incomplete tear of right rotator cuff, subsequent encounter     DOI/DOS/ Date: 07/04/23  Improved Working with therapy

## 2023-09-24 NOTE — Progress Notes (Signed)
   Chief Complaint  Patient presents with   Shoulder Pain    Right     Encounter Diagnoses  Name Primary?   Traumatic injury of right subscapularis tendon, subsequent encounter Yes   Traumatic incomplete tear of right rotator cuff, subsequent encounter     DOI/DOS/ Date: 07/04/23  Improved Working with therapy   Debra Harris is doing much better.  She has 120 degrees of forward elevation directly forward she still has painful and weak abduction and painful external rotation however  Her pain has decreased significantly  Her range of motion has improved significantly  And her ability to elevate the arm has improved significantly  At this point recommend continued physical therapy I think we have made significant progress towards not having surgery   Return 4 weeks

## 2023-09-26 ENCOUNTER — Ambulatory Visit (HOSPITAL_COMMUNITY): Admitting: Occupational Therapy

## 2023-09-26 ENCOUNTER — Encounter (HOSPITAL_COMMUNITY): Payer: Self-pay | Admitting: Occupational Therapy

## 2023-09-26 DIAGNOSIS — E119 Type 2 diabetes mellitus without complications: Secondary | ICD-10-CM | POA: Diagnosis not present

## 2023-09-26 DIAGNOSIS — H401132 Primary open-angle glaucoma, bilateral, moderate stage: Secondary | ICD-10-CM | POA: Diagnosis not present

## 2023-09-26 DIAGNOSIS — M25511 Pain in right shoulder: Secondary | ICD-10-CM | POA: Diagnosis not present

## 2023-09-26 DIAGNOSIS — M25611 Stiffness of right shoulder, not elsewhere classified: Secondary | ICD-10-CM

## 2023-09-26 DIAGNOSIS — H43813 Vitreous degeneration, bilateral: Secondary | ICD-10-CM | POA: Diagnosis not present

## 2023-09-26 DIAGNOSIS — R29898 Other symptoms and signs involving the musculoskeletal system: Secondary | ICD-10-CM

## 2023-09-26 DIAGNOSIS — S46801D Unspecified injury of other muscles, fascia and tendons at shoulder and upper arm level, right arm, subsequent encounter: Secondary | ICD-10-CM | POA: Diagnosis not present

## 2023-09-26 DIAGNOSIS — D3131 Benign neoplasm of right choroid: Secondary | ICD-10-CM | POA: Diagnosis not present

## 2023-09-26 DIAGNOSIS — H04123 Dry eye syndrome of bilateral lacrimal glands: Secondary | ICD-10-CM | POA: Diagnosis not present

## 2023-09-26 NOTE — Therapy (Signed)
 OUTPATIENT OCCUPATIONAL THERAPY ORTHO TREATMENT NOTE  Patient Name: Debra Harris MRN: [id number] DOB:Apr 29, 1950, 73 y.o., female Today's Date: 09/26/2023   END OF SESSION:  OT End of Session - 09/26/23 1015     Visit Number 4    Number of Visits 12    Date for OT Re-Evaluation 10/09/23    Authorization Type 1) Medicare A & B 2) [employer]    Authorization Time Period no auth required    Progress Note Due on Visit 10    OT Start Time [phone]    OT Stop Time 1015    OT Time Calculation (min) 42 min    Activity Tolerance Patient tolerated treatment well    Behavior During Therapy WFL for tasks assessed/performed          Past Medical History:  Diagnosis Date   A-fib (HCC)    recent diagnosis 03/2023 s/p ablation and Watchman 05/30/2023   Adenomatous colon polyp    Allergic rhinitis    Arthritis    Back pain    Constipation    Diabetes mellitus    Diverticulosis    External hemorrhoids    Fibromyalgia    Gallbladder problem    GERD (gastroesophageal reflux disease)    Glaucoma    Glaucoma    Hemorrhoids    grade 3    Hip pain    IBS (irritable bowel syndrome)    IDA (iron deficiency anemia)    Iron deficiency anemia 06/05/2014   Irregular heartbeat    Leg edema    Leg numbness    Leg pain    Low tension glaucoma    Neuropathy, leg    Ovarian cyst 11/20/2000   Prediabetes    Sleep apnea    CPAP   Sliding hiatal hernia    Past Surgical History:  Procedure Laterality Date   CARPAL TUNNEL RELEASE Bilateral    CATARACT EXTRACTION     bilateral   CHOLECYSTECTOMY     COLONOSCOPY  02/28/2003   Small external hemorrhoids, single sigmoid colon diverticulum, small polyp at the hepatic flexure focally adenomatous.   COLONOSCOPY  02/28/2008   Dr. Shaaron, Versed  4/Demerol 75. Pancolonic diverticula, external hemorrhoids. Colonoscopy every 5 years for history of adenomatous polyps   COLONOSCOPY  02/28/2012   Dr. Obie: Propofol . mild diverticulosis in the  sigmoid colon, random colon biopsies benign.   COLONOSCOPY WITH PROPOFOL  N/A 09/13/2015   Procedure: COLONOSCOPY WITH PROPOFOL ;  Surgeon: Lamar CHRISTELLA Shaaron, MD;  Location: [facility];  Service: Endoscopy;  Laterality: N/A;  1030   ESOPHAGEAL DILATION N/A 10/26/2014   Procedure: ESOPHAGEAL DILATION 54 FRENCH;  Surgeon: Lamar CHRISTELLA Shaaron, MD;  Location: [facility];  Service: Endoscopy;  Laterality: N/A;   ESOPHAGOGASTRODUODENOSCOPY  02/28/2008   Dr. Shaaron, distal esophageal erosions, Agapito dilator passed. Comment small hiatal hernia, patulous EG junction.   ESOPHAGOGASTRODUODENOSCOPY (EGD) WITH PROPOFOL  N/A 10/26/2014   Dr.Rourk- distal esophageal erosions c/w mild erosive reflux esophagitis. hiatal hernia, o/w normal EGD   ESOPHAGOGASTRODUODENOSCOPY (EGD) WITH PROPOFOL  N/A 09/13/2015   Procedure: ESOPHAGOGASTRODUODENOSCOPY (EGD) WITH PROPOFOL ;  Surgeon: Lamar CHRISTELLA Shaaron, MD;  Location: [facility];  Service: Endoscopy;  Laterality: N/A;   GIVENS CAPSULE STUDY N/A 10/26/2014   couple of mucosal breaks/erosions but essentially unremarkable small bowel study   HEMORRHOID BANDING  06/2022   KNEE SURGERY     right-arthroscopy   LEFT ATRIAL APPENDAGE OCCLUSION  05/30/2023   OOPHORECTOMY  age 3s   PARTIAL HYSTERECTOMY  age 30s   RECTAL EXAM UNDER ANESTHESIA N/A 10/26/2014   circumferential grade 3 hemoorrhoids easily reducible   TEE WITHOUT CARDIOVERSION N/A 04/27/2022   Procedure: TRANSESOPHAGEAL ECHOCARDIOGRAM (TEE);  Surgeon: Alvan Dorn FALCON, MD;  Location: [facility];  Service: Endoscopy;  Laterality: N/A;  pt knows to arrive at 8:30   thumb surger Bilateral    trigger thumb   Patient Active Problem List   Diagnosis Date Noted   Diplopia 05/24/2021   Neuropathy 05/24/2021   Bilateral leg and foot pain 05/27/2020   Bilateral sciatica 05/27/2020   Arthralgia of toe 05/27/2020   Sprain of anterior talofibular ligament of right ankle 01/30/2020   Concussion with no loss of consciousness  03/07/2019   Retrograde memory loss 03/07/2019   Vertigo due to brain injury (HCC) 03/07/2019   Nystagmus on abduction 03/07/2019   New onset of headaches 03/07/2019   Controlled type 2 diabetes mellitus with complication, without long-term current use of insulin  (HCC) 03/07/2019   Class 2 severe obesity with serious comorbidity and body mass index (BMI) of 35.0 to 35.9 in adult (HCC) 03/07/2019   Anemia 02/25/2019   Hemorrhoids 02/25/2019   History of hysterectomy 02/25/2019   Irritable bowel syndrome 02/25/2019   Closed fracture of nasal bones 07/29/2018   Obstruction of nasal valve 07/29/2018   Obesity (BMI 30-39.9) 08/22/2017   Other fatigue 07/25/2017   Shortness of breath on exertion 07/25/2017   Prediabetes 07/25/2017   Vitamin D  deficiency 07/25/2017   B12 nutritional deficiency 07/25/2017   Heart murmur 05/10/2016   Diverticulosis of colon without hemorrhage    IDA (iron deficiency anemia)    Rectal bleeding    History of melena    History of colonic polyps    Third degree hemorrhoids    Reflux esophagitis    Hiatal hernia    Dysphagia, pharyngoesophageal phase    Adenomatous colon polyp 10/16/2014   Melena 10/12/2014   Iron deficiency anemia 06/05/2014   Seasonal allergies 03/31/2014   Type 2 diabetes mellitus (HCC) 03/31/2014   Bilateral dry eyes 01/20/2014   Blepharochalasis 01/20/2014   H/O sleep apnea 01/20/2014   Pseudophakia of both eyes 01/20/2014   OSA (obstructive sleep apnea) 01/02/2011   HEMATOCHEZIA 12/28/2008   ABDOMINAL PAIN, LEFT LOWER QUADRANT 12/28/2008   History of colonic polyps 12/28/2008   GERD 12/25/2008   ARTHRITIS 12/25/2008   LOW BACK PAIN, ACUTE 12/25/2008   Dysphagia 12/25/2008   PUD, HX OF 12/25/2008    PCP: Dr. Gaither Langton  REFERRING PROVIDER: Dr. Taft Minerva  ONSET DATE: ~ 3 weeks ago  REFERRING DIAG: S46.011D (ICD-10-CM) - Traumatic complete tear of right rotator cuff, subsequent encounter   THERAPY DIAG:  Acute  pain of right shoulder  Stiffness of right shoulder, not elsewhere classified  Other symptoms and signs involving the musculoskeletal system  Rationale for Evaluation and Treatment: Rehabilitation  SUBJECTIVE:   SUBJECTIVE STATEMENT: S: I don't want surgery and they doctor thinks I'm improving without it. Pt accompanied by: self  PERTINENT HISTORY: Pt is a 73 y/o female presenting with right shoulder pain and confirmed RC tear s/p fall approximately 3 weeks ago. Pt reports initially she could not move the right arm, however has been working on stretching it and moving it and can now reach forward to a degree.   PRECAUTIONS: None  WEIGHT BEARING RESTRICTIONS: No  PAIN:  Are you having pain? Yes: NPRS scale: 2/10 Pain location: anterior shoulder and medial deltoid regions Pain description: aching, sore  Aggravating factors: over using it Relieving factors: rest  FALLS: Has patient fallen in last 6 months? Yes. Number of falls 2  PLOF: Independent  PATIENT GOALS: To have less pain and more use of the RUE  NEXT MD VISIT: 09/24/23  OBJECTIVE:   HAND DOMINANCE: Right  ADLs: Overall ADLs: Pt reports difficulty with any lifting including items, cannot reach overhead or behind the back. Pt is using compensatory strategies for ADLs, is using the left hand for most tasks instead of the dominant right. Pt reports difficulty sleeping, only sleeping for ~ 3-4 hours at a time with heat and/or ice.   FUNCTIONAL OUTCOME MEASURES: Upper Extremity Functional Scale (UEFS): 11/80=14%  UPPER EXTREMITY ROM:       Assessed in sitting, er/IR adducted  Active ROM Right eval  Shoulder flexion 82  Shoulder abduction 48  Shoulder internal rotation 90  Shoulder external rotation 50  (Blank rows = not tested)    Assessed in supine, er/IR adducted  Passive ROM Right eval  Shoulder flexion 102  Shoulder abduction 74  Shoulder internal rotation 90  Shoulder external rotation 42  (Blank  rows = not tested)  UPPER EXTREMITY MMT:     Assessed in sitting, er/IR adducted-assessed on observation due to pain  MMT Right eval  Shoulder flexion 3-/5  Shoulder abduction 2+/5  Shoulder internal rotation 3/5  Shoulder external rotation 3/5  (Blank rows = not tested)   SENSATION: WFL  EDEMA: None  COGNITION: Overall cognitive status: Within functional limits for tasks assessed  OBSERVATIONS: Mod fascial restrictions along right upper arm, anterior shoulder, trapezius, and scapular regions   TODAY'S TREATMENT:                                                                                                                              DATE:   09/26/23 -Manual Therapy: myofascial release and trigger point applied to biceps, trapezius, and scapular region in order to reduce fascial restrictions and pain as well as improved ROM -P/ROM: flexion, abduction, er/IR, x6 -AA/ROM: supine, flexion, abduction, protraction, horizontal abduction, er/IR, x12 -A/ROM: supine, protraction, er, flexion, horizontal abduction (modified), x10 -Pulleys: flexion, abduction, x60  09/19/23 -Manual Therapy: myofascial release and trigger point applied to biceps, trapezius, and scapular region in order to reduce fascial restrictions and pain as well as improved ROM -P/ROM: flexion, abduction, er/IR, x6 -AA/ROM: supine, flexion, abduction, protraction, horizontal abduction, er/IR, x10 -Isometrics: supine in therapist hand, flexion, abduction, extension, IR, ER, 4x10 -Scapular Strengthening: red band, extension, retraction, rows, x10  09/12/23 -Manual Therapy: -P/ROM: flexion, abduction, er/IR, x6 -AA/ROM: supine, flexion, abduction, protraction, horizontal abduction, er/IR, x10 -Isometrics: supine in therapist hand, flexion, abduction, extension, IR, ER, 4x10    PATIENT EDUCATION: Education details: A/ROM Person educated: Patient Education method: Programmer, multimedia, Facilities manager, and  Handouts Education comprehension: verbalized understanding and returned demonstration  HOME EXERCISE PROGRAM: Eval: table slides 7/16: AA/ROM and Isometrics 7/24: Scapular Strengthening 7/30: A/ROM  GOALS: Goals  reviewed with patient? Yes   SHORT TERM GOALS: Target date: 09/18/23  Pt will be provided with and educated on HEP to improve mobility in RUE required for use during ADL completion.   Goal status: IN PROGRESS  2.  Pt will increase RUE P/ROM by 20+ degrees to improve ability to use RUE during dressing tasks with minimal compensatory techniques.   Goal status: IN PROGRESS  3.  Pt will increase RUE strength to 3+/5 to improve ability to reach for items at waist to chest height during bathing and grooming tasks.   Goal status: IN PROGRESS    LONG TERM GOALS: Target date: 10/09/23  Pt will decrease pain in RUE to 3/10 or less to improve ability to sleep for 2+ consecutive hours without waking due to pain.   Goal status: IN PROGRESS  2.  Pt will decrease RUE fascial restrictions to min amounts or less to improve mobility required for functional reaching tasks.   Goal status: IN PROGRESS  3.  Pt will increase RUE A/ROM by 25+ degrees to improve ability to use RUE when reaching overhead or behind back during dressing and bathing tasks.   Goal status: IN PROGRESS  4.  Pt will increase RUE strength to 4/5 or greater to improve ability to use RUE when lifting or carrying items during meal preparation/housework/yardwork tasks.   Goal status: IN PROGRESS   ASSESSMENT:  CLINICAL IMPRESSION: Overall pt having maintained pain levels with increased ROM. She is able to tolerate 80% of full ROM with AA/ROM and approximately 65% of full A/ROM. She continues to have limited abduction with severe pain. OT providing verbal and tactile cuing for positioning and technique.   PERFORMANCE DEFICITS: in functional skills including in functional skills including ADLs, IADLs,  coordination, tone, ROM, strength, pain, fascial restrictions, muscle spasms, and UE functional use   PLAN:  OT FREQUENCY: 2x/week  OT DURATION: 6 weeks  PLANNED INTERVENTIONS: 97168 OT Re-evaluation, 97535 self care/ADL training, 02889 therapeutic exercise, 97530 therapeutic activity, 97112 neuromuscular re-education, 97140 manual therapy, 97035 ultrasound, 97014 electrical stimulation unattended, patient/family education, and DME and/or AE instructions  RECOMMENDED OTHER SERVICES: None at this time  CONSULTED AND AGREED WITH PLAN OF CARE: Patient  PLAN FOR NEXT SESSION: Follow up on HEP, manual techniques, passive stretching, AA/ROM progressing to A/ROM if able, scapular mobility/stability work   Valentin Nightingale, OTR/L [phone] 09/26/2023, 12:43 PM

## 2023-10-10 ENCOUNTER — Inpatient Hospital Stay: Attending: Physician Assistant

## 2023-10-10 DIAGNOSIS — D509 Iron deficiency anemia, unspecified: Secondary | ICD-10-CM | POA: Insufficient documentation

## 2023-10-10 DIAGNOSIS — D500 Iron deficiency anemia secondary to blood loss (chronic): Secondary | ICD-10-CM

## 2023-10-10 LAB — CBC WITH DIFFERENTIAL/PLATELET
Abs Granulocyte: 3.9 K/uL (ref 1.5–6.5)
Abs Immature Granulocytes: 0.03 K/uL (ref 0.00–0.07)
Basophils Absolute: 0.1 K/uL (ref 0.0–0.1)
Basophils Relative: 1 %
Eosinophils Absolute: 0.2 K/uL (ref 0.0–0.5)
Eosinophils Relative: 4 %
HCT: 40.2 % (ref 36.0–46.0)
Hemoglobin: 12.5 g/dL (ref 12.0–15.0)
Immature Granulocytes: 1 %
Lymphocytes Relative: 21 %
Lymphs Abs: 1.2 K/uL (ref 0.7–4.0)
MCH: 26.7 pg (ref 26.0–34.0)
MCHC: 31.1 g/dL (ref 30.0–36.0)
MCV: 85.9 fL (ref 80.0–100.0)
Monocytes Absolute: 0.4 K/uL (ref 0.1–1.0)
Monocytes Relative: 7 %
Neutro Abs: 3.9 K/uL (ref 1.7–7.7)
Neutrophils Relative %: 66 %
Platelets: 176 K/uL (ref 150–400)
RBC: 4.68 MIL/uL (ref 3.87–5.11)
RDW: 18 % — ABNORMAL HIGH (ref 11.5–15.5)
Smear Review: NORMAL
WBC: 5.8 K/uL (ref 4.0–10.5)
nRBC: 0 % (ref 0.0–0.2)

## 2023-10-10 LAB — FERRITIN: Ferritin: 29 ng/mL (ref 11–307)

## 2023-10-10 LAB — IRON AND TIBC
Iron: 60 ug/dL (ref 28–170)
Saturation Ratios: 13 % (ref 10.4–31.8)
TIBC: 458 ug/dL — ABNORMAL HIGH (ref 250–450)
UIBC: 398 ug/dL

## 2023-10-12 ENCOUNTER — Telehealth: Payer: Self-pay | Admitting: Orthopedic Surgery

## 2023-10-12 NOTE — Telephone Encounter (Signed)
 Dr. Areatha pt - pt lvm requesting a refill for Hydrocodone  5-325, 30 tablets, every 6 hours PRN for moderate pain and Cyclobenzaprine  5mg , 90 tablets, 3 times daily PRN for muscle spasms to be sent to [facility] on [address].

## 2023-10-15 ENCOUNTER — Other Ambulatory Visit: Payer: Self-pay | Admitting: Orthopedic Surgery

## 2023-10-15 DIAGNOSIS — G8929 Other chronic pain: Secondary | ICD-10-CM

## 2023-10-15 MED ORDER — HYDROCODONE-ACETAMINOPHEN 5-325 MG PO TABS: 1.0000 | ORAL_TABLET | Freq: Four times a day (QID) | ORAL | 0 refills | Status: DC | PRN

## 2023-10-16 ENCOUNTER — Ambulatory Visit: Payer: Self-pay | Admitting: Physician Assistant

## 2023-10-18 ENCOUNTER — Inpatient Hospital Stay

## 2023-10-18 VITALS — BP 138/63 | HR 69 | Temp 97.6°F | Resp 16

## 2023-10-18 DIAGNOSIS — D509 Iron deficiency anemia, unspecified: Secondary | ICD-10-CM | POA: Diagnosis not present

## 2023-10-18 DIAGNOSIS — K625 Hemorrhage of anus and rectum: Secondary | ICD-10-CM

## 2023-10-18 DIAGNOSIS — D500 Iron deficiency anemia secondary to blood loss (chronic): Secondary | ICD-10-CM

## 2023-10-18 MED ORDER — SODIUM CHLORIDE 0.9 % IV SOLN
510.0000 mg | Freq: Once | INTRAVENOUS | Status: AC
  Administered 2023-10-18: 510 mg via INTRAVENOUS
  Filled 2023-10-18: qty 510

## 2023-10-18 MED ORDER — SODIUM CHLORIDE 0.9 % IV SOLN
Freq: Once | INTRAVENOUS | Status: AC
Start: 2023-10-18 — End: 2023-10-18

## 2023-10-18 NOTE — Progress Notes (Signed)
 Patient presents today for Feraheme  infusion. Patient refused the pre-medications, Tylenol  650 mg PO and Cetirizine  10 mg IV.    Feraheme  given today per MD orders. Tolerated infusion without adverse affects. Vital signs stable. No complaints at this time. Discharged from clinic ambulatory in stable condition. Alert and oriented x 3. F/U with [facility] as scheduled.

## 2023-10-18 NOTE — Patient Instructions (Signed)
 [facility] [place] - A DEPT OF [facility]  Discharge Instructions: Thank you for choosing [facility] to provide your oncology and hematology care.  If you have a lab appointment with the Cancer Center - please note that after April 8th, 2024, all labs will be drawn in the cancer center.  You do not have to check in or register with the main entrance as you have in the past but will complete your check-in in the cancer center.  Wear comfortable clothing and clothing appropriate for easy access to any Portacath or PICC line.   We strive to give you quality time with your provider. You may need to reschedule your appointment if you arrive late (15 or more minutes).  Arriving late affects you and other patients whose appointments are after yours.  Also, if you miss three or more appointments without notifying the office, you may be dismissed from the clinic at the provider's discretion.      For prescription refill requests, have your pharmacy contact our office and allow 72 hours for refills to be completed.    Today you received the following chemotherapy and/or immunotherapy agents Feraheme. Ferumoxytol Injection What is this medication? FERUMOXYTOL (FER ue MOX i tol) treats low levels of iron in your body (iron deficiency anemia). Iron is a mineral that plays an important role in making red blood cells, which carry oxygen from your lungs to the rest of your body. This medicine may be used for other purposes; ask your health care provider or pharmacist if you have questions. COMMON BRAND NAME(S): Feraheme What should I tell my care team before I take this medication? They need to know if you have any of these conditions: Anemia not caused by low iron levels High levels of iron in the blood Magnetic resonance imaging (MRI) test scheduled An unusual or allergic reaction to iron, other medications, foods, dyes, or preservatives Pregnant or trying to get  pregnant Breastfeeding How should I use this medication? This medication is injected into a vein. It is given by your care team in a hospital or clinic setting. Talk to your care team the use of this medication in children. Special care may be needed. Overdosage: If you think you have taken too much of this medicine contact a poison control center or emergency room at once. NOTE: This medicine is only for you. Do not share this medicine with others. What if I miss a dose? It is important not to miss your dose. Call your care team if you are unable to keep an appointment. What may interact with this medication? Other iron products This list may not describe all possible interactions. Give your health care provider a list of all the medicines, herbs, non-prescription drugs, or dietary supplements you use. Also tell them if you smoke, drink alcohol, or use illegal drugs. Some items may interact with your medicine. What should I watch for while using this medication? Visit your care team for regular checks on your progress. Tell your care team if your symptoms do not start to get better or if they get worse. You may need blood work done while you are taking this medication. You may need to eat more foods that contain iron. Talk to your care team. Foods that contain iron include whole grains or cereals, dried fruits, beans, peas, leafy green vegetables, and organ meats (liver, kidney). What side effects may I notice from receiving this medication? Side effects that  you should report to your care team as soon as possible: Allergic reactions--skin rash, itching, hives, swelling of the face, lips, tongue, or throat Low blood pressure--dizziness, feeling faint or lightheaded, blurry vision Shortness of breath Side effects that usually do not require medical attention (report to your care team if they continue or are bothersome): Flushing Headache Joint pain Muscle pain Nausea Pain, redness, or  irritation at injection site This list may not describe all possible side effects. Call your doctor for medical advice about side effects. You may report side effects to FDA at 1-800-FDA-1088. Where should I keep my medication? This medication is given in a hospital or clinic. It will not be stored at home. NOTE: This sheet is a summary. It may not cover all possible information. If you have questions about this medicine, talk to your doctor, pharmacist, or health care provider.  2024 Elsevier/Gold Standard (2022-10-04 00:00:00)      To help prevent nausea and vomiting after your treatment, we encourage you to take your nausea medication as directed.  BELOW ARE SYMPTOMS THAT SHOULD BE REPORTED IMMEDIATELY: *FEVER GREATER THAN 100.4 F (38 C) OR HIGHER *CHILLS OR SWEATING *NAUSEA AND VOMITING THAT IS NOT CONTROLLED WITH YOUR NAUSEA MEDICATION *UNUSUAL SHORTNESS OF BREATH *UNUSUAL BRUISING OR BLEEDING *URINARY PROBLEMS (pain or burning when urinating, or frequent urination) *BOWEL PROBLEMS (unusual diarrhea, constipation, pain near the anus) TENDERNESS IN MOUTH AND THROAT WITH OR WITHOUT PRESENCE OF ULCERS (sore throat, sores in mouth, or a toothache) UNUSUAL RASH, SWELLING OR PAIN  UNUSUAL VAGINAL DISCHARGE OR ITCHING   Items with * indicate a potential emergency and should be followed up as soon as possible or go to the Emergency Department if any problems should occur.  Please show the CHEMOTHERAPY ALERT CARD or IMMUNOTHERAPY ALERT CARD at check-in to the Emergency Department and triage nurse.  Should you have questions after your visit or need to cancel or reschedule your appointment, please contact [facility] [place] - A DEPT OF [facility] [phone]  and follow the prompts.  Office hours are 8:00 a.m. to 4:30 p.m. Monday - Friday. Please note that voicemails left after 4:00 p.m. may not be returned until the following business day.  We are closed weekends  and major holidays. You have access to a nurse at all times for urgent questions. Please call the main number to the clinic [phone] and follow the prompts.  For any non-urgent questions, you may also contact your provider using MyChart. We now offer e-Visits for anyone 67 and older to request care online for non-urgent symptoms. For details visit [email or web address].   Also download the MyChart app! Go to the app store, search "MyChart", open the app, select [place], and log in with your MyChart username and password.

## 2023-10-22 ENCOUNTER — Ambulatory Visit (INDEPENDENT_AMBULATORY_CARE_PROVIDER_SITE_OTHER): Admitting: Orthopedic Surgery

## 2023-10-22 DIAGNOSIS — G8929 Other chronic pain: Secondary | ICD-10-CM | POA: Diagnosis not present

## 2023-10-22 DIAGNOSIS — S46801D Unspecified injury of other muscles, fascia and tendons at shoulder and upper arm level, right arm, subsequent encounter: Secondary | ICD-10-CM

## 2023-10-22 DIAGNOSIS — S46011D Strain of muscle(s) and tendon(s) of the rotator cuff of right shoulder, subsequent encounter: Secondary | ICD-10-CM | POA: Diagnosis not present

## 2023-10-22 DIAGNOSIS — M25561 Pain in right knee: Secondary | ICD-10-CM

## 2023-10-22 MED ORDER — HYDROCODONE-ACETAMINOPHEN 5-325 MG PO TABS: 1.0000 | ORAL_TABLET | Freq: Four times a day (QID) | ORAL | 0 refills | Status: DC | PRN

## 2023-10-22 NOTE — Progress Notes (Signed)
    Chief Complaint  Patient presents with   Follow-up    Recheck on right shoulder.    Encounter Diagnoses  Name Primary?   Traumatic injury of right subscapularis tendon, subsequent encounter Yes   Traumatic incomplete tear of right rotator cuff, subsequent encounter    Chronic pain of right knee     DOI/DOS/ Date: 07/04/23  Improved  Debra Harris continues to improve  There are certain things she can do in certain things that bother her but overall her pain is manageable with an occasional hydrocodone   Her range of motion has improved  She does alter her mechanics to get her arm over her head but does it well  Recommend continued therapy and exercises at home which is what she has been doing and come back in 3 to 4 months  Meds ordered this encounter  Medications   HYDROcodone -acetaminophen  (NORCO/VICODIN) 5-325 MG tablet    Sig: Take 1 tablet by mouth every 6 (six) hours as needed for moderate pain (pain score 4-6).    Dispense:  30 tablet    Refill:  0

## 2023-10-22 NOTE — Progress Notes (Signed)
   There were no vitals taken for this visit.  There is no height or weight on file to calculate BMI.  Chief Complaint  Patient presents with   Follow-up    Recheck on right shoulder.    No diagnosis found.  DOI/DOS/ Date: 07/04/23  Improved

## 2023-11-30 ENCOUNTER — Ambulatory Visit: Admitting: Student

## 2023-12-07 DIAGNOSIS — I480 Paroxysmal atrial fibrillation: Secondary | ICD-10-CM | POA: Diagnosis not present

## 2023-12-13 ENCOUNTER — Other Ambulatory Visit: Payer: Self-pay

## 2023-12-13 ENCOUNTER — Encounter: Payer: Self-pay | Admitting: Orthopedic Surgery

## 2023-12-13 ENCOUNTER — Ambulatory Visit (INDEPENDENT_AMBULATORY_CARE_PROVIDER_SITE_OTHER): Admitting: Orthopedic Surgery

## 2023-12-13 VITALS — BP 145/85 | HR 70 | Ht 64.0 in | Wt 207.0 lb

## 2023-12-13 DIAGNOSIS — M546 Pain in thoracic spine: Secondary | ICD-10-CM

## 2023-12-13 DIAGNOSIS — W19XXXA Unspecified fall, initial encounter: Secondary | ICD-10-CM

## 2023-12-13 DIAGNOSIS — G8929 Other chronic pain: Secondary | ICD-10-CM

## 2023-12-13 MED ORDER — HYDROCODONE-ACETAMINOPHEN 5-325 MG PO TABS: 1.0000 | ORAL_TABLET | Freq: Four times a day (QID) | ORAL | 0 refills | Status: AC | PRN

## 2023-12-13 MED ORDER — CYCLOBENZAPRINE HCL 10 MG PO TABS: 10.0000 mg | ORAL_TABLET | Freq: Two times a day (BID) | ORAL | 1 refills | Status: AC | PRN

## 2023-12-13 NOTE — Progress Notes (Signed)
 Subjective:     Patient ID: Debra Harris, female   DOB: [birth year], 72 y.o.   MRN: [id number]  Chief Complaint  Patient presents with   Back Pain    Clemens yesterday upper back painful / fell backwards     73 year old female acute onset of midthoracic back pain yesterday after falling backwards.  The patient slipped while cleaning a door fell onto her upper back and complains of pain in the mid thoracic region.  She denies any numbness or tingling  Back Pain The pain is moderate. The symptoms are aggravated by bending, coughing and position. Pertinent negatives include no abdominal pain, bladder incontinence, bowel incontinence, chest pain, dysuria, fever, headaches, leg pain, numbness, paresis or paresthesias. She has tried analgesics for the symptoms.     Review of Systems  Constitutional:  Negative for fever.  Cardiovascular:  Negative for chest pain.  Gastrointestinal:  Negative for abdominal pain and bowel incontinence.  Genitourinary:  Negative for bladder incontinence and dysuria.  Musculoskeletal:  Positive for back pain.  Neurological:  Negative for numbness, headaches and paresthesias.       Objective:   Physical Exam Vitals and nursing note reviewed.  Constitutional:      Appearance: Normal appearance.  HENT:     Head: Normocephalic and atraumatic.  Eyes:     General: No scleral icterus.       Right eye: No discharge.        Left eye: No discharge.     Extraocular Movements: Extraocular movements intact.     Conjunctiva/sclera: Conjunctivae normal.     Pupils: Pupils are equal, round, and reactive to light.  Cardiovascular:     Rate and Rhythm: Normal rate.     Pulses: Normal pulses.  Skin:    General: Skin is warm and dry.     Capillary Refill: Capillary refill takes less than 2 seconds.  Neurological:     General: No focal deficit present.     Mental Status: She is alert and oriented to person, place, and time.  Psychiatric:        Mood and Affect:  Mood normal.        Behavior: Behavior normal.        Thought Content: Thought content normal.        Judgment: Judgment normal.    Tenderness is noted between the scapula on the spinous processes She has pain with flexion and getting up and out of a chair No numbness or tingling in the upper extremities  No thoracic numbness or paresthesias    Assessment:     DG Thoracic Spine 2 View Result Date: 12/13/2023 Acute pain thoracic spine secondary to fall X-rays show abnormal coronal plane alignment with a tilt towards the left multiple areas of endplate degeneration but no signs of compression fracture.  Patient does have a fair amount of kyphosis Impression kyphosis of the thoracic spine without acute fracture multiple degenerative changes throughout the thoracic spine       Plan:     Meds ordered this encounter  Medications   cyclobenzaprine  (FLEXERIL ) 10 MG tablet    Sig: Take 1 tablet (10 mg total) by mouth 2 (two) times daily as needed for muscle spasms.    Dispense:  40 tablet    Refill:  1   HYDROcodone -acetaminophen  (NORCO/VICODIN) 5-325 MG tablet    Sig: Take 1 tablet by mouth every 6 (six) hours as needed for moderate pain (pain score 4-6).    Dispense:  30 tablet    Refill:  0    Usual treatment with rest activity modification medication should resolve over time 2 to 4 weeks

## 2023-12-13 NOTE — Progress Notes (Signed)
  Intake history:  Chief Complaint  Patient presents with   Back Pain    Fell yesterday      Ht 5' 4 (1.626 m)   Wt 207 lb (93.9 kg)   BMI 35.53 kg/m  Body mass index is 35.53 kg/m.  Pharmacy? [facility]___  WHAT ARE WE SEEING YOU FOR TODAY?   Upper back  How long has this bothered you? (DOI?DOS?WS?)  yesterday  Was there an injury? Yes  Anticoag.  Yes  Diabetes Yes   Heart disease Yes  Hypertension Yes  SMOKING HX No  Kidney disease No  Any ALLERGIES __________ Allergies  Allergen Reactions   Injectafer  [Ferric Carboxymaltose ] Other (See Comments)    Felt faint woozy Has tolerated feraheme  without problems   Tizanidine  Swelling    Tongue swelling     ____________________________________   Treatment:  Have you taken:  Tylenol  Yes  Advil No  Had PT No  Had injection No  Other  _________________________

## 2023-12-21 DIAGNOSIS — D509 Iron deficiency anemia, unspecified: Secondary | ICD-10-CM | POA: Diagnosis not present

## 2023-12-21 DIAGNOSIS — F329 Major depressive disorder, single episode, unspecified: Secondary | ICD-10-CM | POA: Diagnosis not present

## 2023-12-21 DIAGNOSIS — K648 Other hemorrhoids: Secondary | ICD-10-CM | POA: Diagnosis not present

## 2023-12-21 DIAGNOSIS — Z79899 Other long term (current) drug therapy: Secondary | ICD-10-CM | POA: Diagnosis not present

## 2023-12-21 DIAGNOSIS — E1140 Type 2 diabetes mellitus with diabetic neuropathy, unspecified: Secondary | ICD-10-CM | POA: Diagnosis not present

## 2023-12-21 DIAGNOSIS — E785 Hyperlipidemia, unspecified: Secondary | ICD-10-CM | POA: Diagnosis not present

## 2023-12-28 DIAGNOSIS — I480 Paroxysmal atrial fibrillation: Secondary | ICD-10-CM | POA: Diagnosis not present

## 2023-12-28 DIAGNOSIS — E785 Hyperlipidemia, unspecified: Secondary | ICD-10-CM | POA: Diagnosis not present

## 2023-12-28 DIAGNOSIS — M791 Myalgia, unspecified site: Secondary | ICD-10-CM | POA: Diagnosis not present

## 2023-12-28 DIAGNOSIS — Z23 Encounter for immunization: Secondary | ICD-10-CM | POA: Diagnosis not present

## 2023-12-28 DIAGNOSIS — E1140 Type 2 diabetes mellitus with diabetic neuropathy, unspecified: Secondary | ICD-10-CM | POA: Diagnosis not present

## 2024-01-01 DIAGNOSIS — Z23 Encounter for immunization: Secondary | ICD-10-CM | POA: Diagnosis not present

## 2024-01-02 ENCOUNTER — Inpatient Hospital Stay: Attending: Physician Assistant

## 2024-01-02 DIAGNOSIS — K909 Intestinal malabsorption, unspecified: Secondary | ICD-10-CM | POA: Diagnosis not present

## 2024-01-02 DIAGNOSIS — K922 Gastrointestinal hemorrhage, unspecified: Secondary | ICD-10-CM | POA: Insufficient documentation

## 2024-01-02 DIAGNOSIS — D509 Iron deficiency anemia, unspecified: Secondary | ICD-10-CM | POA: Diagnosis not present

## 2024-01-02 DIAGNOSIS — E559 Vitamin D deficiency, unspecified: Secondary | ICD-10-CM | POA: Insufficient documentation

## 2024-01-02 DIAGNOSIS — Z7982 Long term (current) use of aspirin: Secondary | ICD-10-CM | POA: Insufficient documentation

## 2024-01-02 DIAGNOSIS — D500 Iron deficiency anemia secondary to blood loss (chronic): Secondary | ICD-10-CM

## 2024-01-02 LAB — CBC WITH DIFFERENTIAL/PLATELET
Abs Immature Granulocytes: 0.03 K/uL (ref 0.00–0.07)
Basophils Absolute: 0.1 K/uL (ref 0.0–0.1)
Basophils Relative: 1 %
Eosinophils Absolute: 0.2 K/uL (ref 0.0–0.5)
Eosinophils Relative: 2 %
HCT: 35.5 % — ABNORMAL LOW (ref 36.0–46.0)
Hemoglobin: 11.1 g/dL — ABNORMAL LOW (ref 12.0–15.0)
Immature Granulocytes: 0 %
Lymphocytes Relative: 13 %
Lymphs Abs: 0.9 K/uL (ref 0.7–4.0)
MCH: 27.5 pg (ref 26.0–34.0)
MCHC: 31.3 g/dL (ref 30.0–36.0)
MCV: 88.1 fL (ref 80.0–100.0)
Monocytes Absolute: 0.5 K/uL (ref 0.1–1.0)
Monocytes Relative: 7 %
Neutro Abs: 5.3 K/uL (ref 1.7–7.7)
Neutrophils Relative %: 77 %
Platelets: 254 K/uL (ref 150–400)
RBC: 4.03 MIL/uL (ref 3.87–5.11)
RDW: 14.2 % (ref 11.5–15.5)
WBC: 7 K/uL (ref 4.0–10.5)
nRBC: 0 % (ref 0.0–0.2)

## 2024-01-02 LAB — FERRITIN: Ferritin: 30 ng/mL (ref 11–307)

## 2024-01-02 LAB — IRON AND TIBC
Iron: 56 ug/dL (ref 28–170)
Saturation Ratios: 13 % (ref 10.4–31.8)
TIBC: 438 ug/dL (ref 250–450)
UIBC: 383 ug/dL

## 2024-01-02 LAB — VITAMIN D 25 HYDROXY (VIT D DEFICIENCY, FRACTURES): Vit D, 25-Hydroxy: 24.25 ng/mL — ABNORMAL LOW (ref 30–100)

## 2024-01-08 NOTE — Progress Notes (Unsigned)
 VIRTUAL VISIT via TELEPHONE NOTE [facility]   I connected with Debra Harris  on 01/09/24 at  9:45 AM by telephone and verified that I am speaking with the correct person using two identifiers.  Location: Patient: Home Provider: [facility]   I discussed the limitations, risks, security and privacy concerns of performing an evaluation and management service by telephone and the availability of in person appointments. I also discussed with the patient that there may be a patient responsible charge related to this service. The patient expressed understanding and agreed to proceed.  REASON FOR VISIT: Iron deficiency anemia  CURRENT THERAPY: Intermittent IV iron  INTERVAL HISTORY:  Debra Harris is contacted today for follow-up of her iron deficiency anemia.   She was last evaluated via office visit by Pleasant Barefoot PA-C on 07/12/2023. Most recent IV iron with Feraheme  x 1 on 10/18/2023.  At today's visit, she reports feeling fairly well.   She has 80% energy and 100% appetite.  She endorses that she is maintaining a Harris weight.  In the interim since last visit, she was able to stop Eliquis (which she had remained on for 3 to 6 months following Watchman device placement on 05/30/2023).  She is now taking aspirin  81 mg daily. After Eliquis was stopped, she was able to have rectal banding done at [facility] on 12/21/2023. Although she continued to have significant rectal bleeding after Eliquis was stopped, she has not had any recurrent rectal bleeding after her rectal banding. Energy had improved after IV iron, but in light of her ongoing bleeding she had some recurrent fatigue after about a month. She reports baseline dyspnea on exertion and headaches. No pica, chest pain, lightheadedness, or syncope. She has not been taking her vitamin D  supplement regularly.  ASSESSMENT & PLAN:  1.  Iron deficiency anemia: - Iron deficiency state from combination  of malabsorption and chronic GI blood loss. - Colonoscopy on 09/13/2015 showing diverticuli in the sigmoid colon, descending colon and transverse colon, internal and external hemorrhoids banded.  - Last received IV iron with Feraheme  x1 in August 2025.  She request longer administration time (1 hour) for IV Feraheme . - Intermittent issues with rectal bleeding from hemorrhoids.  Last hemorrhoid banding in October 2025 at [facility] - Previously on Eliquis for A-fib, was able to stop this in 2025 after Watchman device placement - No recurrent rectal bleeding after most recent hemorrhoid banding (October 2025) - Symptomatic with fatigue and dyspnea on exertion - Patient stopped taking her ferrous bisglycinate due to constipation that was causing worsening of her rectal bleeding - Most recent labs (01/02/2024): Hgb 11.1/MCV 88.1, ferritin 30, iron saturation 30%. - PLAN: Recommend IV Feraheme  x 2.  (Longer administration time requested by patient, to be completed over 1 hour, 2 weeks apart) - Patient instructed to call us  if she feels that she needs lab check or IV iron before next visit. - Otherwise, we will repeat CBC with iron panel and PHONE visit in 6 months   2.  Vitamin D  deficiency: - Vitamin D  level was 19.96 (January 2022), and she has been taking vitamin D  2000 units daily since that time - increased to vitamin D  4000 units in November 2023, but she has not been taking this lately. -- Most recent Vitamin D  (01/02/2024): Low at 24.45  - PLAN: Patient instructed to RESTART her vitamin D  4000 units daily.  Will recheck vitamin D  at follow-up in 6 months.  PLAN SUMMARY: >> IV Feraheme  x 2 - TWO weeks apart (extended appointment) >> Labs in 6 months = CBC/D, ferritin, iron/TIBC, vitamin D  >> OFFICE visit in 6 months (after labs)  ** Last office visit on 07/11/2023     REVIEW OF SYSTEMS:   Review of Systems  Constitutional:  Positive for malaise/fatigue. Negative for chills, diaphoresis, fever  and weight loss.  Respiratory:  Positive for cough and shortness of breath.   Cardiovascular:  Negative for chest pain and palpitations.  Gastrointestinal:  Negative for abdominal pain, blood in stool, melena, nausea and vomiting.  Musculoskeletal:  Positive for joint pain.  Neurological:  Positive for tingling and headaches. Negative for dizziness.     PHYSICAL EXAM: (per limitations of virtual telephone visit)  The patient is alert and oriented x 3, exhibiting adequate mentation, good mood, and ability to speak in full sentences and execute sound judgement.  WRAP UP:   I discussed the assessment and treatment plan with the patient. The patient was provided an opportunity to ask questions and all were answered. The patient agreed with the plan and demonstrated an understanding of the instructions.   The patient was advised to call back or seek an in-person evaluation if the symptoms worsen or if the condition fails to improve as anticipated.  I provided 22 minutes of non-face-to-face time during this encounter, including >10 minutes of medical discussion.  Pleasant CHRISTELLA Barefoot, PA-C 01/09/24 10:07 AM

## 2024-01-09 ENCOUNTER — Inpatient Hospital Stay: Admitting: Physician Assistant

## 2024-01-09 DIAGNOSIS — D500 Iron deficiency anemia secondary to blood loss (chronic): Secondary | ICD-10-CM

## 2024-01-09 DIAGNOSIS — E559 Vitamin D deficiency, unspecified: Secondary | ICD-10-CM

## 2024-01-14 ENCOUNTER — Inpatient Hospital Stay

## 2024-01-14 VITALS — BP 111/64 | HR 57 | Temp 97.0°F | Resp 20

## 2024-01-14 DIAGNOSIS — Z7982 Long term (current) use of aspirin: Secondary | ICD-10-CM | POA: Diagnosis not present

## 2024-01-14 DIAGNOSIS — E559 Vitamin D deficiency, unspecified: Secondary | ICD-10-CM | POA: Diagnosis not present

## 2024-01-14 DIAGNOSIS — K909 Intestinal malabsorption, unspecified: Secondary | ICD-10-CM | POA: Diagnosis not present

## 2024-01-14 DIAGNOSIS — D509 Iron deficiency anemia, unspecified: Secondary | ICD-10-CM | POA: Diagnosis not present

## 2024-01-14 DIAGNOSIS — K625 Hemorrhage of anus and rectum: Secondary | ICD-10-CM

## 2024-01-14 DIAGNOSIS — K922 Gastrointestinal hemorrhage, unspecified: Secondary | ICD-10-CM | POA: Diagnosis not present

## 2024-01-14 DIAGNOSIS — D500 Iron deficiency anemia secondary to blood loss (chronic): Secondary | ICD-10-CM

## 2024-01-14 MED ORDER — SODIUM CHLORIDE 0.9 % IV SOLN
510.0000 mg | Freq: Once | INTRAVENOUS | Status: AC
  Administered 2024-01-14: 510 mg via INTRAVENOUS
  Filled 2024-01-14 (×2): qty 17

## 2024-01-14 MED ORDER — SODIUM CHLORIDE 0.9 % IV SOLN: Freq: Once | INTRAVENOUS | Status: AC

## 2024-01-14 NOTE — Progress Notes (Signed)
 Patient presents today for iron infusion of Feraheme .  Patient is in satisfactory condition with no new complaints voiced.  Vital signs are stable. Patient reports taking premeds prior to arrival. We will proceed with infusion per provider orders.    Patient tolerated treatment well with no complaints voiced.  Patient left ambulatory in stable condition.  Vital signs stable at discharge.  Follow up as scheduled.

## 2024-01-14 NOTE — Patient Instructions (Signed)
 [facility] - A DEPT OF [place]  Discharge Instructions: Thank you for choosing [facility] to provide your oncology and hematology care.  If you have a lab appointment with the Cancer Center - please note that after April 8th, 2024, all labs will be drawn in the cancer center.  You do not have to check in or register with the main entrance as you have in the past but will complete your check-in in the cancer center.  Wear comfortable clothing and clothing appropriate for easy access to any Portacath or PICC line.   We strive to give you quality time with your provider. You may need to reschedule your appointment if you arrive late (15 or more minutes).  Arriving late affects you and other patients whose appointments are after yours.  Also, if you miss three or more appointments without notifying the office, you may be dismissed from the clinic at the provider's discretion.      For prescription refill requests, have your pharmacy contact our office and allow 72 hours for refills to be completed.    Today you received the following Ferahame infusion.  Ferumoxytol  Injection What is this medication? FERUMOXYTOL  (FER ue MOX i tol) treats low levels of iron in your body (iron deficiency anemia). Iron is a mineral that plays an important role in making red blood cells, which carry oxygen from your lungs to the rest of your body. This medicine may be used for other purposes; ask your health care provider or pharmacist if you have questions. COMMON BRAND NAME(S): Feraheme  What should I tell my care team before I take this medication? They need to know if you have any of these conditions: Anemia not caused by low iron levels High levels of iron in the blood Magnetic resonance imaging (MRI) test scheduled An unusual or allergic reaction to iron, other medications, foods, dyes, or preservatives Pregnant or trying to get pregnant Breastfeeding How  should I use this medication? This medication is injected into a vein. It is given by your care team in a hospital or clinic setting. Talk to your care team the use of this medication in children. Special care may be needed. Overdosage: If you think you have taken too much of this medicine contact a poison control center or emergency room at once. NOTE: This medicine is only for you. Do not share this medicine with others. What if I miss a dose? It is important not to miss your dose. Call your care team if you are unable to keep an appointment. What may interact with this medication? Other iron products This list may not describe all possible interactions. Give your health care provider a list of all the medicines, herbs, non-prescription drugs, or dietary supplements you use. Also tell them if you smoke, drink alcohol, or use illegal drugs. Some items may interact with your medicine. What should I watch for while using this medication? Visit your care team for regular checks on your progress. Tell your care team if your symptoms do not start to get better or if they get worse. You may need blood work done while you are taking this medication. You may need to eat more foods that contain iron. Talk to your care team. Foods that contain iron include whole grains or cereals, dried fruits, beans, peas, leafy green vegetables, and organ meats (liver, kidney). What side effects may I notice from receiving this medication? Side effects that you should  report to your care team as soon as possible: Allergic reactions--skin rash, itching, hives, swelling of the face, lips, tongue, or throat Low blood pressure--dizziness, feeling faint or lightheaded, blurry vision Shortness of breath Side effects that usually do not require medical attention (report to your care team if they continue or are bothersome): Flushing Headache Joint pain Muscle pain Nausea Pain, redness, or irritation at injection  site This list may not describe all possible side effects. Call your doctor for medical advice about side effects. You may report side effects to FDA at 1-800-FDA-1088. Where should I keep my medication? This medication is given in a hospital or clinic. It will not be stored at home. NOTE: This sheet is a summary. It may not cover all possible information. If you have questions about this medicine, talk to your doctor, pharmacist, or health care provider.  2024 Elsevier/Gold Standard (2022-10-04 00:00:00)   To help prevent nausea and vomiting after your treatment, we encourage you to take your nausea medication as directed.  BELOW ARE SYMPTOMS THAT SHOULD BE REPORTED IMMEDIATELY: *FEVER GREATER THAN 100.4 F (38 C) OR HIGHER *CHILLS OR SWEATING *NAUSEA AND VOMITING THAT IS NOT CONTROLLED WITH YOUR NAUSEA MEDICATION *UNUSUAL SHORTNESS OF BREATH *UNUSUAL BRUISING OR BLEEDING *URINARY PROBLEMS (pain or burning when urinating, or frequent urination) *BOWEL PROBLEMS (unusual diarrhea, constipation, pain near the anus) TENDERNESS IN MOUTH AND THROAT WITH OR WITHOUT PRESENCE OF ULCERS (sore throat, sores in mouth, or a toothache) UNUSUAL RASH, SWELLING OR PAIN  UNUSUAL VAGINAL DISCHARGE OR ITCHING   Items with * indicate a potential emergency and should be followed up as soon as possible or go to the Emergency Department if any problems should occur.  Please show the CHEMOTHERAPY ALERT CARD or IMMUNOTHERAPY ALERT CARD at check-in to the Emergency Department and triage nurse.  Should you have questions after your visit or need to cancel or reschedule your appointment, please contact [facility] [place] - A DEPT OF [facility] [phone]  and follow the prompts.  Office hours are 8:00 a.m. to 4:30 p.m. Monday - Friday. Please note that voicemails left after 4:00 p.m. may not be returned until the following business day.  We are closed weekends and major holidays. You have  access to a nurse at all times for urgent questions. Please call the main number to the clinic [phone] and follow the prompts.  For any non-urgent questions, you may also contact your provider using MyChart. We now offer e-Visits for anyone 68 and older to request care online for non-urgent symptoms. For details visit [email or web address].   Also download the MyChart app! Go to the app store, search MyChart, open the app, select [facility], and log in with your MyChart username and password.

## 2024-01-17 ENCOUNTER — Telehealth: Payer: Self-pay

## 2024-01-17 NOTE — Telephone Encounter (Signed)
 Copied from CRM #[id number]. Topic: Clinical - Order For Equipment >> Jan 17, 2024  9:12 AM Debra Harris wrote: Reason for CRM:   Pt is contacting clinic concerning her ResMed 10 CPAP machine. She is having issues with air flow concerning the machine; however, is not due to for a new one through insurance.   She plans to buy one out of pocket; however, requires a prescription to purchase one. Requested if the prescription could be sent to her email address on file.   Requested call back once script is sent, or if there needs to be another way to get to her  CB#  [phone]   Called and spoke to pt. Advised that as she has not been seen in over one year, we would have to have an appointment. Pt expressed her frustration. Pt agreed to a virtual appt with Debra Harris on 11/21 at 1:30.  I sent a message to the front desk staff to get this pt scheduled, as Epic would not let me book this pt's virtual spot. NFN.

## 2024-01-18 ENCOUNTER — Telehealth (INDEPENDENT_AMBULATORY_CARE_PROVIDER_SITE_OTHER): Admitting: Adult Health

## 2024-01-18 ENCOUNTER — Encounter: Payer: Self-pay | Admitting: Adult Health

## 2024-01-18 DIAGNOSIS — G4733 Obstructive sleep apnea (adult) (pediatric): Secondary | ICD-10-CM | POA: Diagnosis not present

## 2024-01-18 NOTE — Progress Notes (Signed)
 Virtual Visit via Video Note  I connected with Debra Harris on 01/18/24 at  1:30 PM EST by a video enabled telemedicine application and verified that I am speaking with the correct person using two identifiers.  Location: Patient: Home  Provider: Office    I discussed the limitations of evaluation and management by telemedicine and the availability of in person appointments. The patient expressed understanding and agreed to proceed.  History of Present Illness: 73 year old female followed for severe OSA Former patient of Dr. Shellia    Patient has a obstructive sleep apnea.  Last office visit August 2024.  She is here for routine follow up.  Patient says overall she is doing well on CPAP.  She does use 2 CPAP machines.  She keeps 1 at each of her homes.  Her current ResMed AirSense 10 machine is not working properly and is causing her difficulty at nighttime and she wants a prescription to order CPAP online.  ResMed AirSense 11 CPAP machine is working well.  Download shows good compliance with daily average usage at 7.5 hours.  She is on auto CPAP 5 to 12 cm H2O.  AHI 0.4.  She says this machine is working very well she feels rested and feels that she benefits from CPAP.  She is currently only using a nasal mask.  Her DME is [employer].     Observations/Objective: PSG 11/25/10 >> AHI 68.6, SpO2 low 81%   Assessment and Plan: Severe obstructive sleep apnea with excellent control compliance on CPAP.  Continue on CPAP at bedtime wear all night long.  Patient education given on CPAP.  Order for new CPAP AirSense 11 sent to patient.  She is going to order online.  Plan  . Patient Instructions  Continue on CPAP at bedtime keep up the good work Wear CPAP all night long Continue with daily CPAP care Continue to work on healthy weight Order for new CPAP sent to your MyChart Follow-up in 1 year and as needed with Dr. Neda or Orlie NP     Follow Up Instructions:    I discussed the  assessment and treatment plan with the patient. The patient was provided an opportunity to ask questions and all were answered. The patient agreed with the plan and demonstrated an understanding of the instructions.   The patient was advised to call back or seek an in-person evaluation if the symptoms worsen or if the condition fails to improve as anticipated.  I provided 22  minutes of non-face-to-face time during this encounter.   Madelin Orlie, NP

## 2024-01-18 NOTE — Telephone Encounter (Signed)
 Sent pt's CPAP order through attachments in MyChart message. Also placed the original signed documents in the outgoing mail. Pt has been informed via phone call. NFN

## 2024-01-18 NOTE — Patient Instructions (Signed)
 Continue on CPAP at bedtime keep up the good work Wear CPAP all night long Continue with daily CPAP care Continue to work on healthy weight Order for new CPAP sent to your MyChart Follow-up in 1 year and as needed with Dr. Neda or Orlie NP

## 2024-01-21 DIAGNOSIS — M79661 Pain in right lower leg: Secondary | ICD-10-CM | POA: Diagnosis not present

## 2024-01-21 DIAGNOSIS — I10 Essential (primary) hypertension: Secondary | ICD-10-CM | POA: Diagnosis not present

## 2024-01-21 DIAGNOSIS — D509 Iron deficiency anemia, unspecified: Secondary | ICD-10-CM | POA: Diagnosis not present

## 2024-01-21 DIAGNOSIS — E669 Obesity, unspecified: Secondary | ICD-10-CM | POA: Diagnosis not present

## 2024-01-21 DIAGNOSIS — R2241 Localized swelling, mass and lump, right lower limb: Secondary | ICD-10-CM | POA: Diagnosis not present

## 2024-01-21 DIAGNOSIS — I2510 Atherosclerotic heart disease of native coronary artery without angina pectoris: Secondary | ICD-10-CM | POA: Diagnosis not present

## 2024-01-21 DIAGNOSIS — Z8679 Personal history of other diseases of the circulatory system: Secondary | ICD-10-CM | POA: Diagnosis not present

## 2024-01-21 DIAGNOSIS — Z79899 Other long term (current) drug therapy: Secondary | ICD-10-CM | POA: Diagnosis not present

## 2024-01-21 DIAGNOSIS — G4730 Sleep apnea, unspecified: Secondary | ICD-10-CM | POA: Diagnosis not present

## 2024-01-28 ENCOUNTER — Encounter: Admitting: Pulmonary Disease

## 2024-01-28 ENCOUNTER — Inpatient Hospital Stay

## 2024-01-29 DIAGNOSIS — I480 Paroxysmal atrial fibrillation: Secondary | ICD-10-CM | POA: Diagnosis not present

## 2024-01-29 DIAGNOSIS — R600 Localized edema: Secondary | ICD-10-CM | POA: Diagnosis not present

## 2024-01-29 DIAGNOSIS — R1013 Epigastric pain: Secondary | ICD-10-CM | POA: Diagnosis not present

## 2024-01-29 DIAGNOSIS — G4733 Obstructive sleep apnea (adult) (pediatric): Secondary | ICD-10-CM | POA: Diagnosis not present

## 2024-02-05 ENCOUNTER — Inpatient Hospital Stay: Attending: Physician Assistant

## 2024-02-05 VITALS — BP 125/86 | HR 56 | Temp 96.7°F | Resp 20

## 2024-02-05 DIAGNOSIS — K625 Hemorrhage of anus and rectum: Secondary | ICD-10-CM

## 2024-02-05 DIAGNOSIS — K922 Gastrointestinal hemorrhage, unspecified: Secondary | ICD-10-CM | POA: Diagnosis not present

## 2024-02-05 DIAGNOSIS — D500 Iron deficiency anemia secondary to blood loss (chronic): Secondary | ICD-10-CM | POA: Diagnosis present

## 2024-02-05 MED ORDER — SODIUM CHLORIDE 0.9 % IV SOLN
510.0000 mg | Freq: Once | INTRAVENOUS | Status: AC
  Administered 2024-02-05: 510 mg via INTRAVENOUS
  Filled 2024-02-05: qty 510

## 2024-02-05 MED ORDER — SODIUM CHLORIDE 0.9 % IV SOLN: Freq: Once | INTRAVENOUS | Status: AC

## 2024-02-05 NOTE — Progress Notes (Signed)
 Patient tolerated iron infusion with no complaints voiced.  Peripheral IV site clean and dry with good blood return noted before and after infusion. Pt observed for 30 minutes post iron without any complications.  VSS with discharge and left in satisfactory condition with no s/s of distress noted. All follow ups as scheduled.   Atwood Adcock

## 2024-02-06 ENCOUNTER — Ambulatory Visit: Admitting: Orthopedic Surgery

## 2024-02-06 ENCOUNTER — Encounter: Payer: Self-pay | Admitting: Orthopedic Surgery

## 2024-02-06 DIAGNOSIS — S46801D Unspecified injury of other muscles, fascia and tendons at shoulder and upper arm level, right arm, subsequent encounter: Secondary | ICD-10-CM | POA: Diagnosis not present

## 2024-02-06 DIAGNOSIS — S46011D Strain of muscle(s) and tendon(s) of the rotator cuff of right shoulder, subsequent encounter: Secondary | ICD-10-CM | POA: Diagnosis not present

## 2024-02-06 DIAGNOSIS — M25561 Pain in right knee: Secondary | ICD-10-CM | POA: Diagnosis not present

## 2024-02-06 DIAGNOSIS — M5489 Other dorsalgia: Secondary | ICD-10-CM

## 2024-02-06 DIAGNOSIS — G8929 Other chronic pain: Secondary | ICD-10-CM | POA: Diagnosis not present

## 2024-02-06 DIAGNOSIS — M546 Pain in thoracic spine: Secondary | ICD-10-CM

## 2024-02-06 MED ORDER — DICLOFENAC SODIUM 75 MG PO TBEC: 75.0000 mg | DELAYED_RELEASE_TABLET | Freq: Two times a day (BID) | ORAL | 2 refills | Status: AC

## 2024-02-06 NOTE — Progress Notes (Signed)
° ° °  02/06/2024   Chief Complaint  Patient presents with   Shoulder Pain    Right     Encounter Diagnoses  Name Primary?   Pain in thoracic spine    Chronic pain of right knee    Traumatic injury of right subscapularis tendon, subsequent encounter    Traumatic incomplete tear of right rotator cuff, subsequent encounter Yes    73 year old female traumatic injury right shoulder acute on chronic rotator cuff tear has improved significantly with physical therapy activity modification  Patient is having a lot of pain in multiple joints due to inflammation.  She was on an anti-inflammatory but was told she had to stop it secondary to anticoagulation medications related to stroke  However, those medications have been stopped  Right shoulder the patient can get the arm completely above her head without pain I think she has done well with therapy and strengthen the periscapular muscles and deltoid to allow her this type of motion in the setting of the injury that we saw and the chronic changes we saw on MRI    After discussion with the patient including discussing the risk of anti-inflammatories and heart disease and GI bleeding we have decided to start the patient on turmeric daily and to use diclofenac which she tolerated before on an as needed basis  She will monitor kidney function GI bleeding such as watching for blood in the stool    Hemoglobin is 12  BUN/creatinine are normal and GFR is greater than 60   Improved doing exercises with health and wellness good motion and increased strength     Encounter Diagnoses  Name Primary?   Pain in thoracic spine    Chronic pain of right knee    Traumatic injury of right subscapularis tendon, subsequent encounter    Traumatic incomplete tear of right rotator cuff, subsequent encounter    Other back pain, unspecified chronicity Yes    Meds ordered this encounter  Medications   diclofenac (VOLTAREN) 75 MG EC tablet    Sig: Take  1 tablet (75 mg total) by mouth 2 (two) times daily with a meal.    Dispense:  60 tablet    Refill:  2   Add turmeric daily take the diclofenac as needed

## 2024-02-06 NOTE — Progress Notes (Signed)
° ° °  02/06/2024   Chief Complaint  Patient presents with   Shoulder Pain    Right     Encounter Diagnoses  Name Primary?   Pain in thoracic spine    Chronic pain of right knee     What pharmacy do you use ? [facility]_  DOI/DOS/ Date:  07/04/23 (fell)  Did you get better, worse or no change (Answer below)   Improved doing exercises with health and wellness good motion and increased strength   Has a lot of soreness/ stiffness since not able to use NSAIDS due to Afib

## 2024-02-06 NOTE — Patient Instructions (Signed)
 We can start turmeric daily and then take diclofenac as needed  We have discussed the risks of anti-inflammatories in the setting of heart disease Watch for any GI bleeding such as blood in the stool Monitor kidney function

## 2024-02-25 ENCOUNTER — Encounter: Payer: Self-pay | Admitting: *Deleted

## 2024-07-02 ENCOUNTER — Inpatient Hospital Stay

## 2024-07-09 ENCOUNTER — Inpatient Hospital Stay: Admitting: Physician Assistant
